# Patient Record
Sex: Female | Born: 1940
Health system: Southern US, Community
[De-identification: ages and names within clinical notes are randomized; demographics above are authoritative.]

## PROBLEM LIST (undated history)

## (undated) DIAGNOSIS — I82409 Acute embolism and thrombosis of unspecified deep veins of unspecified lower extremity: Secondary | ICD-10-CM

## (undated) DIAGNOSIS — Z9889 Other specified postprocedural states: Secondary | ICD-10-CM

## (undated) DIAGNOSIS — R600 Localized edema: Secondary | ICD-10-CM

## (undated) DIAGNOSIS — M810 Age-related osteoporosis without current pathological fracture: Secondary | ICD-10-CM

## (undated) DIAGNOSIS — I10 Essential (primary) hypertension: Secondary | ICD-10-CM

## (undated) DIAGNOSIS — E559 Vitamin D deficiency, unspecified: Secondary | ICD-10-CM

## (undated) DIAGNOSIS — H269 Unspecified cataract: Secondary | ICD-10-CM

## (undated) DIAGNOSIS — F419 Anxiety disorder, unspecified: Secondary | ICD-10-CM

## (undated) HISTORY — DX: Acute embolism and thrombosis of unspecified deep veins of unspecified lower extremity: I82.409

## (undated) HISTORY — PX: CATARACT EXTRACTION: SUR2

## (undated) HISTORY — DX: Vitamin D deficiency, unspecified: E55.9

## (undated) HISTORY — DX: Unspecified cataract: H26.9

## (undated) HISTORY — PX: ABDOMINAL HYSTERECTOMY: SHX81

## (undated) HISTORY — DX: Anxiety disorder, unspecified: F41.9

## (undated) HISTORY — DX: Essential (primary) hypertension: I10

## (undated) HISTORY — DX: Localized edema: R60.0

## (undated) HISTORY — DX: Age-related osteoporosis without current pathological fracture: M81.0

---

## 1998-04-07 ENCOUNTER — Emergency Department (HOSPITAL_COMMUNITY): Admission: EM | Admit: 1998-04-07 | Discharge: 1998-04-07 | Payer: Self-pay | Admitting: Emergency Medicine

## 1998-10-08 ENCOUNTER — Other Ambulatory Visit: Admission: RE | Admit: 1998-10-08 | Discharge: 1998-10-08 | Payer: Self-pay | Admitting: Obstetrics and Gynecology

## 1998-12-06 ENCOUNTER — Ambulatory Visit (HOSPITAL_COMMUNITY): Admission: RE | Admit: 1998-12-06 | Discharge: 1998-12-06 | Payer: Self-pay | Admitting: Internal Medicine

## 1998-12-06 ENCOUNTER — Encounter: Payer: Self-pay | Admitting: Internal Medicine

## 1998-12-28 ENCOUNTER — Ambulatory Visit (HOSPITAL_COMMUNITY): Admission: RE | Admit: 1998-12-28 | Discharge: 1998-12-28 | Payer: Self-pay | Admitting: Internal Medicine

## 1998-12-28 ENCOUNTER — Encounter: Payer: Self-pay | Admitting: Internal Medicine

## 1999-10-07 ENCOUNTER — Other Ambulatory Visit: Admission: RE | Admit: 1999-10-07 | Discharge: 1999-10-07 | Payer: Self-pay | Admitting: Obstetrics and Gynecology

## 2000-02-13 ENCOUNTER — Encounter: Payer: Self-pay | Admitting: Obstetrics and Gynecology

## 2000-02-13 ENCOUNTER — Encounter: Admission: RE | Admit: 2000-02-13 | Discharge: 2000-02-13 | Payer: Self-pay | Admitting: Obstetrics and Gynecology

## 2000-04-22 ENCOUNTER — Encounter: Payer: Self-pay | Admitting: Emergency Medicine

## 2000-04-22 ENCOUNTER — Emergency Department (HOSPITAL_COMMUNITY): Admission: EM | Admit: 2000-04-22 | Discharge: 2000-04-22 | Payer: Self-pay | Admitting: Emergency Medicine

## 2001-01-19 ENCOUNTER — Encounter: Admission: RE | Admit: 2001-01-19 | Discharge: 2001-01-19 | Payer: Self-pay | Admitting: Obstetrics & Gynecology

## 2001-02-02 ENCOUNTER — Encounter: Admission: RE | Admit: 2001-02-02 | Discharge: 2001-02-02 | Payer: Self-pay | Admitting: Hematology and Oncology

## 2001-02-15 ENCOUNTER — Ambulatory Visit (HOSPITAL_COMMUNITY): Admission: RE | Admit: 2001-02-15 | Discharge: 2001-02-15 | Payer: Self-pay | Admitting: Obstetrics & Gynecology

## 2001-02-22 ENCOUNTER — Encounter: Admission: RE | Admit: 2001-02-22 | Discharge: 2001-02-22 | Payer: Self-pay | Admitting: Internal Medicine

## 2001-03-16 ENCOUNTER — Encounter: Admission: RE | Admit: 2001-03-16 | Discharge: 2001-03-16 | Payer: Self-pay | Admitting: Internal Medicine

## 2001-04-27 ENCOUNTER — Encounter: Admission: RE | Admit: 2001-04-27 | Discharge: 2001-04-27 | Payer: Self-pay | Admitting: Internal Medicine

## 2001-05-27 ENCOUNTER — Encounter: Admission: RE | Admit: 2001-05-27 | Discharge: 2001-05-27 | Payer: Self-pay | Admitting: Internal Medicine

## 2001-06-24 ENCOUNTER — Encounter: Admission: RE | Admit: 2001-06-24 | Discharge: 2001-06-24 | Payer: Self-pay | Admitting: Internal Medicine

## 2001-08-24 ENCOUNTER — Encounter: Admission: RE | Admit: 2001-08-24 | Discharge: 2001-08-24 | Payer: Self-pay | Admitting: Internal Medicine

## 2001-12-23 ENCOUNTER — Encounter: Payer: Self-pay | Admitting: Internal Medicine

## 2001-12-23 ENCOUNTER — Ambulatory Visit (HOSPITAL_COMMUNITY): Admission: RE | Admit: 2001-12-23 | Discharge: 2001-12-23 | Payer: Self-pay | Admitting: Internal Medicine

## 2001-12-23 ENCOUNTER — Encounter: Admission: RE | Admit: 2001-12-23 | Discharge: 2001-12-23 | Payer: Self-pay | Admitting: *Deleted

## 2002-03-15 ENCOUNTER — Encounter: Admission: RE | Admit: 2002-03-15 | Discharge: 2002-03-15 | Payer: Self-pay | Admitting: Internal Medicine

## 2002-03-24 ENCOUNTER — Ambulatory Visit (HOSPITAL_COMMUNITY): Admission: RE | Admit: 2002-03-24 | Discharge: 2002-03-24 | Payer: Self-pay | Admitting: Obstetrics

## 2002-05-23 ENCOUNTER — Encounter: Admission: RE | Admit: 2002-05-23 | Discharge: 2002-05-23 | Payer: Self-pay | Admitting: Internal Medicine

## 2004-09-18 ENCOUNTER — Ambulatory Visit: Admission: RE | Admit: 2004-09-18 | Discharge: 2004-09-18 | Payer: Self-pay | Admitting: Endocrinology

## 2004-09-18 ENCOUNTER — Ambulatory Visit: Payer: Self-pay | Admitting: Endocrinology

## 2004-09-30 ENCOUNTER — Ambulatory Visit: Payer: Self-pay | Admitting: Internal Medicine

## 2004-10-17 ENCOUNTER — Ambulatory Visit: Payer: Self-pay | Admitting: Internal Medicine

## 2004-12-23 ENCOUNTER — Ambulatory Visit: Payer: Self-pay | Admitting: Internal Medicine

## 2007-09-09 ENCOUNTER — Telehealth: Payer: Self-pay | Admitting: Internal Medicine

## 2007-12-16 ENCOUNTER — Encounter: Payer: Self-pay | Admitting: Internal Medicine

## 2007-12-16 DIAGNOSIS — I10 Essential (primary) hypertension: Secondary | ICD-10-CM | POA: Insufficient documentation

## 2007-12-16 DIAGNOSIS — R51 Headache: Secondary | ICD-10-CM | POA: Insufficient documentation

## 2007-12-16 DIAGNOSIS — R519 Headache, unspecified: Secondary | ICD-10-CM | POA: Insufficient documentation

## 2007-12-17 ENCOUNTER — Ambulatory Visit: Payer: Self-pay | Admitting: Internal Medicine

## 2007-12-17 LAB — CONVERTED CEMR LAB
Creatinine, Ser: 0.9 mg/dL (ref 0.4–1.2)
Potassium: 3.9 meq/L (ref 3.5–5.1)

## 2007-12-21 ENCOUNTER — Encounter: Payer: Self-pay | Admitting: Internal Medicine

## 2008-04-11 ENCOUNTER — Emergency Department (HOSPITAL_COMMUNITY): Admission: EM | Admit: 2008-04-11 | Discharge: 2008-04-12 | Payer: Self-pay | Admitting: Emergency Medicine

## 2008-04-24 ENCOUNTER — Ambulatory Visit: Payer: Self-pay | Admitting: Internal Medicine

## 2008-04-24 DIAGNOSIS — I471 Supraventricular tachycardia: Secondary | ICD-10-CM | POA: Insufficient documentation

## 2008-04-24 DIAGNOSIS — R7989 Other specified abnormal findings of blood chemistry: Secondary | ICD-10-CM | POA: Insufficient documentation

## 2008-08-28 ENCOUNTER — Ambulatory Visit: Payer: Self-pay | Admitting: Internal Medicine

## 2008-08-28 DIAGNOSIS — M810 Age-related osteoporosis without current pathological fracture: Secondary | ICD-10-CM | POA: Insufficient documentation

## 2008-08-28 DIAGNOSIS — M1990 Unspecified osteoarthritis, unspecified site: Secondary | ICD-10-CM | POA: Insufficient documentation

## 2008-08-28 DIAGNOSIS — J309 Allergic rhinitis, unspecified: Secondary | ICD-10-CM | POA: Insufficient documentation

## 2008-08-29 ENCOUNTER — Encounter (INDEPENDENT_AMBULATORY_CARE_PROVIDER_SITE_OTHER): Payer: Self-pay | Admitting: Internal Medicine

## 2008-08-30 ENCOUNTER — Encounter (INDEPENDENT_AMBULATORY_CARE_PROVIDER_SITE_OTHER): Payer: Self-pay | Admitting: Internal Medicine

## 2008-08-31 LAB — CONVERTED CEMR LAB
ALT: 17 units/L (ref 0–35)
AST: 17 units/L (ref 0–37)
CO2: 24 meq/L (ref 19–32)
Calcium: 9.7 mg/dL (ref 8.4–10.5)
Chloride: 102 meq/L (ref 96–112)
Cholesterol: 216 mg/dL — ABNORMAL HIGH (ref 0–200)
Creatinine, Ser: 0.9 mg/dL (ref 0.40–1.20)
Potassium: 4 meq/L (ref 3.5–5.3)
Sodium: 141 meq/L (ref 135–145)
Total CHOL/HDL Ratio: 4.5
Total Protein: 7.3 g/dL (ref 6.0–8.3)

## 2008-09-01 ENCOUNTER — Ambulatory Visit (HOSPITAL_COMMUNITY): Admission: RE | Admit: 2008-09-01 | Discharge: 2008-09-01 | Payer: Self-pay | Admitting: Internal Medicine

## 2008-09-01 ENCOUNTER — Encounter (INDEPENDENT_AMBULATORY_CARE_PROVIDER_SITE_OTHER): Payer: Self-pay | Admitting: Internal Medicine

## 2008-09-08 ENCOUNTER — Ambulatory Visit: Payer: Self-pay | Admitting: Internal Medicine

## 2008-10-10 ENCOUNTER — Ambulatory Visit (HOSPITAL_COMMUNITY): Admission: RE | Admit: 2008-10-10 | Discharge: 2008-10-10 | Payer: Self-pay | Admitting: Internal Medicine

## 2008-10-10 ENCOUNTER — Ambulatory Visit: Payer: Self-pay | Admitting: Internal Medicine

## 2008-10-10 HISTORY — PX: COLONOSCOPY: SHX174

## 2008-10-11 ENCOUNTER — Ambulatory Visit (HOSPITAL_COMMUNITY): Admission: RE | Admit: 2008-10-11 | Discharge: 2008-10-11 | Payer: Self-pay | Admitting: Internal Medicine

## 2009-01-18 ENCOUNTER — Telehealth (INDEPENDENT_AMBULATORY_CARE_PROVIDER_SITE_OTHER): Payer: Self-pay | Admitting: Internal Medicine

## 2009-01-18 ENCOUNTER — Emergency Department (HOSPITAL_COMMUNITY): Admission: EM | Admit: 2009-01-18 | Discharge: 2009-01-18 | Payer: Self-pay | Admitting: Emergency Medicine

## 2009-05-17 ENCOUNTER — Telehealth (INDEPENDENT_AMBULATORY_CARE_PROVIDER_SITE_OTHER): Payer: Self-pay | Admitting: *Deleted

## 2009-05-18 ENCOUNTER — Ambulatory Visit: Payer: Self-pay | Admitting: Internal Medicine

## 2009-05-18 DIAGNOSIS — E785 Hyperlipidemia, unspecified: Secondary | ICD-10-CM | POA: Insufficient documentation

## 2009-05-18 DIAGNOSIS — M542 Cervicalgia: Secondary | ICD-10-CM | POA: Insufficient documentation

## 2009-05-18 DIAGNOSIS — F411 Generalized anxiety disorder: Secondary | ICD-10-CM | POA: Insufficient documentation

## 2009-05-23 LAB — CONVERTED CEMR LAB
Chloride: 104 meq/L (ref 96–112)
Cholesterol: 216 mg/dL — ABNORMAL HIGH (ref 0–200)
Glucose, Bld: 89 mg/dL (ref 70–99)
LDL Cholesterol: 147 mg/dL — ABNORMAL HIGH (ref 0–99)
Potassium: 3.8 meq/L (ref 3.5–5.3)
Sodium: 142 meq/L (ref 135–145)
Total CHOL/HDL Ratio: 4.9
Triglycerides: 125 mg/dL (ref <150)
VLDL: 25 mg/dL (ref 0–40)

## 2009-07-04 ENCOUNTER — Encounter (INDEPENDENT_AMBULATORY_CARE_PROVIDER_SITE_OTHER): Payer: Self-pay | Admitting: Internal Medicine

## 2009-09-10 ENCOUNTER — Ambulatory Visit (HOSPITAL_COMMUNITY): Admission: RE | Admit: 2009-09-10 | Discharge: 2009-09-10 | Payer: Self-pay | Admitting: Internal Medicine

## 2010-09-02 ENCOUNTER — Ambulatory Visit (HOSPITAL_COMMUNITY): Admission: RE | Admit: 2010-09-02 | Discharge: 2010-09-02 | Payer: Self-pay | Admitting: Family Medicine

## 2010-09-12 ENCOUNTER — Ambulatory Visit (HOSPITAL_COMMUNITY): Admission: RE | Admit: 2010-09-12 | Discharge: 2010-09-12 | Payer: Self-pay | Admitting: Family Medicine

## 2010-11-17 ENCOUNTER — Encounter: Payer: Self-pay | Admitting: Internal Medicine

## 2011-02-06 LAB — BASIC METABOLIC PANEL
BUN: 11 mg/dL (ref 6–23)
Calcium: 9.3 mg/dL (ref 8.4–10.5)
GFR calc non Af Amer: >60 mL/min (ref >60)
Glucose, Bld: 114 mg/dL — ABNORMAL HIGH (ref 70–99)
Sodium: 138 mEq/L (ref 135–145)

## 2011-02-06 LAB — CBC
Platelets: 213 10*3/uL (ref 150–400)
RDW: 13.9 % (ref 11.5–15.5)
WBC: 7.7 10*3/uL (ref 4.0–10.5)

## 2011-02-06 LAB — DIFFERENTIAL
Basophils Absolute: 0 10*3/uL (ref 0.0–0.1)
Lymphocytes Relative: 41 % (ref 12–46)
Lymphs Abs: 3.1 10*3/uL (ref 0.7–4.0)
Neutro Abs: 3.8 10*3/uL (ref 1.7–7.7)

## 2011-02-19 ENCOUNTER — Emergency Department (HOSPITAL_COMMUNITY)
Admission: EM | Admit: 2011-02-19 | Discharge: 2011-02-19 | Disposition: A | Discharge status: Home / Self Care | Payer: Medicare Other | Attending: Emergency Medicine | Admitting: Emergency Medicine

## 2011-02-19 ENCOUNTER — Emergency Department (HOSPITAL_COMMUNITY): Payer: Medicare Other

## 2011-02-19 DIAGNOSIS — IMO0002 Reserved for concepts with insufficient information to code with codable children: Secondary | ICD-10-CM | POA: Insufficient documentation

## 2011-02-19 DIAGNOSIS — I10 Essential (primary) hypertension: Secondary | ICD-10-CM | POA: Insufficient documentation

## 2011-02-19 DIAGNOSIS — T18108A Unspecified foreign body in esophagus causing other injury, initial encounter: Secondary | ICD-10-CM | POA: Insufficient documentation

## 2011-03-11 NOTE — Op Note (Signed)
NAME:  BENNIE, SCAFF               ACCOUNT NO.:  [id number]   MEDICAL RECORD NO.:  [id number]          PATIENT TYPE:  AMB   LOCATION:  DAY                           FACILITY:  [facility]   PHYSICIAN:  R. Roetta Sessions, M.D. DATE OF BIRTH:  Jun 10, 1941   DATE OF PROCEDURE:  10/10/2008  DATE OF DISCHARGE:                               OPERATIVE REPORT   INDICATIONS FOR PROCEDURE:  A 70 year old lady with no lower GI tract  symptoms sent over courtesy, Dr. Jen Mow for screening colonoscopy.  She  has never had her lower GI tract evaluated.  There is no family history  of first relatives with colon cancer.  Colonoscopy is now being done as  screening maneuver.  Risks, benefits, alternatives, and limitations have  been reviewed, questions answered.  Please see the documentation in the  medical record.   PROCEDURE NOTE:  O2 saturation, blood pressure, pulse, respirations were  monitored throughout the entire procedure.   CONSCIOUS SEDATION:  Versed 4 mg IV, Demerol 100 mg IV in divided doses.   INSTRUMENT:  Pentax video chip system.   FINDINGS:  Digital rectal exam revealed no abnormalities.  Endoscopic  Findings:  Prep was good.  Colon:  Colonic mucosa was surveyed from the  rectosigmoid junction well into the left colon, right after that had  some difficulties encountering looping, and poor patient tolerance  throughout the entirety of the procedure on top of looping, Ms. Ho  was very susceptible to bradycardia and her blood pressure did  transiently drop down into the 60s and she got bradycardic in the 40s.  Every time I attempted to advance the scope every time I backed off.  Her vital signs rapidly normalized.  She was having quite a bit of  intolerance to the procedure, although as I encountered looping I backed  off.  I felt it would not be safe to continue attempting advancing to  the cecum and consequently I backed off.  I feel that a good portion of  descending and sigmoid segments  were well seen and appeared entirely  normal.  I pulled the scope down into the rectum.  Thorough examination  of the rectal mucosa including retroflexed view of the anal verge  demonstrated no abnormalities.  The patient was stable and doing fine at  the conclusion of the procedure.   IMPRESSION:  Incomplete exam as described above.   Recommendations will complement today's incomplete exam with near  contrast barium enema at a later date.      Jonathon Bellows, M.D.  Electronically Signed    RMR/MEDQ  D:  10/10/2008  T:  10/10/2008  Job:  [id number]   cc:   Erle Crocker, M.D.

## 2011-05-14 ENCOUNTER — Ambulatory Visit
Admission: RE | Admit: 2011-05-14 | Discharge: 2011-05-14 | Disposition: A | Discharge status: Home / Self Care | Payer: Medicare Other | Source: Ambulatory Visit | Attending: Physician Assistant | Admitting: Physician Assistant

## 2011-05-14 ENCOUNTER — Ambulatory Visit
Admission: RE | Admit: 2011-05-14 | Discharge: 2011-05-14 | Disposition: A | Discharge status: Home / Self Care | Payer: Medicare Other | Source: Ambulatory Visit | Attending: Family Medicine | Admitting: Family Medicine

## 2011-05-14 ENCOUNTER — Other Ambulatory Visit: Payer: Self-pay | Admitting: Family Medicine

## 2011-05-14 ENCOUNTER — Other Ambulatory Visit: Payer: Self-pay | Admitting: Physician Assistant

## 2011-05-14 DIAGNOSIS — R609 Edema, unspecified: Secondary | ICD-10-CM

## 2011-05-14 DIAGNOSIS — R52 Pain, unspecified: Secondary | ICD-10-CM

## 2011-07-24 LAB — DIFFERENTIAL
Eosinophils Absolute: 0.1
Lymphs Abs: 5 — ABNORMAL HIGH
Monocytes Absolute: 0.9
Monocytes Relative: 8
Neutrophils Relative %: 47

## 2011-07-24 LAB — CBC
HCT: 37.4
Hemoglobin: 13
MCV: 88.1
RBC: 4.25
WBC: 11.6 — ABNORMAL HIGH

## 2011-07-24 LAB — BASIC METABOLIC PANEL
Chloride: 102
GFR calc Af Amer: 52 — ABNORMAL LOW
Potassium: 2.6 — CL
Sodium: 137

## 2011-07-24 LAB — POCT CARDIAC MARKERS: Myoglobin, poc: 18.1

## 2011-08-07 ENCOUNTER — Other Ambulatory Visit: Payer: Self-pay | Admitting: Family Medicine

## 2011-08-07 DIAGNOSIS — Z139 Encounter for screening, unspecified: Secondary | ICD-10-CM

## 2011-09-15 ENCOUNTER — Ambulatory Visit (HOSPITAL_COMMUNITY): Payer: Medicare Other

## 2011-09-15 ENCOUNTER — Ambulatory Visit (HOSPITAL_COMMUNITY)
Admission: RE | Admit: 2011-09-15 | Discharge: 2011-09-15 | Disposition: A | Discharge status: Home / Self Care | Payer: Medicare Other | Source: Ambulatory Visit | Attending: Family Medicine | Admitting: Family Medicine

## 2011-09-15 DIAGNOSIS — Z1231 Encounter for screening mammogram for malignant neoplasm of breast: Secondary | ICD-10-CM | POA: Insufficient documentation

## 2011-09-15 DIAGNOSIS — Z139 Encounter for screening, unspecified: Secondary | ICD-10-CM

## 2011-12-17 ENCOUNTER — Other Ambulatory Visit: Payer: Self-pay | Admitting: Physician Assistant

## 2011-12-17 DIAGNOSIS — R2230 Localized swelling, mass and lump, unspecified upper limb: Secondary | ICD-10-CM

## 2011-12-31 ENCOUNTER — Ambulatory Visit (HOSPITAL_COMMUNITY)
Admission: RE | Admit: 2011-12-31 | Discharge: 2011-12-31 | Disposition: A | Discharge status: Home / Self Care | Payer: Medicare Other | Source: Ambulatory Visit | Attending: Physician Assistant | Admitting: Physician Assistant

## 2011-12-31 DIAGNOSIS — N644 Mastodynia: Secondary | ICD-10-CM | POA: Insufficient documentation

## 2011-12-31 DIAGNOSIS — N63 Unspecified lump in unspecified breast: Secondary | ICD-10-CM | POA: Insufficient documentation

## 2011-12-31 DIAGNOSIS — R2230 Localized swelling, mass and lump, unspecified upper limb: Secondary | ICD-10-CM

## 2012-06-30 ENCOUNTER — Other Ambulatory Visit: Payer: Self-pay | Admitting: Family Medicine

## 2012-06-30 DIAGNOSIS — M810 Age-related osteoporosis without current pathological fracture: Secondary | ICD-10-CM

## 2012-08-31 ENCOUNTER — Ambulatory Visit (HOSPITAL_COMMUNITY): Payer: Medicare Other

## 2012-08-31 ENCOUNTER — Other Ambulatory Visit (HOSPITAL_COMMUNITY): Payer: Medicare Other

## 2012-09-16 ENCOUNTER — Ambulatory Visit (HOSPITAL_COMMUNITY)
Admission: RE | Admit: 2012-09-16 | Discharge: 2012-09-16 | Disposition: A | Discharge status: Home / Self Care | Payer: Medicare Other | Source: Ambulatory Visit | Attending: Family Medicine | Admitting: Family Medicine

## 2012-09-16 DIAGNOSIS — M810 Age-related osteoporosis without current pathological fracture: Secondary | ICD-10-CM

## 2012-09-16 DIAGNOSIS — Z1231 Encounter for screening mammogram for malignant neoplasm of breast: Secondary | ICD-10-CM | POA: Insufficient documentation

## 2012-09-16 LAB — HM MAMMOGRAPHY

## 2012-09-16 LAB — HM DEXA SCAN

## 2012-12-25 ENCOUNTER — Encounter: Payer: Self-pay | Admitting: *Deleted

## 2012-12-25 DIAGNOSIS — I10 Essential (primary) hypertension: Secondary | ICD-10-CM | POA: Insufficient documentation

## 2012-12-25 DIAGNOSIS — F419 Anxiety disorder, unspecified: Secondary | ICD-10-CM | POA: Insufficient documentation

## 2012-12-25 DIAGNOSIS — H269 Unspecified cataract: Secondary | ICD-10-CM | POA: Insufficient documentation

## 2012-12-25 DIAGNOSIS — M810 Age-related osteoporosis without current pathological fracture: Secondary | ICD-10-CM | POA: Insufficient documentation

## 2013-03-22 ENCOUNTER — Encounter: Payer: Self-pay | Admitting: Physician Assistant

## 2013-06-16 ENCOUNTER — Encounter: Payer: Self-pay | Admitting: Physician Assistant

## 2013-06-16 ENCOUNTER — Ambulatory Visit (INDEPENDENT_AMBULATORY_CARE_PROVIDER_SITE_OTHER): Payer: Medicare Other | Admitting: Physician Assistant

## 2013-06-16 VITALS — BP 124/80 | HR 54 | Temp 97.1°F | Resp 16 | Ht 66.25 in | Wt 170.0 lb

## 2013-06-16 DIAGNOSIS — F411 Generalized anxiety disorder: Secondary | ICD-10-CM

## 2013-06-16 DIAGNOSIS — M1990 Unspecified osteoarthritis, unspecified site: Secondary | ICD-10-CM

## 2013-06-16 DIAGNOSIS — I10 Essential (primary) hypertension: Secondary | ICD-10-CM

## 2013-06-16 DIAGNOSIS — M810 Age-related osteoporosis without current pathological fracture: Secondary | ICD-10-CM

## 2013-06-16 DIAGNOSIS — E559 Vitamin D deficiency, unspecified: Secondary | ICD-10-CM | POA: Insufficient documentation

## 2013-06-16 LAB — COMPLETE METABOLIC PANEL WITH GFR
AST: 14 U/L (ref 0–37)
BUN: 16 mg/dL (ref 6–23)
Calcium: 9.4 mg/dL (ref 8.4–10.5)
Chloride: 105 mEq/L (ref 96–112)
Creat: 0.89 mg/dL (ref 0.50–1.10)
GFR, Est African American: 76 mL/min
Total Bilirubin: 0.6 mg/dL (ref 0.3–1.2)

## 2013-06-16 NOTE — Progress Notes (Signed)
Patient ID: KASUMI DITULLIO MRN: [id number], DOB: [birth year], 72 y.o. Date of Encounter: @DATE @  Chief Complaint:  Chief Complaint  Patient presents with  . Follow-up    HPI: 72 y.o. year old white female  presents 4 routine followup office visit.  Osteoporosis: She is currently still taking the Fosamax weekly and also taking calcium and vitamin D. She still is having the jaw pain and no thigh pain.  Generalized anxiety disorder: He is still on Celexa for this. This is still working very well for her in controlling her symptoms. She is having no adverse effects. She says that she has "always been a very nervous person" but the Celexa controls this very well.  She has no complaints today. Even with exertion she has no chest pressure tightness or heaviness and no increased work of breath/dyspnea on exertion.   Past Medical History  Diagnosis Date  . Hypertension   . Anxiety   . Osteoporosis   . Edema of both legs     at end of day  . Cataract   . Vitamin D deficiency      Home Meds: See attached medication section for current medication list. Any medications entered into computer today will not appear on this note's list. The medications listed below were entered prior to today. Current Outpatient Prescriptions on File Prior to Visit  Medication Sig Dispense Refill  . alendronate (FOSAMAX) 10 MG tablet Take 10 mg by mouth once a week. Take with a full glass of water on an empty stomach.      . calcium carbonate (OS-CAL) 600 MG TABS Take 600 mg by mouth 2 (two) times daily with a meal.      . cholecalciferol (VITAMIN D) 1000 UNITS tablet Take 2,000 Units by mouth daily.      . citalopram (CELEXA) 20 MG tablet Take 20 mg by mouth daily.      . hydrochlorothiazide (HYDRODIURIL) 25 MG tablet Take 25 mg by mouth daily.       No current facility-administered medications on file prior to visit.    Allergies: No Known Allergies  History   Social History  . Marital Status:  Married    Spouse Name: N/A    Number of Children: N/A  . Years of Education: N/A   Occupational History  . Not on file.   Social History Main Topics  . Smoking status: Never Smoker   . Smokeless tobacco: Not on file  . Alcohol Use: No  . Drug Use: Not on file  . Sexual Activity: Not on file   Other Topics Concern  . Not on file   Social History Narrative  . No narrative on file    Family History  Problem Relation Age of Onset  . Cancer Mother     Breast  . Heart disease Father 48    CABG  . Cancer Sister 47    Lung  . Cancer Brother 70     Review of Systems:  See HPI for pertinent ROS. All other ROS negative.    Physical Exam: Blood pressure 124/80, pulse 54, temperature 97.1 F (36.2 C), resp. rate 16, height 5' 6.25" (1.683 m), weight 170 lb (77.111 kg)., Body mass index is 27.22 kg/(m^2). General:well-nourished well-developed white female Appears in no acute distress. Neck: Supple. No thyromegaly. No lymphadenopathy.no carotid bruit. Lungs: Clear bilaterally to auscultation without wheezes, rales, or rhonchi. Breathing is unlabored. Heart: RRR with S1 S2. No murmurs, rubs, or gallops. Abdomen: Soft,  non-tender, non-distended with normoactive bowel sounds. No hepatomegaly. No rebound/guarding. No obvious abdominal masses. Musculoskeletal:  Strength and tone normal for age. Extremities/Skin: Warm and dry. No clubbing or cyanosis. No edema. No rashes or suspicious lesions. Neuro: Alert and oriented X 3. Moves all extremities spontaneously. Gait is normal. CNII-XII grossly in tact. Psych:  Responds to questions appropriately with a normal affect.     ASSESSMENT AND PLAN:  72 y.o. year old female with  1. ANXIETY Controlled continue current dose of Celexa. - COMPLETE METABOLIC PANEL WITH GFR  2. HYPERTENSION At goal continue current medication. - COMPLETE METABOLIC PANEL WITH GFR  3. POSTMENOPAUSAL OSTEOPOROSIS Discussed the studies regarding Fosamax.  Discussed that the medication should be stopped after being on the therapy for 5 years. She started Fosamax in November 2009. This means this needs to be conducted discontinued November 2014. She says that she has 2 months of medication remaining that she is always already purchased. She is quite willing to complete these and I would not pick up any further refills of her Fosamax. Her last DEXA scan was in November 2013 which was improved.   4. OSTEOARTHRITIS   5. Osteoporosis  - Vit D  25 hydroxy (rtn osteoporosis monitoring)  6. Vitamin D deficiency 1 2000 international units daily. - Vit D  25 hydroxy (rtn osteoporosis monitoring)  #7 mammogram patient reports that she had this done November 2013 and was normal.  #8 colonoscopy per patient she had this in approximately 2010 by Dr. Work and was told to followup in 10 years  #9 screening labs: Performed August 2011 with favorable FLP  #10 immunizations A T. With insurance the cost would be $40. She defers B. Pneumovax given 06/16/2012 Zostavax even with insurance her cost is too expensive and she defers this.   [address] [place], [place], [facility] 06/16/2013 5:35 PM

## 2013-06-17 ENCOUNTER — Ambulatory Visit: Payer: Self-pay | Admitting: Physician Assistant

## 2013-06-17 LAB — VITAMIN D 25 HYDROXY (VIT D DEFICIENCY, FRACTURES): Vit D, 25-Hydroxy: 63 ng/mL (ref 30–89)

## 2013-08-17 ENCOUNTER — Other Ambulatory Visit: Payer: Self-pay | Admitting: Physician Assistant

## 2013-08-17 DIAGNOSIS — Z139 Encounter for screening, unspecified: Secondary | ICD-10-CM

## 2013-09-19 ENCOUNTER — Ambulatory Visit (INDEPENDENT_AMBULATORY_CARE_PROVIDER_SITE_OTHER): Payer: Medicare Other | Admitting: Family Medicine

## 2013-09-19 DIAGNOSIS — Z23 Encounter for immunization: Secondary | ICD-10-CM

## 2013-09-20 ENCOUNTER — Ambulatory Visit (HOSPITAL_COMMUNITY)
Admission: RE | Admit: 2013-09-20 | Discharge: 2013-09-20 | Disposition: A | Discharge status: Home / Self Care | Payer: Medicare Other | Source: Ambulatory Visit | Attending: Physician Assistant | Admitting: Physician Assistant

## 2013-09-20 DIAGNOSIS — Z1231 Encounter for screening mammogram for malignant neoplasm of breast: Secondary | ICD-10-CM | POA: Insufficient documentation

## 2013-09-20 DIAGNOSIS — Z139 Encounter for screening, unspecified: Secondary | ICD-10-CM

## 2013-11-08 ENCOUNTER — Encounter: Payer: Self-pay | Admitting: Physician Assistant

## 2013-11-08 ENCOUNTER — Ambulatory Visit (INDEPENDENT_AMBULATORY_CARE_PROVIDER_SITE_OTHER): Payer: Medicare Other | Admitting: Physician Assistant

## 2013-11-08 VITALS — BP 152/96 | HR 52 | Temp 98.1°F | Resp 18 | Wt 172.0 lb

## 2013-11-08 DIAGNOSIS — B9689 Other specified bacterial agents as the cause of diseases classified elsewhere: Secondary | ICD-10-CM

## 2013-11-08 DIAGNOSIS — J069 Acute upper respiratory infection, unspecified: Secondary | ICD-10-CM

## 2013-11-08 DIAGNOSIS — A499 Bacterial infection, unspecified: Secondary | ICD-10-CM

## 2013-11-08 MED ORDER — AMOXICILLIN-POT CLAVULANATE 875-125 MG PO TABS: 1.0000 | ORAL_TABLET | Freq: Two times a day (BID) | ORAL | Status: DC

## 2013-11-08 NOTE — Progress Notes (Signed)
    Patient ID: Wendy BeadyBarbara S Dargan MRN: [id number], DOB: [birth year], 73 y.o. Date of Encounter: 11/08/2013, 3:28 PM    Chief Complaint:  Chief Complaint  Patient presents with  . c/o sinus infection    x 5 days     HPI: 73 y.o. year old white female presents with above symptoms. Says she has had nasal and sinus congestion for 5 or 6 days now. Sometimes able to get out some mucus. Occasionally feels some discomfort into the ears. No sore throat. No chest congestion or cough. No fevers or chills.     Home Meds: See attached medication section for any medications that were entered at today's visit. The computer does not put those onto this list.The following list is a list of meds entered prior to today's visit.   Current Outpatient Prescriptions on File Prior to Visit  Medication Sig Dispense Refill  . calcium carbonate (OS-CAL) 600 MG TABS Take 600 mg by mouth 2 (two) times daily with a meal.      . cholecalciferol (VITAMIN D) 1000 UNITS tablet Take 2,000 Units by mouth daily.      . citalopram (CELEXA) 20 MG tablet Take 20 mg by mouth daily.      . hydrochlorothiazide (HYDRODIURIL) 25 MG tablet Take 25 mg by mouth daily.       No current facility-administered medications on file prior to visit.    Allergies: No Known Allergies    Review of Systems: See HPI for pertinent ROS. All other ROS negative.    Physical Exam: Blood pressure 152/96, pulse 52, temperature 98.1 F (36.7 C), temperature source Oral, resp. rate 18, weight 172 lb (78.019 kg)., Body mass index is 27.54 kg/(m^2). General: WNWD WF.  Appears in no acute distress. HEENT: Normocephalic, atraumatic, eyes without discharge, sclera non-icteric, nares are without discharge. Bilateral auditory canals clear, TM's are without perforation, pearly grey and translucent with reflective cone of light bilaterally. Oral cavity moist, posterior pharynx without exudate, erythema, peritonsillar abscess. Frontal and maxillary sinuses  with minimal tenderness with percussion.  Neck: Supple. No thyromegaly. No lymphadenopathy. Lungs: Clear bilaterally to auscultation without wheezes, rales, or rhonchi. Breathing is unlabored. Heart: Regular rhythm. No murmurs, rubs, or gallops. Msk:  Strength and tone normal for age. Extremities/Skin: Warm and dry. No clubbing or cyanosis. No edema. No rashes or suspicious lesions. Neuro: Alert and oriented X 3. Moves all extremities spontaneously. Gait is normal. CNII-XII grossly in tact. Psych:  Responds to questions appropriately with a normal affect.     ASSESSMENT AND PLAN:  73 y.o. year old female with  1. Bacterial upper respiratory infection - amoxicillin-clavulanate (AUGMENTIN) 875-125 MG per tablet; Take 1 tablet by mouth 2 (two) times daily.  Dispense: 20 tablet; Refill: 0 She is to complete all of antibiotic. If symptoms do not resolve after completion of antibiotic and followup. She can use over-the-counter medications as needed for symptom relief in the meantime.  Murray HodgkinsSigned,  Beth GoliadDixon, [place], [facility] 11/08/2013 3:28 PM

## 2013-12-19 ENCOUNTER — Encounter: Payer: Self-pay | Admitting: Physician Assistant

## 2013-12-19 ENCOUNTER — Ambulatory Visit (INDEPENDENT_AMBULATORY_CARE_PROVIDER_SITE_OTHER): Payer: Medicare Other | Admitting: Physician Assistant

## 2013-12-19 VITALS — BP 104/70 | HR 64 | Temp 98.2°F | Resp 18 | Ht 64.75 in | Wt 172.0 lb

## 2013-12-19 DIAGNOSIS — F411 Generalized anxiety disorder: Secondary | ICD-10-CM

## 2013-12-19 DIAGNOSIS — M810 Age-related osteoporosis without current pathological fracture: Secondary | ICD-10-CM

## 2013-12-19 DIAGNOSIS — E559 Vitamin D deficiency, unspecified: Secondary | ICD-10-CM

## 2013-12-19 DIAGNOSIS — I10 Essential (primary) hypertension: Secondary | ICD-10-CM

## 2013-12-19 LAB — BASIC METABOLIC PANEL WITH GFR
BUN: 18 mg/dL (ref 6–23)
CHLORIDE: 102 meq/L (ref 96–112)
CO2: 28 mEq/L (ref 19–32)
CREATININE: 0.94 mg/dL (ref 0.50–1.10)
Calcium: 9.5 mg/dL (ref 8.4–10.5)
GFR, EST NON AFRICAN AMERICAN: 62 mL/min
GFR, Est African American: 71 mL/min
GLUCOSE: 88 mg/dL (ref 70–99)
Potassium: 4.4 mEq/L (ref 3.5–5.3)
Sodium: 141 mEq/L (ref 135–145)

## 2013-12-19 NOTE — Progress Notes (Signed)
Patient ID: Wendy Newman MRN: [id number], DOB: May 30, 1941, 73 y.o. Date of Encounter: @DATE @  Chief Complaint:  Chief Complaint  Patient presents with  . 6 mth follow up    is fasting    HPI: 73 y.o. year old female  presents routine followup office visit.  At her last visit in 05/2013 we discussed stopping Fosamax that she had been on it for 5 years. She states that she did stop that medicine. She is taking her calcium and vitamin D.  She reports that the Celexa is still working well for her and is controlling her anxiety. Is having no adverse effects. Says that she has "always been a very nervous person" but the Celexa controls this well. Does mentioned today that her 71 year old grandson is living with her and her husband now. says he was wanting to drop out of school and is living with them currently  She has no complaints. Even with exertion she still has no chest pressure tightness or heaviness in no increased shortness of breath or dyspnea.   Past Medical History  Diagnosis Date  . Hypertension   . Anxiety   . Osteoporosis   . Edema of both legs     at end of day  . Cataract   . Vitamin D deficiency      Home Meds: See attached medication section for current medication list. Any medications entered into computer today will not appear on this note's list. The medications listed below were entered prior to today. Current Outpatient Prescriptions on File Prior to Visit  Medication Sig Dispense Refill  . calcium carbonate (OS-CAL) 600 MG TABS Take 600 mg by mouth 2 (two) times daily with a meal.      . cholecalciferol (VITAMIN D) 1000 UNITS tablet Take 2,000 Units by mouth daily.      . citalopram (CELEXA) 20 MG tablet Take 20 mg by mouth daily.      . hydrochlorothiazide (HYDRODIURIL) 25 MG tablet Take 25 mg by mouth daily.       No current facility-administered medications on file prior to visit.    Allergies: No Known Allergies  History   Social History    . Marital Status: Married    Spouse Name: N/A    Number of Children: N/A  . Years of Education: N/A   Occupational History  . Not on file.   Social History Main Topics  . Smoking status: Never Smoker   . Smokeless tobacco: Not on file  . Alcohol Use: No  . Drug Use: Not on file  . Sexual Activity: Not on file   Other Topics Concern  . Not on file   Social History Narrative  . No narrative on file    Family History  Problem Relation Age of Onset  . Cancer Mother     Breast  . Heart disease Father [phone]    CABG  . Cancer Sister [id number]    Lung  . Cancer Brother [id number]     Review of Systems:  See HPI for pertinent ROS. All other ROS negative.    Physical Exam: Blood pressure 104/70, pulse 64, temperature 98.2 F (36.8 C), temperature source Oral, resp. rate 18, height 5' 4.75" (1.645 m), weight 172 lb (78.019 kg)., Body mass index is 28.83 kg/(m^2). General: WNWD WF. Appears in no acute distress. Neck: Supple. No thyromegaly. No lymphadenopathy. No carotid bruit.  Lungs: Clear bilaterally to auscultation without wheezes, rales, or rhonchi. Breathing is unlabored. Heart:  RRR with S1 S2. No murmurs, rubs, or gallops. Abdomen: Soft, non-tender, non-distended with normoactive bowel sounds. No hepatomegaly. No rebound/guarding. No obvious abdominal masses. Musculoskeletal:  Strength and tone normal for age. Extremities/Skin: Warm and dry. No clubbing or cyanosis. No edema. No rashes or suspicious lesions. Neuro: Alert and oriented X 3. Moves all extremities spontaneously. Gait is normal. CNII-XII grossly in tact. Psych:  Responds to questions appropriately with a normal affect.     ASSESSMENT AND PLAN:  73 y.o. year old female with  1. HYPERTENSION Blood pressure is well controlled. Continue current medication. Check labs monitor. - BASIC METABOLIC PANEL WITH GFR  2. ANXIETY Anxiety is controlled with current dose of Celexa. Continue current medication.  3. POSTMENOPAUSAL  OSTEOPOROSIS At her visit 06/16/2013 we discussed that Fosamax should be stopped after being on that therapy for 5 years. She has started Fosamax November 2009. Therefore was do to stop medication November 2014. She did stop the medication at that time. She is still taking calcium and vitamin D.  4. Vitamin D deficiency He is taking vitamin D. We just checked a level 06/16/13 second weight today this lab work.  5. Mammogram: Patient states that she has had this annually.  6. colonoscopy: Patient reports she had this in approximately 2010 by Dr. Kendell Bane and was told followup 10 years.  7. Immunizations: Tetanus: Patient checked regarding cost and he was insurance the cost would be $40 so she has deferred. Pneumonia vaccine: Pneumovax given 06/16/2012.                    SHE IS DUE FOR PREVNAR 13---WILL GIVE THIS AT NEXT OV--- Zostavax: She has checked with her insurance regarding her cost and it is too expensive so she has deferred.   Routine office visit 6 months or sooner if needed.  [address] [place], [place], [facility] 12/19/2013 4:38 PM

## 2014-01-19 ENCOUNTER — Telehealth: Payer: Self-pay | Admitting: Family Medicine

## 2014-01-19 MED ORDER — CITALOPRAM HYDROBROMIDE 20 MG PO TABS: 20.0000 mg | ORAL_TABLET | Freq: Every day | ORAL | Status: DC

## 2014-01-19 MED ORDER — HYDROCHLOROTHIAZIDE 25 MG PO TABS: 25.0000 mg | ORAL_TABLET | Freq: Every day | ORAL | Status: DC

## 2014-01-19 NOTE — Telephone Encounter (Signed)
Med refilled and pt aware  

## 2014-01-19 NOTE — Telephone Encounter (Signed)
Call back number is [phone] Pharmacy [facility] [address] [place] Pt is needing a refill on  Both of her BP medications and her anxiety meds

## 2014-01-19 NOTE — Addendum Note (Signed)
Addended by: Legrand RamsWILLIS, SANDY B on: 01/19/2014 04:09 PM   Modules accepted: Orders

## 2014-06-02 ENCOUNTER — Encounter: Payer: Self-pay | Admitting: Physician Assistant

## 2014-06-19 ENCOUNTER — Ambulatory Visit: Payer: Medicare Other | Admitting: Physician Assistant

## 2014-06-21 ENCOUNTER — Ambulatory Visit (INDEPENDENT_AMBULATORY_CARE_PROVIDER_SITE_OTHER): Payer: Medicare Other | Admitting: Physician Assistant

## 2014-06-21 ENCOUNTER — Encounter: Payer: Self-pay | Admitting: *Deleted

## 2014-06-21 ENCOUNTER — Encounter: Payer: Self-pay | Admitting: Physician Assistant

## 2014-06-21 VITALS — BP 132/76 | HR 64 | Temp 97.9°F | Resp 12 | Ht 65.0 in | Wt 168.0 lb

## 2014-06-21 DIAGNOSIS — M810 Age-related osteoporosis without current pathological fracture: Secondary | ICD-10-CM

## 2014-06-21 DIAGNOSIS — I10 Essential (primary) hypertension: Secondary | ICD-10-CM

## 2014-06-21 DIAGNOSIS — Z23 Encounter for immunization: Secondary | ICD-10-CM

## 2014-06-21 DIAGNOSIS — F411 Generalized anxiety disorder: Secondary | ICD-10-CM

## 2014-06-21 DIAGNOSIS — E559 Vitamin D deficiency, unspecified: Secondary | ICD-10-CM

## 2014-06-21 DIAGNOSIS — F419 Anxiety disorder, unspecified: Secondary | ICD-10-CM

## 2014-06-21 LAB — BASIC METABOLIC PANEL WITH GFR
BUN: 15 mg/dL (ref 6–23)
CALCIUM: 9.3 mg/dL (ref 8.4–10.5)
CO2: 28 meq/L (ref 19–32)
CREATININE: 1 mg/dL (ref 0.50–1.10)
Chloride: 103 mEq/L (ref 96–112)
GFR, Est African American: 66 mL/min
GFR, Est Non African American: 57 mL/min — ABNORMAL LOW
GLUCOSE: 94 mg/dL (ref 70–99)
Potassium: 3.9 mEq/L (ref 3.5–5.3)
Sodium: 140 mEq/L (ref 135–145)

## 2014-06-21 NOTE — Progress Notes (Signed)
Patient ID: Wendy Newman MRN: [id number], DOB: September 08, 1941, 73 y.o. Date of Encounter: @  Chief Complaint:  Chief Complaint  Patient presents with  . 6 month F/U    is fasting    HPI: 73 y.o. year old female  presents routine followup office visit.  At her  visit in 05/2013 we discussed stopping Fosamax that she had been on it for 5 years. At f/u OV she stated that she did stop that medicine. She was taking her calcium and vitamin D. Today-05/2014-she says she is no longer taking the Vitamin D secondary to finances.  Financial distress: I also see her husband as a patient. I had noticed that he had lost a lot of weight. I further evaluated his weight loss. Found out that they were having severe financial distress. He was eating what he could. At followup he has told me that they are now using 3 different food pantries as well as food from the church. She does not make any further comments or provide any further information regarding the financial distress today except for the fact that she cannot afford to buy over-the-counter vitamin D.  She reports that the Celexa is still working well for her and is controlling her anxiety. Is having no adverse effects. Says that she has "always been a very nervous person" but the Celexa controls this well. Does mentioned today that her 66 year old grandson is living with her and her husband now. says he was wanting to drop out of school and is living with them currently. I discussed that the Celexa does come in a higher dose but she says that this is dose works well for her and she does not need to increase the dose. Says that she mostly needs this "for her nerves" but says that this is controlled at this dose.  She has no complaints. Even with exertion she still has no chest pressure tightness or heaviness in no increased shortness of breath or dyspnea.   Past Medical History  Diagnosis Date  . Hypertension   . Anxiety   . Osteoporosis    . Edema of both legs     at end of day  . Cataract   . Vitamin D deficiency      Home Meds:  Outpatient Prescriptions Prior to Visit  Medication Sig Dispense Refill  . calcium carbonate (OS-CAL) 600 MG TABS Take 600 mg by mouth 2 (two) times daily with a meal.      . citalopram (CELEXA) 20 MG tablet Take 1 tablet (20 mg total) by mouth daily.  30 tablet  5  . hydrochlorothiazide (HYDRODIURIL) 25 MG tablet Take 1 tablet (25 mg total) by mouth daily.  30 tablet  5  . cholecalciferol (VITAMIN D) 1000 UNITS tablet Take 2,000 Units by mouth daily.      . fexofenadine (ALLEGRA) 180 MG tablet Take 180 mg by mouth daily.       No facility-administered medications prior to visit.     Allergies: No Known Allergies  History   Social History  . Marital Status: Married    Spouse Name: N/A    Number of Children: N/A  . Years of Education: N/A   Occupational History  . Not on file.   Social History Main Topics  . Smoking status: Never Smoker   . Smokeless tobacco: Not on file  . Alcohol Use: No  . Drug Use: Not on file  . Sexual Activity: Not on file  Other Topics Concern  . Not on file   Social History Narrative  . No narrative on file    Family History  Problem Relation Age of Onset  . Cancer Mother     Breast  . Heart disease Father 58    CABG  . Cancer Sister 34    Lung  . Cancer Brother 78     Review of Systems:  See HPI for pertinent ROS. All other ROS negative.    Physical Exam: Blood pressure 132/76, pulse 64, temperature 97.9 F (36.6 C), temperature source Oral, resp. rate 12, height  (1.651 m), weight 168 lb (76.204 kg)., Body mass index is 27.96 kg/(m^2). General: WNWD WF. Appears in no acute distress. Neck: Supple. No thyromegaly. No lymphadenopathy. No carotid bruit.  Lungs: Clear bilaterally to auscultation without wheezes, rales, or rhonchi. Breathing is unlabored. Heart: RRR with S1 S2. No murmurs, rubs, or gallops. Abdomen: Soft,  non-tender, non-distended with normoactive bowel sounds. No hepatomegaly. No rebound/guarding. No obvious abdominal masses. Musculoskeletal:  Strength and tone normal for age. Extremities/Skin: Warm and dry. No clubbing or cyanosis. No edema. No rashes or suspicious lesions. Neuro: Alert and oriented X 3. Moves all extremities spontaneously. Gait is normal. CNII-XII grossly in tact. Psych:  Responds to questions appropriately with a normal affect.     ASSESSMENT AND PLAN:  73 y.o. year old female with  1. HYPERTENSION Blood pressure is well controlled. Continue current medication. Check labs monitor. - BASIC METABOLIC PANEL WITH GFR  2. ANXIETY Anxiety is controlled with current dose of Celexa. Continue current medication.  3. POSTMENOPAUSAL OSTEOPOROSIS At her visit 06/16/2013 we discussed that Fosamax should be stopped after being on that therapy for 5 years. She has started Fosamax November 2009. Therefore was do to stop medication November 2014. She did stop the medication at that time. She is still taking calcium but not currently taking  vitamin D sec to finances.   4. Vitamin D deficiency Is tight taking vitamin D in the past and even at her last office visit with me. However today he says that she is no longer taking this because of financial distress  5. Mammogram: Patient states that she has had this annually. She says that she has her mammogram at [facility] every November and already has one scheduled for this November.  6. colonoscopy: Patient reports she had this in approximately 2010 by Dr. Kendell Bane and was told followup 10 years.  7. Immunizations: Tetanus: Patient checked regarding cost and he was insurance the cost would be $40 so she has deferred. Pneumonia vaccine: Pneumovax given 06/16/2012.                    SHE IS DUE FOR PREVNAR 13--I discussed this today--06/21/2014--she is agreeable to get this today. Prevnar 13 given today. Zostavax: She has checked with her  insurance regarding her cost and it is too expensive so she has deferred.  Today --06/21/14 we are checking BMET and giving Prevnar 13.  Routine office visit 6 months or sooner if needed.  Signed, [address] [place], [place], [facility] 06/21/2014 10:19 AM

## 2014-06-21 NOTE — Addendum Note (Signed)
Addended by: Phillips Odor on: 06/21/2014 10:49 AM   Modules accepted: Orders

## 2014-07-06 ENCOUNTER — Encounter: Payer: Self-pay | Admitting: Physician Assistant

## 2014-07-06 ENCOUNTER — Telehealth: Payer: Self-pay | Admitting: Physician Assistant

## 2014-07-06 NOTE — Telephone Encounter (Signed)
Letter sent to patient needing to schedule [employer] [facility]

## 2014-08-16 ENCOUNTER — Ambulatory Visit (INDEPENDENT_AMBULATORY_CARE_PROVIDER_SITE_OTHER): Payer: Medicare Other | Admitting: Family Medicine

## 2014-08-16 DIAGNOSIS — Z23 Encounter for immunization: Secondary | ICD-10-CM

## 2014-08-23 ENCOUNTER — Other Ambulatory Visit: Payer: Self-pay | Admitting: Physician Assistant

## 2014-08-23 DIAGNOSIS — Z1231 Encounter for screening mammogram for malignant neoplasm of breast: Secondary | ICD-10-CM

## 2014-09-02 ENCOUNTER — Other Ambulatory Visit: Payer: Self-pay | Admitting: Family Medicine

## 2014-09-25 ENCOUNTER — Ambulatory Visit (HOSPITAL_COMMUNITY)
Admission: RE | Admit: 2014-09-25 | Discharge: 2014-09-25 | Disposition: A | Discharge status: Home / Self Care | Payer: Medicare Other | Source: Ambulatory Visit | Attending: Physician Assistant | Admitting: Physician Assistant

## 2014-09-25 DIAGNOSIS — Z1231 Encounter for screening mammogram for malignant neoplasm of breast: Secondary | ICD-10-CM | POA: Diagnosis not present

## 2014-09-27 ENCOUNTER — Other Ambulatory Visit: Payer: Self-pay | Admitting: Physician Assistant

## 2014-09-27 DIAGNOSIS — Z1211 Encounter for screening for malignant neoplasm of colon: Secondary | ICD-10-CM

## 2014-10-31 ENCOUNTER — Telehealth: Payer: Self-pay

## 2014-10-31 NOTE — Addendum Note (Signed)
Addended by: Lavena BullionSTEWART,  H on: 10/31/2014 11:44 AM   Modules accepted: Medications

## 2014-10-31 NOTE — Telephone Encounter (Signed)
Patient called to schedule a colonoscopy.  Received a letter.

## 2014-10-31 NOTE — Telephone Encounter (Signed)
Sorry, I was in the wrong phone note! ( Was triaging pt to schedule colonoscopy).

## 2014-10-31 NOTE — Telephone Encounter (Signed)
Gastroenterology Pre-Procedure Review  Request Date: 10/31/2014 Requesting Physician: Mary P Dixon  PATIENT REVIEW QUESTIONS: The patient responded to the following health history questions as indicated:    1. Diabetes Melitis: no 2. Joint replacements in the past 12 months: no 3. Major health problems in the past 3 months: no 4. Has an artificial valve or MVP: no 5. Has a defibrillator: no 6. Has been advised in past to take antibiotics in advance of a procedure like teeth cleaning: no    MEDICATIONS & ALLERGIES:    Patient reports the following regarding taking any blood thinners:   Plavix? no Aspirin? no Coumadin? no  Patient confirms/reports the following medications:  Current Outpatient Prescriptions  Medication Sig Dispense Refill  . calcium carbonate (OS-CAL) 600 MG TABS Take 600 mg by mouth 2 (two) times daily with a meal.    . cholecalciferol (VITAMIN D) 1000 UNITS tablet Take 2,000 Units by mouth daily.    . citalopram (CELEXA) 20 MG tablet TAKE 1 TABLET BY MOUTH EVERY DAY 30 tablet 5  . hydrochlorothiazide (HYDRODIURIL) 25 MG tablet TAKE 1 TABLET BY MOUTH EVERY DAY 30 tablet 5  . fexofenadine (ALLEGRA) 180 MG tablet Take 180 mg by mouth daily.     No current facility-administered medications for this visit.    Patient confirms/reports the following allergies:  No Known Allergies  No orders of the defined types were placed in this encounter.    AUTHORIZATION INFORMATION Primary Insurance:   ID #: Group #:  Pre-Cert / Auth required: Pre-Cert / Auth #:   Secondary Insurance:   ID #:   Group #:  Pre-Cert / Auth required:  Pre-Cert / Auth #:   SCHEDULE INFORMATION: Procedure has been scheduled as follows:  Date:      Time:   Location:   This Gastroenterology Pre-Precedure Review Form is being routed to the following provider(s): R. Roetta SessionsMichael Rourk, MD

## 2014-10-31 NOTE — Telephone Encounter (Signed)
Called pt. She is not having any GI problems. She had a colonoscopy 10/10/2008 by Dr. Jena Gaussourk and she said her colon was twisted and she had to have an air constrast barium enema the next day.  She does have a family hx of colon cancer. Her maternal grandmother deceased in her 6750's with colon cancer.

## 2014-11-01 NOTE — Telephone Encounter (Signed)
Recommend OV to discuss colonoscopy given her prior difficulty, refer to op note.

## 2014-11-06 NOTE — Telephone Encounter (Signed)
OV with Tana CoastLeslie Lewis, PA on 11/28/2014 at 10:00 AM and pt is aware.

## 2014-11-07 ENCOUNTER — Telehealth: Payer: Self-pay | Admitting: Family Medicine

## 2014-11-07 MED ORDER — HYDROCHLOROTHIAZIDE 25 MG PO TABS: 25.0000 mg | ORAL_TABLET | Freq: Every day | ORAL | Status: DC

## 2014-11-07 MED ORDER — CITALOPRAM HYDROBROMIDE 20 MG PO TABS: 20.0000 mg | ORAL_TABLET | Freq: Every day | ORAL | Status: DC

## 2014-11-07 NOTE — Telephone Encounter (Signed)
Medication refilled per protocol. 

## 2014-11-20 ENCOUNTER — Telehealth: Payer: Self-pay | Admitting: Physician Assistant

## 2014-11-20 NOTE — Telephone Encounter (Signed)
[phone]  PT called and left a VM stating she is needing to speak to you about 2 rxs that she had called in at [facility]

## 2014-11-28 ENCOUNTER — Encounter: Payer: Self-pay | Admitting: Gastroenterology

## 2014-11-28 ENCOUNTER — Ambulatory Visit (INDEPENDENT_AMBULATORY_CARE_PROVIDER_SITE_OTHER): Payer: Medicare Other | Admitting: Gastroenterology

## 2014-11-28 VITALS — BP 109/68 | HR 74 | Temp 98.2°F | Ht 66.0 in | Wt 165.8 lb

## 2014-11-28 DIAGNOSIS — Z800 Family history of malignant neoplasm of digestive organs: Secondary | ICD-10-CM | POA: Insufficient documentation

## 2014-11-28 DIAGNOSIS — K5900 Constipation, unspecified: Secondary | ICD-10-CM | POA: Insufficient documentation

## 2014-11-28 DIAGNOSIS — Z8371 Family history of colonic polyps: Secondary | ICD-10-CM | POA: Diagnosis not present

## 2014-11-28 MED ORDER — LINACLOTIDE 145 MCG PO CAPS: 145.0000 ug | ORAL_CAPSULE | Freq: Every day | ORAL | Status: DC

## 2014-11-28 NOTE — Progress Notes (Signed)
cc'ed to pcp °

## 2014-11-28 NOTE — Patient Instructions (Signed)
1. Start Linzess 145 g daily before breakfast for constipation. We have provided you with a sample voucher and a prescription went to pharmacy also. Unfortunately I cannot tell whether this will be on your insurance plan so if you have any problems obtaining the medication please let us know. It is not unusual to have a few episodes of loose stools first day or 2 you take the medication but this should subside. If needed he can hold the medication for a day. 2. I will discuss potential colonoscopy with Dr. Jena Gaussourk, further recommendations to follow. He may be willing to try using pediatric colonoscope and heavier sedation to try to complete a colonoscopy for you.

## 2014-11-28 NOTE — Progress Notes (Signed)
Primary Care Physician:  Frazier Richards, PA-C  Primary Gastroenterologist:  Roetta Sessions, MD   Chief Complaint  Patient presents with  . set up TCS noted twisty colon on previous TCS    HPI:  Wendy Newman is a 74 y.o. female here to discuss high risk rating colonoscopy. She had attempted colonoscopy in December 2009. Due to looping, significant bradycardia and hypotension the procedure was aborted. Procedure complete into the left colon only. When looping was encountered patient became bradycardic and hypotensive with systolic pressure in the 60s and bradycardic in the 40s. When let off on scope she rapidly recovered. Study was complement with barium enema which was normal although views limited by tortuous sigmoid colon .   Complains of chronic constipation. Thinks its her calcium. Sometimes stools are balls/hard. Strains to have a BM. BM once per day. No melena, brbpr. No heartburn, dysphagia, vomiting, abdominal pain, weight loss. FH of colon cancer in maternal grandmother at age 91s. Mother also had colon polyps but she's not sure at what age.     Current Outpatient Prescriptions  Medication Sig Dispense Refill  . calcium carbonate (OS-CAL) 600 MG TABS Take 600 mg by mouth 2 (two) times daily with a meal.    . cholecalciferol (VITAMIN D) 1000 UNITS tablet Take 2,000 Units by mouth daily.    . citalopram (CELEXA) 20 MG tablet Take 1 tablet (20 mg total) by mouth daily. 90 tablet 0  . fexofenadine (ALLEGRA) 180 MG tablet Take 180 mg by mouth daily.    . hydrochlorothiazide (HYDRODIURIL) 25 MG tablet Take 1 tablet (25 mg total) by mouth daily. 90 tablet 0   No current facility-administered medications for this visit.    Allergies as of 11/28/2014  . (No Known Allergies)    Past Medical History  Diagnosis Date  . Hypertension   . Anxiety   . Osteoporosis   . Edema of both legs     at end of day  . Cataract   . Vitamin D deficiency     Past Surgical History  Procedure  Laterality Date  . Cesarean section      X 3  . Abdominal hysterectomy      Complete  . Colonoscopy  10/10/2008    [id number]    Family History  Problem Relation Age of Onset  . Cancer Mother 37    Breast. age 71.   Marland Kitchen Heart disease Father 61    CABG  . Cancer Sister 4    Lung  . Cancer Brother 68    started in the back  . Colon cancer Other     age 80s. maternal grandmother  . Colon polyps Mother     ?age    History   Social History  . Marital Status: Married    Spouse Name: N/A    Number of Children: 3  . Years of Education: N/A   Occupational History  . 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 Smoking status: Never Smoker   . Smokeless tobacco: Not on file  . Alcohol Use: No  . Drug Use: No  . Sexual Activity: Not on file   Other Topics Concern  . Not on file   Social History Narrative      ROS:  General: Negative for anorexia, weight loss, fever, chills, fatigue, weakness. Eyes: Negative for vision changes.  ENT: Negative for hoarseness, difficulty swallowing , nasal congestion. CV: Negative for chest pain, angina,  palpitations, dyspnea on exertion, peripheral edema.  Respiratory: Negative for dyspnea at rest, dyspnea on exertion, cough, sputum, wheezing.  GI: See history of present illness. GU:  Negative for dysuria, hematuria, urinary incontinence, urinary frequency, nocturnal urination.  MS: Negative for joint pain, low back pain.  Derm: Negative for rash or itching.  Neuro: Negative for weakness, abnormal sensation, seizure, frequent headaches, memory loss, confusion.  Psych: Negative for anxiety, depression, suicidal ideation, hallucinations.  Endo: Negative for unusual weight change.  Heme: Negative for bruising or bleeding. Allergy: Negative for rash or hives.    Physical Examination:  BP 109/68 mmHg  Pulse 74  Temp(Src) 98.2 F (36.8 C)  Ht 5\' 6"  (1.676 m)  Wt 165 lb 12.8 oz (75.206 kg)  BMI 26.77  kg/m2   General: Well-nourished, well-developed in no acute distress.  Head: Normocephalic, atraumatic.   Eyes: Conjunctiva pink, no icterus. Mouth: Oropharyngeal mucosa moist and pink , no lesions erythema or exudate. Neck: Supple without thyromegaly, masses, or lymphadenopathy.  Lungs: Clear to auscultation bilaterally.  Heart: Regular rate and rhythm, no murmurs rubs or gallops.  Abdomen: Bowel sounds are normal, nontender, nondistended, no hepatosplenomegaly or masses, no abdominal bruits or    hernia , no rebound or guarding.   Rectal: not performed Extremities: No lower extremity edema. No clubbing or deformities.  Neuro: Alert and oriented x 4 , grossly normal neurologically.  Skin: Warm and dry, no rash or jaundice.   Psych: Alert and cooperative, normal mood and affect.     Imaging Studies: No results found.

## 2014-11-28 NOTE — Assessment & Plan Note (Signed)
Mother had colon polyps, maternal grandmother had colon cancer in her 4450s. Patient is overdue for high risk screening. Discussed at length with regards to potential of attempting another colonoscopy possibly with pediatric scope and deeper sedation has means for colon cancer screening and prevention maneuver. Other options are colon cancer screening via Cologuard, virtual colonoscopy, yearly hemoccults, flex sig/ACBE.   Patient would like to attempt another colonoscopy. I will discuss with Dr. Jena Gaussourk to determine if this is appropriate in this setting given her history. Further recommendations to follow.

## 2014-11-28 NOTE — Assessment & Plan Note (Signed)
Chronic constipation. Start Linzess daily. Voucher and RX provided. Increase dietary fiber and fluid intake.

## 2014-11-29 ENCOUNTER — Other Ambulatory Visit: Payer: Self-pay

## 2014-11-29 MED ORDER — PEG-KCL-NACL-NASULF-NA ASC-C 100 G PO SOLR: 1.0000 | ORAL | Status: DC

## 2014-11-29 NOTE — Progress Notes (Signed)
Please let patient know that Dr. Jena Gaussourk has recommended trying colonoscopy with deep sedation in the OR (difficult conscious sedation in the past). As discussed yesterday, there are no guarantees that he will be able to get all the way around her colon.   Please schedule high risk screening TCS in OR with RMR. -Two full days of clear liquids.  -Make sure she is taking Linzess daily for at least one week prior to bowel prep. If still having problems with constipation she should let us know prior to bowel prep.

## 2014-11-30 NOTE — Progress Notes (Signed)
Pt is schedule and instructions are in the mail

## 2014-12-04 ENCOUNTER — Telehealth: Payer: Self-pay

## 2014-12-04 MED ORDER — PEG 3350-KCL-NA BICARB-NACL 420 G PO SOLR: 4000.0000 mL | ORAL | Status: DC

## 2014-12-04 NOTE — Telephone Encounter (Signed)
Pt called to say the Movie Prep is too expensive. I'm sending in [employer] and mailing new instructions.

## 2014-12-15 NOTE — Patient Instructions (Signed)
Wendy Newman  12/15/2014   Your procedure is scheduled on:  12/21/2014  Report to [facility] at 9:40 AM.  Call this number if you have problems the morning of surgery: [phone]   Remember:   Do not eat food or drink liquids after midnight.   Take these medicines the morning of surgery with A SIP OF WATER: Celexa, Linzess, Allegra   Do not wear jewelry, make-up or nail polish.  Do not wear lotions, powders, or perfumes. You may wear deodorant.  Do not shave 48 hours prior to surgery. Men may shave face and neck.  Do not bring valuables to the hospital.  [facility] is not responsible for any belongings or valuables.               Contacts, dentures or bridgework may not be worn into surgery.  Leave suitcase in the car. After surgery it may be brought to your room.  For patients admitted to the hospital, discharge time is determined by your treatment team.               Patients discharged the day of surgery will not be allowed to drive home.  Name and phone number of your driver:   Special Instructions: N/A   Please read over the following fact sheets that you were given: Anesthesia Post-op Instructions   PATIENT INSTRUCTIONS POST-ANESTHESIA  IMMEDIATELY FOLLOWING SURGERY:  Do not drive or operate machinery for the first twenty four hours after surgery.  Do not make any important decisions for twenty four hours after surgery or while taking narcotic pain medications or sedatives.  If you develop intractable nausea and vomiting or a severe headache please notify your doctor immediately.  FOLLOW-UP:  Please make an appointment with your surgeon as instructed. You do not need to follow up with anesthesia unless specifically instructed to do so.  WOUND CARE INSTRUCTIONS (if applicable):  Keep a dry clean dressing on the anesthesia/puncture wound site if there is drainage.  Once the wound has quit draining you may leave it open to air.  Generally you should leave the bandage intact  for twenty four hours unless there is drainage.  If the epidural site drains for more than 36-48 hours please call the anesthesia department.  QUESTIONS?:  Please feel free to call your physician or the hospital operator if you have any questions, and they will be happy to assist you.      Colonoscopy A colonoscopy is an exam to look at the entire large intestine (colon). This exam can help find problems such as tumors, polyps, inflammation, and areas of bleeding. The exam takes about 1 hour.  LET [facility] PROVIDER KNOW ABOUT:   Any allergies you have.  All medicines you are taking, including vitamins, herbs, eye drops, creams, and over-the-counter medicines.  Previous problems you or members of your family have had with the use of anesthetics.  Any blood disorders you have.  Previous surgeries you have had.  Medical conditions you have. RISKS AND COMPLICATIONS  Generally, this is a safe procedure. However, as with any procedure, complications can occur. Possible complications include:  Bleeding.  Tearing or rupture of the colon wall.  Reaction to medicines given during the exam.  Infection (rare). BEFORE THE PROCEDURE   Ask your health care provider about changing or stopping your regular medicines.  You may be prescribed an oral bowel prep. This involves drinking a large amount of medicated liquid, starting the day before your  procedure. The liquid will cause you to have multiple loose stools until your stool is almost clear or light green. This cleans out your colon in preparation for the procedure.  Do not eat or drink anything else once you have started the bowel prep, unless your health care provider tells you it is safe to do so.  Arrange for someone to drive you home after the procedure. PROCEDURE   You will be given medicine to help you relax (sedative).  You will lie on your side with your knees bent.  A long, flexible tube with a light and camera on the  end (colonoscope) will be inserted through the rectum and into the colon. The camera sends video back to a computer screen as it moves through the colon. The colonoscope also releases carbon dioxide gas to inflate the colon. This helps your health care provider see the area better.  During the exam, your health care provider may take a small tissue sample (biopsy) to be examined under a microscope if any abnormalities are found.  The exam is finished when the entire colon has been viewed. AFTER THE PROCEDURE   Do not drive for 24 hours after the exam.  You may have a small amount of blood in your stool.  You may pass moderate amounts of gas and have mild abdominal cramping or bloating. This is caused by the gas used to inflate your colon during the exam.  Ask when your test results will be ready and how you will get your results. Make sure you get your test results. Document Released: 10/10/2000 Document Revised: 08/03/2013 Document Reviewed: 06/20/2013 [facility] Patient Information 2015 [place], [place]. This information is not intended to replace advice given to you by your health care provider. Make sure you discuss any questions you have with your health care provider.

## 2014-12-18 ENCOUNTER — Encounter (HOSPITAL_COMMUNITY): Payer: Self-pay

## 2014-12-18 ENCOUNTER — Encounter (HOSPITAL_COMMUNITY)
Admission: RE | Admit: 2014-12-18 | Discharge: 2014-12-18 | Disposition: A | Discharge status: Home / Self Care | Payer: Medicare Other | Source: Ambulatory Visit | Attending: Internal Medicine | Admitting: Internal Medicine

## 2014-12-18 ENCOUNTER — Other Ambulatory Visit: Payer: Medicare Other

## 2014-12-18 DIAGNOSIS — Q438 Other specified congenital malformations of intestine: Secondary | ICD-10-CM | POA: Diagnosis not present

## 2014-12-18 DIAGNOSIS — Z800 Family history of malignant neoplasm of digestive organs: Secondary | ICD-10-CM | POA: Diagnosis not present

## 2014-12-18 DIAGNOSIS — Z79899 Other long term (current) drug therapy: Secondary | ICD-10-CM | POA: Diagnosis not present

## 2014-12-18 DIAGNOSIS — K5900 Constipation, unspecified: Secondary | ICD-10-CM | POA: Diagnosis not present

## 2014-12-18 DIAGNOSIS — Z8371 Family history of colonic polyps: Secondary | ICD-10-CM | POA: Diagnosis not present

## 2014-12-18 LAB — CBC
HCT: 37.8 % (ref 36.0–46.0)
Hemoglobin: 12.6 g/dL (ref 12.0–15.0)
MCH: 30.7 pg (ref 26.0–34.0)
MCHC: 33.3 g/dL (ref 30.0–36.0)
MCV: 92 fL (ref 78.0–100.0)
Platelets: 234 10*3/uL (ref 150–400)
RBC: 4.11 MIL/uL (ref 3.87–5.11)
RDW: 13.6 % (ref 11.5–15.5)
WBC: 7.8 10*3/uL (ref 4.0–10.5)

## 2014-12-18 LAB — BASIC METABOLIC PANEL
Anion gap: 7 (ref 5–15)
BUN: 22 mg/dL (ref 6–23)
CALCIUM: 9.1 mg/dL (ref 8.4–10.5)
CHLORIDE: 103 mmol/L (ref 96–112)
CO2: 29 mmol/L (ref 19–32)
CREATININE: 0.96 mg/dL (ref 0.50–1.10)
GFR calc non Af Amer: 58 mL/min — ABNORMAL LOW (ref >90)
GFR, EST AFRICAN AMERICAN: 67 mL/min — AB (ref >90)
Glucose, Bld: 92 mg/dL (ref 70–99)
POTASSIUM: 3.6 mmol/L (ref 3.5–5.1)
Sodium: 139 mmol/L (ref 135–145)

## 2014-12-18 NOTE — Pre-Procedure Instructions (Signed)
Patient given information to sign up for my chart at home. 

## 2014-12-21 ENCOUNTER — Ambulatory Visit (HOSPITAL_COMMUNITY)
Admission: RE | Admit: 2014-12-21 | Discharge: 2014-12-21 | Disposition: A | Discharge status: Home / Self Care | Payer: Medicare Other | Source: Ambulatory Visit | Attending: Internal Medicine | Admitting: Internal Medicine

## 2014-12-21 ENCOUNTER — Encounter (HOSPITAL_COMMUNITY): Payer: Self-pay | Admitting: *Deleted

## 2014-12-21 ENCOUNTER — Ambulatory Visit (HOSPITAL_COMMUNITY): Payer: Medicare Other | Admitting: Anesthesiology

## 2014-12-21 ENCOUNTER — Ambulatory Visit: Payer: Medicare Other | Admitting: Physician Assistant

## 2014-12-21 ENCOUNTER — Encounter (HOSPITAL_COMMUNITY)
Admission: RE | Disposition: A | Discharge status: Home / Self Care | Payer: Self-pay | Source: Ambulatory Visit | Attending: Internal Medicine

## 2014-12-21 DIAGNOSIS — Z800 Family history of malignant neoplasm of digestive organs: Secondary | ICD-10-CM

## 2014-12-21 DIAGNOSIS — Z8371 Family history of colonic polyps: Secondary | ICD-10-CM | POA: Diagnosis not present

## 2014-12-21 DIAGNOSIS — Z79899 Other long term (current) drug therapy: Secondary | ICD-10-CM | POA: Diagnosis not present

## 2014-12-21 DIAGNOSIS — K5900 Constipation, unspecified: Secondary | ICD-10-CM | POA: Diagnosis not present

## 2014-12-21 DIAGNOSIS — Z1211 Encounter for screening for malignant neoplasm of colon: Secondary | ICD-10-CM | POA: Diagnosis not present

## 2014-12-21 DIAGNOSIS — Q438 Other specified congenital malformations of intestine: Secondary | ICD-10-CM | POA: Diagnosis not present

## 2014-12-21 HISTORY — PX: COLONOSCOPY WITH PROPOFOL: SHX5780

## 2014-12-21 SURGERY — COLONOSCOPY WITH PROPOFOL
Anesthesia: Monitor Anesthesia Care

## 2014-12-21 MED ORDER — ONDANSETRON HCL 4 MG/2ML IJ SOLN: 4.0000 mg | Freq: Once | INTRAMUSCULAR | Status: DC | PRN

## 2014-12-21 MED ORDER — STERILE WATER FOR IRRIGATION IR SOLN
Status: DC | PRN
  Administered 2014-12-21: 1000 mL

## 2014-12-21 MED ORDER — FENTANYL CITRATE 0.05 MG/ML IJ SOLN: 25.0000 ug | INTRAMUSCULAR | Status: DC | PRN

## 2014-12-21 MED ORDER — ONDANSETRON HCL 4 MG/2ML IJ SOLN
4.0000 mg | Freq: Once | INTRAMUSCULAR | Status: AC
Start: 2014-12-21 — End: 2014-12-21
  Administered 2014-12-21: 4 mg via INTRAVENOUS

## 2014-12-21 MED ORDER — PROPOFOL INFUSION 10 MG/ML OPTIME
INTRAVENOUS | Status: DC | PRN
  Administered 2014-12-21: 12:00:00 via INTRAVENOUS
  Administered 2014-12-21: 100 ug/kg/min via INTRAVENOUS

## 2014-12-21 MED ORDER — GLYCOPYRROLATE 0.2 MG/ML IJ SOLN
INTRAMUSCULAR | Status: DC | PRN
Start: 2014-12-21 — End: 2014-12-21
  Administered 2014-12-21: 0.2 mg via INTRAVENOUS

## 2014-12-21 MED ORDER — MIDAZOLAM HCL 2 MG/2ML IJ SOLN
1.0000 mg | INTRAMUSCULAR | Status: DC | PRN
  Administered 2014-12-21: 2 mg via INTRAVENOUS

## 2014-12-21 MED ORDER — PROPOFOL 10 MG/ML IV BOLUS
INTRAVENOUS | Status: DC | PRN
  Administered 2014-12-21: 10 mg via INTRAVENOUS

## 2014-12-21 MED ORDER — PROPOFOL INFUSION 10 MG/ML OPTIME: INTRAVENOUS | Status: DC | PRN

## 2014-12-21 MED ORDER — GLYCOPYRROLATE 0.2 MG/ML IJ SOLN
INTRAMUSCULAR | Status: AC
  Filled 2014-12-21: qty 1

## 2014-12-21 MED ORDER — LACTATED RINGERS IV SOLN
INTRAVENOUS | Status: DC
  Administered 2014-12-21: 11:00:00 via INTRAVENOUS

## 2014-12-21 MED ORDER — ONDANSETRON HCL 4 MG/2ML IJ SOLN
INTRAMUSCULAR | Status: AC
  Filled 2014-12-21: qty 2

## 2014-12-21 MED ORDER — FENTANYL CITRATE 0.05 MG/ML IJ SOLN
INTRAMUSCULAR | Status: AC
  Filled 2014-12-21: qty 2

## 2014-12-21 MED ORDER — MIDAZOLAM HCL 2 MG/2ML IJ SOLN
INTRAMUSCULAR | Status: AC
  Filled 2014-12-21: qty 2

## 2014-12-21 MED ORDER — FENTANYL CITRATE 0.05 MG/ML IJ SOLN
INTRAMUSCULAR | Status: DC | PRN
  Administered 2014-12-21 (×2): 25 ug via INTRAVENOUS

## 2014-12-21 MED ORDER — FENTANYL CITRATE 0.05 MG/ML IJ SOLN
25.0000 ug | INTRAMUSCULAR | Status: AC
  Administered 2014-12-21 (×2): 25 ug via INTRAVENOUS

## 2014-12-21 MED ORDER — FENTANYL CITRATE 0.05 MG/ML IJ SOLN
INTRAMUSCULAR | Status: AC
Start: 2014-12-21 — End: 2014-12-21
  Filled 2014-12-21: qty 2

## 2014-12-21 MED ORDER — GLYCOPYRROLATE 0.2 MG/ML IJ SOLN
0.2000 mg | Freq: Once | INTRAMUSCULAR | Status: AC
  Administered 2014-12-21: 0.2 mg via INTRAVENOUS

## 2014-12-21 SURGICAL SUPPLY — 24 items
ELECT REM PT RETURN 9FT ADLT (ELECTROSURGICAL)
ELECTRODE REM PT RTRN 9FT ADLT (ELECTROSURGICAL) IMPLANT
FCP BXJMBJMB 240X2.8X (CUTTING FORCEPS)
FLOOR PAD 36X40 (MISCELLANEOUS) ×2
FORCEPS BIOP RAD 4 LRG CAP 4 (CUTTING FORCEPS) IMPLANT
FORCEPS BIOP RJ4 240 W/NDL (CUTTING FORCEPS)
FORCEPS BXJMBJMB 240X2.8X (CUTTING FORCEPS) IMPLANT
FORMALIN 10 PREFIL 20ML (MISCELLANEOUS) IMPLANT
INJECTOR/SNARE I SNARE (MISCELLANEOUS) IMPLANT
KIT CLEAN ENDO COMPLIANCE (KITS) ×2 IMPLANT
LUBRICANT JELLY 4.5OZ STERILE (MISCELLANEOUS) ×1 IMPLANT
MANIFOLD NEPTUNE II (INSTRUMENTS) ×1 IMPLANT
NDL SCLEROTHERAPY 25GX240 (NEEDLE) IMPLANT
NEEDLE SCLEROTHERAPY 25GX240 (NEEDLE) IMPLANT
PAD FLOOR 36X40 (MISCELLANEOUS) IMPLANT
PROBE APC STR FIRE (PROBE) IMPLANT
PROBE INJECTION GOLD (MISCELLANEOUS)
PROBE INJECTION GOLD 7FR (MISCELLANEOUS) IMPLANT
SNARE ROTATE MED OVAL 20MM (MISCELLANEOUS) IMPLANT
SNARE SHORT THROW 13M SML OVAL (MISCELLANEOUS) ×1 IMPLANT
SYR 50ML LL SCALE MARK (SYRINGE) ×1 IMPLANT
TRAP SPECIMEN MUCOUS 40CC (MISCELLANEOUS) IMPLANT
TUBING IRRIGATION ENDOGATOR (MISCELLANEOUS) ×1 IMPLANT
WATER STERILE IRR 1000ML POUR (IV SOLUTION) ×1 IMPLANT

## 2014-12-21 NOTE — Interval H&P Note (Signed)
History and Physical Interval Note:  12/21/2014 11:21 AM  Wendy BeadyBarbara S Alabi  has presented today for surgery, with the diagnosis of chronic comstipation  The various methods of treatment have been discussed with the patient and family. After consideration of risks, benefits and other options for treatment, the patient has consented to  Procedure(s) with comments: COLONOSCOPY WITH PROPOFOL (N/A) - 1130  as a surgical intervention .  The patient's history has been reviewed, patient examined, no change in status, stable for surgery.  I have reviewed the patient's chart and labs.  Questions were answered to the patient's satisfaction.         Linzess helping constipation significantly.  Hypotension and bradycardia at attempted prior colonoscopy. Discussed with anesthesia. Colonoscopy today per plan. I told patient I'll make my best effort to safely complete the examination.  The risks, benefits, limitations, alternatives and imponderables have been reviewed with the patient. Questions have been answered. All parties are agreeable.

## 2014-12-21 NOTE — Discharge Instructions (Signed)
°  Colonoscopy Discharge Instructions  Read the instructions outlined below and refer to this sheet in the next few weeks. These discharge instructions provide you with general information on caring for yourself after you leave the hospital. Your doctor may also give you specific instructions. While your treatment has been planned according to the most current medical practices available, unavoidable complications occasionally occur. If you have any problems or questions after discharge, call Dr. Jena Gaussourk at [phone]. ACTIVITY  You may resume your regular activity, but move at a slower pace for the next 24 hours.   Take frequent rest periods for the next 24 hours.   Walking will help get rid of the air and reduce the bloated feeling in your belly (abdomen).   No driving for 24 hours (because of the medicine (anesthesia) used during the test).    Do not sign any important legal documents or operate any machinery for 24 hours (because of the anesthesia used during the test).  NUTRITION  Drink plenty of fluids.   You may resume your normal diet as instructed by your doctor.   Begin with a light meal and progress to your normal diet. Heavy or fried foods are harder to digest and may make you feel sick to your stomach (nauseated).   Avoid alcoholic beverages for 24 hours or as instructed.  MEDICATIONS  You may resume your normal medications unless your doctor tells you otherwise.  WHAT YOU CAN EXPECT TODAY  Some feelings of bloating in the abdomen.   Passage of more gas than usual.   Spotting of blood in your stool or on the toilet paper.  IF YOU HAD POLYPS REMOVED DURING THE COLONOSCOPY:  No aspirin products for 7 days or as instructed.   No alcohol for 7 days or as instructed.   Eat a soft diet for the next 24 hours.  FINDING OUT THE RESULTS OF YOUR TEST Not all test results are available during your visit. If your test results are not back during the visit, make an appointment  with your caregiver to find out the results. Do not assume everything is normal if you have not heard from your caregiver or the medical facility. It is important for you to follow up on all of your test results.  SEEK IMMEDIATE MEDICAL ATTENTION IF:  You have more than a spotting of blood in your stool.   Your belly is swollen (abdominal distention).   You are nauseated or vomiting.   You have a temperature over 101.   You have abdominal pain or discomfort that is severe or gets worse throughout the day.    Constipation information provided  Continue Linzess 145 daily  Office visit with us in 6 months

## 2014-12-21 NOTE — Anesthesia Preprocedure Evaluation (Signed)
Anesthesia Evaluation  Patient identified by MRN, date of birth, ID band Patient awake    Reviewed: Allergy & Precautions, NPO status , Patient's Chart, lab work & pertinent test results  Airway Mallampati: II  TM Distance: >3 FB     Dental  (+) Edentulous Upper, Missing   Pulmonary neg pulmonary ROS,  breath sounds clear to auscultation        Cardiovascular hypertension, Pt. on medications Rhythm:Regular Rate:Normal     Neuro/Psych  Headaches, PSYCHIATRIC DISORDERS Anxiety    GI/Hepatic negative GI ROS,   Endo/Other    Renal/GU      Musculoskeletal   Abdominal   Peds  Hematology   Anesthesia Other Findings   Reproductive/Obstetrics                             Anesthesia Physical Anesthesia Plan  ASA: II  Anesthesia Plan: MAC   Post-op Pain Management:    Induction: Intravenous  Airway Management Planned: Simple Face Mask  Additional Equipment:   Intra-op Plan:   Post-operative Plan:   Informed Consent: I have reviewed the patients History and Physical, chart, labs and discussed the procedure including the risks, benefits and alternatives for the proposed anesthesia with the patient or authorized representative who has indicated his/her understanding and acceptance.     Plan Discussed with:   Anesthesia Plan Comments:         Anesthesia Quick Evaluation

## 2014-12-21 NOTE — Op Note (Signed)
NAMRulon Sera:  Newman, Wendy               ACCOUNT NO.:  [id number]  MEDICAL RECORD NO.:  [id number]  LOCATION:  [facility]                          FACILITY:  [facility]  PHYSICIAN:  R. Roetta SessionsMichael , MD FACP FACGDATE OF BIRTH:  1941-02-10  DATE OF PROCEDURE:  12/21/2014 DATE OF DISCHARGE:                              OPERATIVE REPORT   PROCEDURE:  Colonoscopy.  INDICATIONS FOR PROCEDURE:  A 74 year old lady with a positive family history of colon polyps and colon cancer.  Chronically constipated, but much better on Linzess 145 mcg daily recently.  Colonoscopy is now being done.  Risks, benefits, limitations, alternatives, and imponderables have been discussed.  Because of difficulties with sedation bradycardia previously, deep sedation will be induced by Dr. Jayme CloudGonzalez and associates.  PROCEDURE NOTE:  O2 saturation, blood pressure, pulse, respirations were monitored throughout the entire procedure.  Deep sedation per Dr. Jayme CloudGonzalez and associates.  INSTRUMENT:  Pentax video chip system.  FINDINGS:  Digital rectal exam revealed no abnormalities.  Examination of the rectal mucosa including retroflexed view of the anal verge demonstrated no abnormalities.  Colonic mucosa was surveyed from the rectosigmoid junction through the left transverse right colon to the appendiceal orifice, ileocecal valve, and cecum.  These structures were well seen and photographed in the record.  The patient's left colon was noncompliant.  More proximally, the colon was redundant.  Gentle external abdominal pressure and changing of the patient's position required to reach the cecum.  From the level of the cecum and ileocecal valve, the scope was slowly and cautiously withdrawn.  All previously mentioned mucosal surfaces were again seen. were again seen.  The colonic mucosa otherwise appeared normal.  Cecal withdrawal time 8 minutes.  The patient tolerated the procedure well and was taken to PACU in stable  condition.  IMPRESSION:  Noncompliant left colon and redundant right colon.  Rectal and colonic mucosa appeared normal.  RECOMMENDATIONS: 1. Continue Linzess 145 mcg daily. 2. Office followup with us in 6 months.     Jonathon Bellows. Michael , MD Caleen EssexFACP FACG     RMR/MEDQ  D:  12/21/2014  T:  12/21/2014  Job:  [id number]  cc:   [phone] Beth BertrandDixon, [place] Fax: [phone]

## 2014-12-21 NOTE — H&P (View-Only) (Signed)
Please let patient know that Dr. Jena Gaussourk has recommended trying colonoscopy with deep sedation in the OR (difficult conscious sedation in the past). As discussed yesterday, there are no guarantees that he will be able to get all the way around her colon.   Please schedule high risk screening TCS in OR with RMR. -Two full days of clear liquids.  -Make sure she is taking Linzess daily for at least one week prior to bowel prep. If still having problems with constipation she should let us know prior to bowel prep.

## 2014-12-21 NOTE — Transfer of Care (Signed)
Immediate Anesthesia Transfer of Care Note  Patient: Wendy Newman  Procedure(s) Performed: Procedure(s): COLONOSCOPY WITH PROPOFOL; IN CECUM AT 1220 (N/A)  Patient Location: PACU  Anesthesia Type:MAC  Level of Consciousness: awake, alert  and oriented  Airway & Oxygen Therapy: Patient Spontanous Breathing  Post-op Assessment: Report given to RN  Post vital signs: Reviewed and stable  Last Vitals:  Filed Vitals:   12/21/14 1125  BP: 118/67  Pulse:   Temp:   Resp: 15    Complications: No apparent anesthesia complications

## 2014-12-21 NOTE — Anesthesia Postprocedure Evaluation (Signed)
  Anesthesia Post-op Note  Patient: Wendy Newman  Procedure(s) Performed: Procedure(s): COLONOSCOPY WITH PROPOFOL; IN CECUM AT 1220 (N/A)  Patient Location: PACU  Anesthesia Type:MAC  Level of Consciousness: awake, alert  and oriented  Airway and Oxygen Therapy: Patient Spontanous Breathing  Post-op Pain: none  Post-op Assessment: Post-op Vital signs reviewed, Patient's Cardiovascular Status Stable, Respiratory Function Stable, Patent Airway and No signs of Nausea or vomiting  Post-op Vital Signs: Reviewed and stable  Last Vitals:  Filed Vitals:   12/21/14 1125  BP: 118/67  Pulse:   Temp:   Resp: 15    Complications: No apparent anesthesia complications

## 2014-12-22 ENCOUNTER — Encounter (HOSPITAL_COMMUNITY): Payer: Self-pay | Admitting: Internal Medicine

## 2014-12-25 ENCOUNTER — Encounter: Payer: Self-pay | Admitting: Physician Assistant

## 2014-12-25 ENCOUNTER — Ambulatory Visit (INDEPENDENT_AMBULATORY_CARE_PROVIDER_SITE_OTHER): Payer: Medicare Other | Admitting: Physician Assistant

## 2014-12-25 ENCOUNTER — Encounter: Payer: Self-pay | Admitting: Family Medicine

## 2014-12-25 VITALS — BP 132/82 | HR 56 | Temp 98.1°F | Resp 18 | Wt 165.0 lb

## 2014-12-25 DIAGNOSIS — Z800 Family history of malignant neoplasm of digestive organs: Secondary | ICD-10-CM

## 2014-12-25 DIAGNOSIS — I10 Essential (primary) hypertension: Secondary | ICD-10-CM | POA: Diagnosis not present

## 2014-12-25 DIAGNOSIS — K5900 Constipation, unspecified: Secondary | ICD-10-CM | POA: Diagnosis not present

## 2014-12-25 DIAGNOSIS — F419 Anxiety disorder, unspecified: Secondary | ICD-10-CM | POA: Diagnosis not present

## 2014-12-25 DIAGNOSIS — M810 Age-related osteoporosis without current pathological fracture: Secondary | ICD-10-CM | POA: Diagnosis not present

## 2014-12-25 DIAGNOSIS — E559 Vitamin D deficiency, unspecified: Secondary | ICD-10-CM

## 2014-12-25 DIAGNOSIS — E785 Hyperlipidemia, unspecified: Secondary | ICD-10-CM | POA: Diagnosis not present

## 2014-12-25 DIAGNOSIS — Z83719 Family history of colon polyps, unspecified: Secondary | ICD-10-CM

## 2014-12-25 DIAGNOSIS — Z8371 Family history of colonic polyps: Secondary | ICD-10-CM | POA: Diagnosis not present

## 2014-12-25 NOTE — Progress Notes (Signed)
Patient ID: Wendy Newman MRN: [id number], DOB: 02-22-1941, 74 y.o. Date of Encounter: _0 @  Chief Complaint:  Chief Complaint  Patient presents with  . 6 mth check up    not fasting    HPI: 74 y.o. year old female  presents routine followup office visit.  At her  visit in 05/2013 we discussed stopping Fosamax that she had been on it for 5 years. At f/u OV she stated that she did stop that medicine. She was taking her calcium and vitamin D. At OV-05/2014-she says she is no longer taking the Vitamin D secondary to finances. Back on Vitamin D and Calcium now--Entered 12/25/2014 OV  Financial distress: I also see her husband as a patient. I had noticed that he had lost a lot of weight. I further evaluated his weight loss. Found out that they were having severe financial distress. He was eating what he could. At followup he has told me that they are now using 3 different food pantries as well as food from the church. She does not make any further comments or provide any further information regarding the financial distress today except for the fact that she cannot afford to buy over-the-counter vitamin D.  She reports that the Celexa is still working well for her and is controlling her anxiety. Is having no adverse effects. Says that she has "always been a very nervous person" but the Celexa controls this well. Does mentioned today that her 46 year old grandson is living with her and her husband now. says he was wanting to drop out of school and is living with them currently. I discussed that the Celexa does come in a higher dose but she says that this is dose works well for her and she does not need to increase the dose. Says that she mostly needs this "for her nerves" but says that this is controlled at this dose.  She has no complaints. Even with exertion she still has no chest pressure tightness or heaviness in no increased shortness of breath or dyspnea.  She reports that she just  had a colonoscopy last Thursday. Says that there were no polyps.  I see  Linzess on her medicine list and asked her how that's doing.  She says " that just hits her and goes right through. Says all of a sudden she has loose stool--says it comes before she can even get to the bathroom".   Past Medical History  Diagnosis Date  . Hypertension   . Anxiety   . Osteoporosis   . Edema of both legs     at end of day  . Cataract   . Vitamin D deficiency      Home Meds:  Outpatient Prescriptions Prior to Visit  Medication Sig Dispense Refill  . calcium carbonate (OS-CAL) 600 MG TABS Take 600 mg by mouth 2 (two) times daily with a meal.    . cholecalciferol (VITAMIN D) 1000 UNITS tablet Take 1,000 Units by mouth daily.     . citalopram (CELEXA) 20 MG tablet Take 1 tablet (20 mg total) by mouth daily. 90 tablet 0  . fexofenadine (ALLEGRA) 180 MG tablet Take 180 mg by mouth daily as needed for allergies.     . hydrochlorothiazide (HYDRODIURIL) 25 MG tablet Take 1 tablet (25 mg total) by mouth daily. 90 tablet 0  . Linaclotide (LINZESS) 145 MCG CAPS capsule Take 1 capsule (145 mcg total) by mouth daily. (Patient not taking: Reported on 12/25/2014) 30 capsule 11  .  peg 3350 powder (MOVIPREP) 100 G SOLR Take 1 kit (200 g total) by mouth as directed. 1 kit 0  . polyethylene glycol-electrolytes (TRILYTE) 420 G solution Take 4,000 mLs by mouth as directed. 4000 mL 0   No facility-administered medications prior to visit.     Allergies: No Known Allergies  History   Social History  . Marital Status: Married    Spouse Name: N/A  . Number of Children: 3  . Years of Education: N/A   Occupational History  . retired from [employer]  . Smoking status: Never Smoker   . Smokeless tobacco: Not on file  . Alcohol Use: No  . Drug Use: No  . Sexual Activity: Yes    Birth Control/ Protection: Surgical   Other Topics Concern  . Not on file   Social History  Narrative    Family History  Problem Relation Age of Onset  . Cancer Mother 79    Breast. age 38.   Marland Kitchen Heart disease Father 22    CABG  . Cancer Sister 100    Lung  . Cancer Brother 68    started in the back  . Colon cancer Other     age 68s. maternal grandmother  . Colon polyps Mother     ?age     Review of Systems:  See HPI for pertinent ROS. All other ROS negative.    Physical Exam: Blood pressure 132/82, pulse 56, temperature 98.1 F (36.7 C), temperature source Oral, resp. rate 18, weight 165 lb (74.844 kg)., Body mass index is 26.64 kg/(m^2). General: WNWD WF. Appears in no acute distress. Neck: Supple. No thyromegaly. No lymphadenopathy. No carotid bruit.  Lungs: Clear bilaterally to auscultation without wheezes, rales, or rhonchi. Breathing is unlabored. Heart: RRR with S1 S2. No murmurs, rubs, or gallops. Abdomen: Soft, non-tender, non-distended with normoactive bowel sounds. No hepatomegaly. No rebound/guarding. No obvious abdominal masses. Musculoskeletal:  Strength and tone normal for age. Extremities/Skin: Warm and dry.  No edema.  Neuro: Alert and oriented X 3. Moves all extremities spontaneously. Gait is normal. CNII-XII grossly in tact. Psych:  Responds to questions appropriately with a normal affect.     ASSESSMENT AND PLAN:  74 y.o. year old female with  1. HYPERTENSION Blood pressure is well controlled. Continue current medication. She just had lab work by GI 12/18/14 had BE MET--- stable so do not need to repeat now  2. ANXIETY Anxiety is controlled with current dose of Celexa. Continue current medication.  3. POSTMENOPAUSAL OSTEOPOROSIS At her visit 06/16/2013 we discussed that Fosamax should be stopped after being on that therapy for 5 years. She has started Fosamax November 2009. Therefore was do to stop medication November 2014. She did stop the medication at that time. At [place] 05/2014--She was still taking calcium but not currently taking  vitamin D  sec to finances.  At office visit 11/2014 she states that she is back on the vitamin D and also still taking calcium  4. Vitamin D deficiency Is tight taking vitamin D in the past and even at her last office visit with me. However today he says that she is no longer taking this because of financial distress At office visit 11/2014 she reports that she is back on the vitamin D  5. Constipation: See HPI--- today I gave her samples of Amitiza 8 g--- 8 tablets of this.... Also gave her samples of Amitiza 24 g--8 tablets of this.  I told her to try this medication. If one of these doses of Amitiza does work for he--r then she can call either me or her GI doctor for prescription. Told her if these also cause problems with stools being too loose or if the medication does not work well for her--- to still call either me or GI for further instructions of treatment.  6. Lipids--she had fasting lipid profile 05/2010 and Lipid Panel was Favorable at that time. In the near future, will have her come for office visit fasting so that we can repeat a lipid panel.  7. Mammogram: Patient states that she has had this annually. She says that she has her mammogram at [facility] every November.  8. colonoscopy: Patient reports she had this  by Dr. Sydell Axon -- had follow-up colonoscopy last week--- February 2016  9. Immunizations: Tetanus: Patient checked regarding cost and he was insurance the cost would be $40 so she has deferred.--11/2014--I gave this to Maudie Mercury to put into Epic--so will not cont to show up on Report Pneumonia vaccine: Pneumovax given 06/16/2012.                                  Prevnar 13 given here 06/21/2014 Zostavax: She has checked with her insurance regarding her cost and it is too expensive so she has deferred.--11/2014--I gave this to Maudie Mercury to put into Epic--so will not cont to show up on Report    Routine office visit 6 months or sooner if needed.  Signed, [address] [place], [place],  [facility] 12/25/2014 9:55 AM

## 2015-01-04 ENCOUNTER — Telehealth: Payer: Self-pay | Admitting: Family Medicine

## 2015-01-04 DIAGNOSIS — R9431 Abnormal electrocardiogram [ECG] [EKG]: Secondary | ICD-10-CM

## 2015-01-04 NOTE — Telephone Encounter (Signed)
Called and spoke to patient about EKG rec'd from GI.  Provider would like her to se Cardiologist for eval.  Pt OK with referral.  referral initiated.  Pt would like to be seen in [place].

## 2015-01-04 NOTE — Telephone Encounter (Signed)
-----   Message from Dorena BodoMary B Dixon, PA-C sent at 01/03/2015  1:26 PM EST ----- I reviewed EKG which was performed by GI at time of colonoscopy. I do not see any acute abnormalities that requires urgent follow-up. However, given that it is being read as abnormal and she does have cardiac risk factors to include age hypertension hyperlipidemia would refer to cardiology for evaluation. Please inform patient of the above. Tell her that this is nothing for her to be worrying about but that we just think we should have cardiology follow-up. (FYI--her husband sees cardiology he recently had aortic valve replacement)

## 2015-01-10 ENCOUNTER — Telehealth: Payer: Self-pay | Admitting: *Deleted

## 2015-01-10 MED ORDER — PERMETHRIN 5 % EX CREA: 1.0000 "application " | TOPICAL_CREAM | Freq: Once | CUTANEOUS | Status: DC

## 2015-01-10 NOTE — Telephone Encounter (Signed)
Yes.  Also, make sure she knows about cleaning Towels, bedsheets all clothing they have worn etc and dry on high heat. Send Rx for: Permethrin Cream--apply cream from head (avoid mouth, nose, eyes) to soles of feet and wash after 8-14 hours-- dispense #60 g +0 refills.

## 2015-01-10 NOTE — Telephone Encounter (Signed)
Prescription called in to pharmacy

## 2015-01-10 NOTE — Telephone Encounter (Signed)
Pt grandson has scabies,went to doctor this morning adn they gave him a cream to put on. Pt wants to know if you can prescribe her something to help her get rid of it for herself as well.  [facility] [address]

## 2015-01-11 ENCOUNTER — Telehealth: Payer: Self-pay | Admitting: Physician Assistant

## 2015-01-11 NOTE — Telephone Encounter (Signed)
Crotamiton/ Eurax is not on formulary. This medication is not covered at all.   Lindane/Hexit is a Tier 4 medication and as such will be more expensive.   Call placed to patient and patient made aware.   States that she can not afford any of the creams at this time and requested for recommendation. Is OTC cream effective?

## 2015-01-11 NOTE — Telephone Encounter (Signed)
Patient calling to say the the ointment that was called in yesterday for scabies? Is too expensive would like to know if we can call in something else  [phone]   [facility] [place]

## 2015-01-11 NOTE — Telephone Encounter (Signed)
Since cost is an issue, I guess she has no option but try something over-the-counter.

## 2015-01-11 NOTE — Telephone Encounter (Signed)
Which of these treatments is a lower tier with her insurance? Other possible treatments for scabies include: --- Crotamiton/ Eurax --Lindane/Hexit

## 2015-01-11 NOTE — Telephone Encounter (Signed)
Call placed to pharmacy. Was advised that with insurance, co-pay for Permethrin is $44.87, but with out insurnace, out of pocket cost is $109.99.  [facility]. Was advised by technician, Trula Orehristina that Permethrin is a non-formulary medication, therefore it falls into Tier 3 pricing with higher deductibles.   Please advise.

## 2015-01-12 NOTE — Telephone Encounter (Signed)
Call placed to patient and patient made aware.  

## 2015-01-18 ENCOUNTER — Ambulatory Visit (INDEPENDENT_AMBULATORY_CARE_PROVIDER_SITE_OTHER): Payer: Medicare Other | Admitting: Cardiology

## 2015-01-18 ENCOUNTER — Encounter: Payer: Self-pay | Admitting: Cardiology

## 2015-01-18 VITALS — BP 116/78 | HR 49 | Ht 66.0 in | Wt 164.0 lb

## 2015-01-18 DIAGNOSIS — I10 Essential (primary) hypertension: Secondary | ICD-10-CM

## 2015-01-18 DIAGNOSIS — R0602 Shortness of breath: Secondary | ICD-10-CM | POA: Diagnosis not present

## 2015-01-18 DIAGNOSIS — R001 Bradycardia, unspecified: Secondary | ICD-10-CM

## 2015-01-18 NOTE — Progress Notes (Signed)
Cardiology Office Note  Date: 01/18/2015   ID: Wendy Newman, DOB 1941-09-04, MRN [id number]  PCP: Frazier RichardsIXON,MARY BETH, PA-C  Primary Cardiologist: Wendy Newman , MD   Chief Complaint  Patient presents with  . Abnormal ECG    History of Present Illness: Wendy Newman is a 74 y.o. female referred by Ms. Antonieta Ibaixon PA-C for cardiology consultation. Records indicate that she recently underwent a colonoscopy with Dr. Jena Gaussourk, done with anesthesia assistance secondary to reported previous sedation-induced bradycardia. She had an ECG done during this procedure which showed sinus bradycardia at 57 bpm, otherwise decreased anteroseptal R-wave progression and no other significant abnormalities on my review.  She does not report any known history of cardiac arrhythmias, cardiomyopathy, or CAD. She states that she does get tired sometimes when she is cleaning the house, has to stop and rest. She does not have any exertional chest pain on a regular basis, sometimes feels "gas" discomfort in her chest. She has not had any episodes where she felt suddenly dizzy or had syncope.  Family history does include heart disease and a brother and her mother.   Past Medical History  Diagnosis Date  . Essential hypertension   . Anxiety   . Osteoporosis   . Edema of both legs   . Cataract   . Vitamin D deficiency     Past Surgical History  Procedure Laterality Date  . Cesarean section      X 3  . Abdominal hysterectomy      Complete  . Colonoscopy  10/10/2008    WUJ:[id number]:incomplete  . Colonoscopy with propofol N/A 12/21/2014    Procedure: COLONOSCOPY WITH PROPOFOL; IN CECUM AT 1220;  Surgeon: Corbin Adeobert M Rourk, MD;  Location: [facility];  Service: Endoscopy;  Laterality: N/A;    Current Outpatient Prescriptions  Medication Sig Dispense Refill  . calcium carbonate (OS-CAL) 600 MG TABS Take 600 mg by mouth 2 (two) times daily with a meal.    . cholecalciferol (VITAMIN D) 1000 UNITS tablet Take 1,000 Units by  mouth daily.     . citalopram (CELEXA) 20 MG tablet Take 1 tablet (20 mg total) by mouth daily. 90 tablet 0  . fexofenadine (ALLEGRA) 180 MG tablet Take 180 mg by mouth daily as needed for allergies.     . hydrochlorothiazide (HYDRODIURIL) 25 MG tablet Take 1 tablet (25 mg total) by mouth daily. 90 tablet 0   No current facility-administered medications for this visit.    Allergies:  Review of patient's allergies indicates no known allergies.   Social History: The patient  reports that she has never smoked. She does not have any smokeless tobacco history on file. She reports that she does not drink alcohol or use illicit drugs.   Family History: The patient's family history includes Cancer (age of onset: 3159) in her sister; Cancer (age of onset: 4068) in her brother; Cancer (age of onset: 4669) in her mother; Colon cancer in her other; Colon polyps in her mother; Heart disease (age of onset: 1965) in her father.   ROS:  Please see the history of present illness. Otherwise, complete review of systems is positive for reflux.  All other systems are reviewed and negative.   Physical Exam: VS:  BP 116/78 mmHg  Pulse 49  Ht 5\' 6"  (1.676 m)  Wt 164 lb (74.39 kg)  BMI 26.48 kg/m2  SpO2 99%, BMI Body mass index is 26.48 kg/(m^2).  Wt Readings from Last 3 Encounters:  01/18/15 164 lb (74.39  kg)  12/25/14 165 lb (74.844 kg)  12/21/14 165 lb (74.844 kg)     General: Patient appears comfortable at rest. HEENT: Conjunctiva and lids normal, oropharynx clear. Neck: Supple, no elevated JVP or carotid bruits, no thyromegaly. Lungs: Clear to auscultation, nonlabored breathing at rest. Cardiac: Regular rate and rhythm, no S3 or significant systolic murmur, no pericardial rub. Abdomen: Soft, nontender, bowel sounds present, no guarding or rebound. Extremities: No pitting edema, distal pulses 2+. Skin: Warm and dry. Musculoskeletal: No kyphosis. Neuropsychiatric: Alert and oriented x3, affect grossly  appropriate.   ECG: Tracing from 12/18/2014 showed sinus bradycardia with decreased anteroseptal R-wave progression.  Recent Labwork: 12/18/2014: BUN 22; Creatinine 0.96; Hemoglobin 12.6; Platelets 234; Potassium 3.6; Sodium 139     Component Value Date/Time   CHOL 216* 05/18/2009 2339   TRIG 125 05/18/2009 2339   HDL 44 05/18/2009 2339   CHOLHDL 4.9 Ratio 05/18/2009 2339   VLDL 25 05/18/2009 2339   LDLCALC 147* 05/18/2009 2339    Other Studies Reviewed Today:  Chest x-ray 02/19/2011: Comparison: 01/18/2009  Findings: Heart normal. Lungs clear. There is mild hyperaeration. There is also central airway thickening and apical pleural thickening. There is also a small calcified granuloma in the left upper lobe. No pleural fluid or osseous lesions.  IMPRESSION: Chronic changes as noted previously - no active disease.   ASSESSMENT AND PLAN:  1. Sinus bradycardia, not entirely clear that this is symptom provoking however. She is not on any heart rate lowering medications. Decreased anteroseptal R-wave progression on ECG is nonspecific and could be related to lead placement. Baseline cardiac risk factors include family history and hypertension. In terms of symptoms, she does report an element of exertional fatigue and shortness of breath, no reproducible exertional chest pain. As a baseline evaluation would recommend a GXT to both assess heart rate response to activity and also as a screen for ischemic heart disease.  2. Essential hypertension, on HCTZ, blood pressure normal today.  Current medicines were reviewed at length with the patient today.   Orders Placed This Encounter  Procedures  . Exercise tolerance test    Disposition: Call with results.   Signed, Jonelle Sidle, MD, [facility] 01/18/2015 8:59 AM    [facility] at [facility] [address] [place], [place] [address] Phone: [phone]; Fax: [phone]

## 2015-01-18 NOTE — Patient Instructions (Signed)
Your physician recommends that you schedule a follow-up appointment in: to be determined after stress test    Your physician has requested that you have an exercise tolerance test. For further information please visit [email or web address]. Please also follow instruction sheet, as given.       Thank you for choosing [facility] !   

## 2015-01-25 ENCOUNTER — Ambulatory Visit (HOSPITAL_COMMUNITY)
Admission: RE | Admit: 2015-01-25 | Discharge: 2015-01-25 | Disposition: A | Discharge status: Home / Self Care | Payer: Medicare Other | Source: Ambulatory Visit | Attending: Cardiology | Admitting: Cardiology

## 2015-01-25 ENCOUNTER — Encounter (HOSPITAL_COMMUNITY): Payer: Self-pay

## 2015-01-25 DIAGNOSIS — R9431 Abnormal electrocardiogram [ECG] [EKG]: Secondary | ICD-10-CM | POA: Insufficient documentation

## 2015-01-25 DIAGNOSIS — R0602 Shortness of breath: Secondary | ICD-10-CM

## 2015-01-25 DIAGNOSIS — R0600 Dyspnea, unspecified: Secondary | ICD-10-CM | POA: Diagnosis not present

## 2015-01-25 DIAGNOSIS — R001 Bradycardia, unspecified: Secondary | ICD-10-CM

## 2015-01-25 NOTE — Progress Notes (Addendum)
Stress Lab Nurses Notes - [facility]  Wyline BeadyBarbara S Mederos 01/25/2015 Reason for doing test: Dyspnea and Abnormal ECG Type of test: Regular GTX Nurse performing test: Parke PoissonPhyllis Billingsly, RN Nuclear Medicine Tech: Not Applicable Echo Tech: Not Applicable MD performing test: Branch/K.Lyman BishopLawrence NP Family MD: Frazier RichardsMary Beth Dixon PA-C Test explained and consent signed: Yes.   IV started: No IV started Symptoms:Fatigue in legs & SOB  Treatment/Intervention: None Reason test stopped: fatigue After recovery IV was: NA Patient to return to Nuc. Med at : NA Patient discharged: Home Patient's Condition upon discharge was: stable Comments: During test peak BP 166/77 & HR 118 .  Recovery BP 120/77 & HR 65 .  Symptoms resolved in recovery. Erskine SpeedBillingsley, Phyllis T   Patient exercised according to the Bruce protocol for 3 min 19 sec achieving 5.30 METs. Resting heart rate of 56 rose to max of 123, representing 82% of THR. Resting blood pressure increased from 128/62 up to 166/77. The test was stopped due to fatigue, she did not have any chest pain. Baseline EKG showed sinus bradycardia. Stress EKG showed no specific ischemic changes and no significant arrhythmias.  1. No evidence of ischemia at level of achieved stress. Patient did not reach 85% of THR (reached 82%), this may decrease the tests sensitivity to detect ischemia.   Dominga FerryJ Branch MD

## 2015-01-30 ENCOUNTER — Telehealth: Payer: Self-pay

## 2015-01-30 NOTE — Telephone Encounter (Signed)
  Patient exercised according to the Bruce protocol for 3 min 19 sec achieving 5.30 METs. Resting heart rate of 56 rose to max of 123, representing 82% of THR. Resting blood pressure increased from 128/62 up to 166/77. The test was stopped due to fatigue, she did not have any chest pain. Baseline EKG showed sinus bradycardia. Stress EKG showed no specific ischemic changes and no significant arrhythmias.  1. No evidence of ischemia at level of achieved stress. Patient did not reach 85% of THR (reached 82%), this may decrease the tests sensitivity to detect ischemia.   Dominga FerryJ Branch MD   pt notified of results

## 2015-05-09 ENCOUNTER — Other Ambulatory Visit: Payer: Self-pay | Admitting: Physician Assistant

## 2015-05-09 NOTE — Telephone Encounter (Signed)
Medication refilled per protocol. 

## 2015-06-21 ENCOUNTER — Encounter: Payer: Self-pay | Admitting: Nurse Practitioner

## 2015-06-21 ENCOUNTER — Ambulatory Visit: Payer: Medicare Other | Admitting: Nurse Practitioner

## 2015-06-21 ENCOUNTER — Telehealth: Payer: Self-pay | Admitting: Nurse Practitioner

## 2015-06-21 NOTE — Telephone Encounter (Signed)
Noted  

## 2015-06-21 NOTE — Telephone Encounter (Signed)
PATIENT WAS A NO SHOW AND LETTER SENT  °

## 2015-06-25 ENCOUNTER — Encounter: Payer: Self-pay | Admitting: Physician Assistant

## 2015-06-25 ENCOUNTER — Ambulatory Visit (INDEPENDENT_AMBULATORY_CARE_PROVIDER_SITE_OTHER): Payer: Medicare Other | Admitting: Physician Assistant

## 2015-06-25 VITALS — BP 106/68 | HR 78 | Temp 98.1°F | Resp 20 | Wt 164.0 lb

## 2015-06-25 DIAGNOSIS — Z800 Family history of malignant neoplasm of digestive organs: Secondary | ICD-10-CM

## 2015-06-25 DIAGNOSIS — F419 Anxiety disorder, unspecified: Secondary | ICD-10-CM | POA: Diagnosis not present

## 2015-06-25 DIAGNOSIS — Z8371 Family history of colonic polyps: Secondary | ICD-10-CM

## 2015-06-25 DIAGNOSIS — E559 Vitamin D deficiency, unspecified: Secondary | ICD-10-CM | POA: Diagnosis not present

## 2015-06-25 DIAGNOSIS — I10 Essential (primary) hypertension: Secondary | ICD-10-CM | POA: Diagnosis not present

## 2015-06-25 DIAGNOSIS — F411 Generalized anxiety disorder: Secondary | ICD-10-CM

## 2015-06-25 DIAGNOSIS — Z0000 Encounter for general adult medical examination without abnormal findings: Secondary | ICD-10-CM | POA: Diagnosis not present

## 2015-06-25 DIAGNOSIS — E785 Hyperlipidemia, unspecified: Secondary | ICD-10-CM

## 2015-06-25 DIAGNOSIS — M810 Age-related osteoporosis without current pathological fracture: Secondary | ICD-10-CM

## 2015-06-25 LAB — CBC WITH DIFFERENTIAL/PLATELET
BASOS ABS: 0 10*3/uL (ref 0.0–0.1)
Basophils Relative: 0 % (ref 0–1)
EOS ABS: 0.1 10*3/uL (ref 0.0–0.7)
Eosinophils Relative: 1 % (ref 0–5)
HCT: 40.6 % (ref 36.0–46.0)
Hemoglobin: 13.7 g/dL (ref 12.0–15.0)
LYMPHS ABS: 2.5 10*3/uL (ref 0.7–4.0)
Lymphocytes Relative: 31 % (ref 12–46)
MCH: 30.6 pg (ref 26.0–34.0)
MCHC: 33.7 g/dL (ref 30.0–36.0)
MCV: 90.8 fL (ref 78.0–100.0)
MPV: 10.6 fL (ref 8.6–12.4)
Monocytes Absolute: 0.6 10*3/uL (ref 0.1–1.0)
Monocytes Relative: 7 % (ref 3–12)
Neutro Abs: 5 10*3/uL (ref 1.7–7.7)
Neutrophils Relative %: 61 % (ref 43–77)
Platelets: 224 10*3/uL (ref 150–400)
RBC: 4.47 MIL/uL (ref 3.87–5.11)
RDW: 14.2 % (ref 11.5–15.5)
WBC: 8.2 10*3/uL (ref 4.0–10.5)

## 2015-06-25 LAB — LIPID PANEL
CHOL/HDL RATIO: 4.8 ratio (ref <=5.0)
Cholesterol: 200 mg/dL (ref 125–200)
HDL: 42 mg/dL — ABNORMAL LOW (ref >=46)
LDL CALC: 131 mg/dL — AB (ref <130)
TRIGLYCERIDES: 133 mg/dL (ref <150)
VLDL: 27 mg/dL (ref <30)

## 2015-06-25 LAB — COMPLETE METABOLIC PANEL WITH GFR
ALT: 10 U/L (ref 6–29)
AST: 17 U/L (ref 10–35)
Albumin: 4 g/dL (ref 3.6–5.1)
Alkaline Phosphatase: 52 U/L (ref 33–130)
BUN: 16 mg/dL (ref 7–25)
CO2: 29 mmol/L (ref 20–31)
CREATININE: 0.88 mg/dL (ref 0.60–0.93)
Calcium: 8.9 mg/dL (ref 8.6–10.4)
Chloride: 101 mmol/L (ref 98–110)
GFR, Est African American: 76 mL/min (ref >=60)
GFR, Est Non African American: 66 mL/min (ref >=60)
Glucose, Bld: 87 mg/dL (ref 70–99)
Potassium: 3.6 mmol/L (ref 3.5–5.3)
Sodium: 138 mmol/L (ref 135–146)
Total Bilirubin: 0.6 mg/dL (ref 0.2–1.2)
Total Protein: 6.6 g/dL (ref 6.1–8.1)

## 2015-06-25 LAB — TSH: TSH: 4.418 u[IU]/mL (ref 0.350–4.500)

## 2015-06-25 NOTE — Progress Notes (Signed)
Patient ID: Wendy Newman MRN: [id number], DOB: 19-Feb-1941, 74 y.o. Date of Encounter: @DATE @  Chief Complaint:  Chief Complaint  Patient presents with  . Annual Exam    CPE without PAP    HPI: 74 y.o. year old female  presents routine followup office visit.  At her  visit in 05/2013 we discussed stopping Fosamax that she had been on it for 5 years. At f/u OV she stated that she did stop that medicine. She was taking her calcium and vitamin D. At OV-05/2014-she says she is no longer taking the Vitamin D secondary to finances. Back on Vitamin D and Calcium now--Entered 12/25/2014 OV  Financial distress: I also see her husband as a patient. I had noticed that he had lost a lot of weight. I further evaluated his weight loss. Found out that they were having severe financial distress. He was eating what he could. At followup he has told me that they are now using 3 different food pantries as well as food from the church. She does not make any further comments or provide any further information regarding the financial distress today except for the fact that she cannot afford to buy over-the-counter vitamin D.  She reports that the Celexa is still working well for her and is controlling her anxiety. Is having no adverse effects. Says that she has "always been a very nervous person" but the Celexa controls this well. Does mentioned today that her 19 year old grandson is living with her and her husband now. says he was wanting to drop out of school and is living with them currently. I discussed that the Celexa does come in a higher dose but she says that this is dose works well for her and she does not need to increase the dose. Says that she mostly needs this "for her nerves" but says that this is controlled at this dose.  She has no complaints. Even with exertion she still has no chest pressure tightness or heaviness in no increased shortness of breath or dyspnea.  In past, prescribed  Linzess for constipation---then she had diarrhea.  Then prescribed Amitiza.  Today--06/25/2015---she says she is having normal BMs with no medication at all now--so not having to use any med for this.   06/25/2015:  Says this summer their son paid for a house at [place]--They got to go there with the family.  Also they went and stayed with husband's sister in [place] some.  Says she has been having pain at the back of her right heel when she walks--for about one month. She does not walk for exercise---just walks in house or to go to store etc. Pain mild.  Past Medical History  Diagnosis Date  . Essential hypertension   . Anxiety   . Osteoporosis   . Edema of both legs   . Cataract   . Vitamin D deficiency      Home Meds:  Outpatient Prescriptions Prior to Visit  Medication Sig Dispense Refill  . calcium carbonate (OS-CAL) 600 MG TABS Take 600 mg by mouth 2 (two) times daily with a meal.    . cholecalciferol (VITAMIN D) 1000 UNITS tablet Take 1,000 Units by mouth daily.     . citalopram (CELEXA) 20 MG tablet Take 1 tablet by mouth  daily 90 tablet 0  . fexofenadine (ALLEGRA) 180 MG tablet Take 180 mg by mouth daily as needed for allergies.     . hydrochlorothiazide (HYDRODIURIL) 25 MG tablet Take 1 tablet  by mouth  daily 90 tablet 0   No facility-administered medications prior to visit.     Allergies: No Known Allergies  Social History   Social History  . Marital Status: Married    Spouse Name: N/A  . Number of Children: 3  . Years of Education: N/A   Occupational History  . 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 Smoking status: Never Smoker   . Smokeless tobacco: Not on file  . Alcohol Use: No  . Drug Use: No  . Sexual Activity: Yes    Birth Control/ Protection: Surgical   Other Topics Concern  . Not on file   Social History Narrative    Family History  Problem Relation Age of Onset  . Cancer Mother 35    Breast. age 46.   Marland Kitchen  Heart disease Father 21    CABG  . Cancer Sister 41    Lung  . Cancer Brother 21    Started in the back  . Colon cancer Other     Age 71s. maternal grandmother  . Colon polyps Mother      Review of Systems:  See HPI for pertinent ROS. All other ROS negative.    Physical Exam: Blood pressure 106/68, pulse 78, temperature 98.1 F (36.7 C), temperature source Oral, resp. rate 20, weight 164 lb (74.39 kg)., Body mass index is 26.48 kg/(m^2). General: WNWD WF. Appears in no acute distress. HEENT: Eyes: Normal Exam. Ears: Normal Canals and TMs bilaterally. Oral Mucosa Normal. Poor dentition lower mouth. Has dentures upper.  Neck: Supple. No thyromegaly. No lymphadenopathy. No carotid bruit.  Lungs: Clear bilaterally to auscultation without wheezes, rales, or rhonchi. Breathing is unlabored. Heart: RRR with S1 S2. No murmurs, rubs, or gallops. Breast: Normal bilaterally. No masses. No skin changes. No nipple discharge. Abdomen: Soft, non-tender, non-distended with normoactive bowel sounds. No hepatomegaly. No rebound/guarding. No obvious abdominal masses. Musculoskeletal:  Strength and tone normal for age.Full ROM of all joints.  Severe tenderness at back of heel, where achilles tendon inserts.  Extremities/Skin: Warm and dry.  No edema.  No rash or suspicious lesions.  Neuro: Alert and oriented X 3. Moves all extremities spontaneously. Gait is normal. CNII-XII grossly in tact. Psych:  Responds to questions appropriately with a normal affect.     ASSESSMENT AND PLAN:  74 y.o. year old female with    1. Visit for preventive health examination - CBC with Differential/Platelet - COMPLETE METABOLIC PANEL WITH GFR - Lipid panel - TSH - Vit D  25 hydroxy (rtn osteoporosis monitoring)   Achilles Tendonitis Demonstrated Stretches to do. Take otc NSAID regularly for 10 days She states she cannot afford to see a Podiatrist.  If pain does not improve with stretches and NSAIDs, will see  if Dr. Tanya Nones can do injection to that site or not.  Also, pt not interested in injection now--she is concerned "injection would be painful"  1. HYPERTENSION Blood pressure is well controlled. Continue current medication. Check lab to monitor  2. ANXIETY Anxiety is controlled with current dose of Celexa. Continue current medication.  3. POSTMENOPAUSAL OSTEOPOROSIS At her visit 06/16/2013 we discussed that Fosamax should be stopped after being on that therapy for 5 years. She has started Fosamax November 2009. Therefore was do to stop medication November 2014. She did stop the medication at that time. At OV 05/2014--She was still taking calcium but not currently taking  vitamin D sec to finances.  At office visit 11/2014 she states that she is back on the vitamin D and also still taking calcium  4. Vitamin D deficiency Was on Vit D in past then at OV 2015--- she said that she was no longer taking this because of financial distress At office visit 11/2014 she reports that she is back on the vitamin D At OV 05/2015---says still taking Vitamin D  6. Lipids--she had fasting lipid profile 05/2010 and Lipid Panel was Favorable at that time. 05/2015---She is fasting so will recheck now.  7. Mammogram: Patient states that she has had this annually. She says that she has her mammogram at [facility] every November.  8. colonoscopy: Patient reports she had this  by Dr. Kendell Bane --  February 2016  Pelvic Exam/ Pap Smear---Pt defers Pelvic Exam. Age 60.  9. Immunizations: Tetanus: Patient checked regarding cost and he was insurance the cost would be $40 so she has deferred.--11/2014--I gave this to Selena Batten to put into Epic--so will not cont to show up on Report Pneumonia vaccine: Pneumovax given 06/16/2012.                                  Prevnar 13 given here 06/21/2014 Zostavax: She has checked with her insurance regarding her cost and it is too expensive so she has deferred.--11/2014--I gave this to Selena Batten to  put into Epic--so will not cont to show up on Report  Subjective:   Patient presents for Medicare Annual/Subsequent preventive examination.   Review Past Medical/Family/Social: This is all documented in Epic and above   Risk Factors  Current exercise habits:   None except walking around in house, going to grocery store etc Dietary issues discussed:  Compliance limited by finances  Cardiac risk factors: HTN, HLD, Age  Depression Screen  (Note: if answer to either of the following is "Yes", a more complete depression screening is indicated)  Over the past two weeks, have you felt down, depressed or hopeless? No Over the past two weeks, have you felt little interest or pleasure in doing things? No Have you lost interest or pleasure in daily life? No Do you often feel hopeless? No Do you cry easily over simple problems? No   Activities of Daily Living  In your present state of health, do you have any difficulty performing the following activities?:  Driving? No  Managing money? No  Feeding yourself? No  Getting from bed to chair? No  Climbing a flight of stairs? No  Preparing food and eating?: No  Bathing or showering? No  Getting dressed: No  Getting to the toilet? No  Using the toilet:No  Moving around from place to place: No  In the past year have you fallen or had a near fall?:No  Are you sexually active? No  Do you have more than one partner? No   Hearing Difficulties: No  Do you often ask people to speak up or repeat themselves? No  Do you experience ringing or noises in your ears? No Do you have difficulty understanding soft or whispered voices? No  Do you feel that you have a problem with memory? No Do you often misplace items? No  Do you feel safe at home? Yes  Cognitive Testing  Alert? Yes Normal Appearance?Yes  Oriented to person? Yes Place? Yes  Time? Yes  Recall of three objects? Yes  Can perform simple calculations? Yes  Displays appropriate judgment?Yes  Can read the correct time from a watch face?Yes   List the Names of Other Physician/Practitioners you currently use:  GI-Dr. Teola Bradley any recent Medical Services you may have received from other than [facility] providers in the past year (date may be approximate).  None  Screening Tests / Date---This is all documented above. Colonoscopy                     Zostavax  Mammogram  Influenza Vaccine  Tetanus/tdap    Assessment:    Annual wellness medicare exam   Plan:    During the course of the visit the patient was educated and counseled about appropriate screening and preventive services including:  Screening mammography  Colorectal cancer screening  Shingles vaccine. Prescription given to that she can get the vaccine at the pharmacy or Medicare part D.  Screen + for depression. PHQ- 9 score of 12 (moderate depression). We discussed the options of counseling versus possibly a medication. I encouraged her strongly think about the counseling. She is going through some medical problems currently and her husband is as well Mrs. been very stressful for her. She says she will think about it. She does have Xanax to use as needed. Though she may benefit from an SSRI for her more depressive type symptoms but she wants to hold off at this time.  I aksed her to please have her cardioloist send records since we have none on file.  Diet review for nutrition referral? Yes ____ Not Indicated __x__  Patient Instructions (the written plan) was given to the patient.  Medicare Attestation  I have personally reviewed:  The patient's medical and social history  Their use of alcohol, tobacco or illicit drugs  Their current medications and supplements  The patient's functional ability including ADLs,fall risks, home safety risks, cognitive, and hearing and visual impairment  Diet and physical activities  Evidence for depression or mood disorders  The patient's weight, height, BMI, and visual  acuity have been recorded in the chart. I have made referrals, counseling, and provided education to the patient based on review of the above and I have provided the patient with a written personalized care plan for preventive services.       Routine office visit 6 months or sooner if needed.  Murray Hodgkins [place], [place], [facility] 06/25/2015 9:38 AM

## 2015-06-26 LAB — VITAMIN D 25 HYDROXY (VIT D DEFICIENCY, FRACTURES): Vit D, 25-Hydroxy: 42 ng/mL (ref 30–100)

## 2015-06-28 ENCOUNTER — Ambulatory Visit (INDEPENDENT_AMBULATORY_CARE_PROVIDER_SITE_OTHER): Payer: Medicare Other | Admitting: Gastroenterology

## 2015-06-28 ENCOUNTER — Encounter: Payer: Self-pay | Admitting: Gastroenterology

## 2015-06-28 VITALS — BP 130/76 | HR 61 | Temp 97.6°F | Ht 66.0 in | Wt 166.2 lb

## 2015-06-28 DIAGNOSIS — K5900 Constipation, unspecified: Secondary | ICD-10-CM

## 2015-06-28 NOTE — Progress Notes (Signed)
Referring Provider: Deon Pilling Primary Care Physician:  Frazier Richards, PA-C  Primary GI: Dr. Jena Gauss   Chief Complaint  Patient presents with  . Follow-up    HPI:   Wendy Newman is a 74 y.o. female presenting today with a history of chronic constipation and difficult colonoscopy in 2009 secondary to bradycardia and hypotension. Recent colonoscopy earlier this year with Propofol complete cecum. No polyps. Due to family history of polyps and colon cancer, will need screening every 5 years. Here for routine follow-up.  Not on Linzess anymore. BM every day to every other day. Linzess caused diarrhea. No abdominal pain. No N/V. No significant upper GI symptoms. Does not want any prescriptive agents. Not taking fiber.     Past Medical History  Diagnosis Date  . Essential hypertension   . Anxiety   . Osteoporosis   . Edema of both legs   . Cataract   . Vitamin D deficiency     Past Surgical History  Procedure Laterality Date  . Cesarean section      X 3  . Abdominal hysterectomy      Complete  . Colonoscopy  10/10/2008    [id number]  . Colonoscopy with propofol N/A 12/21/2014    Dr. Rourk:noncompliant left colon and redundant right colon, rectal and colonic mucosa appeared normal.     Current Outpatient Prescriptions  Medication Sig Dispense Refill  . calcium carbonate (OS-CAL) 600 MG TABS Take 600 mg by mouth 2 (two) times daily with a meal.    . cholecalciferol (VITAMIN D) 1000 UNITS tablet Take 1,000 Units by mouth daily.     . citalopram (CELEXA) 20 MG tablet Take 1 tablet by mouth  daily 90 tablet 0  . fexofenadine (ALLEGRA) 180 MG tablet Take 180 mg by mouth daily as needed for allergies.     . hydrochlorothiazide (HYDRODIURIL) 25 MG tablet Take 1 tablet by mouth  daily 90 tablet 0   No current facility-administered medications for this visit.    Allergies as of 06/28/2015  . (No Known Allergies)    Family History  Problem Relation Age of  Onset  . Cancer Mother 23    Breast. age 55.   Marland Kitchen Heart disease Father 28    CABG  . Cancer Sister 33    Lung  . Cancer Brother 92    Started in the back  . Colon cancer Other     Age 9s. maternal grandmother  . Colon polyps Mother     Social History   Social History  . Marital Status: Married    Spouse Name: N/A  . Number of Children: 3  . Years of Education: N/A   Occupational History  . 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 Smoking status: Never Smoker   . Smokeless tobacco: None  . Alcohol Use: No  . Drug Use: No  . Sexual Activity: Yes    Birth Control/ Protection: Surgical   Other Topics Concern  . None   Social History Narrative    Review of Systems: As mentioned in HPI  Physical Exam: BP 130/76 mmHg  Pulse 61  Temp(Src) 97.6 F (36.4 C)  Ht  (1.676 m)  Wt 166 lb 3.2 oz (75.388 kg)  BMI 26.84 kg/m2 General:   Alert and oriented. No distress noted. Pleasant and cooperative.  Head:  Normocephalic and atraumatic. Eyes:  Conjuctiva clear without scleral icterus. Mouth:  Oral mucosa  pink and moist. Good dentition. No lesions. Abdomen:  +BS, soft, non-tender and non-distended. No rebound or guarding. No HSM or masses noted. Msk:  Symmetrical without gross deformities. Normal posture. Extremities:  Without edema. Neurologic:  Alert and  oriented x4;  grossly normal neurologically. Psych:  Alert and cooperative. Normal mood and affect.

## 2015-06-28 NOTE — Progress Notes (Signed)
CC'ED TO PCP 

## 2015-06-28 NOTE — Patient Instructions (Signed)
Please call if you feel you need a prescription strength constipation medication.   Start taking Metamucil or Benefiber daily for good colon health and to help with constipation.   We will see you back in 1 year or sooner if needed.   Next colonoscopy in 2021.

## 2015-06-28 NOTE — Assessment & Plan Note (Signed)
74 year old female with chronic constipation, doing well without prescriptive agents. Recommend adding supplemental fiber daily in the way of Metamucil or Benefiber. Return in 1 year or sooner as indicated. Next colonoscopy 2021.

## 2015-07-12 ENCOUNTER — Other Ambulatory Visit: Payer: Self-pay | Admitting: Physician Assistant

## 2015-07-12 NOTE — Telephone Encounter (Signed)
Medication refilled per protocol. 

## 2015-08-28 ENCOUNTER — Other Ambulatory Visit: Payer: Self-pay | Admitting: Physician Assistant

## 2015-08-28 DIAGNOSIS — Z1231 Encounter for screening mammogram for malignant neoplasm of breast: Secondary | ICD-10-CM

## 2015-09-27 ENCOUNTER — Ambulatory Visit (HOSPITAL_COMMUNITY)
Admission: RE | Admit: 2015-09-27 | Discharge: 2015-09-27 | Disposition: A | Discharge status: Home / Self Care | Payer: Medicare Other | Source: Ambulatory Visit | Attending: Physician Assistant | Admitting: Physician Assistant

## 2015-09-27 DIAGNOSIS — Z1231 Encounter for screening mammogram for malignant neoplasm of breast: Secondary | ICD-10-CM | POA: Insufficient documentation

## 2015-10-17 ENCOUNTER — Ambulatory Visit (INDEPENDENT_AMBULATORY_CARE_PROVIDER_SITE_OTHER): Payer: Medicare Other | Admitting: Family Medicine

## 2015-10-17 DIAGNOSIS — Z23 Encounter for immunization: Secondary | ICD-10-CM | POA: Diagnosis not present

## 2015-10-31 ENCOUNTER — Other Ambulatory Visit: Payer: Self-pay | Admitting: Physician Assistant

## 2015-10-31 NOTE — Telephone Encounter (Signed)
Medication refilled per protocol. 

## 2015-12-26 ENCOUNTER — Ambulatory Visit (INDEPENDENT_AMBULATORY_CARE_PROVIDER_SITE_OTHER): Payer: Medicare Other | Admitting: Physician Assistant

## 2015-12-26 ENCOUNTER — Encounter: Payer: Self-pay | Admitting: Physician Assistant

## 2015-12-26 VITALS — BP 120/72 | HR 70 | Temp 97.8°F | Resp 20 | Wt 175.0 lb

## 2015-12-26 DIAGNOSIS — K5900 Constipation, unspecified: Secondary | ICD-10-CM

## 2015-12-26 DIAGNOSIS — E785 Hyperlipidemia, unspecified: Secondary | ICD-10-CM | POA: Diagnosis not present

## 2015-12-26 DIAGNOSIS — M810 Age-related osteoporosis without current pathological fracture: Secondary | ICD-10-CM | POA: Diagnosis not present

## 2015-12-26 DIAGNOSIS — E559 Vitamin D deficiency, unspecified: Secondary | ICD-10-CM

## 2015-12-26 DIAGNOSIS — I10 Essential (primary) hypertension: Secondary | ICD-10-CM | POA: Diagnosis not present

## 2015-12-26 DIAGNOSIS — Z800 Family history of malignant neoplasm of digestive organs: Secondary | ICD-10-CM

## 2015-12-26 DIAGNOSIS — F419 Anxiety disorder, unspecified: Secondary | ICD-10-CM | POA: Diagnosis not present

## 2015-12-26 DIAGNOSIS — F411 Generalized anxiety disorder: Secondary | ICD-10-CM | POA: Diagnosis not present

## 2015-12-26 LAB — BASIC METABOLIC PANEL WITH GFR
BUN: 19 mg/dL (ref 7–25)
CHLORIDE: 101 mmol/L (ref 98–110)
CO2: 27 mmol/L (ref 20–31)
Calcium: 9 mg/dL (ref 8.6–10.4)
Creat: 0.97 mg/dL — ABNORMAL HIGH (ref 0.60–0.93)
GFR, EST NON AFRICAN AMERICAN: 59 mL/min — AB (ref >=60)
GFR, Est African American: 67 mL/min (ref >=60)
GLUCOSE: 90 mg/dL (ref 70–99)
Potassium: 3.9 mmol/L (ref 3.5–5.3)
Sodium: 141 mmol/L (ref 135–146)

## 2015-12-26 NOTE — Progress Notes (Signed)
Patient ID: Wendy Newman MRN: [id number], DOB: 1941-10-11, 75 y.o. Date of Encounter: @DATE @  Chief Complaint:  Chief Complaint  Patient presents with  . Follow-up    6 mos f/u, no changes    HPI: 75 y.o. year old female  presents routine followup office visit.  At her  visit in 05/2013 we discussed stopping Fosamax that she had been on it for 5 years. At f/u OV she stated that she did stop that medicine. She was taking her calcium and vitamin D. At OV-05/2014-she says she is no longer taking the Vitamin D secondary to finances. Back on Vitamin D and Calcium now--Entered 12/25/2014 OV  Financial distress: I also see her husband as a patient. I had noticed that he had lost a lot of weight. I further evaluated his weight loss. Found out that they were having severe financial distress. He was eating what he could. At followup he has told me that they are now using 3 different food pantries as well as food from the church. She does not make any further comments or provide any further information regarding the financial distress today except for the fact that she cannot afford to buy over-the-counter vitamin D.  She reports that the Celexa is still working well for her and is controlling her anxiety. Is having no adverse effects. Says that she has "always been a very nervous person" but the Celexa controls this well. Does mentioned today that her 44 year old grandson is living with her and her husband now. says he was wanting to drop out of school and is living with them currently. I discussed that the Celexa does come in a higher dose but she says that this is dose works well for her and she does not need to increase the dose. Says that she mostly needs this "for her nerves" but says that this is controlled at this dose.  She has no complaints. Even with exertion she still has no chest pressure tightness or heaviness in no increased shortness of breath or dyspnea.  In past, prescribed  Linzess for constipation---then she had diarrhea.  Then prescribed Amitiza.  At f/u---she says she is having normal BMs with no medication at all now--so not having to use any med for this.    Past Medical History  Diagnosis Date  . Essential hypertension   . Anxiety   . Osteoporosis   . Edema of both legs   . Cataract   . Vitamin D deficiency      Home Meds:  Outpatient Prescriptions Prior to Visit  Medication Sig Dispense Refill  . calcium carbonate (OS-CAL) 600 MG TABS Take 600 mg by mouth 2 (two) times daily with a meal.    . cholecalciferol (VITAMIN D) 1000 UNITS tablet Take 1,000 Units by mouth daily.     . citalopram (CELEXA) 20 MG tablet Take 1 tablet by mouth  daily 90 tablet 0  . fexofenadine (ALLEGRA) 180 MG tablet Take 180 mg by mouth daily as needed for allergies.     . hydrochlorothiazide (HYDRODIURIL) 25 MG tablet Take 1 tablet by mouth  daily 90 tablet 0   No facility-administered medications prior to visit.     Allergies: No Known Allergies  Social History   Social History  . Marital Status: Married    Spouse Name: N/A  . Number of Children: 3  . Years of Education: N/A   Occupational History  . 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 Smoking status: Never Smoker   . Smokeless tobacco: Not on file  . Alcohol Use: No  . Drug Use: No  . Sexual Activity: Yes    Birth Control/ Protection: Surgical   Other Topics Concern  . Not on file   Social History Narrative    Family History  Problem Relation Age of Onset  . Cancer Mother 24    Breast. age 21.   Marland Kitchen Heart disease Father 2    CABG  . Cancer Sister 61    Lung  . Cancer Brother 86    Started in the back  . Colon cancer Other     Age 67s. maternal grandmother  . Colon polyps Mother      Review of Systems:  See HPI for pertinent ROS. All other ROS negative.    Physical Exam: Blood pressure 120/72, pulse 70, temperature 97.8 F (36.6 C), temperature source  Oral, resp. rate 20, weight 175 lb (79.379 kg)., Body mass index is 28.26 kg/(m^2). General: WNWD WF. Appears in no acute distress. HEENT: Eyes: Normal Exam. Ears: Normal Canals and TMs bilaterally. Oral Mucosa Normal. Poor dentition lower mouth. Has dentures upper.  Neck: Supple. No thyromegaly. No lymphadenopathy. No carotid bruit.  Lungs: Clear bilaterally to auscultation without wheezes, rales, or rhonchi. Breathing is unlabored. Heart: RRR with S1 S2. No murmurs, rubs, or gallops. Breast: Normal bilaterally. No masses. No skin changes. No nipple discharge. Abdomen: Soft, non-tender, non-distended with normoactive bowel sounds. No hepatomegaly. No rebound/guarding. No obvious abdominal masses. Musculoskeletal:  Strength and tone normal for age.Full ROM of all joints.  Severe tenderness at back of heel, where achilles tendon inserts.  Extremities/Skin: Warm and dry.  No edema.  No rash or suspicious lesions.  Neuro: Alert and oriented X 3. Moves all extremities spontaneously. Gait is normal. CNII-XII grossly in tact. Psych:  Responds to questions appropriately with a normal affect.     ASSESSMENT AND PLAN:  75 y.o. year old female with    1. HYPERTENSION Blood pressure is well controlled. Continue current medication. Check lab to monitor  2. ANXIETY Anxiety is controlled with current dose of Celexa. Continue current medication.  3. POSTMENOPAUSAL OSTEOPOROSIS At her visit 06/16/2013 we discussed that Fosamax should be stopped after being on that therapy for 5 years. She has started Fosamax November 2009. Therefore was do to stop medication November 2014. She did stop the medication at that time. At OV 05/2014--She was still taking calcium but not currently taking  vitamin D sec to finances.  At office visit 11/2014 she states that she is back on the vitamin D and also still taking calcium  THE FOLLOWING INFO IS COPIED FROM HER CPE 06/25/2015:  Screening Labs:  She had screening labs  at CPE 06/25/2015--all normal.  Visit for preventive health examination - CBC with Differential/Platelet - COMPLETE METABOLIC PANEL WITH GFR - Lipid panel - TSH - Vit D  25 hydroxy (rtn osteoporosis monitoring)    Vitamin D deficiency Was on Vit D in past then at OV 2015--- she said that she was no longer taking this because of financial distress At office visit 11/2014 she reports that she is back on the vitamin D At OV 05/2015---says still taking Vitamin D   Lipids--she had fasting lipid profile 05/2010 and Lipid Panel was Favorable at that time. 05/2015---She is fasting so will recheck now.   Mammogram: Patient states that she has had this annually. She says that she has  her mammogram at [facility] every November.  Colonoscopy: Patient reports she had this  by Dr. Kendell Bane --  February 2016  Pelvic Exam/ Pap Smear---Pt defers Pelvic Exam. Age 59.   Immunizations: Tetanus: Patient checked regarding cost and he was insurance the cost would be $40 so she has deferred.--11/2014--I gave this to Selena Batten to put into Epic--so will not cont to show up on Report Pneumonia vaccine: Pneumovax given 06/16/2012.                                  Prevnar 13 given here 06/21/2014 Zostavax: She has checked with her insurance regarding her cost and it is too expensive so she has deferred.--11/2014--I gave this to Selena Batten to put into Epic--so will not cont to show up on Report   Routine office visit 6 months or sooner if needed.  Signed, [address] [place], [place], [facility] 12/26/2015 9:29 AM

## 2016-01-23 ENCOUNTER — Telehealth: Payer: Self-pay | Admitting: Physician Assistant

## 2016-01-23 MED ORDER — CITALOPRAM HYDROBROMIDE 20 MG PO TABS: ORAL_TABLET | ORAL | Status: DC

## 2016-01-23 MED ORDER — HYDROCHLOROTHIAZIDE 25 MG PO TABS: ORAL_TABLET | ORAL | Status: DC

## 2016-01-23 NOTE — Telephone Encounter (Signed)
Patient calling to get [facility] for the following  Citalopram  And hydrochlorothiazide Fax is [phone]

## 2016-01-23 NOTE — Telephone Encounter (Signed)
Medication refilled per protocol. 

## 2016-03-13 ENCOUNTER — Emergency Department (HOSPITAL_COMMUNITY)
Admission: EM | Admit: 2016-03-13 | Discharge: 2016-03-13 | Disposition: A | Discharge status: Home / Self Care | Payer: Medicare Other | Attending: Emergency Medicine | Admitting: Emergency Medicine

## 2016-03-13 ENCOUNTER — Encounter (HOSPITAL_COMMUNITY): Payer: Self-pay | Admitting: Emergency Medicine

## 2016-03-13 DIAGNOSIS — I10 Essential (primary) hypertension: Secondary | ICD-10-CM | POA: Diagnosis not present

## 2016-03-13 DIAGNOSIS — Y999 Unspecified external cause status: Secondary | ICD-10-CM | POA: Insufficient documentation

## 2016-03-13 DIAGNOSIS — Y939 Activity, unspecified: Secondary | ICD-10-CM | POA: Diagnosis not present

## 2016-03-13 DIAGNOSIS — W57XXXA Bitten or stung by nonvenomous insect and other nonvenomous arthropods, initial encounter: Secondary | ICD-10-CM | POA: Diagnosis not present

## 2016-03-13 DIAGNOSIS — S30861A Insect bite (nonvenomous) of abdominal wall, initial encounter: Secondary | ICD-10-CM | POA: Diagnosis not present

## 2016-03-13 DIAGNOSIS — Y929 Unspecified place or not applicable: Secondary | ICD-10-CM | POA: Diagnosis not present

## 2016-03-13 DIAGNOSIS — M810 Age-related osteoporosis without current pathological fracture: Secondary | ICD-10-CM | POA: Insufficient documentation

## 2016-03-13 NOTE — ED Notes (Signed)
Patient's husband removed small tick from patient's left mid abdomen. +Itching. No other complaints. Worries there is some tick products left in skin.

## 2016-03-13 NOTE — Discharge Instructions (Signed)
Tick Bite Information Ticks are insects that attach themselves to the skin and draw blood for food. There are various types of ticks. Common types include wood ticks and deer ticks. Most ticks live in shrubs and grassy areas. Ticks can climb onto your body when you make contact with leaves or grass where the tick is waiting. The most common places on the body for ticks to attach themselves are the scalp, neck, armpits, waist, and groin. Most tick bites are harmless, but sometimes ticks carry germs that cause diseases. These germs can be spread to a person during the tick's feeding process. The chance of a disease spreading through a tick bite depends on:   The type of tick.  Time of year.   How long the tick is attached.   Geographic location.  HOW CAN YOU PREVENT TICK BITES? Take these steps to help prevent tick bites when you are outdoors:  Wear protective clothing. Long sleeves and long pants are best.   Wear white clothes so you can see ticks more easily.  Tuck your pant legs into your socks.   If walking on a trail, stay in the middle of the trail to avoid brushing against bushes.  Avoid walking through areas with long grass.  Put insect repellent on all exposed skin and along boot tops, pant legs, and sleeve cuffs.   Check clothing, hair, and skin repeatedly and before going inside.   Brush off any ticks that are not attached.  Take a shower or bath as soon as possible after being outdoors.  WHAT IS THE PROPER WAY TO REMOVE A TICK? Ticks should be removed as soon as possible to help prevent diseases caused by tick bites. 1. If latex gloves are available, put them on before trying to remove a tick.  2. Using fine-point tweezers, grasp the tick as close to the skin as possible. You may also use curved forceps or a tick removal tool. Grasp the tick as close to its head as possible. Avoid grasping the tick on its body. 3. Pull gently with steady upward pressure until  the tick lets go. Do not twist the tick or jerk it suddenly. This may break off the tick's head or mouth parts. 4. Do not squeeze or crush the tick's body. This could force disease-carrying fluids from the tick into your body.  5. After the tick is removed, wash the bite area and your hands with soap and water or other disinfectant such as alcohol. 6. Apply a small amount of antiseptic cream or ointment to the bite site.  7. Wash and disinfect any instruments that were used.  Do not try to remove a tick by applying a hot match, petroleum jelly, or fingernail polish to the tick. These methods do not work and may increase the chances of disease being spread from the tick bite.  WHEN SHOULD YOU SEEK MEDICAL CARE? Contact your health care provider if you are unable to remove a tick from your skin or if a part of the tick breaks off and is stuck in the skin.  After a tick bite, you need to be aware of signs and symptoms that could be related to diseases spread by ticks. Contact your health care provider if you develop any of the following in the days or weeks after the tick bite:  Unexplained fever.  Rash. A circular rash that appears days or weeks after the tick bite may indicate the possibility of Lyme disease. The rash may resemble   a target with a bull's-eye and may occur at a different part of your body than the tick bite.  Redness and swelling in the area of the tick bite.   Tender, swollen lymph glands.   Diarrhea.   Weight loss.   Cough.   Fatigue.   Muscle, joint, or bone pain.   Abdominal pain.   Headache.   Lethargy or a change in your level of consciousness.  Difficulty walking or moving your legs.   Numbness in the legs.   Paralysis.  Shortness of breath.   Confusion.   Repeated vomiting.    This information is not intended to replace advice given to you by your health care provider. Make sure you discuss any questions you have with your health  care provider.   Document Released: 10/10/2000 Document Revised: 11/03/2014 Document Reviewed: 03/23/2013 Elsevier Interactive Patient Education 2016 [employer]  

## 2016-03-13 NOTE — ED Notes (Signed)
Patient with no complaints at this time. Respirations even and unlabored. Skin warm/dry. Discharge instructions reviewed with patient at this time. Patient given opportunity to voice concerns/ask questions. Patient discharged at this time and left Emergency Department with steady gait.   

## 2016-03-16 NOTE — ED Provider Notes (Signed)
CSN: [id number]     Arrival date & time 03/13/16  0930 History   First MD Initiated Contact with Patient 03/13/16 [phone]     Chief Complaint  Patient presents with  . Tick Bite      (Consider location/radiation/quality/duration/timing/severity/associated sxs/prior Treatment) HPI   Wendy Newman is a 75 y.o. female who presents to the Emergency Department complaining of tick bite to her abdomen.  She states that she noticed shortly before ED arrival.  She states that her husband removed the tick, but she is concerned that part of the tick is still attached.  She reports itching to the area, but denies pain, rash, fever, chills and joint pains.    Past Medical History  Diagnosis Date  . Essential hypertension   . Anxiety   . Osteoporosis   . Edema of both legs   . Cataract   . Vitamin D deficiency    Past Surgical History  Procedure Laterality Date  . Cesarean section      X 3  . Abdominal hysterectomy      Complete  . Colonoscopy  10/10/2008    [id number]  . Colonoscopy with propofol N/A 12/21/2014    Dr. Rourk:noncompliant left colon and redundant right colon, rectal and colonic mucosa appeared normal.    Family History  Problem Relation Age of Onset  . Cancer Mother 50    Breast. age 47.   Marland Kitchen Heart disease Father 64    CABG  . Cancer Sister 71    Lung  . Cancer Brother 57    Started in the back  . Colon cancer Other     Age 72s. maternal grandmother  . Colon polyps Mother    Social History  Substance Use Topics  . Smoking status: Never Smoker   . Smokeless tobacco: None  . Alcohol Use: No   OB History    No data available     Review of Systems  Constitutional: Negative for fever and chills.  Gastrointestinal: Negative for nausea and vomiting.  Musculoskeletal: Negative for myalgias, joint swelling and arthralgias.  Skin: Negative for rash.       Tick bite to abdomen  Neurological: Negative for headaches.      Allergies  Review of patient's  allergies indicates no known allergies.  Home Medications   Prior to Admission medications   Medication Sig Start Date End Date Taking? Authorizing Provider  calcium carbonate (OS-CAL) 600 MG TABS Take 600 mg by mouth 2 (two) times daily with a meal.    Historical Provider, MD  cholecalciferol (VITAMIN D) 1000 UNITS tablet Take 1,000 Units by mouth daily.     Historical Provider, MD  citalopram (CELEXA) 20 MG tablet Take 1 tablet by mouth  daily 01/23/16   Dorena Bodo, PA-C  fexofenadine (ALLEGRA) 180 MG tablet Take 180 mg by mouth daily as needed for allergies.     Historical Provider, MD  hydrochlorothiazide (HYDRODIURIL) 25 MG tablet Take 1 tablet by mouth  daily 01/23/16   Dorena Bodo, PA-C   BP 138/62 mmHg  Pulse 66  Temp(Src) 98.2 F (36.8 C) (Oral)  Resp 16  Ht  (1.676 m)  Wt 78.019 kg  BMI 27.77 kg/m2  SpO2 97% Physical Exam  Constitutional: She is oriented to person, place, and time.  Cardiovascular: Normal rate and regular rhythm.   Pulmonary/Chest: Effort normal and breath sounds normal. No respiratory distress.  Musculoskeletal: Normal range of motion.  Neurological: She is alert and  oriented to person, place, and time.  Skin: Skin is warm. No rash noted. No erythema.  Small tick fragment superficially to skin of the left abdomen.  No edema or erythema.  Nursing note and vitals reviewed.   ED Course  Procedures (including critical care time) Labs Review Labs Reviewed - No data to display  Imaging Review No results found. I have personally reviewed and evaluated these images and lab results as part of my medical decision-making.   EKG Interpretation None      MDM   Final diagnoses:  Tick bite of abdomen, initial encounter    Small fragment of tick remains attached to skin of left abdomen.    Easily removed by me using 18 g needle.  Pt tolerated well.  Advised of tick bite precautions and ER return if needed.    Pauline Ausammy , PA-C 03/16/16  2045  Marily MemosJason Mesner, MD 03/18/16 [id number]

## 2016-05-29 ENCOUNTER — Encounter: Payer: Self-pay | Admitting: Internal Medicine

## 2016-06-09 ENCOUNTER — Encounter (HOSPITAL_COMMUNITY): Payer: Self-pay | Admitting: Emergency Medicine

## 2016-06-09 ENCOUNTER — Emergency Department (HOSPITAL_COMMUNITY)
Admission: EM | Admit: 2016-06-09 | Discharge: 2016-06-09 | Disposition: A | Discharge status: Home / Self Care | Payer: Medicare Other | Attending: Emergency Medicine | Admitting: Emergency Medicine

## 2016-06-09 DIAGNOSIS — Y929 Unspecified place or not applicable: Secondary | ICD-10-CM | POA: Diagnosis not present

## 2016-06-09 DIAGNOSIS — Y999 Unspecified external cause status: Secondary | ICD-10-CM | POA: Diagnosis not present

## 2016-06-09 DIAGNOSIS — Y939 Activity, unspecified: Secondary | ICD-10-CM | POA: Insufficient documentation

## 2016-06-09 DIAGNOSIS — W57XXXA Bitten or stung by nonvenomous insect and other nonvenomous arthropods, initial encounter: Secondary | ICD-10-CM | POA: Insufficient documentation

## 2016-06-09 DIAGNOSIS — I10 Essential (primary) hypertension: Secondary | ICD-10-CM | POA: Diagnosis not present

## 2016-06-09 DIAGNOSIS — S50862A Insect bite (nonvenomous) of left forearm, initial encounter: Secondary | ICD-10-CM | POA: Diagnosis not present

## 2016-06-09 DIAGNOSIS — Z79899 Other long term (current) drug therapy: Secondary | ICD-10-CM | POA: Diagnosis not present

## 2016-06-09 MED ORDER — SULFAMETHOXAZOLE-TRIMETHOPRIM 800-160 MG PO TABS
1.0000 | ORAL_TABLET | Freq: Once | ORAL | Status: AC
  Administered 2016-06-09: 1 via ORAL
  Filled 2016-06-09: qty 1

## 2016-06-09 MED ORDER — TRIAMCINOLONE ACETONIDE 0.1 % EX CREA: 1.0000 "application " | TOPICAL_CREAM | Freq: Three times a day (TID) | CUTANEOUS | 0 refills | Status: DC

## 2016-06-09 MED ORDER — SULFAMETHOXAZOLE-TRIMETHOPRIM 800-160 MG PO TABS: 1.0000 | ORAL_TABLET | Freq: Two times a day (BID) | ORAL | 0 refills | Status: AC

## 2016-06-09 MED ORDER — DIPHENHYDRAMINE HCL 25 MG PO CAPS
25.0000 mg | ORAL_CAPSULE | Freq: Once | ORAL | Status: AC
  Administered 2016-06-09: 25 mg via ORAL
  Filled 2016-06-09: qty 1

## 2016-06-09 NOTE — Discharge Instructions (Signed)
You can take one benadryl capsule every 6 hrs if needed for itching

## 2016-06-09 NOTE — ED Provider Notes (Signed)
[facility]EMER[facility]ENCY DEPT Provider Note   CSN: [id number] Arrival date & time: 06/09/16  2053     History   Chief Complaint Chief Complaint  Patient presents with  . Insect Bite    HPI Wendy Newman is a 75 y.o. female.  HPI  Wendy Newman is a 75 y.o. female who presents to the Emergency Department complaining of insect bite to her left forearm that occurred two days ago.  She describes a small red "bump"  That has now progressed to a larger, red area with tenderness and itching at the site.  She has been applying OTC hydrocortisone cream without relief.  She denies fever, blisters, swelling.    Past Medical History:  Diagnosis Date  . Anxiety   . Cataract   . Edema of both legs   . Essential hypertension   . Osteoporosis   . Vitamin D deficiency     Patient Active Problem List   Diagnosis Date Noted  . Family history of colonic polyps 11/28/2014  . Family history of colon cancer 11/28/2014  . Constipation 11/28/2014  . Vitamin D deficiency 06/16/2013  . Hypertension   . Anxiety   . Osteoporosis   . Cataract   . Hyperlipemia 05/18/2009  . Anxiety state 05/18/2009  . NECK PAIN 05/18/2009  . ALLERGIC RHINITIS 08/28/2008  . OSTEOARTHRITIS 08/28/2008  . POSTMENOPAUSAL OSTEOPOROSIS 08/28/2008  . PAT 04/24/2008  . OTHER ABNORMAL BLOOD CHEMISTRY 04/24/2008  . Essential hypertension 12/16/2007  . HEADACHE 12/16/2007    Past Surgical History:  Procedure Laterality Date  . ABDOMINAL HYSTERECTOMY     Complete  . CESAREAN SECTION     X 3  . COLONOSCOPY  10/10/2008   [id number]:incomplete  . COLONOSCOPY WITH PROPOFOL N/A 12/21/2014   Dr. Rourk:noncompliant left colon and redundant right colon, rectal and colonic mucosa appeared normal.     OB History    No data available       Home Medications    Prior to Admission medications   Medication Sig Start Date End Date Taking? Authorizing Provider  calcium carbonate (OS-CAL) 600 MG TABS Take 600 mg by mouth 2 (two)  times daily with a meal.    Historical Provider, MD  cholecalciferol (VITAMIN D) 1000 UNITS tablet Take 1,000 Units by mouth daily.     Historical Provider, MD  citalopram (CELEXA) 20 MG tablet Take 1 tablet by mouth  daily 01/23/16   Dorena BodoMary B Dixon, PA-C  fexofenadine (ALLEGRA) 180 MG tablet Take 180 mg by mouth daily as needed for allergies.     Historical Provider, MD  hydrochlorothiazide (HYDRODIURIL) 25 MG tablet Take 1 tablet by mouth  daily 01/23/16   Dorena BodoMary B Dixon, PA-C    Family History Family History  Problem Relation Age of Onset  . Cancer Mother 769    Breast. age 73.   . Colon polyps Mother   . Heart disease Father [phone]    CABG  . Cancer Sister 159    Lung  . Cancer Brother 268    Started in the back  . Colon cancer Other     Age 5150s. maternal grandmother    Social History Social History  Substance Use Topics  . Smoking status: Never Smoker  . Smokeless tobacco: Never Used  . Alcohol use No     Allergies   Review of patient's allergies indicates no known allergies.   Review of Systems Review of Systems  Constitutional: Negative for chills and fever.  Gastrointestinal: Negative  for nausea and vomiting.  Musculoskeletal: Negative for arthralgias and joint swelling.  Skin: Positive for color change.       Redness, itching and pain to the left mid forearm  Hematological: Negative for adenopathy.  All other systems reviewed and are negative.    Physical Exam Updated Vital Signs BP 136/60 (BP Location: Left Arm)   Pulse 71   Temp 98.2 F (36.8 C)   Resp 18   Ht 5\' 6"  (1.676 m)   Wt 80.7 kg   SpO2 98%   BMI 28.73 kg/m   Physical Exam  Constitutional: She is oriented to person, place, and time. She appears well-developed and well-nourished. No distress.  HENT:  Head: Normocephalic and atraumatic.  Cardiovascular: Normal rate, regular rhythm and normal heart sounds.   No murmur heard. Pulmonary/Chest: Effort normal and breath sounds normal. No respiratory  distress.  Musculoskeletal: Normal range of motion.  Neurological: She is alert and oriented to person, place, and time. She exhibits normal muscle tone. Coordination normal.  Skin: Skin is warm and dry. There is erythema.  4 cm circular area of erythema to mid left forearm with central papule.  No induration or fluctuance.   Psychiatric: She has a normal mood and affect.  Nursing note and vitals reviewed.    ED Treatments / Results  Labs (all labs ordered are listed, but only abnormal results are displayed) Labs Reviewed - No data to display  EKG  EKG Interpretation None       Radiology No results found.  Procedures Procedures (including critical care time)  Medications Ordered in ED Medications - No data to display   Initial Impression / Assessment and Plan / ED Course  I have reviewed the triage vital signs and the nursing notes.  Pertinent labs & imaging results that were available during my care of the patient were reviewed by me and considered in my medical decision making (see chart for details).  Clinical Course   Focal area of erythema to left forearm that appears c/w insect bite.  No abscess at present although appears cellulitic.  Pt agrees to abx and triamcinolone cream.  Return precautions given.   Leading edge of the erythema marked by me  Final Clinical Impressions(s) / ED Diagnoses   Final diagnoses:  Insect bite    New Prescriptions Discharge Medication List as of 06/09/2016 10:40 PM    START taking these medications   Details  sulfamethoxazole-trimethoprim (BACTRIM DS,SEPTRA DS) 800-160 MG tablet Take 1 tablet by mouth 2 (two) times daily., Starting Mon 06/09/2016, Until Mon 06/16/2016, Print    triamcinolone cream (KENALOG) 0.1 % Apply 1 application topically 3 (three) times daily., Starting Mon 06/09/2016, Print          [place], PA-C 06/10/16 1312    Maia PlanJoshua G Long, MD 06/10/16 [phone]

## 2016-06-09 NOTE — ED Triage Notes (Signed)
Pt c/o insect bite to left forearm since Saturday.

## 2016-06-11 ENCOUNTER — Ambulatory Visit (INDEPENDENT_AMBULATORY_CARE_PROVIDER_SITE_OTHER): Payer: Medicare Other | Admitting: Physician Assistant

## 2016-06-11 ENCOUNTER — Encounter: Payer: Self-pay | Admitting: Physician Assistant

## 2016-06-11 VITALS — BP 144/86 | HR 78 | Temp 98.6°F | Resp 16 | Ht 66.0 in | Wt 175.0 lb

## 2016-06-11 DIAGNOSIS — L200 Besnier's prurigo: Secondary | ICD-10-CM

## 2016-06-11 DIAGNOSIS — L239 Allergic contact dermatitis, unspecified cause: Secondary | ICD-10-CM

## 2016-06-11 MED ORDER — PREDNISONE 20 MG PO TABS: ORAL_TABLET | ORAL | 0 refills | Status: DC

## 2016-06-11 NOTE — Progress Notes (Signed)
Patient ID: Wendy BeadyBarbara S Canupp MRN: [id number], DOB: December 20, 1940, [age] y.o. Date of Encounter: 06/11/2016, 1:07 PM    Chief Complaint:  Chief Complaint  Patient presents with  . Rash    One left arm, Began monday - was seen in ER     HPI: 75 y.o. year old white female presents with above.   Reviewed ER note from 06/09/16. She had insect bite to the left forearm that occurred on Saturday 06/07/16. She reported to the ER that it started as a small red bump that had progressed to a larger red area with tenderness and itching at the site. Was applying over-the-counter hydrocortisone cream without relief. At the time of physical exam at the ER they reported 4 cm circular area of erythema to the mid left forearm with central papule. No induration or fluctuance. They prescribed Bactrim DS 1 by mouth twice a day and triamcinolone cream 0.1% to apply 3 times a day.  Reviewed the above information with patient today and she agrees that all of this is accurate. She states that she has been taking the Bactrim and applying the triamcinolone cream as directed.  She says that the ER told her to follow-up if the size of the site continued to increase. She says that the area of erythema has gotten larger since she came in today. That area is itchy. No other complaints or concerns. No fevers or chills.   Says that she does not know what bit her. Did not see any insect or be. Has had no necrosis of skin. No abdominal pain or vomiting.  Home Meds:   Outpatient Medications Prior to Visit  Medication Sig Dispense Refill  . cholecalciferol (VITAMIN D) 1000 UNITS tablet Take 1,000 Units by mouth daily.     . citalopram (CELEXA) 20 MG tablet Take 1 tablet by mouth  daily 90 tablet 1  . hydrochlorothiazide (HYDRODIURIL) 25 MG tablet Take 1 tablet by mouth  daily 90 tablet 1  . sulfamethoxazole-trimethoprim (BACTRIM DS,SEPTRA DS) 800-160 MG tablet Take 1 tablet by mouth 2 (two) times daily. 14 tablet 0  .  triamcinolone cream (KENALOG) 0.1 % Apply 1 application topically 3 (three) times daily. 15 g 0  . calcium carbonate (OS-CAL) 600 MG TABS Take 600 mg by mouth 2 (two) times daily with a meal.    . fexofenadine (ALLEGRA) 180 MG tablet Take 180 mg by mouth daily as needed for allergies.      No facility-administered medications prior to visit.     Allergies: No Known Allergies    Review of Systems: See HPI for pertinent ROS. All other ROS negative.    Physical Exam: Blood pressure (!) 144/86, pulse 78, temperature 98.6 F (37 C), temperature source Oral, resp. rate 16, height 5\' 6"  (1.676 m), weight 175 lb (79.4 kg), SpO2 99 %., Body mass index is 28.25 kg/m. General:  Appears in no acute distress. Neck: Supple. No thyromegaly. No lymphadenopathy. Lungs: Clear bilaterally to auscultation without wheezes, rales, or rhonchi. Breathing is unlabored. Heart: Regular rhythm. No murmurs, rubs, or gallops. Msk:  Strength and tone normal for age. Extremities/Skin: Left forearm--dorsal aspect--midway--- there is 5.5 cm x 4.5 cm circular area of diffuse pink erythema. No abscess. Neuro: Alert and oriented X 3. Moves all extremities spontaneously. Gait is normal. CNII-XII grossly in tact. Psych:  Responds to questions appropriately with a normal affect.     ASSESSMENT AND PLAN:  75 y.o. year old female with  1. Allergic dermatitis  She is to take the prednisone as directed below. She is also to take Benadryl as directed. Can continue the Bactrim and the triamcinolone cream prescribed from the ER. Follow-up if needed. - predniSONE (DELTASONE) 20 MG tablet; Take 3 daily for 2 days, then 2 daily for 2 days, then 1 daily for 2 days.  Dispense: 12 tablet; Refill: 0   Signed, [address] Beth WatkinsvilleDixon, [place]PA, [facility] 06/11/2016 1:07 PM

## 2016-06-14 ENCOUNTER — Encounter (HOSPITAL_COMMUNITY): Payer: Self-pay | Admitting: *Deleted

## 2016-06-14 ENCOUNTER — Emergency Department (HOSPITAL_COMMUNITY)
Admission: EM | Admit: 2016-06-14 | Discharge: 2016-06-14 | Disposition: A | Discharge status: Home / Self Care | Payer: Medicare Other | Attending: Emergency Medicine | Admitting: Emergency Medicine

## 2016-06-14 DIAGNOSIS — Y939 Activity, unspecified: Secondary | ICD-10-CM | POA: Diagnosis not present

## 2016-06-14 DIAGNOSIS — Y929 Unspecified place or not applicable: Secondary | ICD-10-CM | POA: Insufficient documentation

## 2016-06-14 DIAGNOSIS — Z79899 Other long term (current) drug therapy: Secondary | ICD-10-CM | POA: Insufficient documentation

## 2016-06-14 DIAGNOSIS — S50862A Insect bite (nonvenomous) of left forearm, initial encounter: Secondary | ICD-10-CM | POA: Diagnosis not present

## 2016-06-14 DIAGNOSIS — I10 Essential (primary) hypertension: Secondary | ICD-10-CM | POA: Diagnosis not present

## 2016-06-14 DIAGNOSIS — W57XXXA Bitten or stung by nonvenomous insect and other nonvenomous arthropods, initial encounter: Secondary | ICD-10-CM | POA: Insufficient documentation

## 2016-06-14 DIAGNOSIS — R21 Rash and other nonspecific skin eruption: Secondary | ICD-10-CM | POA: Diagnosis present

## 2016-06-14 DIAGNOSIS — S50862D Insect bite (nonvenomous) of left forearm, subsequent encounter: Secondary | ICD-10-CM | POA: Insufficient documentation

## 2016-06-14 DIAGNOSIS — L539 Erythematous condition, unspecified: Secondary | ICD-10-CM | POA: Diagnosis not present

## 2016-06-14 MED ORDER — DEXAMETHASONE 4 MG PO TABS: 4.0000 mg | ORAL_TABLET | Freq: Two times a day (BID) | ORAL | 0 refills | Status: DC

## 2016-06-14 MED ORDER — DEXAMETHASONE SODIUM PHOSPHATE 4 MG/ML IJ SOLN
8.0000 mg | Freq: Once | INTRAMUSCULAR | Status: AC
  Administered 2016-06-14: 8 mg via INTRAMUSCULAR
  Filled 2016-06-14: qty 2

## 2016-06-14 MED ORDER — HYDROXYZINE PAMOATE 25 MG PO CAPS: 25.0000 mg | ORAL_CAPSULE | Freq: Three times a day (TID) | ORAL | 0 refills | Status: DC | PRN

## 2016-06-14 MED ORDER — HYDROXYZINE HCL 25 MG PO TABS
25.0000 mg | ORAL_TABLET | Freq: Once | ORAL | Status: AC
  Administered 2016-06-14: 25 mg via ORAL
  Filled 2016-06-14: qty 1

## 2016-06-14 NOTE — ED Provider Notes (Signed)
[facility]Y DEPT Provider Note   CSN: [id number] Arrival date & time: 06/14/16  1148     History   Chief Complaint Chief Complaint  Patient presents with  . Rash    HPI Wendy Newman is a 75 y.o. female.  Patient is a 75 year old female who presents to the emergency department with a complaint of pain, burning, and itching of the left forearm.  The patient states that she sustained an insect bite to the left forearm. She came to the emergency department on August 15 for evaluation as it was turning red and causing him pain and discomfort. The patient was placed on antibiotics and Benadryl. The patient states that the area of redness seems to be getting larger, and the Benadryl is only partially helping with the burning and itching. She has not had fever, or chills, or red streaks going up the arm. She's not had any loss of control or function of the left upper extremity.    Rash      Past Medical History:  Diagnosis Date  . Anxiety   . Cataract   . Edema of both legs   . Essential hypertension   . Osteoporosis   . Vitamin D deficiency     Patient Active Problem List   Diagnosis Date Noted  . Family history of colonic polyps 11/28/2014  . Family history of colon cancer 11/28/2014  . Constipation 11/28/2014  . Vitamin D deficiency 06/16/2013  . Hypertension   . Anxiety   . Osteoporosis   . Cataract   . Hyperlipemia 05/18/2009  . Anxiety state 05/18/2009  . NECK PAIN 05/18/2009  . ALLERGIC RHINITIS 08/28/2008  . OSTEOARTHRITIS 08/28/2008  . POSTMENOPAUSAL OSTEOPOROSIS 08/28/2008  . PAT 04/24/2008  . OTHER ABNORMAL BLOOD CHEMISTRY 04/24/2008  . Essential hypertension 12/16/2007  . HEADACHE 12/16/2007    Past Surgical History:  Procedure Laterality Date  . ABDOMINAL HYSTERECTOMY     Complete  . CESAREAN SECTION     X 3  . COLONOSCOPY  10/10/2008   WUJ:[id number]  . COLONOSCOPY WITH PROPOFOL N/A 12/21/2014   Dr. Rourk:noncompliant left colon and  redundant right colon, rectal and colonic mucosa appeared normal.     OB History    No data available       Home Medications    Prior to Admission medications   Medication Sig Start Date End Date Taking? Authorizing Provider  cholecalciferol (VITAMIN D) 1000 UNITS tablet Take 1,000 Units by mouth daily.    Yes Historical Provider, MD  citalopram (CELEXA) 20 MG tablet Take 1 tablet by mouth  daily 01/23/16  Yes Dorena Bodo, PA-C  hydrochlorothiazide (HYDRODIURIL) 25 MG tablet Take 1 tablet by mouth  daily 01/23/16  Yes Dorena Bodo, PA-C  predniSONE (DELTASONE) 20 MG tablet Take 3 daily for 2 days, then 2 daily for 2 days, then 1 daily for 2 days. 06/11/16  Yes Mary B Dixon, PA-C  sulfamethoxazole-trimethoprim (BACTRIM DS,SEPTRA DS) 800-160 MG tablet Take 1 tablet by mouth 2 (two) times daily. 06/09/16 06/16/16 Yes Tammy Triplett, PA-C  triamcinolone cream (KENALOG) 0.1 % Apply 1 application topically 3 (three) times daily. 06/09/16  Yes Tammy Triplett, PA-C    Family History Family History  Problem Relation Age of Onset  . Cancer Mother 68    Breast. age 88.   . Colon polyps Mother   . Heart disease Father 45    CABG  . Cancer Sister 66    Lung  . Cancer Brother  68    Started in the back  . Colon cancer Other     Age 5050s. maternal grandmother    Social History Social History  Substance Use Topics  . Smoking status: Never Smoker  . Smokeless tobacco: Never Used  . Alcohol use No     Allergies   Review of patient's allergies indicates no known allergies.   Review of Systems Review of Systems  Musculoskeletal: Positive for arthralgias.  Skin: Positive for rash.  Psychiatric/Behavioral: The patient is nervous/anxious.   All other systems reviewed and are negative.    Physical Exam Updated Vital Signs BP 143/67 (BP Location: Right Arm)   Pulse 62   Temp 97.9 F (36.6 C) (Oral)   Resp 16   Ht 5\' 6"  (1.676 m)   Wt 79.4 kg   SpO2 100%   BMI 28.25 kg/m    Physical Exam  Constitutional: She is oriented to person, place, and time. She appears well-developed and well-nourished.  Non-toxic appearance.  HENT:  Head: Normocephalic.  Right Ear: Tympanic membrane and external ear normal.  Left Ear: Tympanic membrane and external ear normal.  The airway is patent. The uvula is in the midline.  Eyes: EOM and lids are normal. Pupils are equal, round, and reactive to light.  Neck: Normal range of motion. Neck supple. Carotid bruit is not present.  Cardiovascular: Normal rate, regular rhythm, normal heart sounds, intact distal pulses and normal pulses.   Pulmonary/Chest: Breath sounds normal. No respiratory distress.  Lungs are clear bilaterally. There is symmetrical rise and fall of the chest. The patient speaks in complete sentences without any problem whatsoever.  Abdominal: Soft. Bowel sounds are normal. There is no tenderness. There is no guarding.  Musculoskeletal:  There is an area of increased redness on the inner aspect of the left forearm. There is a scabbed area in the middle of the reddened area. There is slight increased warmth, but the area is not hot. There no red streaks appreciated. There is no drainage from the area. There is full range of motion of the left shoulder, elbow, wrist, and fingers. The radial pulses 2+, and the capillary refill is less than 2 seconds.  Lymphadenopathy:       Head (right side): No submandibular adenopathy present.       Head (left side): No submandibular adenopathy present.    She has no cervical adenopathy.  Neurological: She is alert and oriented to person, place, and time. She has normal strength. No cranial nerve deficit or sensory deficit.  Skin: Skin is warm and dry.  No hives appreciated. Please see the musculoskeletal area for description of the insect bite area.  Psychiatric: She has a normal mood and affect. Her speech is normal.  Nursing note and vitals reviewed.    ED Treatments / Results   Labs (all labs ordered are listed, but only abnormal results are displayed) Labs Reviewed - No data to display  EKG  EKG Interpretation None       Radiology No results found.  Procedures Procedures (including critical care time)  Medications Ordered in ED Medications  dexamethasone (DECADRON) injection 8 mg (8 mg Intramuscular Given 06/14/16 1342)  hydrOXYzine (ATARAX/VISTARIL) tablet 25 mg (25 mg Oral Given 06/14/16 1342)     Initial Impression / Assessment and Plan / ED Course  I have reviewed the triage vital signs and the nursing notes.  Pertinent labs & imaging results that were available during my care of the patient were reviewed  by me and considered in my medical decision making (see chart for details).  Clinical Course    *I have reviewed nursing notes, vital signs, and all appropriate lab and imaging results for this patient.**  Final Clinical Impressions(s) / ED Diagnoses  The vital signs within normal limits. The pulse oximetry is 100% on room air. Within normal limits by my interpretation. The patient states that the area of redness at the insect bite site has gotten larger. The area has been marked with a skin pencil on. The area is warm, but not hot to touch. There no hot joints on the left upper extremity. I've advised the patient to finish her antibiotics. We will stop the Benadryl and she states this is not helping but a short period of time. We will use Vistaril every 6 hours for the itching. I have one the patient that this medication may cause drowsiness, and to use caution getting around when taking this medication. The patient was treated with intramuscular Decadron here in the emergency department, and she will placed on a 5 day course of Decadron at home. The patient will return to the emergency department or see the primary physician if any changes, problems, or concerns.    Final diagnoses:  None    New Prescriptions New Prescriptions   No  medications on file     Ivery QualeHobson , PA-C 06/14/16 1400    Samuel JesterKathleen McManus, DO 06/17/16 1515

## 2016-06-14 NOTE — ED Notes (Signed)
Skin marker used to trace outline of redness.

## 2016-06-14 NOTE — ED Notes (Signed)
Pt made aware to return if symptoms worsen or if any life threatening symptoms occur.  Pt is taking predisone at home, Link SnufferHobson wants pt to continue taking prednisone, cancelled prescription of decadron.  Pt verbalizes understanding of d/c instructions.

## 2016-06-14 NOTE — ED Triage Notes (Signed)
Pt was seen here Monday for a rash on her left arm. She has been taking antibiotics and benadryl as prescribed. Pt states this areas has gotten larger and the itching has increased. Denies fevers, n/v/d.

## 2016-06-14 NOTE — ED Notes (Signed)
Hobson at bedside. 

## 2016-06-14 NOTE — Discharge Instructions (Signed)
Please finish your antibiotic. Stopped your Benadryl for now. Use Vistaril every 6 hours as needed for itching and burning. This medication will cause drowsiness, please use caution getting around. Please do not drive, operate machinery, or participate in activities requiring concentration when taking the Vistaril. Please use Decadron 2 times daily with food. Please see Dr. Durwin Noraixon, or return to the emergency department for additional evaluation if not improving.

## 2016-07-02 ENCOUNTER — Encounter: Payer: Self-pay | Admitting: Physician Assistant

## 2016-07-02 ENCOUNTER — Ambulatory Visit (INDEPENDENT_AMBULATORY_CARE_PROVIDER_SITE_OTHER): Payer: Medicare Other | Admitting: Physician Assistant

## 2016-07-02 VITALS — BP 130/69 | HR 57 | Temp 97.9°F | Resp 18 | Wt 175.0 lb

## 2016-07-02 DIAGNOSIS — E785 Hyperlipidemia, unspecified: Secondary | ICD-10-CM

## 2016-07-02 DIAGNOSIS — E559 Vitamin D deficiency, unspecified: Secondary | ICD-10-CM | POA: Diagnosis not present

## 2016-07-02 DIAGNOSIS — F419 Anxiety disorder, unspecified: Secondary | ICD-10-CM | POA: Diagnosis not present

## 2016-07-02 DIAGNOSIS — I10 Essential (primary) hypertension: Secondary | ICD-10-CM | POA: Diagnosis not present

## 2016-07-02 DIAGNOSIS — M810 Age-related osteoporosis without current pathological fracture: Secondary | ICD-10-CM

## 2016-07-02 DIAGNOSIS — Z800 Family history of malignant neoplasm of digestive organs: Secondary | ICD-10-CM | POA: Diagnosis not present

## 2016-07-02 LAB — COMPLETE METABOLIC PANEL WITH GFR
ALBUMIN: 4.2 g/dL (ref 3.6–5.1)
ALK PHOS: 49 U/L (ref 33–130)
ALT: 13 U/L (ref 6–29)
AST: 17 U/L (ref 10–35)
BILIRUBIN TOTAL: 0.6 mg/dL (ref 0.2–1.2)
BUN: 16 mg/dL (ref 7–25)
CO2: 29 mmol/L (ref 20–31)
Calcium: 9.5 mg/dL (ref 8.6–10.4)
Chloride: 101 mmol/L (ref 98–110)
Creat: 0.94 mg/dL — ABNORMAL HIGH (ref 0.60–0.93)
GFR, EST NON AFRICAN AMERICAN: 61 mL/min (ref >=60)
GFR, Est African American: 70 mL/min (ref >=60)
Glucose, Bld: 99 mg/dL (ref 70–99)
Potassium: 4.2 mmol/L (ref 3.5–5.3)
SODIUM: 138 mmol/L (ref 135–146)
TOTAL PROTEIN: 7 g/dL (ref 6.1–8.1)

## 2016-07-02 NOTE — Progress Notes (Signed)
Patient ID: Wendy Newman MRN: [id number], DOB: 06-Sep-1941, 75 y.o. Date of Encounter: @DATE @  Chief Complaint:  Chief Complaint  Patient presents with  . Hypertension    HPI: 75 y.o. year old female  presents routine followup office visit.  At her  visit in 05/2013 we discussed stopping Fosamax that she had been on it for 5 years. At f/u OV she stated that she did stop that medicine. She was taking her calcium and vitamin D. At OV-05/2014-she says she is no longer taking the Vitamin D secondary to finances. Back on Vitamin D and Calcium now--Entered 12/25/2014 OV  Financial distress: I also see her husband as a patient. I had noticed that he had lost a lot of weight. I further evaluated his weight loss. Found out that they were having severe financial distress. He was eating what he could. At followup he has told me that they are now using 3 different food pantries as well as food from the church. She does not make any further comments or provide any further information regarding the financial distress today except for the fact that she cannot afford to buy over-the-counter vitamin D.  She reports that the Celexa is still working well for her and is controlling her anxiety. Is having no adverse effects. Says that she has "always been a very nervous person" but the Celexa controls this well. Does mentioned today that her 73 year old grandson is living with her and her husband now. says he was wanting to drop out of school and is living with them currently. I discussed that the Celexa does come in a higher dose but she says that this is dose works well for her and she does not need to increase the dose. Says that she mostly needs this "for her nerves" but says that this is controlled at this dose.  She has no complaints. Even with exertion she still has no chest pressure tightness or heaviness in no increased shortness of breath or dyspnea.  In past, prescribed Linzess for  constipation---then she had diarrhea.  Then prescribed Amitiza.  At f/u---she says she is having normal BMs with no medication at all now--so not having to use any med for this.    Past Medical History:  Diagnosis Date  . Anxiety   . Cataract   . Edema of both legs   . Essential hypertension   . Osteoporosis   . Vitamin D deficiency      Home Meds:  Outpatient Medications Prior to Visit  Medication Sig Dispense Refill  . cholecalciferol (VITAMIN D) 1000 UNITS tablet Take 1,000 Units by mouth daily.     . citalopram (CELEXA) 20 MG tablet Take 1 tablet by mouth  daily 90 tablet 1  . hydrochlorothiazide (HYDRODIURIL) 25 MG tablet Take 1 tablet by mouth  daily 90 tablet 1  . dexamethasone (DECADRON) 4 MG tablet Take 1 tablet (4 mg total) by mouth 2 (two) times daily with a meal. (Patient not taking: Reported on 07/02/2016) 10 tablet 0  . hydrOXYzine (VISTARIL) 25 MG capsule Take 1 capsule (25 mg total) by mouth 3 (three) times daily as needed. (Patient not taking: Reported on 07/02/2016) 20 capsule 0  . predniSONE (DELTASONE) 20 MG tablet Take 3 daily for 2 days, then 2 daily for 2 days, then 1 daily for 2 days. (Patient not taking: Reported on 07/02/2016) 12 tablet 0  . triamcinolone cream (KENALOG) 0.1 % Apply 1 application topically 3 (three) times daily. (Patient  not taking: Reported on 07/02/2016) 15 g 0   No facility-administered medications prior to visit.      Allergies: No Known Allergies  Social History   Social History  . Marital status: Married    Spouse name: N/A  . Number of children: 3  . Years of education: N/A   Occupational History  . 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 Smoking status: Never Smoker  . Smokeless tobacco: Never Used  . Alcohol use No  . Drug use: No  . Sexual activity: Yes    Birth control/ protection: Surgical   Other Topics Concern  . Not on file   Social History Narrative  . No narrative on file    Family  History  Problem Relation Age of Onset  . Cancer Mother 67    Breast. age 68.   . Colon polyps Mother   . Heart disease Father 45    CABG  . Cancer Sister 45    Lung  . Cancer Brother 84    Started in the back  . Colon cancer Other     Age 74s. maternal grandmother     Review of Systems:  See HPI for pertinent ROS. All other ROS negative.    Physical Exam: Blood pressure 130/69, pulse (!) 57, temperature 97.9 F (36.6 C), temperature source Oral, resp. rate 18, weight 175 lb (79.4 kg)., Body mass index is 28.25 kg/m. General: WNWD WF. Appears in no acute distress. HEENT: Eyes: Normal Exam. Ears: Normal Canals and TMs bilaterally. Oral Mucosa Normal. Poor dentition lower mouth. Has dentures upper.  Neck: Supple. No thyromegaly. No lymphadenopathy. No carotid bruit.  Lungs: Clear bilaterally to auscultation without wheezes, rales, or rhonchi. Breathing is unlabored. Heart: RRR with S1 S2. No murmurs, rubs, or gallops. Breast: Normal bilaterally. No masses. No skin changes. No nipple discharge. Abdomen: Soft, non-tender, non-distended with normoactive bowel sounds. No hepatomegaly. No rebound/guarding. No obvious abdominal masses. Musculoskeletal:  Strength and tone normal for age.Full ROM of all joints.  Severe tenderness at back of heel, where achilles tendon inserts.  Extremities/Skin: Warm and dry.  No edema.  No rash or suspicious lesions.  Neuro: Alert and oriented X 3. Moves all extremities spontaneously. Gait is normal. CNII-XII grossly in tact. Psych:  Responds to questions appropriately with a normal affect.     ASSESSMENT AND PLAN:  75 y.o. year old female with    1. HYPERTENSION Blood pressure is well controlled. Continue current medication. Check lab to monitor  2. ANXIETY Anxiety is controlled with current dose of Celexa. Continue current medication.  3. POSTMENOPAUSAL OSTEOPOROSIS At her visit 06/16/2013 we discussed that Fosamax should be stopped after  being on that therapy for 5 years. She has started Fosamax November 2009. Therefore was do to stop medication November 2014. She did stop the medication at that time. At OV 05/2014--She was still taking calcium but not currently taking  vitamin D sec to finances.  At office visit 11/2014 she states that she is back on the vitamin D and also still taking calcium   Screening Labs:  She had screening labs at CPE 06/25/2015--all normal.  Visit for preventive health examination - CBC with Differential/Platelet - COMPLETE METABOLIC PANEL WITH GFR - Lipid panel - TSH - Vit D  25 hydroxy (rtn osteoporosis monitoring)    Vitamin D deficiency Was on Vit D in past then at OV 2015--- she said that she was no longer  taking this because of financial distress At office visit 11/2014 she reports that she is back on the vitamin D At OV 05/2015---says still taking Vitamin D   Lipids--she had fasting lipid profile 05/2010 and Lipid Panel was Favorable at that time. 05/2015---She is fasting so will recheck now.   Mammogram: Patient states that she has had this annually. She says that she has her mammogram at [facility] every November. Last mammogram 09/28/2015 negative  Colonoscopy: Patient reports she had this  by Dr. Kendell Bane --  November 29, 2014  Pelvic Exam/ Pap Smear---Pt defers Pelvic Exam. Age 23.   Immunizations: Tetanus: Patient checked regarding cost and he was insurance the cost would be $40 so she has deferred.--11/2014--I gave this to Selena Batten to put into Epic--so will not cont to show up on Report Pneumonia vaccine: Pneumovax given 06/16/2012.                                  Prevnar 13 given here 06/21/2014 Zostavax: She has checked with her insurance regarding her cost and it is too expensive so she has deferred.--11/2014--I gave this to Selena Batten to put into Epic--so will not cont to show up on Report   Routine office visit 6 months or sooner if needed.  Signed, [address] [place], [place], [facility] 07/02/2016 9:43  AM

## 2016-07-03 ENCOUNTER — Encounter: Payer: Self-pay | Admitting: Family Medicine

## 2016-08-21 ENCOUNTER — Ambulatory Visit (INDEPENDENT_AMBULATORY_CARE_PROVIDER_SITE_OTHER): Payer: Medicare Other | Admitting: Physician Assistant

## 2016-08-21 ENCOUNTER — Encounter: Payer: Self-pay | Admitting: Physician Assistant

## 2016-08-21 VITALS — BP 124/80 | HR 61 | Temp 98.8°F | Resp 16 | Wt 181.0 lb

## 2016-08-21 DIAGNOSIS — M25562 Pain in left knee: Secondary | ICD-10-CM | POA: Diagnosis not present

## 2016-08-21 NOTE — Progress Notes (Signed)
Patient ID: Wendy Newman MRN: [id number], DOB: 1941/07/08, 75 y.o. Date of Encounter: 08/21/2016, 1:04 PM    Chief Complaint:  Chief Complaint  Patient presents with  . Knee Pain    left knee swelling     HPI: 75 y.o. year old female presents with above.   Patient reports that she has no prior history of any problems with her left knee. Says that this started 1-1/2 weeks ago but is getting worse. Points to the medial joint line of the left knee and says that that area has been painful and "puffy"  Says that it has not been locking up but does feel like it's going to give way.  There was no acute onset of symptoms. She is unaware of any injury or trauma that would've caused this.  Says that there is no ache-- that it "hurts "--sometimes it "brings tears to her eyes" and says that at those times she be given a 10 over 10. She feels the pain when she goes from sitting to standing and when she is going down steps.     Home Meds:   Outpatient Medications Prior to Visit  Medication Sig Dispense Refill  . cholecalciferol (VITAMIN D) 1000 UNITS tablet Take 1,000 Units by mouth daily.     . citalopram (CELEXA) 20 MG tablet Take 1 tablet by mouth  daily 90 tablet 1  . dexamethasone (DECADRON) 4 MG tablet Take 1 tablet (4 mg total) by mouth 2 (two) times daily with a meal. 10 tablet 0  . hydrochlorothiazide (HYDRODIURIL) 25 MG tablet Take 1 tablet by mouth  daily 90 tablet 1  . hydrOXYzine (VISTARIL) 25 MG capsule Take 1 capsule (25 mg total) by mouth 3 (three) times daily as needed. (Patient not taking: Reported on 08/21/2016) 20 capsule 0  . predniSONE (DELTASONE) 20 MG tablet Take 3 daily for 2 days, then 2 daily for 2 days, then 1 daily for 2 days. (Patient not taking: Reported on 08/21/2016) 12 tablet 0  . triamcinolone cream (KENALOG) 0.1 % Apply 1 application topically 3 (three) times daily. (Patient not taking: Reported on 08/21/2016) 15 g 0   No facility-administered  medications prior to visit.     Allergies: No Known Allergies    Review of Systems: See HPI for pertinent ROS. All other ROS negative.    Physical Exam: Blood pressure 124/80, pulse 61, temperature 98.8 F (37.1 C), temperature source Oral, resp. rate 16, weight 181 lb (82.1 kg), SpO2 97 %., Body mass index is 29.21 kg/m. General:  WNWD WF. Appears in no acute distress. Neck: Supple. No thyromegaly. No lymphadenopathy. Lungs: Clear bilaterally to auscultation without wheezes, rales, or rhonchi. Breathing is unlabored. Heart: Regular rhythm. No murmurs, rubs, or gallops. Msk:  Strength and tone normal for age. Left Knee: Inspection is normal. There is no ecchymosis. Palpation-- there is severe tenderness with palpation at the medial joint line. Grind test is positive. There is no laxity without as or varus maneuvers. Carrier 4 is negative. Extremities/Skin: Warm and dry.  Neuro: Alert and oriented X 3. Moves all extremities spontaneously. Gait is normal. CNII-XII grossly in tact. Psych:  Responds to questions appropriately with a normal affect.     ASSESSMENT AND PLAN:  75 y.o. year old female with  1. Acute pain of left knee Suspect miniscus tear. She states that she has never seen any orthopedic herself. She has no preference of which orthopedic she sees. I have placed order for referral  to orthopedics and marked it urgent. Will get her into see them as soon as possible. Asked if she needs pain medications. She is okay with continuing over-the-counter medicines and avoiding movements that cause increased pain as much as possible. - Ambulatory referral to Orthopedic Surgery   Signed, Shon HaleMary Beth WestvilleDixon, [place], [facility] 08/21/2016 1:04 PM

## 2016-08-25 DIAGNOSIS — M25562 Pain in left knee: Secondary | ICD-10-CM | POA: Diagnosis not present

## 2016-08-25 DIAGNOSIS — M25462 Effusion, left knee: Secondary | ICD-10-CM | POA: Diagnosis not present

## 2016-09-02 ENCOUNTER — Other Ambulatory Visit: Payer: Self-pay | Admitting: Physician Assistant

## 2016-09-02 DIAGNOSIS — Z1231 Encounter for screening mammogram for malignant neoplasm of breast: Secondary | ICD-10-CM

## 2016-09-08 DIAGNOSIS — M25562 Pain in left knee: Secondary | ICD-10-CM | POA: Diagnosis not present

## 2016-09-17 DIAGNOSIS — M25562 Pain in left knee: Secondary | ICD-10-CM | POA: Diagnosis not present

## 2016-09-22 ENCOUNTER — Ambulatory Visit (INDEPENDENT_AMBULATORY_CARE_PROVIDER_SITE_OTHER): Payer: Medicare Other | Admitting: Family Medicine

## 2016-09-22 DIAGNOSIS — M25562 Pain in left knee: Secondary | ICD-10-CM | POA: Diagnosis not present

## 2016-09-22 DIAGNOSIS — Z23 Encounter for immunization: Secondary | ICD-10-CM

## 2016-09-26 ENCOUNTER — Other Ambulatory Visit: Payer: Self-pay | Admitting: Physician Assistant

## 2016-09-29 ENCOUNTER — Ambulatory Visit (HOSPITAL_COMMUNITY)
Admission: RE | Admit: 2016-09-29 | Discharge: 2016-09-29 | Disposition: A | Discharge status: Home / Self Care | Payer: Medicare Other | Source: Ambulatory Visit | Attending: Physician Assistant | Admitting: Physician Assistant

## 2016-09-29 DIAGNOSIS — Z1231 Encounter for screening mammogram for malignant neoplasm of breast: Secondary | ICD-10-CM | POA: Insufficient documentation

## 2016-09-29 DIAGNOSIS — N6489 Other specified disorders of breast: Secondary | ICD-10-CM | POA: Diagnosis not present

## 2016-09-30 NOTE — Telephone Encounter (Signed)
Rx filled per protocol  

## 2016-10-24 ENCOUNTER — Other Ambulatory Visit: Payer: Self-pay

## 2016-12-10 ENCOUNTER — Other Ambulatory Visit: Payer: Self-pay | Admitting: Physician Assistant

## 2016-12-10 NOTE — Telephone Encounter (Signed)
Rx filled per protocol  

## 2016-12-31 ENCOUNTER — Encounter: Payer: Self-pay | Admitting: Physician Assistant

## 2016-12-31 ENCOUNTER — Ambulatory Visit (INDEPENDENT_AMBULATORY_CARE_PROVIDER_SITE_OTHER): Payer: Medicare Other | Admitting: Physician Assistant

## 2016-12-31 VITALS — BP 124/82 | HR 56 | Temp 97.1°F | Resp 16 | Wt 184.8 lb

## 2016-12-31 DIAGNOSIS — I10 Essential (primary) hypertension: Secondary | ICD-10-CM

## 2016-12-31 DIAGNOSIS — M810 Age-related osteoporosis without current pathological fracture: Secondary | ICD-10-CM

## 2016-12-31 DIAGNOSIS — Z800 Family history of malignant neoplasm of digestive organs: Secondary | ICD-10-CM | POA: Diagnosis not present

## 2016-12-31 DIAGNOSIS — F419 Anxiety disorder, unspecified: Secondary | ICD-10-CM

## 2016-12-31 DIAGNOSIS — E559 Vitamin D deficiency, unspecified: Secondary | ICD-10-CM

## 2016-12-31 LAB — BASIC METABOLIC PANEL WITH GFR
BUN: 19 mg/dL (ref 7–25)
CO2: 25 mmol/L (ref 20–31)
Calcium: 9.4 mg/dL (ref 8.6–10.4)
Chloride: 103 mmol/L (ref 98–110)
Creat: 1.07 mg/dL — ABNORMAL HIGH (ref 0.60–0.93)
GFR, Est African American: 60 mL/min (ref >=60)
GFR, Est Non African American: 52 mL/min — ABNORMAL LOW (ref >=60)
GLUCOSE: 95 mg/dL (ref 70–99)
Potassium: 3.9 mmol/L (ref 3.5–5.3)
Sodium: 140 mmol/L (ref 135–146)

## 2016-12-31 NOTE — Progress Notes (Signed)
Patient ID: Wendy Newman MRN: [id number], DOB: 30-May-1941, 76 y.o. Date of Encounter: @DATE @  Chief Complaint:  Chief Complaint  Patient presents with  . Hypertension    6 month f/u  . Hyperlipidemia    HPI: 76 y.o. year old female  presents routine followup office visit.  At her  visit in 05/2013 we discussed stopping Fosamax that she had been on it for 5 years. At f/u OV she stated that she did stop that medicine. She was taking her calcium and vitamin D. At OV-05/2014-she says she is no longer taking the Vitamin D secondary to finances. Back on Vitamin D and Calcium now-- since 12/25/2014 OV  Financial distress: I also see her husband as a patient. I had noticed that he had lost a lot of weight. I further evaluated his weight loss. Found out that they were having severe financial distress. He was eating what he could. At followup he has told me that they are now using 3 different food pantries as well as food from the church. She does not make any further comments or provide any further information regarding the financial distress today except for the fact that she cannot afford to buy over-the-counter vitamin D.  She reports that the Celexa is still working well for her and is controlling her anxiety. Is having no adverse effects. Says that she has "always been a very nervous person" but the Celexa controls this well. At prior OV she mentioned that her 42 year old grandson is living with her and her husband now. says he was wanting to drop out of school and is living with them currently. I discussed that the Celexa does come in a higher dose but she says that this is dose works well for her and she does not need to increase the dose. Says that she mostly needs this "for her nerves" but says that this is controlled at this dose.  She is taking BP med as directed. No lightheadedness or other adverse effects.  She has no complaints. Even with exertion she still has no chest  pressure tightness or heaviness in no increased shortness of breath or dyspnea.  In past, prescribed Linzess for constipation---then she had diarrhea.  Then prescribed Amitiza.  At f/u---she says she is having normal BMs with no medication at all now--so not having to use any med for this.    Past Medical History:  Diagnosis Date  . Anxiety   . Cataract   . Edema of both legs   . Essential hypertension   . Osteoporosis   . Vitamin D deficiency      Home Meds:  Outpatient Medications Prior to Visit  Medication Sig Dispense Refill  . cholecalciferol (VITAMIN D) 1000 UNITS tablet Take 1,000 Units by mouth daily.     . citalopram (CELEXA) 20 MG tablet TAKE 1 TABLET BY MOUTH  DAILY 30 tablet 0  . dexamethasone (DECADRON) 4 MG tablet Take 1 tablet (4 mg total) by mouth 2 (two) times daily with a meal. 10 tablet 0  . hydrochlorothiazide (HYDRODIURIL) 25 MG tablet TAKE 1 TABLET BY MOUTH  DAILY 30 tablet 0  . hydrOXYzine (VISTARIL) 25 MG capsule Take 1 capsule (25 mg total) by mouth 3 (three) times daily as needed. (Patient not taking: Reported on 08/21/2016) 20 capsule 0  . predniSONE (DELTASONE) 20 MG tablet Take 3 daily for 2 days, then 2 daily for 2 days, then 1 daily for 2 days. (Patient not taking: Reported on  08/21/2016) 12 tablet 0  . triamcinolone cream (KENALOG) 0.1 % Apply 1 application topically 3 (three) times daily. (Patient not taking: Reported on 08/21/2016) 15 g 0   No facility-administered medications prior to visit.      Allergies: No Known Allergies  Social History   Social History  . Marital status: Married    Spouse name: N/A  . Number of children: 3  . Years of education: N/A   Occupational History  . 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 Smoking status: Never Smoker  . Smokeless tobacco: Never Used  . Alcohol use No  . Drug use: No  . Sexual activity: Yes    Birth control/ protection: Surgical   Other Topics Concern  . Not  on file   Social History Narrative  . No narrative on file    Family History  Problem Relation Age of Onset  . Cancer Mother 269    Breast. age 280.   . Colon polyps Mother   . Heart disease Father [id number]    CABG  . Cancer Sister [id number]    Lung  . Cancer Brother 268    Started in the back  . Colon cancer Other     Age 4950s. maternal grandmother     Review of Systems:  See HPI for pertinent ROS. All other ROS negative.    Physical Exam: Blood pressure 124/82, pulse (!) 56, temperature 97.1 F (36.2 C), temperature source Oral, resp. rate 16, weight 184 lb 12.8 oz (83.8 kg), SpO2 97 %., Body mass index is 29.83 kg/m. General: WNWD WF. Appears in no acute distress. Neck: Supple. No thyromegaly. No lymphadenopathy. No carotid bruit.  Lungs: Clear bilaterally to auscultation without wheezes, rales, or rhonchi. Breathing is unlabored. Heart: RRR with S1 S2. No murmurs, rubs, or gallops. Abdomen: Soft, non-tender, non-distended with normoactive bowel sounds. No hepatomegaly. No rebound/guarding. No obvious abdominal masses. Musculoskeletal:  Strength and tone normal for age.Full ROM of all joints.  Extremities/Skin: Warm and dry.  No edema.  No rash or suspicious lesions.  Neuro: Alert and oriented X 3. Moves all extremities spontaneously. Gait is normal. CNII-XII grossly in tact. Psych:  Responds to questions appropriately with a normal affect.     ASSESSMENT AND PLAN:  76 y.o. year old female with    1. HYPERTENSION Blood pressure is well controlled. Continue current medication. Check lab to monitor  2. ANXIETY Anxiety is controlled with current dose of Celexa. Continue current medication.  3. POSTMENOPAUSAL OSTEOPOROSIS At her visit 06/16/2013 we discussed that Fosamax should be stopped after being on that therapy for 5 years. She has started Fosamax November 2009. Therefore was do to stop medication November 2014. She did stop the medication at that time. At OV 05/2014--She was  still taking calcium but not currently taking  vitamin D sec to finances.  At office visit 11/2014 she states that she is back on the vitamin D and also still taking calcium 12/31/2016: she is taking calcium and vitamin D  Screening Labs:  She had screening labs at [facility] 06/25/2015--all normal.  Visit for preventive health examination - CBC with Differential/Platelet - COMPLETE METABOLIC PANEL WITH GFR - Lipid panel - TSH - Vit D  25 hydroxy (rtn osteoporosis monitoring)    Vitamin D deficiency Was on Vit D in past then at OV 2015--- she said that she was no longer taking this because of financial distress At office visit 11/2014 she  reports that she is back on the vitamin D At OV 05/2015---says still taking Vitamin D   Lipids--she had fasting lipid profile 05/2010 and Lipid Panel was Favorable at that time. 05/2015---She was fasting so rechecked FLP at that OV--LDL stable   Mammogram: Patient states that she has had this annually. She says that she has her mammogram at [facility] every November. Last mammogram 09/29/2016--- negative  Colonoscopy: Patient reports she had this  by Dr. Kendell Bane --  November 29, 2014  Pelvic Exam/ Pap Smear---Pt defers Pelvic Exam. Age 21.   Immunizations: Tetanus: Patient checked regarding cost and he was insurance the cost would be $40 so she has deferred.--11/2014--I gave this to Selena Batten to put into Epic--so will not cont to show up on Report Pneumonia vaccine: Pneumovax given 06/16/2012.                                  Prevnar 13 given here 06/21/2014 Zostavax: She has checked with her insurance regarding her cost and it is too expensive so she has deferred.--11/2014--I gave this to Selena Batten to put into Epic--so will not cont to show up on Report   Routine office visit 6 months or sooner if needed.  Signed, [address] [place], [place], [facility] 12/31/2016 9:08 AM

## 2017-03-13 ENCOUNTER — Other Ambulatory Visit: Payer: Self-pay

## 2017-03-13 MED ORDER — CITALOPRAM HYDROBROMIDE 20 MG PO TABS: 20.0000 mg | ORAL_TABLET | Freq: Every day | ORAL | 0 refills | Status: DC

## 2017-03-13 MED ORDER — HYDROCHLOROTHIAZIDE 25 MG PO TABS: 25.0000 mg | ORAL_TABLET | Freq: Every day | ORAL | 0 refills | Status: DC

## 2017-03-13 NOTE — Telephone Encounter (Signed)
Refill appropriate 

## 2017-05-01 ENCOUNTER — Other Ambulatory Visit: Payer: Self-pay | Admitting: Physician Assistant

## 2017-05-01 NOTE — Telephone Encounter (Signed)
Refill appropriate 

## 2017-07-02 ENCOUNTER — Encounter: Payer: Self-pay | Admitting: Physician Assistant

## 2017-07-02 ENCOUNTER — Ambulatory Visit (INDEPENDENT_AMBULATORY_CARE_PROVIDER_SITE_OTHER): Payer: Medicare Other | Admitting: Physician Assistant

## 2017-07-02 VITALS — BP 126/80 | HR 54 | Temp 98.0°F | Resp 18 | Ht 66.0 in | Wt 179.0 lb

## 2017-07-02 DIAGNOSIS — J309 Allergic rhinitis, unspecified: Secondary | ICD-10-CM | POA: Diagnosis not present

## 2017-07-02 DIAGNOSIS — I10 Essential (primary) hypertension: Secondary | ICD-10-CM | POA: Diagnosis not present

## 2017-07-02 DIAGNOSIS — H1010 Acute atopic conjunctivitis, unspecified eye: Secondary | ICD-10-CM

## 2017-07-02 DIAGNOSIS — M810 Age-related osteoporosis without current pathological fracture: Secondary | ICD-10-CM | POA: Diagnosis not present

## 2017-07-02 DIAGNOSIS — Z23 Encounter for immunization: Secondary | ICD-10-CM | POA: Diagnosis not present

## 2017-07-02 LAB — BASIC METABOLIC PANEL WITH GFR
BUN/Creatinine Ratio: 22 (calc) (ref 6–22)
BUN: 22 mg/dL (ref 7–25)
CALCIUM: 9.4 mg/dL (ref 8.6–10.4)
CO2: 29 mmol/L (ref 20–32)
Chloride: 101 mmol/L (ref 98–110)
Creat: 1.01 mg/dL — ABNORMAL HIGH (ref 0.60–0.93)
GFR, EST NON AFRICAN AMERICAN: 55 mL/min/{1.73_m2} — AB (ref >=60)
GFR, Est African American: 64 mL/min/{1.73_m2} (ref >=60)
GLUCOSE: 99 mg/dL (ref 65–99)
POTASSIUM: 3.9 mmol/L (ref 3.5–5.3)
Sodium: 139 mmol/L (ref 135–146)

## 2017-07-02 MED ORDER — CETIRIZINE HCL 10 MG PO TABS: 10.0000 mg | ORAL_TABLET | Freq: Every day | ORAL | 11 refills | Status: DC

## 2017-07-02 NOTE — Progress Notes (Signed)
Patient ID: Wendy Newman MRN: [id number], DOB: 04-Jun-1941, 76 y.o. Date of Encounter: @DATE @  Chief Complaint:  Chief Complaint  Patient presents with  . Medication Management    Pt fasting  . Medication Refill  . Flu Vaccine    HPI: 76 y.o. year old female  presents routine followup office visit.  At her  visit in 05/2013 we discussed stopping Fosamax that she had been on it for 5 years. At f/u OV she stated that she did stop that medicine. She was taking her calcium and vitamin D. At OV-05/2014-she says she is no longer taking the Vitamin D secondary to finances. Back on Vitamin D and Calcium now-- since 12/25/2014 OV  Financial distress: I also see her husband as a patient. I had noticed that he had lost a lot of weight. I further evaluated his weight loss. Found out that they were having severe financial distress. He was eating what he could. At followup he has told me that they are now using 3 different food pantries as well as food from the church. She does not make any further comments or provide any further information regarding the financial distress today except for the fact that she cannot afford to buy over-the-counter vitamin D.  She reports that the Celexa is still working well for her and is controlling her anxiety. Is having no adverse effects. Says that she has "always been a very nervous person" but the Celexa controls this well. At prior OV she mentioned that her 74 year old grandson is living with her and her husband now. says he was wanting to drop out of school and is living with them currently. I discussed that the Celexa does come in a higher dose but she says that this is dose works well for her and she does not need to increase the dose. Says that she mostly needs this "for her nerves" but says that this is controlled at this dose.  She is taking BP med as directed. No lightheadedness or other adverse effects.  In past, prescribed Linzess for  constipation---then she had diarrhea.  Then prescribed Amitiza.  At f/u---she says she is having normal BMs with no medication at all now--so not having to use any med for this.   Her only specific concern that she wanted to discuss today is that she says that her eyes have been watery and her nose has been watery recently. Thinks that it is her allergies. Visit just watery clear drainage drips from her nose. Is having no other additional symptoms. No chest congestion or cough. No fevers or chills.    Past Medical History:  Diagnosis Date  . Anxiety   . Cataract   . Edema of both legs   . Essential hypertension   . Osteoporosis   . Vitamin D deficiency      Home Meds:  Outpatient Medications Prior to Visit  Medication Sig Dispense Refill  . cholecalciferol (VITAMIN D) 1000 UNITS tablet Take 1,000 Units by mouth daily.     . citalopram (CELEXA) 20 MG tablet TAKE 1 TABLET BY MOUTH  DAILY 90 tablet 0  . dexamethasone (DECADRON) 4 MG tablet Take 1 tablet (4 mg total) by mouth 2 (two) times daily with a meal. 10 tablet 0  . hydrochlorothiazide (HYDRODIURIL) 25 MG tablet TAKE 1 TABLET BY MOUTH  DAILY 90 tablet 0   No facility-administered medications prior to visit.      Allergies: No Known Allergies  Social History  Social History  . Marital status: Married    Spouse name: N/A  . Number of children: 3  . Years of education: N/A   Occupational History  . 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 Smoking status: Never Smoker  . Smokeless tobacco: Never Used  . Alcohol use No  . Drug use: No  . Sexual activity: Yes    Birth control/ protection: Surgical   Other Topics Concern  . Not on file   Social History Narrative  . No narrative on file    Family History  Problem Relation Age of Onset  . Cancer Mother 40       Breast. age 14.   . Colon polyps Mother   . Heart disease Father 49       CABG  . Cancer Sister 16       Lung  . Cancer  Brother 33       Started in the back  . Colon cancer Other        Age 103s. maternal grandmother     Review of Systems:  See HPI for pertinent ROS. All other ROS negative.    Physical Exam: Blood pressure 126/80, pulse (!) 54, temperature 98 F (36.7 C), temperature source Oral, resp. rate 18, height  (1.676 m), weight 179 lb (81.2 kg), SpO2 97 %., Body mass index is 28.89 kg/m. General: WNWD WF. Appears in no acute distress. Neck: Supple. No thyromegaly. No lymphadenopathy. No carotid bruit.  Lungs: Clear bilaterally to auscultation without wheezes, rales, or rhonchi. Breathing is unlabored. Heart: RRR with S1 S2. No murmurs, rubs, or gallops. Abdomen: Soft, non-tender, non-distended with normoactive bowel sounds. No hepatomegaly. No rebound/guarding. No obvious abdominal masses. Musculoskeletal:  Strength and tone normal for age.Full ROM of all joints.  Extremities/Skin: Warm and dry.  No edema.  No rash or suspicious lesions.  Neuro: Alert and oriented X 3. Moves all extremities spontaneously. Gait is normal. CNII-XII grossly in tact. Psych:  Responds to questions appropriately with a normal affect.     ASSESSMENT AND PLAN:  76 y.o. year old female with    1. HYPERTENSION Blood pressure is well controlled. Continue current medication. Check lab to monitor  2. ANXIETY Anxiety is controlled with current dose of Celexa. Continue current medication.  3. POSTMENOPAUSAL OSTEOPOROSIS At her visit 06/16/2013 we discussed that Fosamax should be stopped after being on that therapy for 5 years. She has started Fosamax November 2009. Therefore was do to stop medication November 2014. She did stop the medication at that time. At OV 05/2014--She was still taking calcium but not currently taking  vitamin D sec to finances.  At office visit 11/2014 she states that she is back on the vitamin D and also still taking calcium 07/02/2017: she is taking calcium and vitamin D  Screening Labs:    She had screening labs at [facility] 06/25/2015--all normal.  Visit for preventive health examination - CBC with Differential/Platelet - COMPLETE METABOLIC PANEL WITH GFR - Lipid panel - TSH - Vit D  25 hydroxy (rtn osteoporosis monitoring)    Vitamin D deficiency Was on Vit D in past then at OV 2015--- she said that she was no longer taking this because of financial distress At office visit 11/2014 she reports that she is back on the vitamin D At OV 07/02/2017---says still taking Vitamin D     Mammogram: Patient states that she has had this annually. She says  that she has her mammogram at [facility] every November. Last mammogram 09/29/2016--- negative  Colonoscopy: Patient reports she had this  by Dr. Kendell Bane --  November 29, 2014  Pelvic Exam/ Pap Smear---Pt defers Pelvic Exam. Age 35.   Immunizations: Tetanus: Patient checked regarding cost and he was insurance the cost would be $40 so she has deferred.--11/2014--I gave this to Selena Batten to put into Epic--so will not cont to show up on Report Pneumonia vaccine: Pneumovax given 06/16/2012.                                  Prevnar 13 given here 06/21/2014 Zostavax: She has checked with her insurance regarding her cost and it is too expensive so she has deferred.--11/2014--I gave this to Selena Batten to put into Epic--so will not cont to show up on Report Shingrix---is on company back-order---will discuss this with her once immunization is available again  Flu Vaccine---she is agreeable to get this today---given here 07/02/2017  Routine office visit 6 months or sooner if needed.  Signed, [address] [place], [place], [facility] 07/02/2017 9:06 AM

## 2017-07-02 NOTE — Addendum Note (Signed)
Addended by: Legrand RamsWILLIS, SANDY B on: 07/02/2017 09:27 AM   Modules accepted: Orders

## 2017-07-07 ENCOUNTER — Other Ambulatory Visit: Payer: Self-pay | Admitting: Physician Assistant

## 2017-07-08 ENCOUNTER — Encounter: Payer: Self-pay | Admitting: Family Medicine

## 2017-08-28 ENCOUNTER — Other Ambulatory Visit: Payer: Self-pay | Admitting: Physician Assistant

## 2017-08-28 DIAGNOSIS — Z1231 Encounter for screening mammogram for malignant neoplasm of breast: Secondary | ICD-10-CM

## 2017-10-05 ENCOUNTER — Ambulatory Visit (HOSPITAL_COMMUNITY): Payer: Medicare Other

## 2017-10-12 ENCOUNTER — Ambulatory Visit (HOSPITAL_COMMUNITY)
Admission: RE | Admit: 2017-10-12 | Discharge: 2017-10-12 | Disposition: A | Discharge status: Home / Self Care | Payer: Medicare Other | Source: Ambulatory Visit | Attending: Physician Assistant | Admitting: Physician Assistant

## 2017-10-12 DIAGNOSIS — Z1231 Encounter for screening mammogram for malignant neoplasm of breast: Secondary | ICD-10-CM | POA: Insufficient documentation

## 2017-11-06 ENCOUNTER — Other Ambulatory Visit: Payer: Self-pay

## 2017-11-06 MED ORDER — CITALOPRAM HYDROBROMIDE 20 MG PO TABS: 20.0000 mg | ORAL_TABLET | Freq: Every day | ORAL | 1 refills | Status: DC

## 2017-11-06 MED ORDER — HYDROCHLOROTHIAZIDE 25 MG PO TABS: 25.0000 mg | ORAL_TABLET | Freq: Every day | ORAL | 1 refills | Status: DC

## 2017-11-09 NOTE — Telephone Encounter (Signed)
Patient called and requested [facility] be [facility]

## 2017-12-10 ENCOUNTER — Ambulatory Visit: Payer: Medicare Other | Admitting: Physician Assistant

## 2017-12-11 ENCOUNTER — Ambulatory Visit (INDEPENDENT_AMBULATORY_CARE_PROVIDER_SITE_OTHER): Payer: Medicare HMO | Admitting: Family Medicine

## 2017-12-11 ENCOUNTER — Ambulatory Visit: Payer: Self-pay | Admitting: Family Medicine

## 2017-12-11 VITALS — BP 98/60 | HR 80 | Temp 98.7°F | Resp 18 | Wt 177.0 lb

## 2017-12-11 DIAGNOSIS — B9689 Other specified bacterial agents as the cause of diseases classified elsewhere: Secondary | ICD-10-CM

## 2017-12-11 DIAGNOSIS — J019 Acute sinusitis, unspecified: Secondary | ICD-10-CM | POA: Diagnosis not present

## 2017-12-11 MED ORDER — AMOXICILLIN-POT CLAVULANATE 875-125 MG PO TABS: 1.0000 | ORAL_TABLET | Freq: Two times a day (BID) | ORAL | 0 refills | Status: DC

## 2017-12-11 NOTE — Progress Notes (Signed)
Subjective:    Patient ID: Wendy Newman, female    DOB: Oct 01, 1941, 77 y.o.   MRN: [id number]  HPI Patient states that she has been sick for 2-1/2 weeks.  She reports headache, subjective fevers, significant tenderness to percussion in both maxillary sinuses, postnasal drip, purulent rhinorrhea.  She denies any sore throat.  There is no lymphadenopathy in the neck.  She does have a cough due to postnasal drip however it is nonproductive.  She denies any chest pain or shortness of breath.  She denies any myalgias. Past Medical History:  Diagnosis Date  . Anxiety   . Cataract   . Edema of both legs   . Essential hypertension   . Osteoporosis   . Vitamin D deficiency    Past Surgical History:  Procedure Laterality Date  . ABDOMINAL HYSTERECTOMY     Complete  . CESAREAN SECTION     X 3  . COLONOSCOPY  10/10/2008   [id number]  . COLONOSCOPY WITH PROPOFOL N/A 12/21/2014   Dr. Rourk:noncompliant left colon and redundant right colon, rectal and colonic mucosa appeared normal.    Current Outpatient Medications on File Prior to Visit  Medication Sig Dispense Refill  . cetirizine (ZYRTEC) 10 MG tablet Take 1 tablet (10 mg total) by mouth daily. As needed for allergy symptoms. 30 tablet 11  . cholecalciferol (VITAMIN D) 1000 UNITS tablet Take 1,000 Units by mouth daily.     . citalopram (CELEXA) 20 MG tablet Take 1 tablet (20 mg total) by mouth daily. 90 tablet 1  . hydrochlorothiazide (HYDRODIURIL) 25 MG tablet Take 1 tablet (25 mg total) by mouth daily. 90 tablet 1   No current facility-administered medications on file prior to visit.    No Known Allergies Social History   Socioeconomic History  . Marital status: Married    Spouse name: Not on file  . Number of children: 3  . Years of education: Not on file  . Highest education level: Not on file  Social Needs  . Financial resource strain: Not on file  . Food insecurity - worry: Not on file  . Food insecurity -  inability: Not on file  . Transportation needs - medical: Not on file  . Transportation needs - non-medical: Not on file  Occupational History  . 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 Smoking status: Never Smoker  . Smokeless tobacco: Never Used  Substance and Sexual Activity  . Alcohol use: No    Alcohol/week: 0.0 oz  . Drug use: No  . Sexual activity: Yes    Birth control/protection: Surgical  Other Topics Concern  . Not on file  Social History Narrative  . Not on file      Review of Systems  All other systems reviewed and are negative.      Objective:   Physical Exam  Constitutional: She appears well-developed and well-nourished. No distress.  HENT:  Right Ear: Tympanic membrane, external ear and ear canal normal.  Left Ear: Tympanic membrane, external ear and ear canal normal.  Nose: Mucosal edema and rhinorrhea present. Right sinus exhibits maxillary sinus tenderness and frontal sinus tenderness. Left sinus exhibits maxillary sinus tenderness and frontal sinus tenderness.  Mouth/Throat: Oropharynx is clear and moist. No oropharyngeal exudate.  Neck: Neck supple.  Cardiovascular: Normal rate, regular rhythm and normal heart sounds.  No murmur heard. Pulmonary/Chest: Effort normal and breath sounds normal. No respiratory distress. She has no wheezes. She has no rales.  Skin: She is not diaphoretic.  Vitals reviewed.         Assessment & Plan:  Acute bacterial rhinosinusitis  Patient appears to have developed a bacterial sinus infection.  Begin Augmentin 875 mg.  Use Nettie pot to try to flush her sinuses clear.  Use Coricidin HBP as a decongestant.  Recheck next week if no better or sooner if worse

## 2017-12-30 ENCOUNTER — Ambulatory Visit (INDEPENDENT_AMBULATORY_CARE_PROVIDER_SITE_OTHER): Payer: Medicare HMO | Admitting: Physician Assistant

## 2017-12-30 ENCOUNTER — Encounter: Payer: Self-pay | Admitting: Physician Assistant

## 2017-12-30 ENCOUNTER — Ambulatory Visit: Payer: Medicare Other | Admitting: Physician Assistant

## 2017-12-30 VITALS — BP 122/70 | HR 60 | Temp 98.3°F | Resp 14 | Wt 173.8 lb

## 2017-12-30 DIAGNOSIS — I10 Essential (primary) hypertension: Secondary | ICD-10-CM | POA: Diagnosis not present

## 2017-12-30 DIAGNOSIS — F419 Anxiety disorder, unspecified: Secondary | ICD-10-CM | POA: Diagnosis not present

## 2017-12-30 DIAGNOSIS — E559 Vitamin D deficiency, unspecified: Secondary | ICD-10-CM | POA: Diagnosis not present

## 2017-12-30 DIAGNOSIS — M810 Age-related osteoporosis without current pathological fracture: Secondary | ICD-10-CM

## 2017-12-30 DIAGNOSIS — R69 Illness, unspecified: Secondary | ICD-10-CM | POA: Diagnosis not present

## 2017-12-30 NOTE — Progress Notes (Signed)
Patient ID: Wendy Newman MRN: [id number], DOB: [birth year], 77 y.o. Date of Encounter: @DATE @  Chief Complaint:  Chief Complaint  Patient presents with  . 6 month follow up    HPI: 77 y.o. year old female  presents routine followup office visit.  At her  visit in 05/2013 we discussed stopping Fosamax that she had been on it for 5 years. At f/u OV she stated that she did stop that medicine. She was taking her calcium and vitamin D. At OV-05/2014-she says she is no longer taking the Vitamin D secondary to finances. Back on Vitamin D and Calcium now-- since 12/25/2014 OV  Financial distress: I also see her husband as a patient. I had noticed that he had lost a lot of weight. I further evaluated his weight loss. Found out that they were having severe financial distress. He was eating what he could. At followup he has told me that they are now using 3 different food pantries as well as food from the church. She does not make any further comments or provide any further information regarding the financial distress today except for the fact that she cannot afford to buy over-the-counter vitamin D.  She reports that the Celexa is still working well for her and is controlling her anxiety. Is having no adverse effects. Says that she has "always been a very nervous person" but the Celexa controls this well. At prior OV she mentioned that her 61 year old grandson is living with her and her husband now. says he was wanting to drop out of school and is living with them currently. I discussed that the Celexa does come in a higher dose but she says that this is dose works well for her and she does not need to increase the dose. Says that she mostly needs this "for her nerves" but says that this is controlled at this dose. At OV 12/30/2017--I asked the grandson was still living with them.  She says that he is living with his girlfriend and they have a 32-month-old baby. At OV 12/30/2017 she says that the  Celexa is still working well for her and is causing no adverse effects.  Says " that keeps my nerves controlled"  12/30/2017 She is taking BP med as directed. No lightheadedness or other adverse effects.  In past, prescribed Linzess for constipation---then she had diarrhea.  Then prescribed Amitiza.  At f/u---she says she is having normal BMs with no medication at all now--so not having to use any med for this.   12/30/2017-- she has no specific concerns to address today.  States everything is been stable.   Past Medical History:  Diagnosis Date  . Anxiety   . Cataract   . Edema of both legs   . Essential hypertension   . Osteoporosis   . Vitamin D deficiency      Home Meds:  Outpatient Medications Prior to Visit  Medication Sig Dispense Refill  . cetirizine (ZYRTEC) 10 MG tablet Take 1 tablet (10 mg total) by mouth daily. As needed for allergy symptoms. 30 tablet 11  . cholecalciferol (VITAMIN D) 1000 UNITS tablet Take 1,000 Units by mouth daily.     . citalopram (CELEXA) 20 MG tablet Take 1 tablet (20 mg total) by mouth daily. 90 tablet 1  . hydrochlorothiazide (HYDRODIURIL) 25 MG tablet Take 1 tablet (25 mg total) by mouth daily. 90 tablet 1  . amoxicillin-clavulanate (AUGMENTIN) 875-125 MG tablet Take 1 tablet by mouth 2 (two) times daily.  20 tablet 0   No facility-administered medications prior to visit.      Allergies: No Known Allergies  Social History   Socioeconomic History  . Marital status: Married    Spouse name: Not on file  . Number of children: 3  . Years of education: Not on file  . Highest education level: Not on file  Social Needs  . Financial resource strain: Not on file  . Food insecurity - worry: Not on file  . Food insecurity - inability: Not on file  . Transportation needs - medical: Not on file  . Transportation needs - non-medical: Not on file  Occupational History  . 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 Smoking status:  Never Smoker  . Smokeless tobacco: Never Used  Substance and Sexual Activity  . Alcohol use: No    Alcohol/week: 0.0 oz  . Drug use: No  . Sexual activity: Yes    Birth control/protection: Surgical  Other Topics Concern  . Not on file  Social History Narrative  . Not on file    Family History  Problem Relation Age of Onset  . Cancer Mother 269       Breast. age 75.   . Colon polyps Mother   . Heart disease Father [id number]       CABG  . Cancer Sister [phone]       Lung  . Cancer Brother [id number]       Started in the back  . Colon cancer Other        Age 2450s. maternal grandmother     Review of Systems:  See HPI for pertinent ROS. All other ROS negative.    Physical Exam: Blood pressure 122/70, pulse 60, temperature 98.3 F (36.8 C), temperature source Oral, resp. rate 14, weight 78.8 kg (173 lb 12.8 oz), SpO2 96 %., Body mass index is 28.05 kg/m. General: WNWD WF. Appears in no acute distress. Neck: Supple. No thyromegaly. No lymphadenopathy. No carotid bruit.  Lungs: Clear bilaterally to auscultation without wheezes, rales, or rhonchi. Breathing is unlabored. Heart: RRR with S1 S2. No murmurs, rubs, or gallops. Abdomen: Soft, non-tender, non-distended with normoactive bowel sounds. No hepatomegaly. No rebound/guarding. No obvious abdominal masses. Musculoskeletal:  Strength and tone normal for age.Full ROM of all joints.  Extremities/Skin: Warm and dry.  No LE edema.   Neuro: Alert and oriented X 3. Moves all extremities spontaneously. Gait is normal. CNII-XII grossly in tact. Psych:  Responds to questions appropriately with a normal affect.     ASSESSMENT AND PLAN:  77 y.o. year old female with    1. HYPERTENSION 12/30/2017: Blood pressure is well controlled. Continue current medication. Check lab to monitor  2. ANXIETY 12/30/2017: Anxiety is controlled with current dose of Celexa. Continue current medication.  3. POSTMENOPAUSAL OSTEOPOROSIS At her visit 06/16/2013 we discussed  that Fosamax should be stopped after being on that therapy for 5 years. She has started Fosamax November 2009. Therefore was do to stop medication November 2014. She did stop the medication at that time. At OV 05/2014--She was still taking calcium but not currently taking  vitamin D sec to finances.  At office visit 11/2014 she states that she is back on the vitamin D and also still taking calcium 12/30/2017: she is taking calcium and vitamin D  Screening Labs:  She had screening labs at [facility] 06/25/2015--all normal.  Visit for preventive health examination - CBC with Differential/Platelet - COMPLETE METABOLIC PANEL WITH  GFR - Lipid panel - TSH - Vit D  25 hydroxy (rtn osteoporosis monitoring)    Vitamin D deficiency Was on Vit D in past then at OV 2015--- she said that she was no longer taking this because of financial distress At office visit 11/2014 she reports that she is back on the vitamin D At OV 07/02/2017---says still taking Vitamin D     Mammogram: Patient states that she has had this annually. She says that she has her mamm[facility] at [facility] every November. Last mammogram 10/12/2017--- negative  Colonoscopy: Patient reports she had this  by Dr. Kendell Bane --  November 29, 2014  Pelvic Exam/ Pap Smear---Pt defers Pelvic Exam. Age 39.   Immunizations: Tetanus: Patient checked regarding cost and he was insurance the cost would be $40 so she has deferred.--11/2014--I gave this to Selena Batten to put into Epic--so will not cont to show up on Report Pneumonia vaccine: Pneumovax given 06/16/2012.                                  Prevnar 13 given here 06/21/2014 Zostavax: She has checked with her insurance regarding her cost and it is too expensive so she has deferred.--11/2014--I gave this to Selena Batten to put into Epic--so will not cont to show up on Report Shingrix---is on company back-order---will discuss this with her once immunization is available again  Flu Vaccine---she is agreeable to get this  today---given here 07/02/2017  Routine office visit 6 months or sooner if needed.  [address] [place], [place], [facility] 12/30/2017 4:16 PM

## 2018-02-23 ENCOUNTER — Ambulatory Visit (INDEPENDENT_AMBULATORY_CARE_PROVIDER_SITE_OTHER): Payer: Medicare HMO | Admitting: Family Medicine

## 2018-02-23 ENCOUNTER — Encounter: Payer: Self-pay | Admitting: Family Medicine

## 2018-02-23 ENCOUNTER — Other Ambulatory Visit: Payer: Self-pay

## 2018-02-23 VITALS — BP 128/88 | HR 63 | Temp 98.2°F | Resp 16 | Ht 66.0 in | Wt 180.0 lb

## 2018-02-23 DIAGNOSIS — J069 Acute upper respiratory infection, unspecified: Secondary | ICD-10-CM

## 2018-02-23 DIAGNOSIS — R05 Cough: Secondary | ICD-10-CM

## 2018-02-23 DIAGNOSIS — R059 Cough, unspecified: Secondary | ICD-10-CM

## 2018-02-23 DIAGNOSIS — J302 Other seasonal allergic rhinitis: Secondary | ICD-10-CM | POA: Diagnosis not present

## 2018-02-23 MED ORDER — BENZONATATE 100 MG PO CAPS: 100.0000 mg | ORAL_CAPSULE | Freq: Three times a day (TID) | ORAL | 0 refills | Status: DC | PRN

## 2018-02-23 MED ORDER — MONTELUKAST SODIUM 10 MG PO TABS: 10.0000 mg | ORAL_TABLET | Freq: Every day | ORAL | 1 refills | Status: DC

## 2018-02-23 MED ORDER — MOMETASONE FUROATE 50 MCG/ACT NA SUSP: 2.0000 | Freq: Every day | NASAL | 12 refills | Status: DC

## 2018-02-23 MED ORDER — PREDNISONE 20 MG PO TABS: ORAL_TABLET | ORAL | 0 refills | Status: DC

## 2018-02-23 NOTE — Progress Notes (Signed)
Patient ID: Wendy Newman, female    DOB: [birth year], 77 y.o.   MRN: [id number]  PCP: Dorena Bodo, PA-C  Chief Complaint  Patient presents with  . dry cough    has had for 3 weeks     Subjective:   Wendy Newman is a 77 y.o. female, presents to clinic with CC of 3 weeks nonproductive, dry hacking cough.  Onset of cough is associated with worsening seasonal allergies which she has started taking Zyrtec for but she continues to have sinus pressure that is achy with clear nasal drainage, postnasal drip, itchy watery eyes, sneezing.  She states that all of this drainage constantly tickles back of her throat, causes worse coughing at night or when laying flat because of increased postnasal drip.  She is unsure she is having any fevers because she has hot flashes come and go but she denies any body aches, hot or cold chills, chest pain, shortness of breath, sweats, weight loss.  In the past 3 weeks she is heard some wheezing once which she thought was in her left chest but it cleared up after coughing and she otherwise denies any wheezing shortness of breath or chest tightness.  She has no history of asthma, recurrent bronchitis, lung disease, no smoking history.  She has not been fatigued or weak.  She denies any weight gain or weight loss, orthopnea, PND, lower extremity edema, chest pain, back pain abdominal pain, nausea, vomiting, diarrhea.   Patient Active Problem List   Diagnosis Date Noted  . Allergic rhinoconjunctivitis 07/02/2017  . Family history of colonic polyps 11/28/2014  . Family history of colon cancer 11/28/2014  . Constipation 11/28/2014  . Vitamin D deficiency 06/16/2013  . Hypertension   . Anxiety   . Osteoporosis   . Cataract   . Hyperlipemia 05/18/2009  . Anxiety state 05/18/2009  . NECK PAIN 05/18/2009  . ALLERGIC RHINITIS 08/28/2008  . OSTEOARTHRITIS 08/28/2008  . POSTMENOPAUSAL OSTEOPOROSIS 08/28/2008  . PAT 04/24/2008  . OTHER ABNORMAL BLOOD CHEMISTRY  04/24/2008  . Essential hypertension 12/16/2007  . HEADACHE 12/16/2007     Prior to Admission medications   Medication Sig Start Date End Date Taking? Authorizing Provider  cetirizine (ZYRTEC) 10 MG tablet Take 1 tablet (10 mg total) by mouth daily. As needed for allergy symptoms. 07/02/17  Yes Allayne Butcher B, PA-C  cholecalciferol (VITAMIN D) 1000 UNITS tablet Take 1,000 Units by mouth daily.    Yes [provider]  citalopram (CELEXA) 20 MG tablet Take 1 tablet (20 mg total) by mouth daily. 11/06/17  Yes Dorena Bodo, PA-C  hydrochlorothiazide (HYDRODIURIL) 25 MG tablet Take 1 tablet (25 mg total) by mouth daily. 11/06/17  Yes Allayne Butcher B, PA-C  benzonatate (TESSALON) 100 MG capsule Take 1 capsule (100 mg total) by mouth 3 (three) times daily as needed for cough. 02/23/18   Danelle Berry, PA-C  mometasone (NASONEX) 50 MCG/ACT nasal spray Place 2 sprays into the nose daily. 02/23/18   Danelle Berry, PA-C  montelukast (SINGULAIR) 10 MG tablet Take 1 tablet (10 mg total) by mouth at bedtime. 02/23/18   Danelle Berry, PA-C  predniSONE (DELTASONE) 20 MG tablet Take 3 daily for 2 days, then 2 daily for 2 days, then 1 daily for 2 days. 02/23/18   Danelle Berry, PA-C     No Known Allergies   Family History  Problem Relation Age of Onset  . Cancer Mother 31       Breast.  age 92.   . Colon polyps Mother   . Heart disease Father 30       CABG  . Cancer Sister 64       Lung  . Cancer Brother 86       Started in the back  . Colon cancer Other        Age 31s. maternal grandmother     Social History   Socioeconomic History  . Marital status: Married    Spouse name: Not on file  . Number of children: 3  . Years of education: Not on file  . Highest education level: Not on file  Occupational History  . 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 Financial resource strain: Not on file  . Food insecurity:    Worry: Not on file    Inability: Not on file  .  Transportation needs:    Medical: Not on file    Non-medical: Not on file  Tobacco Use  . Smoking status: Never Smoker  . Smokeless tobacco: Never Used  Substance and Sexual Activity  . Alcohol use: No    Alcohol/week: 0.0 oz  . Drug use: No  . Sexual activity: Yes    Birth control/protection: Surgical  Lifestyle  . Physical activity:    Days per week: Not on file    Minutes per session: Not on file  . Stress: Not on file  Relationships  . Social connections:    Talks on phone: Not on file    Gets together: Not on file    Attends religious service: Not on file    Active member of club or organization: Not on file    Attends meetings of clubs or organizations: Not on file    Relationship status: Not on file  . Intimate partner violence:    Fear of current or ex partner: Not on file    Emotionally abused: Not on file    Physically abused: Not on file    Forced sexual activity: Not on file  Other Topics Concern  . Not on file  Social History Narrative  . Not on file     Review of Systems  Constitutional: Negative.  Negative for activity change, appetite change, chills, diaphoresis, fatigue and unexpected weight change.  HENT: Positive for congestion, postnasal drip, sinus pressure and sneezing. Negative for dental problem, drooling, ear discharge, ear pain, facial swelling, hearing loss, mouth sores, nosebleeds, sore throat, tinnitus, trouble swallowing and voice change.   Eyes: Negative.   Respiratory: Positive for cough. Negative for apnea, choking, chest tightness, shortness of breath, wheezing and stridor.   Cardiovascular: Negative for chest pain, palpitations and leg swelling.  Gastrointestinal: Negative.  Negative for abdominal pain.  Endocrine: Negative.   Genitourinary: Negative.   Musculoskeletal: Negative.   Skin: Negative.  Negative for color change, pallor and rash.  Neurological: Negative.  Negative for dizziness, tremors, syncope, weakness, light-headedness  and headaches.  Hematological: Negative.   Psychiatric/Behavioral: Negative.   All other systems reviewed and are negative.      Objective:    Vitals:   02/23/18 0919  BP: 128/88  Pulse: 63  Resp: 16  Temp: 98.2 F (36.8 C)  TempSrc: Oral  SpO2: 98%  Weight: 180 lb (81.6 kg)  Height:  (1.676 m)      Physical Exam  Constitutional: She is oriented to person, place, and time. She appears well-developed and well-nourished.  Non-toxic appearance. No distress.  Well-appearing elderly  female, no acute distress  HENT:  Head: Normocephalic and atraumatic.  Right Ear: External ear normal.  Left Ear: External ear normal.  Mouth/Throat: Uvula is midline, oropharynx is clear and moist and mucous membranes are normal. No oropharyngeal exudate.  Nasal mucosa thematous mildly erythematous, clear drainage Postnasal drip Posterior oropharynx without erythema, edema, uvula midline, tonsils 1+ bilaterally, no exudate No cervical lymphadenopathy No sinus tenderness to palpation TMs bilaterally normal  Eyes: Pupils are equal, round, and reactive to light. Conjunctivae, EOM and lids are normal. Right eye exhibits no discharge. Left eye exhibits no discharge. No scleral icterus.  Neck: Normal range of motion and phonation normal. Neck supple. No tracheal deviation present. No thyromegaly present.  Cardiovascular: Normal rate, regular rhythm, normal heart sounds, intact distal pulses and normal pulses. Exam reveals no gallop and no friction rub.  No murmur heard. Pulses:      Radial pulses are 2+ on the right side, and 2+ on the left side.       Posterior tibial pulses are 2+ on the right side, and 2+ on the left side.  Bilateral lower extremity mild nonpitting edema to bilateral ankles  Pulmonary/Chest: Effort normal and breath sounds normal. No stridor. No respiratory distress. She has no wheezes. She has no rhonchi. She has no rales. She exhibits no tenderness.  Frequent cough, normal  inspiratory effort, no splinting, symmetrical chest expansion, no retractions, clear to auscultation anteriorly and posteriorly no wheeze, crackles, rhonchi No chest wall tenderness to palpation  Abdominal: Soft. Normal appearance and bowel sounds are normal. She exhibits no distension and no mass. There is no tenderness. There is no rebound and no guarding.  Musculoskeletal: Normal range of motion. She exhibits no deformity.  Lymphadenopathy:    She has no cervical adenopathy.  Neurological: She is alert and oriented to person, place, and time. Coordination and gait normal.  Mild tremor of bilateral upper extremities  Skin: Skin is warm, dry and intact. Capillary refill takes less than 2 seconds. No rash noted. She is not diaphoretic. No cyanosis or erythema. No pallor. Nails show no clubbing.  Psychiatric: She has a normal mood and affect. Her speech is normal and behavior is normal.  Nursing note and vitals reviewed.         Assessment & Plan:      ICD-10-CM   1. Upper respiratory tract infection, unspecified type J06.9   2. Cough R05 predniSONE (DELTASONE) 20 MG tablet    benzonatate (TESSALON) 100 MG capsule  3. Seasonal allergies J30.2 predniSONE (DELTASONE) 20 MG tablet    montelukast (SINGULAIR) 10 MG tablet    mometasone (NASONEX) 50 MCG/ACT nasal spray    77 year old female with history of hypertension presents with URI symptoms, seasonal allergy symptoms and dry cough for 3 weeks.  Feels mostly upper respiratory symptoms with nasal congestion postnasal drip and nonproductive cough.  No symptoms concerning for pneumonia.  Physical exam consistent with URI and allergic rhinitis recently started treatment with Zyrtec, will add Singulair and Nasonex to get control of allergies.  Lungs were clear to auscultation, advised patient to get Mucinex, will give Tessalon Perles to help with cough, short steroid burst to try and help with all of her symptoms.  Antibiotics currently not  indicated feel symptoms are likely allergic and viral in nature she has no sinus tenderness, lungs are clear, no concern currently for any acute bacterial infection.  Discussed with patient to return or call if any of her symptoms are worsening including  worsening of cough with any chest tightness or shortness of breath, sweats or fever.    Patient is well-appearing, vital signs are stable and within normal limits, discussed plan with her, printed out AVS, reviewed with the patient supportive measures and new prescriptions, including continuing allergy medicines throughout spring and possibly summer seasons.  She verbalized understanding and was in agreement with plan.  Danelle Berry, PA-C 02/23/18 9:44 AM

## 2018-02-23 NOTE — Patient Instructions (Addendum)
Get mucinex to help with your cough.   Allergies, Adult An allergy is when your body's defense system (immune system) overreacts to an otherwise harmless substance (allergen) that you breathe in or eat or something that touches your skin. When you come into contact with something that you are allergic to, your immune system produces certain proteins (antibodies). These proteins cause cells to release chemicals (histamines) that trigger the symptoms of an allergic reaction. Allergies often affect the nasal passages (allergic rhinitis), eyes (allergic conjunctivitis), skin (atopic dermatitis), and stomach. Allergies can be mild or severe. Allergies cannot spread from person to person (are not contagious). They can develop at any age and may be outgrown. What increases the risk? You may be at greater risk of allergies if other people in your family have allergies. What are the signs or symptoms? Symptoms depend on what type of allergy you have. They may include:  Runny, stuffy nose.  Sneezing.  Itchy mouth, ears, or throat.  Postnasal drip.  Sore throat.  Itchy, red, watery, or puffy eyes.  Skin rash or hives.  Stomach pain.  Vomiting.  Diarrhea.  Bloating.  Wheezing or coughing.  People with a severe allergy to food, medicine, or an insect bite may have a life-threatening allergic reaction (anaphylaxis). Symptoms of anaphylaxis include:  Hives.  Itching.  Flushed face.  Swollen lips, tongue, or mouth.  Tight or swollen throat.  Chest pain or tightness in the chest.  Trouble breathing or shortness of breath.  Rapid heartbeat.  Dizziness or fainting.  Vomiting.  Diarrhea.  Pain in the abdomen.  How is this diagnosed? This condition is diagnosed based on:  Your symptoms.  Your family and medical history.  A physical exam.  You may need to see a health care provider who specializes in treating allergies (allergist). You may also have tests,  including:  Skin tests to see which allergens are causing your symptoms, such as: ? Skin prick test. In this test, your skin is pricked with a tiny needle and exposed to small amounts of possible allergens to see if your skin reacts. ? Intradermal skin test. In this test, a small amount of allergen is injected under your skin to see if your skin reacts. ? Patch test. In this test, a small amount of allergen is placed on your skin and then your skin is covered with a bandage. Your health care provider will check your skin after a couple of days to see if a rash has developed.  Blood tests.  Challenges tests. In this test, you inhale a small amount of allergen by mouth to see if you have an allergic reaction.  You may also be asked to:  Keep a food diary. A food diary is a record of all the foods and drinks you have in a day and any symptoms you experience.  Practice an elimination diet. An elimination diet involves eliminating specific foods from your diet and then adding them back in one by one to find out if a certain food causes an allergic reaction.  How is this treated? Treatment for allergies depends on your symptoms. Treatment may include:  Cold compresses to soothe itching and swelling.  Eye drops.  Nasal sprays.  Using a saline spray or container (neti pot) to flush out the nose (nasal irrigation). These methods can help clear away mucus and keep the nasal passages moist.  Using a humidifier.  Oral antihistamines or other medicines to block allergic reaction and inflammation.  Skin creams  to treat rashes or itching.  Diet changes to eliminate food allergy triggers.  Repeated exposure to tiny amounts of allergens to build up a tolerance and prevent future allergic reactions (immunotherapy). These include: ? Allergy shots. ? Oral treatment. This involves taking small doses of an allergen under the tongue (sublingual immunotherapy).  Emergency epinephrine injection  (auto-injector) in case of an allergic emergency. This is a self-injectable, pre-measured medicine that must be given within the first few minutes of a serious allergic reaction.  Follow these instructions at home:  Avoid known allergens whenever possible.  If you suffer from airborne allergens, wash out your nose daily. You can do this with a saline spray or a neti pot to flush out your nose (nasal irrigation).  Take over-the-counter and prescription medicines only as told by your health care provider.  Keep all follow-up visits as told by your health care provider. This is important.  If you are at risk of a severe allergic reaction (anaphylaxis), keep your auto-injector with you at all times.  If you have ever had anaphylaxis, wear a medical alert bracelet or necklace that states you have a severe allergy. Contact a health care provider if:  Your symptoms do not improve with treatment. Get help right away if:  You have symptoms of anaphylaxis, such as: ? Swollen mouth, tongue, or throat. ? Pain or tightness in your chest. ? Trouble breathing or shortness of breath. ? Dizziness or fainting. ? Severe abdominal pain, vomiting, or diarrhea. This information is not intended to replace advice given to you by your health care provider. Make sure you discuss any questions you have with your health care provider. Document Released: 01/06/2003 Document Revised: 02/11/2017 Document Reviewed: 04/30/2016 Elsevier Interactive Patient Education  2018 Else[employer] Inc[employer]   Upper Respiratory Infection, Adult Most upper respiratory infections (URIs) are caused by a virus. A URI affects the nose, throat, and upper air passages. The most common type of URI is often called "the common cold." Follow these instructions at home:  Take medicines only as told by your doctor.  Gargle warm saltwater or take cough drops to comfort your throat as told by your doctor.  Use a warm mist humidifier or inhale  steam from a shower to increase air moisture. This may make it easier to breathe.  Drink enough fluid to keep your pee (urine) clear or pale yellow.  Eat soups and other clear broths.  Have a healthy diet.  Rest as needed.  Go back to work when your fever is gone or your doctor says it is okay. ? You may need to stay home longer to avoid giving your URI to others. ? You can also wear a face mask and wash your hands often to prevent spread of the virus.  Use your inhaler more if you have asthma.  Do not use any tobacco products, including cigarettes, chewing tobacco, or electronic cigarettes. If you need help quitting, ask your doctor. Contact a doctor if:  You are getting worse, not better.  Your symptoms are not helped by medicine.  You have chills.  You are getting more short of breath.  You have brown or red mucus.  You have yellow or brown discharge from your nose.  You have pain in your face, especially when you bend forward.  You have a fever.  You have puffy (swollen) neck glands.  You have pain while swallowing.  You have white areas in the back of your throat. Get help right away if:  You have very bad or constant: ? Headache. ? Ear pain. ? Pain in your forehead, behind your eyes, and over your cheekbones (sinus pain). ? Chest pain.  You have long-lasting (chronic) lung disease and any of the following: ? Wheezing. ? Long-lasting cough. ? Coughing up blood. ? A change in your usual mucus.  You have a stiff neck.  You have changes in your: ? Vision. ? Hearing. ? Thinking. ? Mood. This information is not intended to replace advice given to you by your health care provider. Make sure you discuss any questions you have with your health care provider. Document Released: 03/31/2008 Document Revised: 06/15/2016 Document Reviewed: 01/18/2014 Elsevier Interactive Patient Education  2018 Elsevier Inc.    Cough, Adult Coughing is a reflex that clears  your throat and your airways. Coughing helps to heal and protect your lungs. It is normal to cough occasionally, but a cough that happens with other symptoms or lasts a long time may be a sign of a condition that needs treatment. A cough may last only 2-3 weeks (acute), or it may last longer than 8 weeks (chronic). What are the causes? Coughing is commonly caused by:  Breathing in substances that irritate your lungs.  A viral or bacterial respiratory infection.  Allergies.  Asthma.  Postnasal drip.  Smoking.  Acid backing up from the stomach into the esophagus (gastroesophageal reflux).  Certain medicines.  Chronic lung problems, including COPD (or rarely, lung cancer).  Other medical conditions such as heart failure.  Follow these instructions at home: Pay attention to any changes in your symptoms. Take these actions to help with your discomfort:  Take medicines only as told by your health care provider. ? If you were prescribed an antibiotic medicine, take it as told by your health care provider. Do not stop taking the antibiotic even if you start to feel better. ? Talk with your health care provider before you take a cough suppressant medicine.  Drink enough fluid to keep your urine clear or pale yellow.  If the air is dry, use a cold steam vaporizer or humidifier in your bedroom or your home to help loosen secretions.  Avoid anything that causes you to cough at work or at home.  If your cough is worse at night, try sleeping in a semi-upright position.  Avoid cigarette smoke. If you smoke, quit smoking. If you need help quitting, ask your health care provider.  Avoid caffeine.  Avoid alcohol.  Rest as needed.  Contact a health care provider if:  You have new symptoms.  You cough up pus.  Your cough does not get better after 2-3 weeks, or your cough gets worse.  You cannot control your cough with suppressant medicines and you are losing sleep.  You develop  pain that is getting worse or pain that is not controlled with pain medicines.  You have a fever.  You have unexplained weight loss.  You have night sweats. Get help right away if:  You cough up blood.  You have difficulty breathing.  Your heartbeat is very fast. This information is not intended to replace advice given to you by your health care provider. Make sure you discuss any questions you have with your health care provider. Document Released: 04/11/2011 Document Revised: 03/20/2016 Document Reviewed: 12/20/2014 Elsevier Interactive Patient Education  [employer].

## 2018-02-26 ENCOUNTER — Other Ambulatory Visit: Payer: Self-pay | Admitting: Family Medicine

## 2018-02-26 ENCOUNTER — Telehealth: Payer: Self-pay | Admitting: Family Medicine

## 2018-02-26 NOTE — Telephone Encounter (Signed)
Patient called in stating that she was unable to get tessalon perles, and nasonex at the pharmacy that was called in a few days ago. Patient states that she was able to pick up the prednisone, and singulair because they were covered 100% by her insurance. States she can not afford any medications at this time that require any type of copay. Please advise?

## 2018-03-02 NOTE — Telephone Encounter (Signed)
Patient called to check on the status of this message. Patient would like to know is there anything else that can be called in?

## 2018-03-02 NOTE — Telephone Encounter (Signed)
Left message return call

## 2018-03-02 NOTE — Telephone Encounter (Signed)
Spoke with patient and informed her per Wendy Newman- She can get any over-the-counter enteric steroid nasal spray there is both generic of Nasonex and Flonase.  She can use the website goodrx.com to look these up and find the best price for her. She can use good Rx also to look up better prices of [employer] and we can send them to whatever pharmacy she prefers. She should try Mucinex daily with drinking plenty of fluids. If her cough is getting worse she should come in to be rechecked. Patient verbalized understanding and stated that she will try something over the counter.

## 2018-03-02 NOTE — Telephone Encounter (Signed)
She can get any over-the-counter enteric steroid nasal spray there is both generic of Nasonex and Flonase.  She can use the website goodrx.com to look these up and find the best price for her. She can use good Rx also to look up better prices of [employer] and we can send them to whatever pharmacy she prefers. She should try Mucinex daily with drinking plenty of fluids. If her cough is getting worse she should come in to be rechecked

## 2018-05-31 ENCOUNTER — Other Ambulatory Visit: Payer: Self-pay | Admitting: Physician Assistant

## 2018-06-04 ENCOUNTER — Encounter: Payer: Self-pay | Admitting: Physician Assistant

## 2018-07-01 DIAGNOSIS — H5203 Hypermetropia, bilateral: Secondary | ICD-10-CM | POA: Diagnosis not present

## 2018-07-01 DIAGNOSIS — H40013 Open angle with borderline findings, low risk, bilateral: Secondary | ICD-10-CM | POA: Diagnosis not present

## 2018-07-01 DIAGNOSIS — H52223 Regular astigmatism, bilateral: Secondary | ICD-10-CM | POA: Diagnosis not present

## 2018-07-01 DIAGNOSIS — H524 Presbyopia: Secondary | ICD-10-CM | POA: Diagnosis not present

## 2018-07-05 ENCOUNTER — Ambulatory Visit: Payer: Medicare HMO | Admitting: Physician Assistant

## 2018-07-07 ENCOUNTER — Encounter: Payer: Self-pay | Admitting: Physician Assistant

## 2018-07-07 ENCOUNTER — Ambulatory Visit (INDEPENDENT_AMBULATORY_CARE_PROVIDER_SITE_OTHER): Payer: Medicare HMO | Admitting: Physician Assistant

## 2018-07-07 VITALS — BP 126/70 | HR 53 | Temp 98.0°F | Resp 16 | Ht 66.0 in | Wt 188.8 lb

## 2018-07-07 DIAGNOSIS — F419 Anxiety disorder, unspecified: Secondary | ICD-10-CM

## 2018-07-07 DIAGNOSIS — I10 Essential (primary) hypertension: Secondary | ICD-10-CM

## 2018-07-07 DIAGNOSIS — R69 Illness, unspecified: Secondary | ICD-10-CM | POA: Diagnosis not present

## 2018-07-07 LAB — BASIC METABOLIC PANEL WITH GFR
BUN/Creatinine Ratio: 19 (calc) (ref 6–22)
BUN: 18 mg/dL (ref 7–25)
CALCIUM: 8.9 mg/dL (ref 8.6–10.4)
CO2: 25 mmol/L (ref 20–32)
CREATININE: 0.95 mg/dL — AB (ref 0.60–0.93)
Chloride: 108 mmol/L (ref 98–110)
GFR, EST AFRICAN AMERICAN: 68 mL/min/{1.73_m2} (ref >=60)
GFR, EST NON AFRICAN AMERICAN: 59 mL/min/{1.73_m2} — AB (ref >=60)
Glucose, Bld: 91 mg/dL (ref 65–99)
POTASSIUM: 4.6 mmol/L (ref 3.5–5.3)
Sodium: 140 mmol/L (ref 135–146)

## 2018-07-07 NOTE — Progress Notes (Signed)
Patient ID: JEANNIFER DRAKEFORD MRN: [id number], DOB: 1940-12-02, 77 y.o. Date of Encounter: @DATE @  Chief Complaint:  Chief Complaint  Patient presents with  . Hypertension  . Anxiety    HPI: 77 y.o. year old female  presents routine followup office visit.  At her  visit in 05/2013 we discussed stopping Fosamax that she had been on it for 5 years. At f/u OV she stated that she did stop that medicine. She was taking her calcium and vitamin D. At OV-05/2014-she says she is no longer taking the Vitamin D secondary to finances. Back on Vitamin D and Calcium now-- since 12/25/2014 OV  Financial distress: I also see her husband as a patient. I had noticed that he had lost a lot of weight. I further evaluated his weight loss. Found out that they were having severe financial distress. He was eating what he could. At followup he has told me that they are now using 3 different food pantries as well as food from the church. She does not make any further comments or provide any further information regarding the financial distress today except for the fact that she cannot afford to buy over-the-counter vitamin D.  She reports that the Celexa is still working well for her and is controlling her anxiety. Is having no adverse effects. Says that she has "always been a very nervous person" but the Celexa controls this well. At prior OV she mentioned that her 48 year old grandson is living with her and her husband now. says he was wanting to drop out of school and is living with them currently. I discussed that the Celexa does come in a higher dose but she says that this is dose works well for her and she does not need to increase the dose. Says that she mostly needs this "for her nerves" but says that this is controlled at this dose. At OV 12/30/2017--I asked the grandson was still living with them.  She says that he is living with his girlfriend and they have a 52-month-old baby. At OV 12/30/2017 she says that  the Celexa is still working well for her and is causing no adverse effects.  Says " that keeps my nerves controlled"  12/30/2017 She is taking BP med as directed. No lightheadedness or other adverse effects.  In past, prescribed Linzess for constipation---then she had diarrhea.  Then prescribed Amitiza.  At f/u---she says she is having normal BMs with no medication at all now--so not having to use any med for this.   12/30/2017-- she has no specific concerns to address today.  States everything is been stable.   07/07/2018: She reports that she is taking her blood pressure medication daily.  This is causing no lightheadedness or other adverse effects.  Noted this that her current BP med is HCTZ . She reports that she has been having a little bit of swelling in her feet recently.  I discussed that she is already on a diuretic and that I really do not want to switch her to a stronger diuretic unless necessary.  She had been out in the heat but she says she has not.  Discussed foods that have high sodium and discussed trying to decrease sodium in her diet.  She is to monitor her swelling and if this persists or worsens then let me know.  Otherwise, will continue current meds the same. She states that the Celexa is working well at controlling her anxiety.  It is causing no adverse  effects. Reports that otherwise things have all been stable.  Has no specific concerns to address. Her only other update is that she found out she does have cataracts and will need to have cataract surgery to the right eye in December.     Past Medical History:  Diagnosis Date  . Anxiety   . Cataract   . Edema of both legs   . Essential hypertension   . Osteoporosis   . Vitamin D deficiency      Home Meds:  Outpatient Medications Prior to Visit  Medication Sig Dispense Refill  . cetirizine (ZYRTEC) 10 MG tablet Take 1 tablet (10 mg total) by mouth daily. As needed for allergy symptoms. 30 tablet 11  .  cholecalciferol (VITAMIN D) 1000 UNITS tablet Take 1,000 Units by mouth daily.     . citalopram (CELEXA) 20 MG tablet Take 1 tablet (20 mg total) by mouth daily. 90 tablet 1  . hydrochlorothiazide (HYDRODIURIL) 25 MG tablet TAKE 1 TABLET DAILY 90 tablet 1  . mometasone (NASONEX) 50 MCG/ACT nasal spray Place 2 sprays into the nose daily. 17 g 12  . montelukast (SINGULAIR) 10 MG tablet Take 1 tablet (10 mg total) by mouth at bedtime. 30 tablet 1  . benzonatate (TESSALON) 100 MG capsule Take 1 capsule (100 mg total) by mouth 3 (three) times daily as needed for cough. 30 capsule 0  . predniSONE (DELTASONE) 20 MG tablet Take 3 daily for 2 days, then 2 daily for 2 days, then 1 daily for 2 days. 12 tablet 0   No facility-administered medications prior to visit.      Allergies: No Known Allergies  Social History   Socioeconomic History  . Marital status: Married    Spouse name: Not on file  . Number of children: 3  . Years of education: Not on file  . Highest education level: Not on file  Occupational History  . 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 Financial resource strain: Not on file  . Food insecurity:    Worry: Not on file    Inability: Not on file  . Transportation needs:    Medical: Not on file    Non-medical: Not on file  Tobacco Use  . Smoking status: Never Smoker  . Smokeless tobacco: Never Used  Substance and Sexual Activity  . Alcohol use: No    Alcohol/week: 0.0 standard drinks  . Drug use: No  . Sexual activity: Yes    Birth control/protection: Surgical  Lifestyle  . Physical activity:    Days per week: Not on file    Minutes per session: Not on file  . Stress: Not on file  Relationships  . Social connections:    Talks on phone: Not on file    Gets together: Not on file    Attends religious service: Not on file    Active member of club or organization: Not on file    Attends meetings of clubs or organizations: Not on file     Relationship status: Not on file  . Intimate partner violence:    Fear of current or ex partner: Not on file    Emotionally abused: Not on file    Physically abused: Not on file    Forced sexual activity: Not on file  Other Topics Concern  . Not on file  Social History Narrative  . Not on file    Family History  Problem Relation Age of Onset  . Cancer  Mother 88       Breast. age 61.   . Colon polyps Mother   . Heart disease Father 34       CABG  . Cancer Sister 20       Lung  . Cancer Brother 8       Started in the back  . Colon cancer Other        Age 39s. maternal grandmother     Review of Systems:  See HPI for pertinent ROS. All other ROS negative.    Physical Exam: Blood pressure 126/70, pulse (!) 53, temperature 98 F (36.7 C), temperature source Oral, resp. rate 16, height 5\' 6"  (1.676 m), weight 85.6 kg, SpO2 97 %., Body mass index is 30.47 kg/m. General: WNWD WF. Appears in no acute distress. Neck: Supple. No thyromegaly. No lymphadenopathy. No carotid bruits. Lungs: Clear bilaterally to auscultation without wheezes, rales, or rhonchi. Breathing is unlabored. Heart: RRR with S1 S2. No murmurs, rubs, or gallops. Abdomen: Soft, non-tender, non-distended with normoactive bowel sounds. No hepatomegaly. No rebound/guarding. No obvious abdominal masses. Musculoskeletal:  Strength and tone normal for age. Extremities/Skin: Warm and dry. No LE edema on exam today.  Neuro: Alert and oriented X 3. Moves all extremities spontaneously. Gait is normal. CNII-XII grossly in tact. Psych:  Responds to questions appropriately with a normal affect.     ASSESSMENT AND PLAN:  77 y.o. year old female with    1. HYPERTENSION 07/07/2018: Blood pressure is well controlled. Continue current medication. Check lab to monitor  2. ANXIETY 07/07/2018: Anxiety is controlled with current dose of Celexa. Continue current medication.  3. POSTMENOPAUSAL OSTEOPOROSIS At her visit  06/16/2013 we discussed that Fosamax should be stopped after being on that therapy for 5 years. She has started Fosamax November 2009. Therefore was do to stop medication November 2014. She did stop the medication at that time. At OV 05/2014--She was still taking calcium but not currently taking  vitamin D sec to finances.  At office visit 11/2014 she states that she is back on the vitamin D and also still taking calcium 07/07/2018: she is taking calcium and vitamin D  Screening Labs:  She had screening labs at [facility] 06/25/2015--all normal.  Visit for preventive health examination - CBC with Differential/Platelet - COMPLETE METABOLIC PANEL WITH GFR - Lipid panel - TSH - Vit D  25 hydroxy (rtn osteoporosis monitoring)    Vitamin D deficiency Was on Vit D in past then at OV 2015--- she said that she was no longer taking this because of financial distress At office visit 11/2014 she reports that she is back on the vitamin D At OV 07/07/2018---says still taking Vitamin D     Mammogram: Patient states that she has had this annually. She says that she has her mamm[facility] at [facility] every November. Last mammogram 10/12/2017--- negative  Colonoscopy: Patient reports she had this  by Dr. Kendell Bane --  November 29, 2014  Pelvic Exam/ Pap Smear---Pt defers Pelvic Exam. Age 72.   Immunizations: Tetanus: Patient checked regarding cost and he was insurance the cost would be $40 so she has deferred.--11/2014--I gave this to Selena Batten to put into Epic--so will not cont to show up on Report Pneumonia vaccine: Pneumovax given 06/16/2012.                                  Prevnar 13 given here 06/21/2014 Zostavax: She has  checked with her insurance regarding her cost and it is too expensive so she has deferred.--11/2014--I gave this to Selena Batten to put into Epic--so will not cont to show up on Report Shingrix---is on company back-order---will discuss this with her once immunization is available again  Flu Vaccine------given here  07/02/2017  Routine office visit 6 months or sooner if needed.  [address] [place], [place], [facility] 07/07/2018 9:25 AM

## 2018-07-16 ENCOUNTER — Telehealth: Payer: Self-pay | Admitting: Physician Assistant

## 2018-07-16 MED ORDER — CITALOPRAM HYDROBROMIDE 20 MG PO TABS: 20.0000 mg | ORAL_TABLET | Freq: Every day | ORAL | 1 refills | Status: DC

## 2018-07-16 NOTE — Telephone Encounter (Signed)
rx sent to pharmacy

## 2018-07-16 NOTE — Telephone Encounter (Signed)
Refill on citalopram to [facility] order

## 2018-08-26 DIAGNOSIS — R69 Illness, unspecified: Secondary | ICD-10-CM | POA: Diagnosis not present

## 2018-09-02 ENCOUNTER — Other Ambulatory Visit: Payer: Self-pay | Admitting: Physician Assistant

## 2018-09-02 DIAGNOSIS — Z1231 Encounter for screening mammogram for malignant neoplasm of breast: Secondary | ICD-10-CM

## 2018-10-05 DIAGNOSIS — I10 Essential (primary) hypertension: Secondary | ICD-10-CM | POA: Diagnosis not present

## 2018-10-05 DIAGNOSIS — H25043 Posterior subcapsular polar age-related cataract, bilateral: Secondary | ICD-10-CM | POA: Diagnosis not present

## 2018-10-05 DIAGNOSIS — H2513 Age-related nuclear cataract, bilateral: Secondary | ICD-10-CM | POA: Diagnosis not present

## 2018-10-05 DIAGNOSIS — H18413 Arcus senilis, bilateral: Secondary | ICD-10-CM | POA: Diagnosis not present

## 2018-10-05 DIAGNOSIS — H25013 Cortical age-related cataract, bilateral: Secondary | ICD-10-CM | POA: Diagnosis not present

## 2018-10-05 DIAGNOSIS — H2511 Age-related nuclear cataract, right eye: Secondary | ICD-10-CM | POA: Diagnosis not present

## 2018-10-05 DIAGNOSIS — H40013 Open angle with borderline findings, low risk, bilateral: Secondary | ICD-10-CM | POA: Diagnosis not present

## 2018-10-11 DIAGNOSIS — H2511 Age-related nuclear cataract, right eye: Secondary | ICD-10-CM | POA: Diagnosis not present

## 2018-10-12 DIAGNOSIS — H2512 Age-related nuclear cataract, left eye: Secondary | ICD-10-CM | POA: Diagnosis not present

## 2018-10-14 ENCOUNTER — Other Ambulatory Visit: Payer: Self-pay | Admitting: Family Medicine

## 2018-10-14 ENCOUNTER — Ambulatory Visit (HOSPITAL_COMMUNITY)
Admission: RE | Admit: 2018-10-14 | Discharge: 2018-10-14 | Disposition: A | Discharge status: Home / Self Care | Payer: Medicare HMO | Source: Ambulatory Visit | Attending: Physician Assistant | Admitting: Physician Assistant

## 2018-10-14 ENCOUNTER — Encounter (HOSPITAL_COMMUNITY): Payer: Self-pay

## 2018-10-14 DIAGNOSIS — Z1231 Encounter for screening mammogram for malignant neoplasm of breast: Secondary | ICD-10-CM | POA: Diagnosis not present

## 2018-10-29 DIAGNOSIS — H2512 Age-related nuclear cataract, left eye: Secondary | ICD-10-CM | POA: Diagnosis not present

## 2018-12-20 ENCOUNTER — Other Ambulatory Visit: Payer: Self-pay

## 2018-12-20 MED ORDER — HYDROCHLOROTHIAZIDE 25 MG PO TABS: 25.0000 mg | ORAL_TABLET | Freq: Every day | ORAL | 0 refills | Status: DC

## 2018-12-20 MED ORDER — CITALOPRAM HYDROBROMIDE 20 MG PO TABS: 20.0000 mg | ORAL_TABLET | Freq: Every day | ORAL | 1 refills | Status: DC

## 2019-01-06 ENCOUNTER — Ambulatory Visit: Payer: Medicare HMO | Admitting: Physician Assistant

## 2019-01-10 ENCOUNTER — Ambulatory Visit: Payer: Medicare HMO | Admitting: Family Medicine

## 2019-01-11 ENCOUNTER — Other Ambulatory Visit: Payer: Self-pay

## 2019-01-11 ENCOUNTER — Encounter: Payer: Self-pay | Admitting: Family Medicine

## 2019-01-11 ENCOUNTER — Ambulatory Visit (INDEPENDENT_AMBULATORY_CARE_PROVIDER_SITE_OTHER): Payer: Medicare HMO | Admitting: Family Medicine

## 2019-01-11 VITALS — BP 112/62 | HR 56 | Temp 98.0°F | Resp 18 | Ht 66.0 in | Wt 181.0 lb

## 2019-01-11 DIAGNOSIS — F419 Anxiety disorder, unspecified: Secondary | ICD-10-CM

## 2019-01-11 DIAGNOSIS — R69 Illness, unspecified: Secondary | ICD-10-CM | POA: Diagnosis not present

## 2019-01-11 DIAGNOSIS — I10 Essential (primary) hypertension: Secondary | ICD-10-CM | POA: Diagnosis not present

## 2019-01-11 NOTE — Progress Notes (Signed)
Subjective:    Patient ID: Wendy Newman, female    DOB: 1941/07/27, 78 y.o.   MRN: [id number]  HPI Patient is a very pleasant 78 year old Caucasian female here today for a medical checkup.  She is on hydrochlorothiazide for hypertension.  Her blood pressure today is well controlled.  She denies any chest pain shortness of breath or dyspnea on exertion.  She is also taking Celexa 20 mg a day to "help calm her nerves".  She states without the medication, she gets upset and anxious very easily.  She has been on the medication for many years.  When she takes the medication, it helps prevent and control her anxiety.  She definitely feels that she benefits from the medication and she would like to continue taking it.  She denies any side effects from the medication.  Her mammogram was performed in December.  Her colonoscopy is well up-to-date.  Ministrations are up-to-date except for her tetanus shot which she politely declines today Past Medical History:  Diagnosis Date  . Anxiety   . Cataract   . Edema of both legs   . Essential hypertension   . Osteoporosis   . Vitamin D deficiency    Past Surgical History:  Procedure Laterality Date  . ABDOMINAL HYSTERECTOMY     Complete  . CESAREAN SECTION     X 3  . COLONOSCOPY  10/10/2008   FKC:[id number]  . COLONOSCOPY WITH PROPOFOL N/A 12/21/2014   Dr. Rourk:noncompliant left colon and redundant right colon, rectal and colonic mucosa appeared normal.    Current Outpatient Medications on File Prior to Visit  Medication Sig Dispense Refill  . cetirizine (ZYRTEC) 10 MG tablet Take 1 tablet (10 mg total) by mouth daily. As needed for allergy symptoms. 30 tablet 11  . cholecalciferol (VITAMIN D) 1000 UNITS tablet Take 1,000 Units by mouth daily.     . citalopram (CELEXA) 20 MG tablet Take 1 tablet (20 mg total) by mouth daily. 90 tablet 1  . hydrochlorothiazide (HYDRODIURIL) 25 MG tablet Take 1 tablet (25 mg total) by mouth daily. 90 tablet 0   No  current facility-administered medications on file prior to visit.    No Known Allergies Social History   Socioeconomic History  . Marital status: Married    Spouse name: Not on file  . Number of children: 3  . Years of education: Not on file  . Highest education level: Not on file  Occupational History  . 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 Financial resource strain: Not on file  . Food insecurity:    Worry: Not on file    Inability: Not on file  . Transportation needs:    Medical: Not on file    Non-medical: Not on file  Tobacco Use  . Smoking status: Never Smoker  . Smokeless tobacco: Never Used  Substance and Sexual Activity  . Alcohol use: No    Alcohol/week: 0.0 standard drinks  . Drug use: No  . Sexual activity: Yes    Birth control/protection: Surgical  Lifestyle  . Physical activity:    Days per week: Not on file    Minutes per session: Not on file  . Stress: Not on file  Relationships  . Social connections:    Talks on phone: Not on file    Gets together: Not on file    Attends religious service: Not on file    Active member of club or organization: Not on file  Attends meetings of clubs or organizations: Not on file    Relationship status: Not on file  . Intimate partner violence:    Fear of current or ex partner: Not on file    Emotionally abused: Not on file    Physically abused: Not on file    Forced sexual activity: Not on file  Other Topics Concern  . Not on file  Social History Narrative  . Not on file      Review of Systems  All other systems reviewed and are negative.      Objective:   Physical Exam Vitals signs reviewed.  Constitutional:      General: She is not in acute distress.    Appearance: Normal appearance. She is normal weight. She is not ill-appearing or toxic-appearing.  HENT:     Head: Normocephalic and atraumatic.  Neck:     Musculoskeletal: Neck supple.  Cardiovascular:     Rate and  Rhythm: Normal rate and regular rhythm.     Pulses: Normal pulses.     Heart sounds: Normal heart sounds. No murmur. No friction rub. No gallop.   Pulmonary:     Effort: Pulmonary effort is normal. No respiratory distress.     Breath sounds: Normal breath sounds. No stridor. No wheezing, rhonchi or rales.  Chest:     Chest wall: No tenderness.  Abdominal:     General: Abdomen is flat. Bowel sounds are normal. There is no distension.     Palpations: Abdomen is soft.     Tenderness: There is no abdominal tenderness.  Musculoskeletal:     Right lower leg: No edema.     Left lower leg: No edema.  Lymphadenopathy:     Cervical: No cervical adenopathy.  Neurological:     Mental Status: She is alert.           Assessment & Plan:  Essential hypertension - Plan: CBC with Differential/Platelet, COMPLETE METABOLIC PANEL WITH GFR, Lipid panel  Anxiety  She is blood pressure today is well controlled.  I will continue hydrochlorothiazide.  She is seeing definite benefit from Celexa.  I see no indication to discontinue the medication.  Therefore we will continue this as well.  Meanwhile we will check a patient with a CBC, CMP, and fasting lipid panel.  Her immunizations are up-to-date except for tetanus shot which she politely declines.  Mammogram and colonoscopy are up-to-date.

## 2019-01-12 LAB — CBC WITH DIFFERENTIAL/PLATELET
Absolute Monocytes: 554 cells/uL (ref 200–950)
BASOS ABS: 33 {cells}/uL (ref 0–200)
Basophils Relative: 0.5 %
EOS PCT: 1.2 %
Eosinophils Absolute: 79 cells/uL (ref 15–500)
HCT: 39.2 % (ref 35.0–45.0)
Hemoglobin: 13.2 g/dL (ref 11.7–15.5)
Lymphs Abs: 2396 cells/uL (ref 850–3900)
MCH: 30.8 pg (ref 27.0–33.0)
MCHC: 33.7 g/dL (ref 32.0–36.0)
MCV: 91.6 fL (ref 80.0–100.0)
MPV: 11.8 fL (ref 7.5–12.5)
Monocytes Relative: 8.4 %
NEUTROS ABS: 3538 {cells}/uL (ref 1500–7800)
Neutrophils Relative %: 53.6 %
Platelets: 251 10*3/uL (ref 140–400)
RBC: 4.28 10*6/uL (ref 3.80–5.10)
RDW: 13 % (ref 11.0–15.0)
Total Lymphocyte: 36.3 %
WBC: 6.6 10*3/uL (ref 3.8–10.8)

## 2019-01-12 LAB — COMPLETE METABOLIC PANEL WITH GFR
AG Ratio: 1.5 (calc) (ref 1.0–2.5)
ALT: 9 U/L (ref 6–29)
AST: 15 U/L (ref 10–35)
Albumin: 4 g/dL (ref 3.6–5.1)
Alkaline phosphatase (APISO): 45 U/L (ref 37–153)
BUN / CREAT RATIO: 19 (calc) (ref 6–22)
BUN: 19 mg/dL (ref 7–25)
CO2: 26 mmol/L (ref 20–32)
Calcium: 9.3 mg/dL (ref 8.6–10.4)
Chloride: 104 mmol/L (ref 98–110)
Creat: 0.99 mg/dL — ABNORMAL HIGH (ref 0.60–0.93)
GFR, Est African American: 65 mL/min/{1.73_m2} (ref >=60)
GFR, Est Non African American: 56 mL/min/{1.73_m2} — ABNORMAL LOW (ref >=60)
Globulin: 2.7 g/dL (calc) (ref 1.9–3.7)
Glucose, Bld: 95 mg/dL (ref 65–99)
Potassium: 5.2 mmol/L (ref 3.5–5.3)
Sodium: 140 mmol/L (ref 135–146)
Total Bilirubin: 0.7 mg/dL (ref 0.2–1.2)
Total Protein: 6.7 g/dL (ref 6.1–8.1)

## 2019-01-12 LAB — LIPID PANEL
Cholesterol: 201 mg/dL — ABNORMAL HIGH (ref <200)
HDL: 43 mg/dL — ABNORMAL LOW (ref >=50)
LDL Cholesterol (Calc): 136 mg/dL (calc) — ABNORMAL HIGH
NON-HDL CHOLESTEROL (CALC): 158 mg/dL — AB (ref <130)
Total CHOL/HDL Ratio: 4.7 (calc) (ref <5.0)
Triglycerides: 117 mg/dL (ref <150)

## 2019-04-05 ENCOUNTER — Other Ambulatory Visit: Payer: Self-pay | Admitting: Family Medicine

## 2019-04-07 ENCOUNTER — Ambulatory Visit (INDEPENDENT_AMBULATORY_CARE_PROVIDER_SITE_OTHER): Payer: Medicare HMO | Admitting: Family Medicine

## 2019-04-07 ENCOUNTER — Encounter: Payer: Self-pay | Admitting: Family Medicine

## 2019-04-07 ENCOUNTER — Other Ambulatory Visit: Payer: Self-pay

## 2019-04-07 VITALS — BP 142/74 | HR 67 | Temp 98.4°F | Resp 18 | Ht 66.0 in | Wt 180.0 lb

## 2019-04-07 DIAGNOSIS — H1131 Conjunctival hemorrhage, right eye: Secondary | ICD-10-CM

## 2019-04-07 DIAGNOSIS — S90822A Blister (nonthermal), left foot, initial encounter: Secondary | ICD-10-CM | POA: Diagnosis not present

## 2019-04-07 DIAGNOSIS — S90521A Blister (nonthermal), right ankle, initial encounter: Secondary | ICD-10-CM

## 2019-04-07 NOTE — Progress Notes (Signed)
Patient ID: Wendy BeadyBarbara S Cappuccio, female    DOB: Apr 29, 1941, 78 y.o.   MRN: [id number]  PCP: Donita BrooksPickard, Warren T, MD  Chief Complaint  Patient presents with  . Cyst    both feet  . Eye Problem    R eye redness, started 06/10    Subjective:   Wendy Newman is a 78 y.o. female, presents to clinic with CC of bump on both feet in between toes.  The bump on the right foot is between her great and second toe it began 3 to 4 days ago.  Yesterday a small bump on her left foot between her fourth and fifth toe developed.  They both feel like she is stepping on a pebble.  Her right foot the blister she is currently wearing flip-flops and it is a little bit tender to walk on with the strap right over the blister area.  No surrounding redness or swelling.  No other areas in her feet hands or body with any rash swelling, redness vesicles.  She denies any other symptoms of illness.  She does mention that the top of both of her feet over the dorsal distal metatarsals and MTP TP joints had been itchy and she has been applying hydrocortisone cream but it has not gotten better but there is no skin changes in that area.   She also has a spontaneous red right eye on the nasal aspect of her conjunctive a.  She asks if there is any drops can help it looked better.  She has no pain, no sensation of foreign body, no itching no drainage no visual disturbances and no swelling of her eyelids around her eye.  She denies any straining sneezing coughing that preceded the onset of the redness in her eye.   Patient Active Problem List   Diagnosis Date Noted  . Allergic rhinoconjunctivitis 07/02/2017  . Family history of colonic polyps 11/28/2014  . Family history of colon cancer 11/28/2014  . Constipation 11/28/2014  . Vitamin D deficiency 06/16/2013  . Hypertension   . Anxiety   . Osteoporosis   . Cataract   . Hyperlipemia 05/18/2009  . Anxiety state 05/18/2009  . NECK PAIN 05/18/2009  . ALLERGIC RHINITIS 08/28/2008   . OSTEOARTHRITIS 08/28/2008  . POSTMENOPAUSAL OSTEOPOROSIS 08/28/2008  . PAT 04/24/2008  . OTHER ABNORMAL BLOOD CHEMISTRY 04/24/2008  . Essential hypertension 12/16/2007  . HEADACHE 12/16/2007     Prior to Admission medications   Medication Sig Start Date End Date Taking? Authorizing Provider  cetirizine (ZYRTEC) 10 MG tablet Take 1 tablet (10 mg total) by mouth daily. As needed for allergy symptoms. 07/02/17  Yes Allayne Butcherixon, Mary B, PA-C  cholecalciferol (VITAMIN D) 1000 UNITS tablet Take 1,000 Units by mouth daily.    Yes [provider]  citalopram (CELEXA) 20 MG tablet Take 1 tablet (20 mg total) by mouth daily. 12/20/18  Yes Donita BrooksPickard, Warren T, MD  hydrochlorothiazide (HYDRODIURIL) 25 MG tablet TAKE 1 TABLET DAILY; NEEDS OFFICE VISIT 04/05/19  Yes Donita BrooksPickard, Warren T, MD     No Known Allergies   Family History  Problem Relation Age of Onset  . Cancer Mother [id number]       Breast. age 76.   . Colon polyps Mother   . Heart disease Father [id number]       CABG  . Cancer Sister [id number]       Lung  . Cancer Brother [id number]       Started in the back  .  Colon cancer Other        Age 3950s. maternal grandmother     Social History   Socioeconomic History  . Marital status: Married    Spouse name: Not on file  . Number of children: 3  . Years of education: Not on file  . Highest education level: Not on file  Occupational History  . 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 Financial resource strain: Not on file  . Food insecurity    Worry: Not on file    Inability: Not on file  . Transportation needs    Medical: Not on file    Non-medical: Not on file  Tobacco Use  . Smoking status: Never Smoker  . Smokeless tobacco: Never Used  Substance and Sexual Activity  . Alcohol use: No    Alcohol/week: 0.0 standard drinks  . Drug use: No  . Sexual activity: Yes    Birth control/protection: Surgical  Lifestyle  . Physical activity    Days per week: Not on file    Minutes per  session: Not on file  . Stress: Not on file  Relationships  . Social Musicianconnections    Talks on phone: Not on file    Gets together: Not on file    Attends religious service: Not on file    Active member of club or organization: Not on file    Attends meetings of clubs or organizations: Not on file    Relationship status: Not on file  . Intimate partner violence    Fear of current or ex partner: Not on file    Emotionally abused: Not on file    Physically abused: Not on file    Forced sexual activity: Not on file  Other Topics Concern  . Not on file  Social History Narrative  . Not on file     Review of Systems  Constitutional: Negative.   HENT: Negative.   Eyes: Negative.   Respiratory: Negative.   Cardiovascular: Negative.   Gastrointestinal: Negative.   Endocrine: Negative.   Genitourinary: Negative.   Musculoskeletal: Negative.   Skin: Negative.   Allergic/Immunologic: Negative.   Neurological: Negative.   Hematological: Negative.   Psychiatric/Behavioral: Negative.   All other systems reviewed and are negative.      Objective:    Vitals:   04/07/19 1424  BP: (!) 142/74  Pulse: 67  Resp: 18  Temp: 98.4 F (36.9 C)  SpO2: 96%  Weight: 180 lb (81.6 kg)  Height: 5\' 6"  (1.676 m)      Physical Exam Vitals signs and nursing note reviewed.  Constitutional:      Appearance: She is well-developed.  HENT:     Head: Normocephalic and atraumatic.     Nose: Nose normal.  Eyes:     General: Lids are normal. Gaze aligned appropriately. No allergic shiner or scleral icterus.       Right eye: No discharge or hordeolum.        Left eye: No discharge or hordeolum.     Extraocular Movements: Extraocular movements intact.     Conjunctiva/sclera:     Right eye: Right conjunctiva is not injected. Hemorrhage present. No chemosis or exudate.    Left eye: Left conjunctiva is not injected. No chemosis, exudate or hemorrhage. Neck:     Trachea: No tracheal deviation.   Cardiovascular:     Rate and Rhythm: Normal rate and regular rhythm.     Pulses:  Dorsalis pedis pulses are 2+ on the right side and 2+ on the left side.       Posterior tibial pulses are 2+ on the right side and 2+ on the left side.  Pulmonary:     Effort: Pulmonary effort is normal. No respiratory distress.     Breath sounds: No stridor.  Musculoskeletal: Normal range of motion.     Right foot: Normal range of motion. No deformity or bunion.     Left foot: Normal range of motion. No deformity or bunion.  Feet:     Right foot:     Skin integrity: Blister present. No ulcer, skin breakdown, erythema, warmth, callus, dry skin or fissure.     Left foot:     Skin integrity: Blister present. No ulcer, skin breakdown, erythema, warmth, callus, dry skin or fissure.     Comments: Right foot 1.5 cm circular blister located in interdigital space between right 1st and 2nd toes, intact and fluctuant  Left foot 1 cm circular blister located in interdigital space between right 4th and 5th toes, intact and fluctuant  Both areas non-tender to palpation No surrounding edema, erythema or induration  Skin:    General: Skin is warm and dry.     Findings: No rash.  Neurological:     Mental Status: She is alert.     Motor: No abnormal muscle tone.     Coordination: Coordination normal.  Psychiatric:        Behavior: Behavior normal.      PROCEDURE: Per pt's request she desired blisters on both feet drained - I reviewed wound care and risk for infection, also advised that we typically do not want to open up blisters, but she insisted and I agreed to aspirate and send off a sample of fluid to lab for testing. Both areas were prepped and draped with standard technique 22g needle introduced into central area of blister and fluid aspirated, also spontaneously drained.  < 0.5 mLs serous yellow fluid bilaterally  Base of both blistered through slightly opaque top layer of skin was noted to be darker  pink to red, and at the base before applying bandage, there was some serosanguineous drainage. Betadine was cleaned off of surrounding skin and bandage was applied and secured with paper tape. Patient tolerated without any pain or difficulty  This afternoon the lab technician is not in clinic, so wound culture him fluid will be obtained and placed in refrigerator to see if it can be ordered and run tomorrow.     Assessment & Plan:      ICD-10-CM   1. Blister of left foot, initial encounter  C58.527P   2. Blister of right ankle, initial encounter  S90.521A   3. Conjunctival hemorrhage of right eye  H11.31     Fluid from blisters aspirated - bandages applied, wound care thoroughly reviewed with pt.  Follow up PRN Possible viral vs fungal etiology?  Does not appear to have any cellulitis.  She had some itching to top of feet, but no rash or other signs of tinea pedis, but advised pt to tx with topical antifungal cream and powder and see if it helped with itching.  Advised not to wear any footwear with straps going between toes, keep bandages applied and open blisters covered.  Advised her to follow-up right away if she had any signs of infection.  Reviewed with her prior to doing thing on her feet, that she did not have any history of diabetes  or peripheral artery disease, history of MRSA or poor wound healing.  Patient also incidentally had a red eye the right nasal side which appeared to be a conjunctival hemorrhage without any known precipitating event or cause reviewed with her where conjunctival hemorrhage was and printed out a handout for both wound care and conjunctival hemorrhage information   Danelle BerryLeisa , PA-C 04/07/19 3:00 PM

## 2019-04-07 NOTE — Patient Instructions (Addendum)
For your itching on your feet I would add an antifungal to your cortisone.  Blisters and itching can be from fungus like athletes foot.  You can also use antifungal powder in shoes, etc.  Keep your feet and the blistered areas covered with bandages over the neext several days to 1-2 weeks while it heals to prevent chance of infection.  Do not wear flip flops and I would avoid things that go between your toes.  Follow up if any signs of infection (see below)  You eye - see info below, it should resolve on its own.   Subconjunctival Hemorrhage  Subconjunctival hemorrhage is bleeding that happens between the white part of your eye (sclera) and the clear membrane that covers the outside of your eye (conjunctiva). There are many tiny blood vessels near the surface of your eye. A subconjunctival hemorrhage happens when one or more of these vessels breaks and bleeds, causing a red patch to appear on your eye. This is similar to a bruise. Depending on the amount of bleeding, the red patch may only cover a small area of your eye or it may cover the entire visible part of the sclera. If a lot of blood collects under the conjunctiva, there may also be swelling. Subconjunctival hemorrhages do not affect your vision or cause pain, but your eye may feel irritated if there is swelling. Subconjunctival hemorrhages usually do not require treatment, and they disappear on their own within two weeks. What are the causes? This condition may be caused by:  Mild trauma, such as rubbing your eye too hard.  Severe trauma or blunt injuries.  Coughing, sneezing, or vomiting.  Straining, such as when lifting a heavy object.  High blood pressure.  Recent eye surgery.  A history of diabetes.  Certain medicines, especially blood thinners (anticoagulants).  Other conditions, such as eye tumors, bleeding disorders, or blood vessel abnormalities. Subconjunctival hemorrhages can happen without an obvious cause. What  are the signs or symptoms? Symptoms of this condition include:  A bright red or dark red patch on the white part of the eye. ? The red area may spread out to cover a larger area of the eye before it goes away. ? The red area may turn brownish-yellow before it goes away.  Swelling.  Mild eye irritation. How is this diagnosed? This condition is diagnosed with a physical exam. If your subconjunctival hemorrhage was caused by trauma, your health care provider may refer you to an eye specialist (ophthalmologist) or another specialist to check for other injuries. You may have other tests, including:  An eye exam.  A blood pressure check.  Blood tests to check for bleeding disorders. If your subconjunctival hemorrhage was caused by trauma, X-rays or a CT scan may be done to check for other injuries. How is this treated? Usually, no treatment is needed. Your health care provider may recommend eye drops or cold compresses to help with discomfort. Follow these instructions at home:  Take over-the-counter and prescription medicines only as directed by your health care provider.  Use eye drops or cold compresses to help with discomfort as directed by your health care provider.  Avoid activities, things, and environments that may irritate or injure your eye.  Keep all follow-up visits as told by your health care provider. This is important. Contact a health care provider if:  You have pain in your eye.  The bleeding does not go away within 3 weeks.  You keep getting new subconjunctival hemorrhages. Get  help right away if:  Your vision changes or you have difficulty seeing.  You suddenly develop severe sensitivity to light.  You develop a severe headache, persistent vomiting, confusion, or abnormal tiredness (lethargy).  Your eye seems to bulge or protrude from your eye socket.  You develop unexplained bruises on your body.  You have unexplained bleeding in another area of your  body. This information is not intended to replace advice given to you by your health care provider. Make sure you discuss any questions you have with your health care provider. Document Released: 10/13/2005 Document Revised: 06/14/2018 Document Reviewed: 12/20/2014 Elsevier Interactive Patient Education  2019 [employer]    Blisters, Adult  A blister is a raised bubble of skin filled with liquid. Blisters often develop in an area of the skin that repeatedly rubs or presses against another surface (friction blister). Friction blisters can occur on any part of the body, but they usually develop on the hands or feet. Long-term pressure on the same area of the skin can also lead to areas of hardened skin (calluses). What are the causes? A blister can be caused by:  An injury.  A burn.  An allergic reaction.  An infection.  Exposure to irritating chemicals.  Friction, especially in an area with a lot of heat and moisture. Friction blisters often result from:  Sports.  Repetitive activities.  Using tools and doing other activities without wearing gloves.  Shoes that are too tight or too loose. What are the signs or symptoms? A blister is often round and looks like a bump. It may:  Itch.  Be painful to the touch. Before a blister forms, the skin may:  Become red.  Feel warm.  Itch.  Be painful to the touch. How is this diagnosed? A blister is diagnosed with a physical exam. How is this treated? Treatment usually involves protecting the area where the blister has formed until the skin has healed. Other treatments may include:  A bandage (dressing) to cover the blister.  Extra padding around and over the blister, so that it does not rub on anything.  Antibiotic ointment. Most blisters break open, dry up, and go away on their own within 1-2 weeks. Blisters that are very painful may be drained before they break open on their own. If the blister is large or painful,  it can be drained by: 1. Sterilizing a small needle with rubbing alcohol. 2. Washing your hands with soap and water. 3. Inserting the needle in the edge of the blister to make a small hole. Some fluid will drain out of the hole. Let the top or roof of the blister stay in place. This helps the skin heal. 4. Washing the blister with mild soap and water. 5. Covering the blister with antibiotic ointment, if prescribed by your health care provider, and a dressing. Some blisters may need to be drained by a health care provider. Follow these instructions at home:  Protect the area where the blister has formed as told by your health care provider.  Keep your blister clean and dry. This helps to prevent infection.  Do not pop your blister. This can cause infection.  If you were prescribed an antibiotic, use it as told by your health care provider. Do not stop using the antibiotic even if your condition improves.  Wear different shoes until the blister heals.  Avoid the activity that caused the blister until your blister heals.  Check your blister every day for signs of  infection. Check for: ? More redness, swelling, or pain. ? More fluid or blood. ? Warmth. ? Pus or a bad smell. ? The blister getting better and then getting worse. How is this prevented? Taking these steps can help to prevent blisters that are caused by friction. Make sure you:  Wear comfortable shoes that fit well.  Always wear socks with shoes.  Wear extra socks or use tape, bandages, or pads over blister-prone areas as needed. You may also apply petroleum jelly under bandages in blister-prone areas.  Wear protective gear, such as gloves, when participating in sports or activities that can cause blisters.  Wear loose-fitting, moisture-wicking clothes when participating in sports or activities.  Use powders as needed to keep your feet dry. Contact a health care provider if:  You have more redness, swelling, or pain  around your blister.  You have more fluid or blood coming from your blister.  Your blister feels warm to the touch.  You have pus or a bad smell coming from your blister.  You have a fever or chills.  Your blister gets better and then it gets worse. This information is not intended to replace advice given to you by your health care provider. Make sure you discuss any questions you have with your health care provider. Document Released: 11/20/2004 Document Revised: 06/11/2016 Document Reviewed: 04/25/2016 Elsevier Interactive Patient Education  2019 [employer].

## 2019-05-05 DIAGNOSIS — Z0100 Encounter for examination of eyes and vision without abnormal findings: Secondary | ICD-10-CM | POA: Diagnosis not present

## 2019-07-15 ENCOUNTER — Ambulatory Visit (INDEPENDENT_AMBULATORY_CARE_PROVIDER_SITE_OTHER): Payer: Medicare HMO | Admitting: Family Medicine

## 2019-07-15 ENCOUNTER — Other Ambulatory Visit: Payer: Self-pay

## 2019-07-15 ENCOUNTER — Encounter: Payer: Self-pay | Admitting: Family Medicine

## 2019-07-15 VITALS — BP 122/70 | HR 70 | Temp 98.9°F | Resp 16 | Ht 66.0 in | Wt 177.0 lb

## 2019-07-15 DIAGNOSIS — I872 Venous insufficiency (chronic) (peripheral): Secondary | ICD-10-CM | POA: Diagnosis not present

## 2019-07-15 DIAGNOSIS — Z23 Encounter for immunization: Secondary | ICD-10-CM

## 2019-07-15 DIAGNOSIS — I10 Essential (primary) hypertension: Secondary | ICD-10-CM

## 2019-07-15 DIAGNOSIS — F4321 Adjustment disorder with depressed mood: Secondary | ICD-10-CM | POA: Diagnosis not present

## 2019-07-15 DIAGNOSIS — R69 Illness, unspecified: Secondary | ICD-10-CM | POA: Diagnosis not present

## 2019-07-15 MED ORDER — ALPRAZOLAM 0.5 MG PO TABS: 0.5000 mg | ORAL_TABLET | Freq: Three times a day (TID) | ORAL | 0 refills | Status: DC | PRN

## 2019-07-15 NOTE — Progress Notes (Signed)
Subjective:    Patient ID: Wendy Newman, female    DOB: [birth year], 78 y.o.   MRN: [id number]  HPI  3/20 Patient is a very pleasant 78 year old Caucasian female here today for a medical checkup.  She is on hydrochlorothiazide for hypertension.  Her blood pressure today is well controlled.  She denies any chest pain shortness of breath or dyspnea on exertion.  She is also taking Celexa 20 mg a day to "help calm her nerves".  She states without the medication, she gets upset and anxious very easily.  She has been on the medication for many years.  When she takes the medication, it helps prevent and control her anxiety.  She definitely feels that she benefits from the medication and she would like to continue taking it.  She denies any side effects from the medication.  Her mammogram was performed in December.  Her colonoscopy is well up-to-date.  Ministrations are up-to-date except for her tetanus shot which she politely declines today.  At that time, my plan was: She is blood pressure today is well controlled.  I will continue hydrochlorothiazide.  She is seeing definite benefit from Celexa.  I see no indication to discontinue the medication.  Therefore we will continue this as well.  Meanwhile we will check a patient with a CBC, CMP, and fasting lipid panel.  Her immunizations are up-to-date except for tetanus shot which she politely declines.  Mammogram and colonoscopy are up-to-date.  07/15/19 Unfortunately, the patient's husband, Wendy Newman, died 1 month ago.  He has cirrhosis of the liver and what appear to be cancer.  Patient was failing to thrive and was admitted to the hospital with nausea and vomiting and then developed acute respiratory failure with pneumonia.  Mrs. Wendy Newman is certainly grieving.  She states that she has frequent crying spells throughout the day.  At times she feels overcome with grief.  She also lies in bed at night unable to stop her mind from thinking about loss and sadness.   She denies any suicidal ideation.  However she would be interested in medication to try to help with the stress and anxiety of the grieving process.  She is still taking Celexa which previously had worked well for her.  Her other concern is swelling in her legs.  Particular around her ankles and her feet she has +1 to trace pitting edema with numerous small varicose veins.  Varicose veins run in her family particular her father had vein stripping surgery.  She is taking hydrochlorothiazide.  She is not wearing any compression hose.  She denies any chest pain shortness of breath or dyspnea on exertion. Past Medical History:  Diagnosis Date  . Anxiety   . Cataract   . Edema of both legs   . Essential hypertension   . Osteoporosis   . Vitamin D deficiency    Past Surgical History:  Procedure Laterality Date  . ABDOMINAL HYSTERECTOMY     Complete  . CESAREAN SECTION     X 3  . COLONOSCOPY  10/10/2008   [id number]:incomplete  . COLONOSCOPY WITH PROPOFOL N/A 12/21/2014   Dr. Rourk:noncompliant left colon and redundant right colon, rectal and colonic mucosa appeared normal.    Current Outpatient Medications on File Prior to Visit  Medication Sig Dispense Refill  . cetirizine (ZYRTEC) 10 MG tablet Take 1 tablet (10 mg total) by mouth daily. As needed for allergy symptoms. 30 tablet 11  . cholecalciferol (VITAMIN D) 1000 UNITS tablet Take 1,000  Units by mouth daily.     . citalopram (CELEXA) 20 MG tablet Take 1 tablet (20 mg total) by mouth daily. 90 tablet 1  . hydrochlorothiazide (HYDRODIURIL) 25 MG tablet TAKE 1 TABLET DAILY; NEEDS OFFICE VISIT 90 tablet 0   No current facility-administered medications on file prior to visit.    No Known Allergies Social History   Socioeconomic History  . Marital status: Married    Spouse name: Not on file  . Number of children: 3  . Years of education: Not on file  . Highest education level: Not on file  Occupational History  . 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 Financial resource strain: Not on file  . Food insecurity    Worry: Not on file    Inability: Not on file  . Transportation needs    Medical: Not on file    Non-medical: Not on file  Tobacco Use  . Smoking status: Never Smoker  . Smokeless tobacco: Never Used  Substance and Sexual Activity  . Alcohol use: No    Alcohol/week: 0.0 standard drinks  . Drug use: No  . Sexual activity: Yes    Birth control/protection: Surgical  Lifestyle  . Physical activity    Days per week: Not on file    Minutes per session: Not on file  . Stress: Not on file  Relationships  . Social Musician on phone: Not on file    Gets together: Not on file    Attends religious service: Not on file    Active member of club or organization: Not on file    Attends meetings of clubs or organizations: Not on file    Relationship status: Not on file  . Intimate partner violence    Fear of current or ex partner: Not on file    Emotionally abused: Not on file    Physically abused: Not on file    Forced sexual activity: Not on file  Other Topics Concern  . Not on file  Social History Narrative  . Not on file      Review of Systems  All other systems reviewed and are negative.      Objective:   Physical Exam Vitals signs reviewed.  Constitutional:      General: She is not in acute distress.    Appearance: Normal appearance. She is normal weight. She is not ill-appearing or toxic-appearing.  HENT:     Head: Normocephalic and atraumatic.  Neck:     Musculoskeletal: Neck supple.  Cardiovascular:     Rate and Rhythm: Normal rate and regular rhythm.     Pulses: Normal pulses.     Heart sounds: Normal heart sounds. No murmur. No friction rub. No gallop.   Pulmonary:     Effort: Pulmonary effort is normal. No respiratory distress.     Breath sounds: Normal breath sounds. No stridor. No wheezing, rhonchi or rales.  Chest:     Chest wall: No tenderness.   Abdominal:     General: Abdomen is flat. Bowel sounds are normal. There is no distension.     Palpations: Abdomen is soft.     Tenderness: There is no abdominal tenderness.  Musculoskeletal:     Right lower leg: No edema.     Left lower leg: No edema.  Lymphadenopathy:     Cervical: No cervical adenopathy.  Neurological:     Mental Status: She is alert.  Assessment & Plan:  Needs flu shot - Plan: Flu Vaccine QUAD High Dose(Fluad)  Grief reaction  Benign essential HTN  Venous insufficiency  Patient's blood pressure today is well controlled.  I offered her my condolences.  I will give the patient Xanax 0.5 mg 1 p.o. every 8 hours as needed anxiety or grief.  She can return fasting at any time for a fasting lipid panel, CBC, CMP.  I recommended that she seek grief counseling through hospice as I believe this would be beneficial for her.  Patient received her flu shot today.  I believe the swelling in her ankles is due to chronic venous insufficiency.  The swelling is mild.  Therefore I recommended knee-high compression hose to help manage.

## 2019-08-15 ENCOUNTER — Other Ambulatory Visit: Payer: Self-pay

## 2019-08-15 MED ORDER — HYDROCHLOROTHIAZIDE 25 MG PO TABS: ORAL_TABLET | ORAL | 1 refills | Status: DC

## 2019-08-15 MED ORDER — CITALOPRAM HYDROBROMIDE 20 MG PO TABS: 20.0000 mg | ORAL_TABLET | Freq: Every day | ORAL | 1 refills | Status: DC

## 2019-10-24 ENCOUNTER — Other Ambulatory Visit (HOSPITAL_COMMUNITY): Payer: Self-pay | Admitting: Family Medicine

## 2019-10-24 DIAGNOSIS — Z1231 Encounter for screening mammogram for malignant neoplasm of breast: Secondary | ICD-10-CM

## 2019-10-26 ENCOUNTER — Ambulatory Visit (HOSPITAL_COMMUNITY): Payer: Medicare HMO

## 2019-11-04 ENCOUNTER — Ambulatory Visit (HOSPITAL_COMMUNITY): Payer: Medicare HMO

## 2019-11-07 ENCOUNTER — Ambulatory Visit (HOSPITAL_COMMUNITY)
Admission: RE | Admit: 2019-11-07 | Discharge: 2019-11-07 | Disposition: A | Discharge status: Home / Self Care | Payer: Medicare HMO | Source: Ambulatory Visit | Attending: Family Medicine | Admitting: Family Medicine

## 2019-11-07 ENCOUNTER — Other Ambulatory Visit: Payer: Self-pay

## 2019-11-07 DIAGNOSIS — Z1231 Encounter for screening mammogram for malignant neoplasm of breast: Secondary | ICD-10-CM | POA: Diagnosis not present

## 2019-11-18 ENCOUNTER — Encounter: Payer: Self-pay | Admitting: Family Medicine

## 2019-11-18 ENCOUNTER — Other Ambulatory Visit: Payer: Self-pay

## 2019-11-18 ENCOUNTER — Ambulatory Visit (INDEPENDENT_AMBULATORY_CARE_PROVIDER_SITE_OTHER): Payer: Medicare HMO | Admitting: Family Medicine

## 2019-11-18 VITALS — BP 140/80 | HR 64 | Temp 97.1°F | Resp 18 | Ht 68.0 in | Wt 174.0 lb

## 2019-11-18 DIAGNOSIS — S39012A Strain of muscle, fascia and tendon of lower back, initial encounter: Secondary | ICD-10-CM | POA: Diagnosis not present

## 2019-11-18 MED ORDER — CYCLOBENZAPRINE HCL 10 MG PO TABS: 10.0000 mg | ORAL_TABLET | Freq: Three times a day (TID) | ORAL | 0 refills | Status: DC | PRN

## 2019-11-18 MED ORDER — MELOXICAM 15 MG PO TABS: 15.0000 mg | ORAL_TABLET | Freq: Every day | ORAL | 0 refills | Status: DC

## 2019-11-18 NOTE — Progress Notes (Signed)
Subjective:    Patient ID: Wendy Newman, female    DOB: 09-18-1941, 79 y.o.   MRN: [id number]  HPI  Patient reports a 1 week history of pain in her lower back.  The pain is located in her left lower back roughly at the level of L5 slightly lateral to the spine.  It aches and throbs in that area.  She has a positive contralateral straight leg raise but a negative ipsilateral straight leg raise.  She has decreased forward flexion.  She has pain with flexion and extension of the back.  Muscle strength is 5/5 equal and symmetric in the lower extremities with normal reflexes and normal sensation.  She does have some mild tenderness to palpation in the left lateral paraspinal musculature.  There is no palpable muscle spasms however there is muscle stiffness and tenderness.  There is no rash or erythema or swelling in that area.  She denies any specific injury.  The pain began gradually. Past Medical History:  Diagnosis Date  . Anxiety   . Cataract   . Edema of both legs   . Essential hypertension   . Osteoporosis   . Vitamin D deficiency    Past Surgical History:  Procedure Laterality Date  . ABDOMINAL HYSTERECTOMY     Complete  . CESAREAN SECTION     X 3  . COLONOSCOPY  10/10/2008   [id number]  . COLONOSCOPY WITH PROPOFOL N/A 12/21/2014   Dr. Rourk:noncompliant left colon and redundant right colon, rectal and colonic mucosa appeared normal.    Current Outpatient Medications on File Prior to Visit  Medication Sig Dispense Refill  . ALPRAZolam (XANAX) 0.5 MG tablet Take 1 tablet (0.5 mg total) by mouth 3 (three) times daily as needed for anxiety. 30 tablet 0  . cholecalciferol (VITAMIN D) 1000 UNITS tablet Take 1,000 Units by mouth daily.     . citalopram (CELEXA) 20 MG tablet Take 1 tablet (20 mg total) by mouth daily. 90 tablet 1  . hydrochlorothiazide (HYDRODIURIL) 25 MG tablet TAKE 1 TABLET DAILY; NEEDS OFFICE VISIT 90 tablet 1   No current facility-administered medications on  file prior to visit.   No Known Allergies Social History   Socioeconomic History  . Marital status: Married    Spouse name: Not on file  . Number of children: 3  . Years of education: Not on file  . Highest education level: Not on file  Occupational History  . 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 Smoking status: Never Smoker  . Smokeless tobacco: Never Used  Substance and Sexual Activity  . Alcohol use: No    Alcohol/week: 0.0 standard drinks  . Drug use: No  . Sexual activity: Yes    Birth control/protection: Surgical  Other Topics Concern  . Not on file  Social History Narrative  . Not on file   Social Determinants of Health   Financial Resource Strain:   . Difficulty of Paying Living Expenses: Not on file  Food Insecurity:   . Worried About Charity fundraiser in the Last Year: Not on file  . Ran Out of Food in the Last Year: Not on file  Transportation Needs:   . Lack of Transportation (Medical): Not on file  . Lack of Transportation (Non-Medical): Not on file  Physical Activity:   . Days of Exercise per Week: Not on file  . Minutes of Exercise per Session: Not on file  Stress:   . Feeling  of Stress : Not on file  Social Connections:   . Frequency of Communication with Friends and Family: Not on file  . Frequency of Social Gatherings with Friends and Family: Not on file  . Attends Religious Services: Not on file  . Active Member of Clubs or Organizations: Not on file  . Attends Banker Meetings: Not on file  . Marital Status: Not on file  Intimate Partner Violence:   . Fear of Current or Ex-Partner: Not on file  . Emotionally Abused: Not on file  . Physically Abused: Not on file  . Sexually Abused: Not on file     Review of Systems  All other systems reviewed and are negative.      Objective:   Physical Exam Constitutional:      Appearance: Normal appearance. She is normal weight.  Cardiovascular:     Rate and  Rhythm: Normal rate and regular rhythm.     Heart sounds: Normal heart sounds.  Pulmonary:     Effort: Pulmonary effort is normal.     Breath sounds: Normal breath sounds.  Musculoskeletal:     Lumbar back: Tenderness present. No swelling, deformity, lacerations, spasms or bony tenderness. Decreased range of motion. Positive left straight leg raise test. Negative right straight leg raise test.  Neurological:     Mental Status: She is alert.           Assessment & Plan:  Low back strain, initial encounter  I believe the patient strained a muscle in her lower back.  She can use meloxicam 15 mg every day for inflammation.  Start Flexeril 5 to 10 mg every 8 hours as needed for muscle spasms.  Advised the patient to be careful taking the medication due to sedation.  Pain should gradually improve over the next week as the muscle heals.  Recheck in 1 week if no better or sooner if worsening.

## 2019-11-22 ENCOUNTER — Telehealth: Payer: Self-pay | Admitting: Family Medicine

## 2019-11-22 MED ORDER — CYCLOBENZAPRINE HCL 10 MG PO TABS: 10.0000 mg | ORAL_TABLET | Freq: Three times a day (TID) | ORAL | 0 refills | Status: DC | PRN

## 2019-11-22 NOTE — Telephone Encounter (Signed)
PA Submitted through [email or web address] and received the following:  Approved today  Your request has been approved.  Pharm aware  

## 2019-12-07 ENCOUNTER — Telehealth: Payer: Self-pay | Admitting: Family Medicine

## 2019-12-07 NOTE — Telephone Encounter (Signed)
Patient left vm asking if it is okay for her to take bio-complete 3 with her blood pressure medication.  CB# [phone]

## 2019-12-08 MED ORDER — HYDROCHLOROTHIAZIDE 25 MG PO TABS: ORAL_TABLET | ORAL | 3 refills | Status: DC

## 2019-12-08 MED ORDER — CITALOPRAM HYDROBROMIDE 20 MG PO TABS: 20.0000 mg | ORAL_TABLET | Freq: Every day | ORAL | 3 refills | Status: DC

## 2019-12-08 NOTE — Telephone Encounter (Signed)
Should be fine.

## 2019-12-09 NOTE — Telephone Encounter (Signed)
Tried to call - no answer and vm has not been set up 

## 2019-12-11 ENCOUNTER — Other Ambulatory Visit: Payer: Self-pay | Admitting: Family Medicine

## 2019-12-25 ENCOUNTER — Ambulatory Visit: Payer: Medicare HMO

## 2019-12-30 ENCOUNTER — Other Ambulatory Visit: Payer: Self-pay

## 2019-12-30 ENCOUNTER — Encounter: Payer: Self-pay | Admitting: Family Medicine

## 2019-12-30 ENCOUNTER — Ambulatory Visit (INDEPENDENT_AMBULATORY_CARE_PROVIDER_SITE_OTHER): Payer: Medicare HMO | Admitting: Family Medicine

## 2019-12-30 VITALS — BP 138/74 | HR 68 | Temp 97.0°F | Resp 14 | Ht 68.0 in | Wt 175.0 lb

## 2019-12-30 DIAGNOSIS — I10 Essential (primary) hypertension: Secondary | ICD-10-CM | POA: Diagnosis not present

## 2019-12-30 DIAGNOSIS — E785 Hyperlipidemia, unspecified: Secondary | ICD-10-CM | POA: Diagnosis not present

## 2019-12-30 DIAGNOSIS — E559 Vitamin D deficiency, unspecified: Secondary | ICD-10-CM | POA: Diagnosis not present

## 2019-12-30 DIAGNOSIS — J309 Allergic rhinitis, unspecified: Secondary | ICD-10-CM

## 2019-12-30 DIAGNOSIS — H1010 Acute atopic conjunctivitis, unspecified eye: Secondary | ICD-10-CM

## 2019-12-30 MED ORDER — FLUTICASONE PROPIONATE 50 MCG/ACT NA SUSP: 2.0000 | Freq: Every day | NASAL | 6 refills | Status: DC

## 2019-12-30 MED ORDER — LEVOCETIRIZINE DIHYDROCHLORIDE 5 MG PO TABS: 5.0000 mg | ORAL_TABLET | Freq: Every evening | ORAL | 0 refills | Status: DC

## 2019-12-30 NOTE — Progress Notes (Signed)
Subjective:    Patient ID: Wendy Newman, female    DOB: [birth year], 79 y.o.   MRN: [id number]  HPI Patient reports a history of runny nose, itchy eyes, sinus pressure, dull headache, and rhinorrhea.  She reports postnasal drip and a nonproductive cough due to postnasal drip.  She has not tried any allergy medication or decongestants for this.  She denies any fevers or chills or shortness of breath or chest pain Past Medical History:  Diagnosis Date  . Anxiety   . Cataract   . Edema of both legs   . Essential hypertension   . Osteoporosis   . Vitamin D deficiency    Past Surgical History:  Procedure Laterality Date  . ABDOMINAL HYSTERECTOMY     Complete  . CESAREAN SECTION     X 3  . COLONOSCOPY  10/10/2008   [id number]  . COLONOSCOPY WITH PROPOFOL N/A 12/21/2014   Dr. Rourk:noncompliant left colon and redundant right colon, rectal and colonic mucosa appeared normal.    Current Outpatient Medications on File Prior to Visit  Medication Sig Dispense Refill  . ALPRAZolam (XANAX) 0.5 MG tablet Take 1 tablet (0.5 mg total) by mouth 3 (three) times daily as needed for anxiety. 30 tablet 0  . cholecalciferol (VITAMIN D) 1000 UNITS tablet Take 1,000 Units by mouth daily.     . citalopram (CELEXA) 20 MG tablet Take 1 tablet (20 mg total) by mouth daily. 90 tablet 3  . cyclobenzaprine (FLEXERIL) 10 MG tablet Take 1 tablet (10 mg total) by mouth 3 (three) times daily as needed for muscle spasms. 30 tablet 0  . hydrochlorothiazide (HYDRODIURIL) 25 MG tablet TAKE 1 TABLET DAILY 90 tablet 3  . meloxicam (MOBIC) 15 MG tablet TAKE 1 TABLET BY MOUTH EVERY DAY 30 tablet 5   No current facility-administered medications on file prior to visit.   No Known Allergies Social History   Socioeconomic History  . Marital status: Married    Spouse name: Not on file  . Number of children: 3  . Years of education: Not on file  . Highest education level: Not on file  Occupational History  .  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 Smoking status: Never Smoker  . Smokeless tobacco: Never Used  Substance and Sexual Activity  . Alcohol use: No    Alcohol/week: 0.0 standard drinks  . Drug use: No  . Sexual activity: Yes    Birth control/protection: Surgical  Other Topics Concern  . Not on file  Social History Narrative  . Not on file   Social Determinants of Health   Financial Resource Strain:   . Difficulty of Paying Living Expenses: Not on file  Food Insecurity:   . Worried About Programme researcher, broadcasting/film/video in the Last Year: Not on file  . Ran Out of Food in the Last Year: Not on file  Transportation Needs:   . Lack of Transportation (Medical): Not on file  . Lack of Transportation (Non-Medical): Not on file  Physical Activity:   . Days of Exercise per Week: Not on file  . Minutes of Exercise per Session: Not on file  Stress:   . Feeling of Stress : Not on file  Social Connections:   . Frequency of Communication with Friends and Family: Not on file  . Frequency of Social Gatherings with Friends and Family: Not on file  . Attends Religious Services: Not on file  . Active Member of Clubs or  Organizations: Not on file  . Attends Archivist Meetings: Not on file  . Marital Status: Not on file  Intimate Partner Violence:   . Fear of Current or Ex-Partner: Not on file  . Emotionally Abused: Not on file  . Physically Abused: Not on file  . Sexually Abused: Not on file      Review of Systems  All other systems reviewed and are negative.      Objective:   Physical Exam Vitals reviewed.  Constitutional:      General: She is not in acute distress.    Appearance: Normal appearance. She is normal weight. She is not ill-appearing or toxic-appearing.  HENT:     Head: Normocephalic and atraumatic.     Right Ear: Tympanic membrane and ear canal normal.     Left Ear: Tympanic membrane and ear canal normal.     Nose: Congestion and rhinorrhea  present.  Cardiovascular:     Rate and Rhythm: Normal rate and regular rhythm.     Pulses: Normal pulses.     Heart sounds: Normal heart sounds. No murmur. No friction rub. No gallop.   Pulmonary:     Effort: Pulmonary effort is normal. No respiratory distress.     Breath sounds: Normal breath sounds. No stridor. No wheezing, rhonchi or rales.  Chest:     Chest wall: No tenderness.  Musculoskeletal:     Cervical back: Neck supple.  Lymphadenopathy:     Cervical: No cervical adenopathy.  Neurological:     Mental Status: She is alert.           Assessment & Plan:  Hyperlipidemia, unspecified hyperlipidemia type - Plan: CBC with Differential/Platelet, Comprehensive metabolic panel, Lipid panel  Essential hypertension - Plan: CBC with Differential/Platelet, Comprehensive metabolic panel, Lipid panel  Vitamin D deficiency  Allergic rhinoconjunctivitis  I do not believe the patient is having a sinus infection.  Instead I recommended that she start Xyzal 5 mg a day coupled with Flonase 2 sprays each nostril daily and reassess in 1 week if no better or sooner if worse.  We also obtain lab work for her upcoming physical exam.

## 2019-12-31 LAB — LIPID PANEL
Cholesterol: 193 mg/dL (ref <200)
HDL: 42 mg/dL — ABNORMAL LOW (ref >=50)
LDL Cholesterol (Calc): 129 mg/dL (calc) — ABNORMAL HIGH
Non-HDL Cholesterol (Calc): 151 mg/dL (calc) — ABNORMAL HIGH (ref <130)
Total CHOL/HDL Ratio: 4.6 (calc) (ref <5.0)
Triglycerides: 111 mg/dL (ref <150)

## 2019-12-31 LAB — COMPREHENSIVE METABOLIC PANEL
AG Ratio: 1.5 (calc) (ref 1.0–2.5)
ALT: 9 U/L (ref 6–29)
AST: 15 U/L (ref 10–35)
Albumin: 4 g/dL (ref 3.6–5.1)
Alkaline phosphatase (APISO): 50 U/L (ref 37–153)
BUN: 19 mg/dL (ref 7–25)
CO2: 27 mmol/L (ref 20–32)
Calcium: 9.3 mg/dL (ref 8.6–10.4)
Chloride: 105 mmol/L (ref 98–110)
Creat: 0.87 mg/dL (ref 0.60–0.93)
Globulin: 2.7 g/dL (calc) (ref 1.9–3.7)
Glucose, Bld: 96 mg/dL (ref 65–99)
Potassium: 4.3 mmol/L (ref 3.5–5.3)
Sodium: 140 mmol/L (ref 135–146)
Total Bilirubin: 0.6 mg/dL (ref 0.2–1.2)
Total Protein: 6.7 g/dL (ref 6.1–8.1)

## 2019-12-31 LAB — CBC WITH DIFFERENTIAL/PLATELET
Absolute Monocytes: 503 cells/uL (ref 200–950)
Basophils Absolute: 27 cells/uL (ref 0–200)
Basophils Relative: 0.4 %
Eosinophils Absolute: 80 cells/uL (ref 15–500)
Eosinophils Relative: 1.2 %
HCT: 39.2 % (ref 35.0–45.0)
Hemoglobin: 13.1 g/dL (ref 11.7–15.5)
Lymphs Abs: 2399 cells/uL (ref 850–3900)
MCH: 30.8 pg (ref 27.0–33.0)
MCHC: 33.4 g/dL (ref 32.0–36.0)
MCV: 92 fL (ref 80.0–100.0)
MPV: 11.4 fL (ref 7.5–12.5)
Monocytes Relative: 7.5 %
Neutro Abs: 3692 cells/uL (ref 1500–7800)
Neutrophils Relative %: 55.1 %
Platelets: 241 10*3/uL (ref 140–400)
RBC: 4.26 10*6/uL (ref 3.80–5.10)
RDW: 12.7 % (ref 11.0–15.0)
Total Lymphocyte: 35.8 %
WBC: 6.7 10*3/uL (ref 3.8–10.8)

## 2020-01-02 ENCOUNTER — Ambulatory Visit: Payer: Medicare HMO | Admitting: Family Medicine

## 2020-01-21 ENCOUNTER — Other Ambulatory Visit: Payer: Self-pay | Admitting: Family Medicine

## 2020-06-24 ENCOUNTER — Ambulatory Visit
Admission: EM | Admit: 2020-06-24 | Discharge: 2020-06-24 | Disposition: A | Discharge status: Home / Self Care | Payer: Medicare HMO | Attending: Emergency Medicine | Admitting: Emergency Medicine

## 2020-06-24 ENCOUNTER — Encounter: Payer: Self-pay | Admitting: Emergency Medicine

## 2020-06-24 DIAGNOSIS — M7989 Other specified soft tissue disorders: Secondary | ICD-10-CM

## 2020-06-24 DIAGNOSIS — M79644 Pain in right finger(s): Secondary | ICD-10-CM

## 2020-06-24 NOTE — ED Triage Notes (Signed)
Pt closed her RT middle finger in a rat trap x2 weeks ago. Still having pain and a knot to the area.

## 2020-06-24 NOTE — ED Provider Notes (Signed)
[facility]   [id number] 06/24/20 Arrival Time: 1030   Chief Complaint  Patient presents with  . Finger Injury     SUBJECTIVE: History from: patient.  Wendy Newman is a 79 y.o. female who presented to the urgent care for complaint of right middle finger pain and swelling for the past 2 weeks.  Nephritis finger was jammed in rmouse trap.  She localizes the pain to the right middle finger.  She describes the pain as constant and achy.  She has tried OTC medications without relief.  Her symptoms are made worse with ROM.  She denies similar symptoms in the past.  Denies chills, fever, nausea, vomiting, diarrhea   ROS: As per HPI.  All other pertinent ROS negative.      Past Medical History:  Diagnosis Date  . Anxiety   . Cataract   . Edema of both legs   . Essential hypertension   . Osteoporosis   . Vitamin D deficiency    Past Surgical History:  Procedure Laterality Date  . ABDOMINAL HYSTERECTOMY     Complete  . CESAREAN SECTION     X 3  . COLONOSCOPY  10/10/2008   [id number]  . COLONOSCOPY WITH PROPOFOL N/A 12/21/2014   Dr. Rourk:noncompliant left colon and redundant right colon, rectal and colonic mucosa appeared normal.    No Known Allergies No current facility-administered medications on file prior to encounter.   Current Outpatient Medications on File Prior to Encounter  Medication Sig Dispense Refill  . ALPRAZolam (XANAX) 0.5 MG tablet Take 1 tablet (0.5 mg total) by mouth 3 (three) times daily as needed for anxiety. 30 tablet 0  . cholecalciferol (VITAMIN D) 1000 UNITS tablet Take 1,000 Units by mouth daily.     . citalopram (CELEXA) 20 MG tablet Take 1 tablet (20 mg total) by mouth daily. 90 tablet 3  . cyclobenzaprine (FLEXERIL) 10 MG tablet Take 1 tablet (10 mg total) by mouth 3 (three) times daily as needed for muscle spasms. 30 tablet 0  . fluticasone (FLONASE) 50 MCG/ACT nasal spray Place 2 sprays into both nostrils daily. 16 g 6  .  hydrochlorothiazide (HYDRODIURIL) 25 MG tablet TAKE 1 TABLET DAILY 90 tablet 3  . levocetirizine (XYZAL) 5 MG tablet TAKE 1 TABLET BY MOUTH EVERY DAY IN THE EVENING 30 tablet 3  . meloxicam (MOBIC) 15 MG tablet TAKE 1 TABLET BY MOUTH EVERY DAY 30 tablet 5   Social History   Socioeconomic History  . Marital status: Married    Spouse name: Not on file  . Number of children: 3  . Years of education: Not on file  . Highest education level: Not on file  Occupational History  . 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 Smoking status: Never Smoker  . Smokeless tobacco: Never Used  Substance and Sexual Activity  . Alcohol use: No    Alcohol/week: 0.0 standard drinks  . Drug use: No  . Sexual activity: Yes    Birth control/protection: Surgical  Other Topics Concern  . Not on file  Social History Narrative  . Not on file   Social Determinants of Health   Financial Resource Strain:   . Difficulty of Paying Living Expenses: Not on file  Food Insecurity:   . Worried About Programme researcher, broadcasting/film/video in the Last Year: Not on file  . Ran Out of Food in the Last Year: Not on file  Transportation Needs:   . Lack of  Transportation (Medical): Not on file  . Lack of Transportation (Non-Medical): Not on file  Physical Activity:   . Days of Exercise per Week: Not on file  . Minutes of Exercise per Session: Not on file  Stress:   . Feeling of Stress : Not on file  Social Connections:   . Frequency of Communication with Friends and Family: Not on file  . Frequency of Social Gatherings with Friends and Family: Not on file  . Attends Religious Services: Not on file  . Active Member of Clubs or Organizations: Not on file  . Attends Banker Meetings: Not on file  . Marital Status: Not on file  Intimate Partner Violence:   . Fear of Current or Ex-Partner: Not on file  . Emotionally Abused: Not on file  . Physically Abused: Not on file  . Sexually Abused: Not on file    Family History  Problem Relation Age of Onset  . Cancer Mother 25       Breast. age 101.   . Colon polyps Mother   . Heart disease Father 65       CABG  . Cancer Sister 89       Lung  . Cancer Brother 16       Started in the back  . Colon cancer Other        Age 66s. maternal grandmother    OBJECTIVE:  Vitals:   06/24/20 1042  BP: (!) 157/78  Pulse: 76  Resp: 16  Temp: 98.8 F (37.1 C)  TempSrc: Oral  SpO2: 97%     Physical Exam Vitals and nursing note reviewed.  Constitutional:      General: She is not in acute distress.    Appearance: Normal appearance. She is normal weight. She is not ill-appearing, toxic-appearing or diaphoretic.  HENT:     Head: Normocephalic.  Cardiovascular:     Rate and Rhythm: Normal rate and regular rhythm.     Pulses: Normal pulses.     Heart sounds: Normal heart sounds. No murmur heard.  No friction rub. No gallop.   Pulmonary:     Effort: Pulmonary effort is normal. No respiratory distress.     Breath sounds: Normal breath sounds. No stridor. No wheezing, rhonchi or rales.  Chest:     Chest wall: No tenderness.  Musculoskeletal:        General: Tenderness present.     Right hand: Swelling present.     Left hand: Normal.     Comments: The right hand is with obvious deformity compared to the left hand.  Swelling present.  No erythema, atrophy, obvious deformity, she first developed an open wound, nail avulsion, tissue avulsion, partial complete amputation, subungual hematoma, or bony deformity normal cascade of fingers.  Limited extension and flexion due to pain neurovascular status intact.  Neurological:     Mental Status: She is alert and oriented to person, place, and time.      LABS:  No results found for this or any previous visit (from the past 24 hour(s)).   ASSESSMENT & PLAN:  1. Pain of right middle finger   2. Finger swelling     No orders of the defined types were placed in this encounter.   Discharge  Instructions X-ray was ordered.  Have it completed within a week.  We will call you if your result is abnormal. May take OTC Tylenol/ibuprofen as needed for pain Follow RICE instruction that is attached Follow-up with PCP  Return or go to ED for worsening of symptoms  Reviewed expectations re: course of current medical issues. Questions answered. Outlined signs and symptoms indicating need for more acute intervention. Patient verbalized understanding. After Visit Summary given.      Note: This document was prepared using Dragon voice recognition software and may include unintentional dictation errors.    Durward Parcel, FNP 06/24/20 1128

## 2020-06-24 NOTE — Discharge Instructions (Addendum)
X-ray was ordered.  Have it completed within a week.  We will call you if your result is abnormal. May take OTC Tylenol/ibuprofen as needed for pain Follow RICE instruction that is attached Follow-up with PCP Return or go to ED for worsening of symptoms

## 2020-07-10 ENCOUNTER — Other Ambulatory Visit: Payer: Self-pay

## 2020-07-10 ENCOUNTER — Encounter: Payer: Self-pay | Admitting: Family Medicine

## 2020-07-10 ENCOUNTER — Ambulatory Visit (INDEPENDENT_AMBULATORY_CARE_PROVIDER_SITE_OTHER): Payer: Medicare HMO | Admitting: Family Medicine

## 2020-07-10 VITALS — BP 130/74 | HR 70 | Resp 16 | Ht 66.0 in | Wt 175.0 lb

## 2020-07-10 DIAGNOSIS — Z780 Asymptomatic menopausal state: Secondary | ICD-10-CM

## 2020-07-10 DIAGNOSIS — I872 Venous insufficiency (chronic) (peripheral): Secondary | ICD-10-CM

## 2020-07-10 DIAGNOSIS — Z0001 Encounter for general adult medical examination with abnormal findings: Secondary | ICD-10-CM | POA: Diagnosis not present

## 2020-07-10 DIAGNOSIS — F3341 Major depressive disorder, recurrent, in partial remission: Secondary | ICD-10-CM

## 2020-07-10 DIAGNOSIS — Z23 Encounter for immunization: Secondary | ICD-10-CM

## 2020-07-10 DIAGNOSIS — R69 Illness, unspecified: Secondary | ICD-10-CM | POA: Diagnosis not present

## 2020-07-10 DIAGNOSIS — I10 Essential (primary) hypertension: Secondary | ICD-10-CM | POA: Diagnosis not present

## 2020-07-10 DIAGNOSIS — Z0000 Encounter for general adult medical examination without abnormal findings: Secondary | ICD-10-CM

## 2020-07-10 LAB — COMPLETE METABOLIC PANEL WITH GFR
AG Ratio: 1.4 (calc) (ref 1.0–2.5)
ALT: 37 U/L — ABNORMAL HIGH (ref 6–29)
AST: 25 U/L (ref 10–35)
Albumin: 4.3 g/dL (ref 3.6–5.1)
Alkaline phosphatase (APISO): 63 U/L (ref 37–153)
BUN/Creatinine Ratio: 15 (calc) (ref 6–22)
BUN: 15 mg/dL (ref 7–25)
CO2: 26 mmol/L (ref 20–32)
Calcium: 9.7 mg/dL (ref 8.6–10.4)
Chloride: 100 mmol/L (ref 98–110)
Creat: 1.01 mg/dL — ABNORMAL HIGH (ref 0.60–0.93)
GFR, Est African American: 63 mL/min/{1.73_m2} (ref >=60)
GFR, Est Non African American: 54 mL/min/{1.73_m2} — ABNORMAL LOW (ref >=60)
Globulin: 3 g/dL (calc) (ref 1.9–3.7)
Glucose, Bld: 90 mg/dL (ref 65–99)
Potassium: 3.8 mmol/L (ref 3.5–5.3)
Sodium: 139 mmol/L (ref 135–146)
Total Bilirubin: 0.7 mg/dL (ref 0.2–1.2)
Total Protein: 7.3 g/dL (ref 6.1–8.1)

## 2020-07-10 LAB — CBC WITH DIFFERENTIAL/PLATELET
Absolute Monocytes: 936 cells/uL (ref 200–950)
Basophils Absolute: 29 cells/uL (ref 0–200)
Basophils Relative: 0.4 %
Eosinophils Absolute: 43 cells/uL (ref 15–500)
Eosinophils Relative: 0.6 %
HCT: 40.8 % (ref 35.0–45.0)
Hemoglobin: 13.3 g/dL (ref 11.7–15.5)
Lymphs Abs: 2758 cells/uL (ref 850–3900)
MCH: 29.9 pg (ref 27.0–33.0)
MCHC: 32.6 g/dL (ref 32.0–36.0)
MCV: 91.7 fL (ref 80.0–100.0)
MPV: 10.8 fL (ref 7.5–12.5)
Monocytes Relative: 13 %
Neutro Abs: 3434 cells/uL (ref 1500–7800)
Neutrophils Relative %: 47.7 %
Platelets: 311 10*3/uL (ref 140–400)
RBC: 4.45 10*6/uL (ref 3.80–5.10)
RDW: 12.9 % (ref 11.0–15.0)
Total Lymphocyte: 38.3 %
WBC: 7.2 10*3/uL (ref 3.8–10.8)

## 2020-07-10 LAB — LIPID PANEL
Cholesterol: 209 mg/dL — ABNORMAL HIGH (ref <200)
HDL: 36 mg/dL — ABNORMAL LOW (ref >=50)
LDL Cholesterol (Calc): 140 mg/dL (calc) — ABNORMAL HIGH
Non-HDL Cholesterol (Calc): 173 mg/dL (calc) — ABNORMAL HIGH (ref <130)
Total CHOL/HDL Ratio: 5.8 (calc) — ABNORMAL HIGH (ref <5.0)
Triglycerides: 190 mg/dL — ABNORMAL HIGH (ref <150)

## 2020-07-10 MED ORDER — AMOXICILLIN 875 MG PO TABS: 875.0000 mg | ORAL_TABLET | Freq: Two times a day (BID) | ORAL | 0 refills | Status: DC

## 2020-07-10 NOTE — Progress Notes (Signed)
Subjective:    Patient ID: Wendy Newman, female    DOB: 1941-05-11, 79 y.o.   MRN: [id number]  HPI Patient is a very pleasant 79 year old Caucasian female here today for a medical checkup.  She is on hydrochlorothiazide for hypertension.  Her blood pressure today is well controlled.  She denies any chest pain shortness of breath or dyspnea on exertion.  She is also taking Celexa 20 mg a day to "help calm her nerves".  She states without the medication, she gets upset and anxious very easily.  She has been on the medication for many years.  When she takes the medication, it helps prevent and control her anxiety.  She definitely feels that she benefits from the medication and she would like to continue taking it.  She denies any side effects from the medication.    Patient's mammogram was 1/21 and was normal.  Her last colonoscopy was in 2016 and was normal.  She is not due again until 2026.  She has a history of a hysterectomy and therefore does not require a Pap smear.  She also reportedly has a history of osteoporosis.  She has not had a bone density test since 2013 and that is overdue.  She has had Pneumovax 23.  She has had Prevnar 13.  She is due for the shingles vaccine.  She is due for a booster on her Covid vaccine coming up in December.  She is also due for a flu shot today.  She denies any falls, depression, or memory loss  Past Medical History:  Diagnosis Date  . Anxiety   . Cataract   . Edema of both legs   . Essential hypertension   . Osteoporosis   . Vitamin D deficiency    Past Surgical History:  Procedure Laterality Date  . ABDOMINAL HYSTERECTOMY     Complete  . CESAREAN SECTION     X 3  . COLONOSCOPY  10/10/2008   [id number]  . COLONOSCOPY WITH PROPOFOL N/A 12/21/2014   Dr. Rourk:noncompliant left colon and redundant right colon, rectal and colonic mucosa appeared normal.    Current Outpatient Medications on File Prior to Visit  Medication Sig Dispense Refill    . cholecalciferol (VITAMIN D) 1000 UNITS tablet Take 1,000 Units by mouth daily.     . citalopram (CELEXA) 20 MG tablet Take 1 tablet (20 mg total) by mouth daily. 90 tablet 3  . fluticasone (FLONASE) 50 MCG/ACT nasal spray Place 2 sprays into both nostrils daily. 16 g 6  . hydrochlorothiazide (HYDRODIURIL) 25 MG tablet TAKE 1 TABLET DAILY 90 tablet 3  . ALPRAZolam (XANAX) 0.5 MG tablet Take 1 tablet (0.5 mg total) by mouth 3 (three) times daily as needed for anxiety. (Patient not taking: Reported on 07/10/2020) 30 tablet 0  . cyclobenzaprine (FLEXERIL) 10 MG tablet Take 1 tablet (10 mg total) by mouth 3 (three) times daily as needed for muscle spasms. (Patient not taking: Reported on 07/10/2020) 30 tablet 0  . levocetirizine (XYZAL) 5 MG tablet TAKE 1 TABLET BY MOUTH EVERY DAY IN THE EVENING (Patient not taking: Reported on 07/10/2020) 30 tablet 3  . meloxicam (MOBIC) 15 MG tablet TAKE 1 TABLET BY MOUTH EVERY DAY (Patient not taking: Reported on 07/10/2020) 30 tablet 5   No current facility-administered medications on file prior to visit.   No Known Allergies Social History   Socioeconomic History  . Marital status: Married    Spouse name: Not on file  . Number  of children: 3  . Years of education: Not on file  . Highest education level: Not on file  Occupational History  . 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 Smoking status: Never Smoker  . Smokeless tobacco: Never Used  Substance and Sexual Activity  . Alcohol use: No    Alcohol/week: 0.0 standard drinks  . Drug use: No  . Sexual activity: Yes    Birth control/protection: Surgical  Other Topics Concern  . Not on file  Social History Narrative  . Not on file   Social Determinants of Health   Financial Resource Strain:   . Difficulty of Paying Living Expenses: Not on file  Food Insecurity:   . Worried About Programme researcher, broadcasting/film/video in the Last Year: Not on file  . Ran Out of Food in the Last Year: Not on file   Transportation Needs:   . Lack of Transportation (Medical): Not on file  . Lack of Transportation (Non-Medical): Not on file  Physical Activity:   . Days of Exercise per Week: Not on file  . Minutes of Exercise per Session: Not on file  Stress:   . Feeling of Stress : Not on file  Social Connections:   . Frequency of Communication with Friends and Family: Not on file  . Frequency of Social Gatherings with Friends and Family: Not on file  . Attends Religious Services: Not on file  . Active Member of Clubs or Organizations: Not on file  . Attends Banker Meetings: Not on file  . Marital Status: Not on file  Intimate Partner Violence:   . Fear of Current or Ex-Partner: Not on file  . Emotionally Abused: Not on file  . Physically Abused: Not on file  . Sexually Abused: Not on file      Review of Systems  All other systems reviewed and are negative.      Objective:   Physical Exam Vitals reviewed.  Constitutional:      General: She is not in acute distress.    Appearance: Normal appearance. She is normal weight. She is not ill-appearing or toxic-appearing.  HENT:     Head: Normocephalic and atraumatic.  Cardiovascular:     Rate and Rhythm: Normal rate and regular rhythm.     Pulses: Normal pulses.     Heart sounds: Normal heart sounds. No murmur heard.  No friction rub. No gallop.   Pulmonary:     Effort: Pulmonary effort is normal. No respiratory distress.     Breath sounds: Normal breath sounds. No stridor. No wheezing, rhonchi or rales.  Chest:     Chest wall: No tenderness.  Abdominal:     General: Abdomen is flat. Bowel sounds are normal. There is no distension.     Palpations: Abdomen is soft.     Tenderness: There is no abdominal tenderness.  Musculoskeletal:     Cervical back: Neck supple.     Right lower leg: No edema.     Left lower leg: No edema.  Lymphadenopathy:     Cervical: No cervical adenopathy.  Neurological:     Mental Status: She  is alert.           Assessment & Plan:  Postmenopausal estrogen deficiency - Plan: DG Bone Density  Benign essential HTN - Plan: CBC with Differential/Platelet, COMPLETE METABOLIC PANEL WITH GFR, Lipid panel  Venous insufficiency  General medical exam  MDD (major depressive disorder), recurrent, in partial remission (HCC)  Blood pressure today is outstanding.  I will check a CBC, CMP, fasting lipid panel.  Mammogram is up-to-date.  Colonoscopy is up-to-date.  Pap smear is not due.  Her depression right now is adequately controlled on citalopram.  She continues to deal with the grief of losing her husband to pancreatic cancer but overall she seems to be doing very well.  I did recommend she receive the flu shot today.  Also recommended the shingles vaccine.  Covid vaccination, Pneumovax 23, and Prevnar 13 are all up-to-date.  She denies any falls.  She denies any memory loss.  Regular anticipatory guidance is provided

## 2020-07-20 ENCOUNTER — Other Ambulatory Visit: Payer: Self-pay | Admitting: Emergency Medicine

## 2020-07-27 ENCOUNTER — Other Ambulatory Visit: Payer: Self-pay

## 2020-07-27 DIAGNOSIS — E7800 Pure hypercholesterolemia, unspecified: Secondary | ICD-10-CM

## 2020-07-27 MED ORDER — ATORVASTATIN CALCIUM 20 MG PO TABS: 20.0000 mg | ORAL_TABLET | Freq: Every day | ORAL | 3 refills | Status: DC

## 2020-07-31 DIAGNOSIS — H40003 Preglaucoma, unspecified, bilateral: Secondary | ICD-10-CM | POA: Diagnosis not present

## 2020-09-26 ENCOUNTER — Telehealth: Payer: Self-pay

## 2020-09-26 NOTE — Telephone Encounter (Signed)
Pt called needing her Providers office to call her insurance Wendy Newman for providers services in order to receive transportation.

## 2020-11-05 ENCOUNTER — Other Ambulatory Visit: Payer: Self-pay

## 2020-11-07 ENCOUNTER — Other Ambulatory Visit: Payer: Self-pay

## 2020-11-08 ENCOUNTER — Other Ambulatory Visit: Payer: Self-pay | Admitting: Family Medicine

## 2020-11-08 DIAGNOSIS — Z1231 Encounter for screening mammogram for malignant neoplasm of breast: Secondary | ICD-10-CM

## 2020-11-15 ENCOUNTER — Ambulatory Visit (HOSPITAL_COMMUNITY)
Admission: RE | Admit: 2020-11-15 | Discharge: 2020-11-15 | Disposition: A | Discharge status: Home / Self Care | Payer: Medicare HMO | Source: Ambulatory Visit | Attending: Family Medicine | Admitting: Family Medicine

## 2020-11-15 ENCOUNTER — Other Ambulatory Visit: Payer: Self-pay

## 2020-11-15 DIAGNOSIS — Z8739 Personal history of other diseases of the musculoskeletal system and connective tissue: Secondary | ICD-10-CM | POA: Diagnosis not present

## 2020-11-15 DIAGNOSIS — Z780 Asymptomatic menopausal state: Secondary | ICD-10-CM

## 2020-11-15 DIAGNOSIS — Z1231 Encounter for screening mammogram for malignant neoplasm of breast: Secondary | ICD-10-CM | POA: Insufficient documentation

## 2020-11-19 ENCOUNTER — Encounter: Payer: Self-pay | Admitting: *Deleted

## 2020-12-14 ENCOUNTER — Encounter: Payer: Self-pay | Admitting: Family Medicine

## 2020-12-14 ENCOUNTER — Ambulatory Visit (INDEPENDENT_AMBULATORY_CARE_PROVIDER_SITE_OTHER): Payer: Medicare HMO | Admitting: Family Medicine

## 2020-12-14 ENCOUNTER — Other Ambulatory Visit: Payer: Self-pay

## 2020-12-14 VITALS — BP 128/72 | HR 62 | Temp 99.0°F | Resp 14 | Ht 66.0 in | Wt 181.0 lb

## 2020-12-14 DIAGNOSIS — E7800 Pure hypercholesterolemia, unspecified: Secondary | ICD-10-CM

## 2020-12-14 MED ORDER — MELOXICAM 15 MG PO TABS: 15.0000 mg | ORAL_TABLET | Freq: Every day | ORAL | 5 refills | Status: DC

## 2020-12-14 MED ORDER — FLUTICASONE PROPIONATE 50 MCG/ACT NA SUSP: 2.0000 | Freq: Every day | NASAL | 6 refills | Status: DC

## 2020-12-14 MED ORDER — LEVOCETIRIZINE DIHYDROCHLORIDE 5 MG PO TABS: ORAL_TABLET | ORAL | 3 refills | Status: DC

## 2020-12-14 NOTE — Progress Notes (Signed)
Subjective:    Patient ID: Wendy Newman, female    DOB: 25-May-1941, 80 y.o.   MRN: [id number]  HPI Patient is a very pleasant 80 year old Caucasian female who presents today to recheck her cholesterol.  At her last visit, her LDL cholesterol was elevated and we recommended taking Lipitor.  She denies any chest pain shortness of breath or dyspnea on exertion.  She denies any myalgias or right upper quadrant pain.  She is on hydrochlorothiazide for hypertension and her blood pressure is excellent today.  She had her mammogram in January that was normal.  Her bone density test shows osteoporosis however it continues to improve.  It has steadily improved every year since 2009.  She is on calcium and vitamin D have not taken a bisphosphonate for more than 5 years.  Together we decided to keep checking her bone density every 2 years and as long as it is improving not revisit a bisphosphonate Past Medical History:  Diagnosis Date  . Anxiety   . Cataract   . Edema of both legs   . Essential hypertension   . Osteoporosis   . Vitamin D deficiency    Past Surgical History:  Procedure Laterality Date  . ABDOMINAL HYSTERECTOMY     Complete  . CESAREAN SECTION     X 3  . COLONOSCOPY  10/10/2008   [id number]  . COLONOSCOPY WITH PROPOFOL N/A 12/21/2014   Dr. Rourk:noncompliant left colon and redundant right colon, rectal and colonic mucosa appeared normal.    Current Outpatient Medications on File Prior to Visit  Medication Sig Dispense Refill  . atorvastatin (LIPITOR) 20 MG tablet Take 1 tablet (20 mg total) by mouth daily. 90 tablet 3  . cholecalciferol (VITAMIN D) 1000 UNITS tablet Take 1,000 Units by mouth daily.     . citalopram (CELEXA) 20 MG tablet Take 1 tablet (20 mg total) by mouth daily. 90 tablet 3  . hydrochlorothiazide (HYDRODIURIL) 25 MG tablet TAKE 1 TABLET DAILY 90 tablet 3   No current facility-administered medications on file prior to visit.   No Known Allergies Social  History   Socioeconomic History  . Marital status: Married    Spouse name: Not on file  . Number of children: 3  . Years of education: Not on file  . Highest education level: Not on file  Occupational History  . 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 Smoking status: Never Smoker  . Smokeless tobacco: Never Used  Substance and Sexual Activity  . Alcohol use: No    Alcohol/week: 0.0 standard drinks  . Drug use: No  . Sexual activity: Yes    Birth control/protection: Surgical  Other Topics Concern  . Not on file  Social History Narrative  . Not on file   Social Determinants of Health   Financial Resource Strain: Not on file  Food Insecurity: Not on file  Transportation Needs: Not on file  Physical Activity: Not on file  Stress: Not on file  Social Connections: Not on file  Intimate Partner Violence: Not on file      Review of Systems  All other systems reviewed and are negative.      Objective:   Physical Exam Vitals reviewed.  Constitutional:      General: She is not in acute distress.    Appearance: Normal appearance. She is normal weight. She is not ill-appearing or toxic-appearing.  HENT:     Head: Normocephalic and atraumatic.  Right Ear: Tympanic membrane and ear canal normal.     Left Ear: Tympanic membrane and ear canal normal.     Nose: Congestion and rhinorrhea present.  Cardiovascular:     Rate and Rhythm: Normal rate and regular rhythm.     Pulses: Normal pulses.     Heart sounds: Normal heart sounds. No murmur heard. No friction rub. No gallop.   Pulmonary:     Effort: Pulmonary effort is normal. No respiratory distress.     Breath sounds: Normal breath sounds. No stridor. No wheezing, rhonchi or rales.  Chest:     Chest wall: No tenderness.  Musculoskeletal:     Cervical back: Neck supple.  Lymphadenopathy:     Cervical: No cervical adenopathy.  Neurological:     Mental Status: She is alert.            Assessment & Plan:  Pure hypercholesterolemia - Plan: COMPLETE METABOLIC PANEL WITH GFR, Lipid panel Essential hypertension, osteoporosis  Blood pressure is excellent.  Check CMP and fasting lipid panel.  Goal LDL cholesterol is less than 130.  Mammogram, bone density test, and colonoscopy are up-to-date. I do not believe the patient is having a sinus infection.  Instead I recommended that she start Xyzal 5 mg a day coupled with Flonase 2 sprays each nostril daily and reassess in 1 week if no better or sooner if worse.  We also obtain lab work for her upcoming physical exam.

## 2020-12-15 LAB — COMPLETE METABOLIC PANEL WITH GFR
AG Ratio: 1.6 (calc) (ref 1.0–2.5)
ALT: 10 U/L (ref 6–29)
AST: 14 U/L (ref 10–35)
Albumin: 4.3 g/dL (ref 3.6–5.1)
Alkaline phosphatase (APISO): 57 U/L (ref 37–153)
BUN/Creatinine Ratio: 18 (calc) (ref 6–22)
BUN: 17 mg/dL (ref 7–25)
CO2: 29 mmol/L (ref 20–32)
Calcium: 9.3 mg/dL (ref 8.6–10.4)
Chloride: 103 mmol/L (ref 98–110)
Creat: 0.94 mg/dL — ABNORMAL HIGH (ref 0.60–0.93)
GFR, Est African American: 68 mL/min/{1.73_m2} (ref >=60)
GFR, Est Non African American: 59 mL/min/{1.73_m2} — ABNORMAL LOW (ref >=60)
Globulin: 2.7 g/dL (calc) (ref 1.9–3.7)
Glucose, Bld: 95 mg/dL (ref 65–99)
Potassium: 4.1 mmol/L (ref 3.5–5.3)
Sodium: 141 mmol/L (ref 135–146)
Total Bilirubin: 0.6 mg/dL (ref 0.2–1.2)
Total Protein: 7 g/dL (ref 6.1–8.1)

## 2020-12-15 LAB — LIPID PANEL
Cholesterol: 140 mg/dL (ref <200)
HDL: 49 mg/dL — ABNORMAL LOW (ref >=50)
LDL Cholesterol (Calc): 73 mg/dL (calc)
Non-HDL Cholesterol (Calc): 91 mg/dL (calc) (ref <130)
Total CHOL/HDL Ratio: 2.9 (calc) (ref <5.0)
Triglycerides: 96 mg/dL (ref <150)

## 2020-12-19 NOTE — Progress Notes (Signed)
Pt number is dc

## 2021-01-08 ENCOUNTER — Ambulatory Visit: Payer: Medicare HMO | Admitting: Family Medicine

## 2021-01-22 ENCOUNTER — Other Ambulatory Visit: Payer: Self-pay | Admitting: Family Medicine

## 2021-02-01 ENCOUNTER — Other Ambulatory Visit: Payer: Self-pay | Admitting: Family Medicine

## 2021-02-08 ENCOUNTER — Telehealth: Payer: Self-pay | Admitting: Family Medicine

## 2021-02-08 NOTE — Progress Notes (Signed)
  Chronic Care Management   Outreach Note  02/08/2021 Name: Wendy Newman MRN: [id number] DOB: [birth year]  Referred by: Donita Brooks, MD Reason for referral : No chief complaint on file.   An unsuccessful telephone outreach was attempted today. The patient was referred to the pharmacist for assistance with care management and care coordination.   Follow Up Plan:   Carley Perdue [employer] Scheduler

## 2021-02-15 ENCOUNTER — Telehealth: Payer: Self-pay | Admitting: Family Medicine

## 2021-02-15 NOTE — Progress Notes (Signed)
  Chronic Care Management   Outreach Note  02/15/2021 Name: Wendy Newman MRN: [id number] DOB: November 12, 1940  Referred by: Donita Brooks, MD Reason for referral : No chief complaint on file.   An unsuccessful telephone outreach was attempted today. The patient was referred to the pharmacist for assistance with care management and care coordination.   Follow Up Plan:   Carley Perdue [employer] Scheduler

## 2021-03-06 ENCOUNTER — Telehealth: Payer: Self-pay | Admitting: Family Medicine

## 2021-03-06 NOTE — Progress Notes (Signed)
  Chronic Care Management   Outreach Note  03/06/2021 Name: TAMULA MORRICAL MRN: [id number] DOB: [birth year]  Referred by: Donita Brooks, MD Reason for referral : No chief complaint on file.   Third unsuccessful telephone outreach was attempted today. The patient was referred to the pharmacist for assistance with care management and care coordination.   Follow Up Plan:   Carley Perdue [employer] Scheduler

## 2021-03-26 ENCOUNTER — Encounter: Payer: Self-pay | Admitting: Family Medicine

## 2021-03-26 ENCOUNTER — Ambulatory Visit (INDEPENDENT_AMBULATORY_CARE_PROVIDER_SITE_OTHER): Payer: Medicare HMO | Admitting: Family Medicine

## 2021-03-26 ENCOUNTER — Other Ambulatory Visit: Payer: Self-pay

## 2021-03-26 VITALS — BP 138/64 | HR 66 | Temp 98.6°F | Resp 16 | Ht 66.0 in | Wt 183.0 lb

## 2021-03-26 DIAGNOSIS — H02401 Unspecified ptosis of right eyelid: Secondary | ICD-10-CM | POA: Diagnosis not present

## 2021-03-26 DIAGNOSIS — E7800 Pure hypercholesterolemia, unspecified: Secondary | ICD-10-CM | POA: Diagnosis not present

## 2021-03-26 DIAGNOSIS — I10 Essential (primary) hypertension: Secondary | ICD-10-CM

## 2021-03-26 DIAGNOSIS — M810 Age-related osteoporosis without current pathological fracture: Secondary | ICD-10-CM

## 2021-03-26 MED ORDER — ALENDRONATE SODIUM 70 MG PO TABS: 70.0000 mg | ORAL_TABLET | ORAL | 11 refills | Status: DC

## 2021-03-26 MED ORDER — MELOXICAM 15 MG PO TABS: 15.0000 mg | ORAL_TABLET | Freq: Every day | ORAL | 5 refills | Status: DC

## 2021-03-26 NOTE — Progress Notes (Signed)
Subjective:    Patient ID: Wendy Newman, female    DOB: 1940/12/19, 80 y.o.   MRN: [id number]  HPI Patient is requesting a referral to see a plastic surgeon.  She has chronic ptosis of the right upper eyelid.  This does not wax and wane.  It does not progressively get worse as the day occurs.  She denies any diplopia or other symptoms of myasthenia gravis.  Instead she is always had a sag in her right upper eyelid for as long as she can remember.  Upon close inspection, the entire pupil is visible in the left.  Only three quarters of the pupil is visible in the right.  Patient states that this "aggravates her".  Otherwise however she has no problems from it.  Her blood pressure today is well controlled.  She denies any chest pain shortness of breath or dyspnea on exertion.  We reviewed her lab work from February which was excellent.  She did have a bone density test in January of this year that showed osteoporosis.  Patient states that she has been on Fosamax for more than 10 years.  At the present time she is only taking calcium and vitamin D Past Medical History:  Diagnosis Date  . Anxiety   . Cataract   . Edema of both legs   . Essential hypertension   . Osteoporosis   . Vitamin D deficiency    Past Surgical History:  Procedure Laterality Date  . ABDOMINAL HYSTERECTOMY     Complete  . CESAREAN SECTION     X 3  . COLONOSCOPY  10/10/2008   [id number]  . COLONOSCOPY WITH PROPOFOL N/A 12/21/2014   Dr. Rourk:noncompliant left colon and redundant right colon, rectal and colonic mucosa appeared normal.    Current Outpatient Medications on File Prior to Visit  Medication Sig Dispense Refill  . atorvastatin (LIPITOR) 20 MG tablet Take 1 tablet (20 mg total) by mouth daily. 90 tablet 3  . cholecalciferol (VITAMIN D) 1000 UNITS tablet Take 1,000 Units by mouth daily.     . citalopram (CELEXA) 20 MG tablet TAKE 1 TABLET DAILY 90 tablet 3  . fluticasone (FLONASE) 50 MCG/ACT nasal spray  Place 2 sprays into both nostrils daily. 16 g 6  . hydrochlorothiazide (HYDRODIURIL) 25 MG tablet TAKE 1 TABLET DAILY 90 tablet 3  . levocetirizine (XYZAL) 5 MG tablet TAKE 1 TABLET BY MOUTH EVERY DAY IN THE EVENING 30 tablet 3  . meloxicam (MOBIC) 15 MG tablet Take 1 tablet (15 mg total) by mouth daily. 30 tablet 5   No current facility-administered medications on file prior to visit.   No Known Allergies Social History   Socioeconomic History  . Marital status: Married    Spouse name: Not on file  . Number of children: 3  . Years of education: Not on file  . Highest education level: Not on file  Occupational History  . 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 Smoking status: Never Smoker  . Smokeless tobacco: Never Used  Substance and Sexual Activity  . Alcohol use: No    Alcohol/week: 0.0 standard drinks  . Drug use: No  . Sexual activity: Yes    Birth control/protection: Surgical  Other Topics Concern  . Not on file  Social History Narrative  . Not on file   Social Determinants of Health   Financial Resource Strain: Not on file  Food Insecurity: Not on file  Transportation Needs: Not on  file  Physical Activity: Not on file  Stress: Not on file  Social Connections: Not on file  Intimate Partner Violence: Not on file      Review of Systems  All other systems reviewed and are negative.      Objective:   Physical Exam Vitals reviewed.  Constitutional:      General: She is not in acute distress.    Appearance: Normal appearance. She is normal weight. She is not ill-appearing or toxic-appearing.  HENT:     Head: Normocephalic and atraumatic.     Nose: No congestion or rhinorrhea.  Cardiovascular:     Rate and Rhythm: Normal rate and regular rhythm.     Pulses: Normal pulses.     Heart sounds: Normal heart sounds. No murmur heard. No friction rub. No gallop.   Pulmonary:     Effort: Pulmonary effort is normal. No respiratory distress.      Breath sounds: Normal breath sounds. No stridor. No wheezing, rhonchi or rales.  Chest:     Chest wall: No tenderness.  Musculoskeletal:     Cervical back: Neck supple.  Lymphadenopathy:     Cervical: No cervical adenopathy.  Neurological:     Mental Status: She is alert.           Assessment & Plan:   Ptosis of right eyelid - Plan: Ambulatory referral to Plastic Surgery  Pure hypercholesterolemia  Benign essential HTN  Osteoporosis, unspecified osteoporosis type, unspecified pathological fracture presence  I will gladly place the referral to plastic surgery as requested by the patient for her right upper eyelid ptosis.  I see no evidence of a neuromuscular disorder.  Cholesterol in February was outstanding.  Blood pressure today is well controlled.  I will start the patient on Fosamax 70 mg weekly for osteoporosis in addition to her 1200 mg a day of calcium and 1000 units a day of vitamin D

## 2021-03-29 DIAGNOSIS — H02423 Myogenic ptosis of bilateral eyelids: Secondary | ICD-10-CM | POA: Diagnosis not present

## 2021-03-29 DIAGNOSIS — H53483 Generalized contraction of visual field, bilateral: Secondary | ICD-10-CM | POA: Diagnosis not present

## 2021-03-29 DIAGNOSIS — L718 Other rosacea: Secondary | ICD-10-CM | POA: Diagnosis not present

## 2021-03-29 DIAGNOSIS — H02413 Mechanical ptosis of bilateral eyelids: Secondary | ICD-10-CM | POA: Diagnosis not present

## 2021-03-29 DIAGNOSIS — H57813 Brow ptosis, bilateral: Secondary | ICD-10-CM | POA: Diagnosis not present

## 2021-03-29 DIAGNOSIS — H0279 Other degenerative disorders of eyelid and periocular area: Secondary | ICD-10-CM | POA: Diagnosis not present

## 2021-03-29 DIAGNOSIS — L814 Other melanin hyperpigmentation: Secondary | ICD-10-CM | POA: Diagnosis not present

## 2021-04-02 DIAGNOSIS — H53483 Generalized contraction of visual field, bilateral: Secondary | ICD-10-CM | POA: Diagnosis not present

## 2021-05-22 ENCOUNTER — Other Ambulatory Visit: Payer: Self-pay | Admitting: *Deleted

## 2021-05-22 MED ORDER — FLUTICASONE PROPIONATE 50 MCG/ACT NA SUSP: 2.0000 | Freq: Every day | NASAL | 0 refills | Status: DC

## 2021-05-22 MED ORDER — MELOXICAM 15 MG PO TABS
15.0000 mg | ORAL_TABLET | Freq: Every day | ORAL | 1 refills | Status: DC
Start: 2021-05-22 — End: 2021-09-26

## 2021-05-24 ENCOUNTER — Other Ambulatory Visit: Payer: Self-pay | Admitting: *Deleted

## 2021-05-24 MED ORDER — LEVOCETIRIZINE DIHYDROCHLORIDE 5 MG PO TABS: ORAL_TABLET | ORAL | 3 refills | Status: DC

## 2021-06-18 DIAGNOSIS — M7918 Myalgia, other site: Secondary | ICD-10-CM | POA: Diagnosis not present

## 2021-06-24 DIAGNOSIS — H02413 Mechanical ptosis of bilateral eyelids: Secondary | ICD-10-CM | POA: Diagnosis not present

## 2021-06-24 DIAGNOSIS — H02834 Dermatochalasis of left upper eyelid: Secondary | ICD-10-CM | POA: Diagnosis not present

## 2021-06-24 DIAGNOSIS — H02423 Myogenic ptosis of bilateral eyelids: Secondary | ICD-10-CM | POA: Diagnosis not present

## 2021-06-24 DIAGNOSIS — H02831 Dermatochalasis of right upper eyelid: Secondary | ICD-10-CM | POA: Diagnosis not present

## 2021-06-24 DIAGNOSIS — H53483 Generalized contraction of visual field, bilateral: Secondary | ICD-10-CM | POA: Diagnosis not present

## 2021-07-01 ENCOUNTER — Other Ambulatory Visit: Payer: Self-pay | Admitting: Family Medicine

## 2021-07-01 DIAGNOSIS — E7800 Pure hypercholesterolemia, unspecified: Secondary | ICD-10-CM

## 2021-07-15 ENCOUNTER — Other Ambulatory Visit: Payer: Self-pay

## 2021-07-15 ENCOUNTER — Ambulatory Visit (INDEPENDENT_AMBULATORY_CARE_PROVIDER_SITE_OTHER): Payer: Medicare HMO | Admitting: Family Medicine

## 2021-07-15 VITALS — BP 108/68 | HR 56 | Ht 66.0 in | Wt 179.0 lb

## 2021-07-15 DIAGNOSIS — Z0000 Encounter for general adult medical examination without abnormal findings: Secondary | ICD-10-CM | POA: Diagnosis not present

## 2021-07-15 DIAGNOSIS — Z23 Encounter for immunization: Secondary | ICD-10-CM | POA: Diagnosis not present

## 2021-07-15 DIAGNOSIS — M810 Age-related osteoporosis without current pathological fracture: Secondary | ICD-10-CM

## 2021-07-15 DIAGNOSIS — I10 Essential (primary) hypertension: Secondary | ICD-10-CM

## 2021-07-15 DIAGNOSIS — E7800 Pure hypercholesterolemia, unspecified: Secondary | ICD-10-CM | POA: Diagnosis not present

## 2021-07-15 DIAGNOSIS — M79642 Pain in left hand: Secondary | ICD-10-CM

## 2021-07-15 DIAGNOSIS — H02401 Unspecified ptosis of right eyelid: Secondary | ICD-10-CM

## 2021-07-15 NOTE — Progress Notes (Signed)
Subjective:    Patient ID: Wendy Newman, female    DOB: 18-Apr-1941, 80 y.o.   MRN: [id number]  HPI Patient is a very sweet 80 year old Caucasian female here today for her complete physical exam.  Since I last saw the patient, she had surgery on her eyelid to correct ptosis.  The patient tolerated the surgery well however she did have a reaction to the anesthesia and experienced severe nausea and vomiting for less than 24 hours afterwards.  That has resolved.  She is having some pain in her right shoulder.  She has pain with abduction greater than 90 degrees.  She has pain with internal and external rotation.  She has a positive empty can sign today.  She states that the pain is primarily in the middle of the deltoid muscle.  I suspect subacromial bursitis as well as some supraspinatus tendinitis.  She has not tried any NSAIDs and she would like to try this first prior to proceeding to a cortisone injection.  She also has pain in her left hand.  The pain is located in the left fifth digit directly at the PIP joint.  The finger is also very "crooked".  Patient fell several months ago and believes that she may have injured her finger at that time.  Her mammogram is up-to-date and was performed in January.  She had a colonoscopy in 2016 that was normal and is up-to-date.  She has a hysterectomy and therefore does not require Pap smear.  Her bone density was performed earlier this year due to osteoporosis and showed improvements in her T-scores.  Past Medical History:  Diagnosis Date  . Anxiety   . Cataract   . Edema of both legs   . Essential hypertension   . Osteoporosis   . Vitamin D deficiency    Past Surgical History:  Procedure Laterality Date  . ABDOMINAL HYSTERECTOMY     Complete  . CESAREAN SECTION     X 3  . COLONOSCOPY  10/10/2008   QMG:[id number]  . COLONOSCOPY WITH PROPOFOL N/A 12/21/2014   Dr. Rourk:noncompliant left colon and redundant right colon, rectal and colonic mucosa  appeared normal.    Current Outpatient Medications on File Prior to Visit  Medication Sig Dispense Refill  . alendronate (FOSAMAX) 70 MG tablet Take 1 tablet (70 mg total) by mouth every 7 (seven) days. Take with a full glass of water on an empty stomach. 4 tablet 11  . atorvastatin (LIPITOR) 20 MG tablet TAKE 1 TABLET DAILY 90 tablet 3  . cholecalciferol (VITAMIN D) 1000 UNITS tablet Take 1,000 Units by mouth daily.     . citalopram (CELEXA) 20 MG tablet TAKE 1 TABLET DAILY 90 tablet 3  . fluticasone (FLONASE) 50 MCG/ACT nasal spray Place 2 sprays into both nostrils daily. 48 g 0  . hydrochlorothiazide (HYDRODIURIL) 25 MG tablet TAKE 1 TABLET DAILY 90 tablet 3  . levocetirizine (XYZAL) 5 MG tablet TAKE 1 TABLET BY MOUTH EVERY DAY IN THE EVENING 30 tablet 3  . meloxicam (MOBIC) 15 MG tablet Take 1 tablet (15 mg total) by mouth daily. 90 tablet 1   No current facility-administered medications on file prior to visit.   No Known Allergies Social History   Socioeconomic History  . Marital status: Married    Spouse name: Not on file  . Number of children: 3  . Years of education: Not on file  . Highest education level: Not on file  Occupational History  . 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 Smoking status: Never  . Smokeless tobacco: Never  Substance and Sexual Activity  . Alcohol use: No    Alcohol/week: 0.0 standard drinks  . Drug use: No  . Sexual activity: Yes    Birth control/protection: Surgical  Other Topics Concern  . Not on file  Social History Narrative  . Not on file   Social Determinants of Health   Financial Resource Strain: Not on file  Food Insecurity: Not on file  Transportation Needs: Not on file  Physical Activity: Not on file  Stress: Not on file  Social Connections: Not on file  Intimate Partner Violence: Not on file      Review of Systems  All other systems reviewed and are negative.     Objective:   Physical Exam Vitals  reviewed.  Constitutional:      General: She is not in acute distress.    Appearance: Normal appearance. She is normal weight. She is not ill-appearing, toxic-appearing or diaphoretic.  HENT:     Head: Normocephalic and atraumatic.     Right Ear: Tympanic membrane, ear canal and external ear normal. There is no impacted cerumen.     Left Ear: Tympanic membrane, ear canal and external ear normal. There is no impacted cerumen.     Nose: No congestion or rhinorrhea.     Mouth/Throat:     Mouth: Mucous membranes are moist.     Pharynx: Oropharynx is clear. No oropharyngeal exudate or posterior oropharyngeal erythema.  Eyes:     General:        Left eye: No discharge.     Extraocular Movements: Extraocular movements intact.     Conjunctiva/sclera: Conjunctivae normal.     Pupils: Pupils are equal, round, and reactive to light.  Neck:     Vascular: No carotid bruit.  Cardiovascular:     Rate and Rhythm: Normal rate and regular rhythm.     Pulses: Normal pulses.     Heart sounds: Normal heart sounds. No murmur heard.   No friction rub. No gallop.  Pulmonary:     Effort: Pulmonary effort is normal. No respiratory distress.     Breath sounds: Normal breath sounds. No stridor. No wheezing, rhonchi or rales.  Chest:     Chest wall: No tenderness.  Abdominal:     General: Abdomen is flat. Bowel sounds are normal. There is no distension.     Palpations: Abdomen is soft.     Tenderness: There is no abdominal tenderness. There is no guarding or rebound.  Musculoskeletal:        General: Tenderness and deformity present. No swelling.     Cervical back: Normal range of motion and neck supple. No rigidity or tenderness.     Right lower leg: No edema.     Left lower leg: No edema.  Lymphadenopathy:     Cervical: No cervical adenopathy.  Skin:    Coloration: Skin is not jaundiced.     Findings: No bruising, erythema or rash.  Neurological:     General: No focal deficit present.     Mental  Status: She is alert and oriented to person, place, and time. Mental status is at baseline.     Cranial Nerves: No cranial nerve deficit.     Sensory: No sensory deficit.     Motor: No weakness.     Coordination: Coordination normal.     Gait: Gait normal.  Deep Tendon Reflexes: Reflexes normal.  Psychiatric:        Mood and Affect: Mood normal.        Behavior: Behavior normal.        Thought Content: Thought content normal.        Judgment: Judgment normal.          Assessment & Plan:  Pure hypercholesterolemia - Plan: CBC with Differential/Platelet, COMPLETE METABOLIC PANEL WITH GFR, Lipid panel  Left hand pain - Plan: DG Hand Complete Left  Ptosis of right eyelid  Osteoporosis, unspecified osteoporosis type, unspecified pathological fracture presence  Benign essential HTN  General medical exam Patient's immunizations are up-to-date except for the shingles vaccine which I encouraged her to get of the local pharmacy as well as her flu shot which she received here.  Colon cancer screening is up-to-date.  Breast cancer screening is up-to-date.  Pap smear is not required.  Bone density is due again in 2024.  I believe the patient is having subacromial bursitis.  She will try meloxicam daily for 1 to 2 weeks and if not improving she can come in for a cortisone injection.  I will obtain an x-ray of her left hand but I suspect that she may have fractured the proximal phalanx around the PIP joint.  Blood pressure is well controlled.  Check CBC CMP and fasting lipid panel to monitor hyperlipidemia.  Aside from the fall in the yard earlier this year she shows normal balance.  She denies any memory loss.  Depression is currently managed with Celexa

## 2021-07-16 LAB — COMPLETE METABOLIC PANEL WITH GFR
AG Ratio: 1.4 (calc) (ref 1.0–2.5)
ALT: 8 U/L (ref 6–29)
AST: 14 U/L (ref 10–35)
Albumin: 4.2 g/dL (ref 3.6–5.1)
Alkaline phosphatase (APISO): 56 U/L (ref 37–153)
BUN: 15 mg/dL (ref 7–25)
CO2: 30 mmol/L (ref 20–32)
Calcium: 9.9 mg/dL (ref 8.6–10.4)
Chloride: 104 mmol/L (ref 98–110)
Creat: 0.9 mg/dL (ref 0.60–1.00)
Globulin: 3 g/dL (calc) (ref 1.9–3.7)
Glucose, Bld: 85 mg/dL (ref 65–99)
Potassium: 4 mmol/L (ref 3.5–5.3)
Sodium: 142 mmol/L (ref 135–146)
Total Bilirubin: 0.8 mg/dL (ref 0.2–1.2)
Total Protein: 7.2 g/dL (ref 6.1–8.1)
eGFR: 66 mL/min/{1.73_m2} (ref >=60)

## 2021-07-16 LAB — CBC WITH DIFFERENTIAL/PLATELET
Absolute Monocytes: 540 cells/uL (ref 200–950)
Basophils Absolute: 29 cells/uL (ref 0–200)
Basophils Relative: 0.4 %
Eosinophils Absolute: 58 cells/uL (ref 15–500)
Eosinophils Relative: 0.8 %
HCT: 40.4 % (ref 35.0–45.0)
Hemoglobin: 13 g/dL (ref 11.7–15.5)
Lymphs Abs: 2336 cells/uL (ref 850–3900)
MCH: 30.2 pg (ref 27.0–33.0)
MCHC: 32.2 g/dL (ref 32.0–36.0)
MCV: 94 fL (ref 80.0–100.0)
MPV: 11.2 fL (ref 7.5–12.5)
Monocytes Relative: 7.4 %
Neutro Abs: 4336 cells/uL (ref 1500–7800)
Neutrophils Relative %: 59.4 %
Platelets: 261 10*3/uL (ref 140–400)
RBC: 4.3 10*6/uL (ref 3.80–5.10)
RDW: 13 % (ref 11.0–15.0)
Total Lymphocyte: 32 %
WBC: 7.3 10*3/uL (ref 3.8–10.8)

## 2021-07-16 LAB — LIPID PANEL
Cholesterol: 144 mg/dL (ref <200)
HDL: 43 mg/dL — ABNORMAL LOW (ref >=50)
LDL Cholesterol (Calc): 84 mg/dL (calc)
Non-HDL Cholesterol (Calc): 101 mg/dL (calc) (ref <130)
Total CHOL/HDL Ratio: 3.3 (calc) (ref <5.0)
Triglycerides: 79 mg/dL (ref <150)

## 2021-07-22 ENCOUNTER — Other Ambulatory Visit: Payer: Self-pay

## 2021-07-22 ENCOUNTER — Ambulatory Visit (HOSPITAL_COMMUNITY)
Admission: RE | Admit: 2021-07-22 | Discharge: 2021-07-22 | Disposition: A | Discharge status: Home / Self Care | Payer: Medicare HMO | Source: Ambulatory Visit | Attending: Family Medicine | Admitting: Family Medicine

## 2021-07-22 DIAGNOSIS — M19042 Primary osteoarthritis, left hand: Secondary | ICD-10-CM | POA: Diagnosis not present

## 2021-07-22 DIAGNOSIS — M79642 Pain in left hand: Secondary | ICD-10-CM | POA: Diagnosis not present

## 2021-08-05 DIAGNOSIS — H40013 Open angle with borderline findings, low risk, bilateral: Secondary | ICD-10-CM | POA: Diagnosis not present

## 2021-09-16 ENCOUNTER — Other Ambulatory Visit: Payer: Self-pay | Admitting: Family Medicine

## 2021-09-26 ENCOUNTER — Emergency Department (HOSPITAL_COMMUNITY): Payer: Medicare HMO

## 2021-09-26 ENCOUNTER — Emergency Department (HOSPITAL_COMMUNITY)
Admission: EM | Admit: 2021-09-26 | Discharge: 2021-09-26 | Disposition: A | Discharge status: Home / Self Care | Payer: Medicare HMO | Attending: Emergency Medicine | Admitting: Emergency Medicine

## 2021-09-26 ENCOUNTER — Encounter (HOSPITAL_COMMUNITY): Payer: Self-pay | Admitting: *Deleted

## 2021-09-26 DIAGNOSIS — I82432 Acute embolism and thrombosis of left popliteal vein: Secondary | ICD-10-CM | POA: Diagnosis not present

## 2021-09-26 DIAGNOSIS — M79652 Pain in left thigh: Secondary | ICD-10-CM | POA: Diagnosis not present

## 2021-09-26 DIAGNOSIS — I10 Essential (primary) hypertension: Secondary | ICD-10-CM | POA: Insufficient documentation

## 2021-09-26 DIAGNOSIS — M25552 Pain in left hip: Secondary | ICD-10-CM | POA: Diagnosis not present

## 2021-09-26 DIAGNOSIS — M79662 Pain in left lower leg: Secondary | ICD-10-CM | POA: Diagnosis not present

## 2021-09-26 DIAGNOSIS — M7989 Other specified soft tissue disorders: Secondary | ICD-10-CM | POA: Diagnosis not present

## 2021-09-26 MED ORDER — RIVAROXABAN (XARELTO) EDUCATION KIT FOR DVT/PE PATIENTS: PACK | Freq: Once | Status: AC

## 2021-09-26 MED ORDER — RIVAROXABAN 20 MG PO TABS: 20.0000 mg | ORAL_TABLET | Freq: Every day | ORAL | Status: DC

## 2021-09-26 MED ORDER — RIVAROXABAN 15 MG PO TABS
15.0000 mg | ORAL_TABLET | Freq: Two times a day (BID) | ORAL | Status: DC
  Administered 2021-09-26: 15 mg via ORAL
  Filled 2021-09-26: qty 1

## 2021-09-26 MED ORDER — ACETAMINOPHEN 325 MG PO TABS
650.0000 mg | ORAL_TABLET | Freq: Once | ORAL | Status: AC
  Administered 2021-09-26: 650 mg via ORAL
  Filled 2021-09-26: qty 2

## 2021-09-26 MED ORDER — RIVAROXABAN (XARELTO) VTE STARTER PACK (15 & 20 MG): ORAL_TABLET | ORAL | 0 refills | Status: DC

## 2021-09-26 NOTE — ED Notes (Signed)
Pt on the phone with pharmacist at this time for xarelto education.

## 2021-09-26 NOTE — ED Notes (Signed)
X-ray at bedside

## 2021-09-26 NOTE — Discharge Instructions (Addendum)
You have a blood clot in your left leg.  You need to be on a blood thinner for at least 3 months and you will have to discuss with your primary care physician for how long to be on a blood thinner.  If you develop any bleeding or even minor trauma, especially minor head trauma, you need to be seen as this could cause severe or deadly bleeding.  Do NOT take aspirin or NSAIDs such as Ibuprofen/Advil/Aleve/Motrin/Goody Powders/Naproxen/BC powders/Meloxicam/Diclofenac/Indomethacin and other Nonsteroidal anti-inflammatory medications    Return to the ER if you develop new or worsening pain in your leg, chest pain, shortness of breath, dizziness, or any other new/concerning symptoms.

## 2021-09-26 NOTE — ED Provider Notes (Signed)
[facility] EMERGENCY DEPARTMENT Provider Note   CSN: [id number] Arrival date & time: 09/26/21  1501     History Chief Complaint  Patient presents with   Leg Pain    Wendy Newman is a 80 y.o. female.  HPI 80 year old female presents with left leg swelling and pain and concern for DVT.  About 1 month ago she fell on her left leg and she thinks she injured the outside of it.  She has been having on and off pain that is a throbbing ever since.  Over the last 2 or 3 days she has noticed swelling to her calf.  She is also having pain to the anterior part of her lower leg.  Some soreness to her left lateral thigh but this not nearly as bad as the lower leg.  She rates the pain as a 9.  She has not taken anything for it.  There is no chest pain or shortness of breath.  Past Medical History:  Diagnosis Date   Anxiety    Cataract    Edema of both legs    Essential hypertension    Osteoporosis    Vitamin D deficiency     Patient Active Problem List   Diagnosis Date Noted   Allergic rhinoconjunctivitis 07/02/2017   Family history of colonic polyps 11/28/2014   Family history of colon cancer 11/28/2014   Constipation 11/28/2014   Vitamin D deficiency 06/16/2013   Hypertension    Anxiety    Osteoporosis    Cataract    Hyperlipemia 05/18/2009   Anxiety state 05/18/2009   NECK PAIN 05/18/2009   ALLERGIC RHINITIS 08/28/2008   OSTEOARTHRITIS 08/28/2008   POSTMENOPAUSAL OSTEOPOROSIS 08/28/2008   PAT 04/24/2008   OTHER ABNORMAL BLOOD CHEMISTRY 04/24/2008   Essential hypertension 12/16/2007   HEADACHE 12/16/2007    Past Surgical History:  Procedure Laterality Date   ABDOMINAL HYSTERECTOMY     Complete   CESAREAN SECTION     X 3   COLONOSCOPY  10/10/2008   [id number]   COLONOSCOPY WITH PROPOFOL N/A 12/21/2014   Dr. Rourk:noncompliant left colon and redundant right colon, rectal and colonic mucosa appeared normal.      OB History   No obstetric history on file.      Family History  Problem Relation Age of Onset   Cancer Mother 76       Breast. age 66.    Colon polyps Mother    Heart disease Father 32       CABG   Cancer Sister 36       Lung   Cancer Brother 65       Started in the back   Colon cancer Other        Age 40s. maternal grandmother    Social History   Tobacco Use   Smoking status: Never   Smokeless tobacco: Never  Substance Use Topics   Alcohol use: No    Alcohol/week: 0.0 standard drinks   Drug use: No    Home Medications Prior to Admission medications   Medication Sig Start Date End Date Taking? Authorizing Provider  RIVAROXABAN Alveda Reasons) VTE STARTER PACK (15 & 20 MG) Follow package directions: Take one 22m tablet by mouth twice a day. On day 22, switch to one 242mtablet once a day. Take with food. 09/26/21  Yes GoSherwood GamblerMD  alendronate (FOSAMAX) 70 MG tablet Take 1 tablet (70 mg total) by mouth every 7 (seven) days. Take with a full glass of  water on an empty stomach. 03/26/21   Susy Frizzle, MD  atorvastatin (LIPITOR) 20 MG tablet TAKE 1 TABLET DAILY 07/05/21   Susy Frizzle, MD  cholecalciferol (VITAMIN D) 1000 UNITS tablet Take 1,000 Units by mouth daily.     [provider]  citalopram (CELEXA) 20 MG tablet TAKE 1 TABLET DAILY 01/22/21   Susy Frizzle, MD  fluticasone [facility]) 50 MCG/ACT nasal spray Place 2 sprays into both nostrils daily. 05/22/21   Susy Frizzle, MD  hydrochlorothiazide (HYDRODIURIL) 25 MG tablet TAKE 1 TABLET DAILY 01/22/21   Susy Frizzle, MD  levocetirizine (XYZAL) 5 MG tablet TAKE 1 TABLET EVERY EVENING 09/16/21   Susy Frizzle, MD    Allergies    Patient has no known allergies.  Review of Systems   Review of Systems  Respiratory:  Negative for shortness of breath.   Cardiovascular:  Positive for leg swelling. Negative for chest pain.  Musculoskeletal:  Positive for arthralgias and myalgias.  All other systems reviewed and are negative.  Physical  Exam Updated Vital Signs BP (!) 151/96 (BP Location: Right Arm)   Pulse 65   Temp 98.1 F (36.7 C) (Oral)   Resp 18   SpO2 96%   Physical Exam Vitals and nursing note reviewed.  Constitutional:      Appearance: She is well-developed.  HENT:     Head: Normocephalic and atraumatic.     Right Ear: External ear normal.     Left Ear: External ear normal.     Nose: Nose normal.  Eyes:     General:        Right eye: No discharge.        Left eye: No discharge.  Cardiovascular:     Rate and Rhythm: Normal rate and regular rhythm.     Pulses:          Dorsalis pedis pulses are 2+ on the left side.  Pulmonary:     Effort: Pulmonary effort is normal.  Abdominal:     General: There is no distension.  Musculoskeletal:     Left hip: No tenderness. Normal range of motion.     Left upper leg: No swelling or tenderness.     Left knee: Normal range of motion. No tenderness.     Left lower leg: Swelling, tenderness and bony tenderness (tenderness over proximal 1/3 of tibia) present.  Skin:    General: Skin is warm and dry.  Neurological:     Mental Status: She is alert.  Psychiatric:        Mood and Affect: Mood is not anxious.    ED Results / Procedures / Treatments   Labs (all labs ordered are listed, but only abnormal results are displayed) Labs Reviewed - No data to display  EKG None  Radiology DG Tibia/Fibula Left  Result Date: 09/26/2021 CLINICAL DATA:  left proximal tibia injury 1 month ago EXAM: LEFT TIBIA AND FIBULA - 2 VIEW COMPARISON:  None. FINDINGS: There is no acute osseous abnormality. No focal bone lesion identified. There is mild lower extremity soft tissue swelling and suggestion of superficial varicosities. IMPRESSION: No acute osseous abnormality. Electronically Signed   By: Maurine Simmering M.D.   On: 09/26/2021 16:10   US Venous Img Lower Unilateral Left (DVT)  Result Date: 09/26/2021 CLINICAL DATA:  left proximal tibia injury 1 month ago, left calf swelling  EXAM: LEFT LOWER EXTREMITY VENOUS DOPPLER ULTRASOUND TECHNIQUE: Gray-scale sonography with graded compression, as well as  color Doppler and duplex ultrasound were performed to evaluate the lower extremity deep venous systems from the level of the common femoral vein and including the common femoral, femoral, profunda femoral, popliteal and calf veins including the posterior tibial, peroneal and gastrocnemius veins when visible. The superficial great saphenous vein was also interrogated. Spectral Doppler was utilized to evaluate flow at rest and with distal augmentation maneuvers in the common femoral, femoral and popliteal veins. COMPARISON:  None. FINDINGS: Contralateral Common Femoral Vein: Respiratory phasicity is normal and symmetric with the symptomatic side. No evidence of thrombus. Normal compressibility. Common Femoral Vein: No evidence of thrombus. Normal compressibility, respiratory phasicity and response to augmentation. Saphenofemoral Junction: No evidence of thrombus. Normal compressibility and flow on color Doppler imaging. Profunda Femoral Vein: No evidence of thrombus. Normal compressibility and flow on color Doppler imaging. Femoral Vein: No evidence of thrombus. Normal compressibility, respiratory phasicity and response to augmentation. Popliteal Vein: The main popliteal vein is patent without evidence of thrombus, normal compressibility, respiratory phasicity and response to augmentation. There appears to be a short-segment duplicated popliteal vein which demonstrates noncompressibility and no flow. There is a superficial vein in the calf, possibly the lesser saphenous vein, which drains into this vein. Calf Veins: No evidence of thrombus. Normal compressibility and flow on color Doppler imaging. Other Findings:  None. IMPRESSION: There appears to be acute appearing short-segment DVT within 1 of 2 duplicated popliteal veins. Electronically Signed   By: Albin Felling M.D.   On: 09/26/2021 16:48     Procedures Procedures   Medications Ordered in ED Medications  rivaroxaban (XARELTO) Education Kit for DVT/PE patients (has no administration in time range)  acetaminophen (TYLENOL) tablet 650 mg (650 mg Oral Given 09/26/21 1526)    ED Course  I have reviewed the triage vital signs and the nursing notes.  Pertinent labs & imaging results that were available during my care of the patient were reviewed by me and considered in my medical decision making (see chart for details).    MDM Rules/Calculators/A&P                           Patient's DVT ultrasound is positive for popliteal DVT on the left side.  Given this she will be started on Xarelto.  She had normal renal function a few months ago.  At this point she is not having any symptoms that would be concerning for PE.  She appears stable for outpatient treatment.  We discussed bleeding risks and strict return precautions.  She does not have any previous history of any significant bleeding. Final Clinical Impression(s) / ED Diagnoses Final diagnoses:  Acute deep vein thrombosis (DVT) of popliteal vein of left lower extremity ([place])    Rx / DC Orders ED Discharge Orders          Ordered    RIVAROXABAN (XARELTO) VTE STARTER PACK (15 & 20 MG)        09/26/21 1703             Sherwood Gambler, MD 09/26/21 1705

## 2021-09-26 NOTE — Progress Notes (Signed)
I spoke with patient on phone regarding Xarelto therapy.  Provided patient counseling, discussed dosing and recommended patient avoid ibuprofen or alleve and use acetaminophen if OTC pain medication needed while on Xarelto.  Will provide pharmacy coupon discount card for patient prior to discharge.  Pt did not have any questions.   Valrie Hart, PharmD

## 2021-09-26 NOTE — ED Notes (Signed)
Pt ambulated to restroom without assistance.

## 2021-09-26 NOTE — Progress Notes (Signed)
ANTICOAGULATION CONSULT NOTE - Initial Consult  Pharmacy Consult for Xarelto Indication: DVT  No Known Allergies  Patient Measurements:   Vital Signs: Temp: 98.1 F (36.7 C) (12/01 1703) Temp Source: Oral (12/01 1703) BP: 151/96 (12/01 1703) Pulse Rate: 65 (12/01 1703)  Labs: No results for input(s): HGB, HCT, PLT, APTT, LABPROT, INR, HEPARINUNFRC, HEPRLOWMOCWT, CREATININE, CKTOTAL, CKMB, TROPONINIHS in the last 72 hours.  CrCl cannot be calculated (Patient's most recent lab result is older than the maximum 21 days allowed.).  Medical History: Past Medical History:  Diagnosis Date   Anxiety    Cataract    Edema of both legs    Essential hypertension    Osteoporosis    Vitamin D deficiency    Medications:  (Not in a hospital admission)   Assessment: Spoke w/ patient via phone.  Pt states she has been diagnosed with blood clot in leg.  Provided pt counseling for Xarelto and will provide discount pharmacy card.   Goal of Therapy:  Treatment for DVT Monitor platelets by anticoagulation protocol: Yes   Plan:  Xarelto 15mg  po BID x 21 days then 20mg  po daily Provided patient counseling via phone  A 09/26/2021,5:18 PM

## 2021-09-26 NOTE — ED Notes (Signed)
This nurse called pharmacist to educate pt on xarelto and for the education kit.

## 2021-09-26 NOTE — ED Notes (Signed)
US at bedside

## 2021-09-26 NOTE — ED Notes (Signed)
Pt educated on staying after administration of xarelto to ensure she does not have a reaction to the medication. Pt verbalized understanding.

## 2021-09-26 NOTE — ED Triage Notes (Signed)
Left leg pain, feel a month ago, referred from urgent care for an ultrasound

## 2021-09-30 ENCOUNTER — Ambulatory Visit: Payer: Medicare HMO | Admitting: Family Medicine

## 2021-10-02 ENCOUNTER — Other Ambulatory Visit: Payer: Self-pay

## 2021-10-02 ENCOUNTER — Ambulatory Visit (INDEPENDENT_AMBULATORY_CARE_PROVIDER_SITE_OTHER): Payer: Medicare HMO | Admitting: Nurse Practitioner

## 2021-10-02 ENCOUNTER — Encounter: Payer: Self-pay | Admitting: Nurse Practitioner

## 2021-10-02 VITALS — BP 144/90 | HR 65 | Ht 66.0 in | Wt 182.2 lb

## 2021-10-02 DIAGNOSIS — I82432 Acute embolism and thrombosis of left popliteal vein: Secondary | ICD-10-CM | POA: Diagnosis not present

## 2021-10-02 NOTE — Progress Notes (Signed)
Subjective:    Patient ID: Wendy Newman, female    DOB: [birth year], 80 y.o.   MRN: [id number]  HPI: Wendy Newman is a 80 y.o. female presenting for ER follow up.  Chief Complaint  Patient presents with   Follow-up    ER follow up blood clot left leg cant afford xarelto    ER FOLLOW UP Time since discharge: ~6 days  Hospital/facility: [facility]   Diagnosis: acute DVT in popilteal vein of left lower extremity  Procedures/tests: ultrasound of left lower extremity  Consultants: none  New medications: Xarelto   Discharge instructions:  Start Xarelto, follow up with PCP  Status: stable  DVT Patient reports she is tolerating the Xarelto well.  She is worried about the cost - reports this will be $500 monthly and she cannot afford this.  Denies falls, easy bruising or bleeding.  Reports this is her first DVT.  Does not have a family history of clotting or bleeding disorders.  No family history of DVT.  Reports father died of MI when he was 9 years old.  Tylenol is controlling the pain in her left leg.  She is wearing compression stockings. Duration: weeks Onset: gradual Location: left lower extremity  No Known Allergies  Outpatient Encounter Medications as of 10/02/2021  Medication Sig   alendronate (FOSAMAX) 70 MG tablet Take 1 tablet (70 mg total) by mouth every 7 (seven) days. Take with a full glass of water on an empty stomach.   atorvastatin (LIPITOR) 20 MG tablet TAKE 1 TABLET DAILY   cholecalciferol (VITAMIN D) 1000 UNITS tablet Take 1,000 Units by mouth daily.    citalopram (CELEXA) 20 MG tablet TAKE 1 TABLET DAILY   fluticasone (FLONASE) 50 MCG/ACT nasal spray Place 2 sprays into both nostrils daily.   hydrochlorothiazide (HYDRODIURIL) 25 MG tablet TAKE 1 TABLET DAILY   levocetirizine (XYZAL) 5 MG tablet TAKE 1 TABLET EVERY EVENING   RIVAROXABAN (XARELTO) VTE STARTER PACK (15 & 20 MG) Follow package directions: Take one 15mg  tablet by mouth twice a day. On day  22, switch to one 20mg  tablet once a day. Take with food.   SHINGRIX injection    No facility-administered encounter medications on file as of 10/02/2021.    Patient Active Problem List   Diagnosis Date Noted   Acute deep vein thrombosis (DVT) of popliteal vein of left lower extremity (HCC) 10/02/2021   Allergic rhinoconjunctivitis 07/02/2017   Family history of colonic polyps 11/28/2014   Family history of colon cancer 11/28/2014   Constipation 11/28/2014   Vitamin D deficiency 06/16/2013   Hypertension    Anxiety    Osteoporosis    Cataract    Hyperlipemia 05/18/2009   Anxiety state 05/18/2009   NECK PAIN 05/18/2009   ALLERGIC RHINITIS 08/28/2008   OSTEOARTHRITIS 08/28/2008   POSTMENOPAUSAL OSTEOPOROSIS 08/28/2008   PAT 04/24/2008   OTHER ABNORMAL BLOOD CHEMISTRY 04/24/2008   Essential hypertension 12/16/2007   HEADACHE 12/16/2007    Past Medical History:  Diagnosis Date   Anxiety    Cataract    Edema of both legs    Essential hypertension    Osteoporosis    Vitamin D deficiency     Relevant past medical, surgical, family and social history reviewed and updated as indicated. Interim medical history since our last visit reviewed.  Review of Systems Per HPI unless specifically indicated above     Objective:    BP (!) 144/90   Pulse 65   Ht  5\' 6"  (1.676 m)   Wt 182 lb 3.2 oz (82.6 kg)   SpO2 97%   BMI 29.41 kg/m   Wt Readings from Last 3 Encounters:  10/02/21 182 lb 3.2 oz (82.6 kg)  07/15/21 179 lb (81.2 kg)  03/26/21 183 lb (83 kg)    Physical Exam Vitals and nursing note reviewed.  Constitutional:      General: She is not in acute distress.    Appearance: Normal appearance. She is not toxic-appearing.  Cardiovascular:     Rate and Rhythm: Normal rate and regular rhythm.     Heart sounds: Normal heart sounds. No murmur heard. Pulmonary:     Effort: Pulmonary effort is normal. No respiratory distress.     Breath sounds: Normal breath sounds. No  wheezing, rhonchi or rales.  Musculoskeletal:        General: Tenderness (calf, mild erythema to left posterior calf) present.     Right lower leg: No edema.     Left lower leg: No edema.  Skin:    General: Skin is warm and dry.     Capillary Refill: Capillary refill takes less than 2 seconds.     Coloration: Skin is not jaundiced or pale.     Findings: No bruising or erythema.  Neurological:     Mental Status: She is alert and oriented to person, place, and time.     Motor: No weakness.     Gait: Gait normal.  Psychiatric:        Mood and Affect: Mood normal.        Behavior: Behavior normal.        Thought Content: Thought content normal.        Judgment: Judgment normal.      Assessment & Plan:   Problem List Items Addressed This Visit       Cardiovascular and Mediastinum   Acute deep vein thrombosis (DVT) of popliteal vein of left lower extremity (HCC) - Primary    Acute.  Patient has enough Xarelto to finish first month of treatment.  Additionally, I gave her another 28 days of Xarelto 20 mg samples until we can figure out a long term plan.  The patient will need to be on Xarelto for at least 3 months - full length of time to be determined by PCP.  Referral placed to [facility] to help figure out affordability options for patient after samples run out.  Advised to continue compression and exercise as tolerated.  Follow up with PCP as scheduled in March.      Relevant Orders   AMB Referral to [facility]     Follow up plan: Return for as scheduled.

## 2021-10-02 NOTE — Assessment & Plan Note (Addendum)
Acute.  Patient has enough Xarelto to finish first month of treatment.  Additionally, I gave her another 28 days of Xarelto 20 mg samples until we can figure out a long term plan.  The patient will need to be on Xarelto for at least 3 months - full length of time to be determined by PCP.  Referral placed to [facility] to help figure out affordability options for patient after samples run out.  Advised to continue compression and exercise as tolerated.  Follow up with PCP as scheduled in March.

## 2021-10-03 ENCOUNTER — Telehealth: Payer: Self-pay | Admitting: Family Medicine

## 2021-10-03 ENCOUNTER — Ambulatory Visit: Payer: Medicare HMO | Admitting: Family Medicine

## 2021-10-03 NOTE — Chronic Care Management (AMB) (Signed)
  Chronic Care Management   Outreach Note  10/03/2021 Name: Wendy Newman MRN: [id number] DOB: 1941-04-12  Referred by: Donita Brooks, MD Reason for referral : No chief complaint on file.   An unsuccessful telephone outreach was attempted today. The patient was referred to the pharmacist for assistance with care management and care coordination.   Follow Up Plan:   Tatjana Dellinger [facility] Scheduler

## 2021-10-09 ENCOUNTER — Telehealth: Payer: Self-pay | Admitting: Family Medicine

## 2021-10-09 NOTE — Chronic Care Management (AMB) (Signed)
°  Chronic Care Management   Outreach Note  10/09/2021 Name: JAQUELIN MEANEY MRN: [id number] DOB: 27-Feb-1941  Referred by: Donita Brooks, MD Reason for referral : No chief complaint on file.   A second unsuccessful telephone outreach was attempted today. The patient was referred to pharmacist for assistance with care management and care coordination.  Follow Up Plan:   Tatjana Dellinger [facility] Scheduler

## 2021-10-14 ENCOUNTER — Telehealth: Payer: Self-pay | Admitting: Family Medicine

## 2021-10-14 NOTE — Chronic Care Management (AMB) (Signed)
°  Chronic Care Management   Outreach Note  10/14/2021 Name: Wendy Newman MRN: [id number] DOB: 05-09-1941  Referred by: Donita Brooks, MD Reason for referral : No chief complaint on file.   Third unsuccessful telephone outreach was attempted today. The patient was referred to the pharmacist for assistance with care management and care coordination.   Follow Up Plan:   Tatjana Dellinger [facility] Scheduler

## 2021-10-29 ENCOUNTER — Telehealth: Payer: Self-pay | Admitting: Family Medicine

## 2021-10-29 NOTE — Telephone Encounter (Signed)
Patient called to request samples of Xarelto.  Also has new insurance. Requesting provider fax all prescriptions to new pharmacy:  [facility], fax [phone] Contact # is [phone]  Please advise at [phone].

## 2021-10-29 NOTE — Telephone Encounter (Signed)
Left message to clarify xarelto dosage.

## 2021-10-31 DIAGNOSIS — R69 Illness, unspecified: Secondary | ICD-10-CM | POA: Diagnosis not present

## 2021-10-31 NOTE — Telephone Encounter (Signed)
Left message and sent mychart message for patient to call back and clarify xarelto dosage.

## 2021-11-01 ENCOUNTER — Telehealth: Payer: Self-pay | Admitting: *Deleted

## 2021-11-01 MED ORDER — RIVAROXABAN 20 MG PO TABS
20.0000 mg | ORAL_TABLET | Freq: Every day | ORAL | 1 refills | Status: DC
Start: 2021-11-01 — End: 2022-01-13

## 2021-11-01 NOTE — Telephone Encounter (Signed)
LVM--regarding Xarelto

## 2021-11-01 NOTE — Telephone Encounter (Signed)
Pt called requesting  sample of Xarelto 20 mg 1 tab daily.

## 2021-11-01 NOTE — Telephone Encounter (Signed)
Spoke with patient she is taking xarelto 20mg  daily.  She says she cannot afford this.  Advised her to go to [email or web address].[email or web address] and sign up for the savings program.  Will also reach out to pharmacy to see if there are any other options.  Patient agrees.

## 2021-11-04 NOTE — Telephone Encounter (Signed)
If she has medicare she will not be able to use the coupons cards for the savings program.  If she needs assistance she could look into the patient assistance program.  However they require an out of pocket minimum spend to qualify and that is hard to reach this early in the year.

## 2021-12-03 ENCOUNTER — Encounter (HOSPITAL_COMMUNITY): Payer: Self-pay

## 2021-12-03 ENCOUNTER — Other Ambulatory Visit: Payer: Self-pay

## 2021-12-03 ENCOUNTER — Emergency Department (HOSPITAL_COMMUNITY): Payer: Medicare Other

## 2021-12-03 ENCOUNTER — Emergency Department (HOSPITAL_COMMUNITY)
Admission: EM | Admit: 2021-12-03 | Discharge: 2021-12-03 | Disposition: A | Discharge status: Home / Self Care | Payer: Medicare Other | Attending: Emergency Medicine | Admitting: Emergency Medicine

## 2021-12-03 DIAGNOSIS — S7002XA Contusion of left hip, initial encounter: Secondary | ICD-10-CM | POA: Insufficient documentation

## 2021-12-03 DIAGNOSIS — Z7901 Long term (current) use of anticoagulants: Secondary | ICD-10-CM | POA: Insufficient documentation

## 2021-12-03 DIAGNOSIS — S40011A Contusion of right shoulder, initial encounter: Secondary | ICD-10-CM | POA: Insufficient documentation

## 2021-12-03 DIAGNOSIS — M25511 Pain in right shoulder: Secondary | ICD-10-CM | POA: Diagnosis not present

## 2021-12-03 DIAGNOSIS — M79601 Pain in right arm: Secondary | ICD-10-CM | POA: Diagnosis present

## 2021-12-03 DIAGNOSIS — W208XXA Other cause of strike by thrown, projected or falling object, initial encounter: Secondary | ICD-10-CM | POA: Insufficient documentation

## 2021-12-03 DIAGNOSIS — Y92009 Unspecified place in unspecified non-institutional (private) residence as the place of occurrence of the external cause: Secondary | ICD-10-CM | POA: Diagnosis not present

## 2021-12-03 DIAGNOSIS — M25552 Pain in left hip: Secondary | ICD-10-CM | POA: Diagnosis not present

## 2021-12-03 DIAGNOSIS — W19XXXA Unspecified fall, initial encounter: Secondary | ICD-10-CM

## 2021-12-03 NOTE — ED Provider Notes (Signed)
[facility] EMERGENCY DEPARTMENT  Provider Note  CSN: [id number] Arrival date & time: 12/03/21 1951  History Chief Complaint  Patient presents with   Marletta Lor    Wendy Newman is a 81 y.o. female with history of prior DVT on Xarelto reports she stumbled and fell at home a short time before arrival hitting her R shoulder on a door jamb and then falling to the floor landing on her L hip. Denies any head injury. Complaining of pain in R upper arm and L lateral hip. Some back soreness but not very severe.    Home Medications Prior to Admission medications   Medication Sig Start Date End Date Taking? Authorizing Provider  alendronate (FOSAMAX) 70 MG tablet Take 1 tablet (70 mg total) by mouth every 7 (seven) days. Take with a full glass of water on an empty stomach. 03/26/21   Donita Brooks, MD  atorvastatin (LIPITOR) 20 MG tablet TAKE 1 TABLET DAILY 07/05/21   Donita Brooks, MD  cholecalciferol (VITAMIN D) 1000 UNITS tablet Take 1,000 Units by mouth daily.     [provider]  citalopram (CELEXA) 20 MG tablet TAKE 1 TABLET DAILY 01/22/21   Donita Brooks, MD  fluticasone [facility]) 50 MCG/ACT nasal spray Place 2 sprays into both nostrils daily. 05/22/21   Donita Brooks, MD  hydrochlorothiazide (HYDRODIURIL) 25 MG tablet TAKE 1 TABLET DAILY 01/22/21   Donita Brooks, MD  levocetirizine (XYZAL) 5 MG tablet TAKE 1 TABLET EVERY EVENING 09/16/21   Donita Brooks, MD  rivaroxaban (XARELTO) 20 MG TABS tablet Take 1 tablet (20 mg total) by mouth daily. 11/01/21   Donita Brooks, MD  RIVAROXABAN Carlena Hurl) VTE STARTER PACK (15 & 20 MG) Follow package directions: Take one 15mg  tablet by mouth twice a day. On day 22, switch to one 20mg  tablet once a day. Take with food. 09/26/21   , MD  [facility] injection  07/22/21   [provider]     Allergies    Patient has no known allergies.   Review of Systems   Review of Systems Please see HPI for pertinent  positives and negatives  Physical Exam BP (!) 168/116    Pulse 63    Temp 98.1 F (36.7 C) (Oral)    Resp 18    Ht 5\' 6"  (1.676 m)    Wt 81.6 kg    SpO2 100%    BMI 29.05 kg/m   Physical Exam Vitals and nursing note reviewed.  Constitutional:      Appearance: Normal appearance.  HENT:     Head: Normocephalic and atraumatic.     Nose: Nose normal.     Mouth/Throat:     Mouth: Mucous membranes are moist.  Eyes:     Extraocular Movements: Extraocular movements intact.     Conjunctiva/sclera: Conjunctivae normal.  Cardiovascular:     Rate and Rhythm: Normal rate.  Pulmonary:     Effort: Pulmonary effort is normal.     Breath sounds: Normal breath sounds.  Abdominal:     General: Abdomen is flat.     Palpations: Abdomen is soft.     Tenderness: There is no abdominal tenderness.  Musculoskeletal:        General: Tenderness (R proximal humerus, L lateral hip) present. No swelling or deformity. Normal range of motion.     Cervical back: Neck supple. No tenderness.     Comments: No midline spine tenderness  Skin:    General:  Skin is warm and dry.  Neurological:     General: No focal deficit present.     Mental Status: She is alert.  Psychiatric:        Mood and Affect: Mood normal.    ED Results / Procedures / Treatments   EKG None  Procedures Procedures  Medications Ordered in the ED Medications - No data to display  Initial Impression and Plan  Patient with extremity pain after a fall. She is on Xarelto but did not have a head injury. Will check xrays of her areas of concern.   ED Course   Clinical Course as of 12/03/21 2216  Tue Dec 03, 2021  2151 Xrays neg for fracture or other bony issues. Plan discharge home with OTC pain meds as needed. Rest, ice and elevated. PCP follow up.  [CS]    Clinical Course User Index [CS] Pollyann Savoy, MD     MDM Rules/Calculators/A&P Medical Decision Making Problems Addressed: Contusion of left hip, initial encounter:  acute illness or injury Contusion of right shoulder, initial encounter: acute illness or injury Fall, initial encounter: acute illness or injury that poses a threat to life or bodily functions  Amount and/or Complexity of Data Reviewed Radiology: ordered and independent interpretation performed.  Risk OTC drugs.    Final Clinical Impression(s) / ED Diagnoses Final diagnoses:  Fall, initial encounter  Contusion of right shoulder, initial encounter  Contusion of left hip, initial encounter    Rx / DC Orders ED Discharge Orders     None        Pollyann Savoy, MD 12/03/21 2216

## 2021-12-03 NOTE — ED Triage Notes (Signed)
Pt states she fell today,says she hit her right arm on the door frame and landed on her left hip. C/o right arm pain, left hip and back pain. Denies hitting head or LOC, pt does take Xarelto.  Pt ambulatory in triage.

## 2021-12-06 ENCOUNTER — Other Ambulatory Visit (HOSPITAL_COMMUNITY): Payer: Self-pay | Admitting: Family Medicine

## 2021-12-06 DIAGNOSIS — Z1231 Encounter for screening mammogram for malignant neoplasm of breast: Secondary | ICD-10-CM

## 2021-12-11 ENCOUNTER — Other Ambulatory Visit: Payer: Self-pay

## 2021-12-11 ENCOUNTER — Ambulatory Visit (HOSPITAL_COMMUNITY)
Admission: RE | Admit: 2021-12-11 | Discharge: 2021-12-11 | Disposition: A | Discharge status: Home / Self Care | Payer: Medicare Other | Source: Ambulatory Visit | Attending: Family Medicine | Admitting: Family Medicine

## 2021-12-11 DIAGNOSIS — Z1231 Encounter for screening mammogram for malignant neoplasm of breast: Secondary | ICD-10-CM | POA: Insufficient documentation

## 2022-01-04 ENCOUNTER — Other Ambulatory Visit: Payer: Self-pay

## 2022-01-04 ENCOUNTER — Emergency Department (HOSPITAL_COMMUNITY): Payer: Medicare Other

## 2022-01-04 ENCOUNTER — Encounter (HOSPITAL_COMMUNITY): Payer: Self-pay | Admitting: Emergency Medicine

## 2022-01-04 ENCOUNTER — Emergency Department (HOSPITAL_COMMUNITY)
Admission: EM | Admit: 2022-01-04 | Discharge: 2022-01-04 | Disposition: A | Discharge status: Home / Self Care | Payer: Medicare Other | Attending: Emergency Medicine | Admitting: Emergency Medicine

## 2022-01-04 DIAGNOSIS — Z79899 Other long term (current) drug therapy: Secondary | ICD-10-CM | POA: Insufficient documentation

## 2022-01-04 DIAGNOSIS — S8981XA Other specified injuries of right lower leg, initial encounter: Secondary | ICD-10-CM | POA: Diagnosis not present

## 2022-01-04 DIAGNOSIS — I10 Essential (primary) hypertension: Secondary | ICD-10-CM | POA: Insufficient documentation

## 2022-01-04 DIAGNOSIS — M7989 Other specified soft tissue disorders: Secondary | ICD-10-CM | POA: Diagnosis not present

## 2022-01-04 DIAGNOSIS — Z7901 Long term (current) use of anticoagulants: Secondary | ICD-10-CM | POA: Diagnosis not present

## 2022-01-04 DIAGNOSIS — M23342 Other meniscus derangements, anterior horn of lateral meniscus, left knee: Secondary | ICD-10-CM | POA: Diagnosis not present

## 2022-01-04 DIAGNOSIS — M25561 Pain in right knee: Secondary | ICD-10-CM | POA: Insufficient documentation

## 2022-01-04 MED ORDER — HYDROCODONE-ACETAMINOPHEN 5-325 MG PO TABS
1.0000 | ORAL_TABLET | Freq: Four times a day (QID) | ORAL | 0 refills | Status: DC | PRN
Start: 2022-01-04 — End: 2022-09-30

## 2022-01-04 NOTE — Discharge Instructions (Signed)
Take the medications as needed for pain.  Follow-up with an orthopedic doctor for further evaluation return to the ED for fevers chills or other concerning symptoms ?

## 2022-01-04 NOTE — ED Provider Notes (Signed)
?Wendy Newman ?Provider Note ? ? ?CSN: [id number] ?Arrival date & time: 01/04/22  1520 ? ?  ? ?History ? ?Chief Complaint  ?Patient presents with  ? Knee Pain  ? ? ?[name] is a 81 y.o. female. ? ? ?Knee Pain ?Associated symptoms: no fever   ? ?  ?Patient has a history of hypertension osteoporosis DVT on chronic anticoagulation.  Patient presents to the ED for evaluation of knee pain that started about a week ago.  Patient states its been gradual in onset and has been persistent and gradually worsening.  Patient states it hurts for her to bend her knee.  She denies any falls or injuries.  No fevers or chills.  The pain is around the knee and not in her lower leg or upper leg. ?Home Medications ?Prior to Admission medications   ?Medication Sig Start Date End Date Taking? Authorizing Provider  ?HYDROcodone-acetaminophen (NORCO/VICODIN) 5-325 MG tablet Take 1 tablet by mouth every 6 (six) hours as needed. 01/04/22  Yes Linwood Dibbles, MD  ?alendronate (FOSAMAX) 70 MG tablet Take 1 tablet (70 mg total) by mouth every 7 (seven) days. Take with a full glass of water on an empty stomach. 03/26/21   Donita Brooks, MD  ?atorvastatin (LIPITOR) 20 MG tablet TAKE 1 TABLET DAILY 07/05/21   Donita Brooks, MD  ?cholecalciferol (VITAMIN D) 1000 UNITS tablet Take 1,000 Units by mouth daily.     [provider]  ?citalopram (CELEXA) 20 MG tablet TAKE 1 TABLET DAILY 01/22/21   Donita Brooks, MD  ?fluticasone (FLONASE) 50 MCG/ACT nasal spray Place 2 sprays into both nostrils daily. 05/22/21   Donita Brooks, MD  ?hydrochlorothiazide (HYDRODIURIL) 25 MG tablet TAKE 1 TABLET DAILY 01/22/21   Donita Brooks, MD  ?levocetirizine (XYZAL) 5 MG tablet TAKE 1 TABLET EVERY EVENING 09/16/21   Donita Brooks, MD  ?rivaroxaban (XARELTO) 20 MG TABS tablet Take 1 tablet (20 mg total) by mouth daily. 11/01/21   Donita Brooks, MD  ?RIVAROXABAN Carlena Hurl) VTE STARTER PACK (15 & 20 MG) Follow package  directions: Take one 15mg  tablet by mouth twice a day. On day 22, switch to one 20mg  tablet once a day. Take with food. 09/26/21   , MD  ?[facility] injection  07/22/21   [provider]  ?   ? ?Allergies    ?Patient has no known allergies.   ? ?Review of Systems   ?Review of Systems  ?Constitutional:  Negative for fever.  ? ?Physical Exam ?Updated Vital Signs ?BP (!) 149/72   Pulse 62   Temp 98 ?F (36.7 ?C)   Resp 16   Ht 1.676 m (5\' 6" )   Wt 82.1 kg   SpO2 100%   BMI 29.21 kg/m?  ?Physical Exam ?Vitals and nursing note reviewed.  ?Constitutional:   ?   General: She is not in acute distress. ?   Appearance: She is well-developed.  ?HENT:  ?   Head: Normocephalic and atraumatic.  ?   Right Ear: External ear normal.  ?   Left Ear: External ear normal.  ?Eyes:  ?   General: No scleral icterus.    ?   Right eye: No discharge.     ?   Left eye: No discharge.  ?   Conjunctiva/sclera: Conjunctivae normal.  ?Neck:  ?   Trachea: No tracheal deviation.  ?Cardiovascular:  ?   Rate and Rhythm: Normal rate.  ?Pulmonary:  ?  Effort: Pulmonary effort is normal. No respiratory distress.  ?   Breath sounds: No stridor.  ?Abdominal:  ?   General: There is no distension.  ?Musculoskeletal:     ?   General: No swelling or deformity.  ?   Cervical back: Neck supple.  ?   Right knee: Effusion and bony tenderness present. Decreased range of motion. Tenderness present. No medial joint line tenderness.  ?Skin: ?   General: Skin is warm and dry.  ?   Findings: No rash.  ?Neurological:  ?   Mental Status: She is alert.  ?   Cranial Nerves: Cranial nerve deficit: no gross deficits.  ? ? ?ED Results / Procedures / Treatments   ?Labs ?(all labs ordered are listed, but only abnormal results are displayed) ?Labs Reviewed - No data to display ? ?EKG ?None ? ?Radiology ?DG Knee Complete 4 Views Right ? ?Result Date: 01/04/2022 ?CLINICAL DATA:  atraumatic pain, swelling EXAM: RIGHT KNEE - COMPLETE 4+ VIEW COMPARISON:   08/25/2016 FINDINGS: No evidence of fracture, dislocation, or joint effusion. No evidence of arthropathy or other focal bone abnormality. Soft tissues are unremarkable. IMPRESSION: Negative. Electronically Signed   By: Duanne Guess D.O.   On: 01/04/2022 16:28   ? ?Procedures ?Procedures  ? ? ?Medications Ordered in ED ?Medications - No data to display ? ?ED Course/ Medical Decision Making/ A&P ?Clinical Course as of 01/05/22 1501  ?Wynelle Link Jan 05, 2022  ?1500 DG Knee Complete 4 Views Right ?Xray images and report reviewed.  No acute findings. [JK]  ?  ?Clinical Course User Index ?[JK] Linwood Dibbles, MD  ? ?                        ?Medical Decision Making ?Amount and/or Complexity of Data Reviewed ?Radiology: ordered. ? ?Risk ?Prescription drug management. ? ? ?Pt with atraumatic knee pain.  No signs of infection.  No calf tenderness.  Doubt dvt, or pVD.  Xrays without acute findings.  Will dc home with knee sleeve, outpt follow up with ortho. ? ? ? ? ? ? ? ?Final Clinical Impression(s) / ED Diagnoses ?Final diagnoses:  ?Acute pain of right knee  ? ? ?Rx / DC Orders ?ED Discharge Orders   ? ?      Ordered  ?  HYDROcodone-acetaminophen (NORCO/VICODIN) 5-325 MG tablet  Every 6 hours PRN       ? 01/04/22 1710  ? ?  ?  ? ?  ? ? ?  ?Linwood Dibbles, MD ?01/05/22 1501 ? ?

## 2022-01-04 NOTE — ED Triage Notes (Signed)
Patient c/o right knee pain and swelling that started a week ago. Patient denies any known injury. Patient taking tylenol with no relief. Per patient hx of DVT in left leg in which she takes xarelto.  ?

## 2022-01-08 ENCOUNTER — Ambulatory Visit (INDEPENDENT_AMBULATORY_CARE_PROVIDER_SITE_OTHER): Payer: Medicare Other | Admitting: Orthopedic Surgery

## 2022-01-08 ENCOUNTER — Other Ambulatory Visit: Payer: Self-pay

## 2022-01-08 ENCOUNTER — Encounter: Payer: Self-pay | Admitting: Orthopedic Surgery

## 2022-01-08 DIAGNOSIS — M25561 Pain in right knee: Secondary | ICD-10-CM | POA: Diagnosis not present

## 2022-01-08 NOTE — Patient Instructions (Signed)
Voltaren gel for your knee.  Ice the knee.  Continue with tylenol as needed ? ?Instructions Following Joint Injections ? ?In clinic today, you received an injection in one of your joints (sometimes more than one).  Occasionally, you can have some pain at the injection site, this is normal.  You can place ice at the injection site, or take over-the-counter medications such as Tylenol (acetaminophen) or Advil (ibuprofen).  Please follow all directions listed on the bottle. ? ?If your joint (knee or shoulder) becomes swollen, red or very painful, please contact the clinic for additional assistance.  ? ?Two medications were injected, including lidocaine and a steroid (often referred to as cortisone).  Lidocaine is effective almost immediately but wears off quickly.  However, the steroid can take a few days to improve your symptoms.  In some cases, it can make your pain worse for a couple of days.  Do not be concerned if this happens as it is common.  You can apply ice or take some over-the-counter medications as needed.  ? ? ?Knee Exercises ? ?Ask your health care provider which exercises are safe for you. Do exercises exactly as told by your health care provider and adjust them as directed. It is normal to feel mild stretching, pulling, tightness, or discomfort as you do these exercises. Stop right away if you feel sudden pain or your pain gets worse. Do not begin these exercises until told by your health care provider. ? ?Stretching and range-of-motion exercises ?These exercises warm up your muscles and joints and improve the movement and flexibility of your knee. These exercises also help to relieve pain and swelling. ? ?Knee extension, prone ?Lie on your abdomen (prone position) on a bed. ?Place your left / right knee just beyond the edge of the surface so your knee is not on the bed. You can put a towel under your left / right thigh just above your kneecap for comfort. ?Relax your leg muscles and allow gravity to  straighten your knee (extension). You should feel a stretch behind your left / right knee. ?Hold this position for 10 seconds. ?Scoot up so your knee is supported between repetitions. ?Repeat 10 times. Complete this exercise 3-4 times per week. ?    ?Knee flexion, active ?Lie on your back with both legs straight. If this causes back discomfort, bend your left / right knee so your foot is flat on the floor. ?Slowly slide your left / right heel back toward your buttocks. Stop when you feel a gentle stretch in the front of your knee or thigh (flexion). ?Hold this position for 10 seconds. ?Slowly slide your left / right heel back to the starting position. ?Repeat 10 times. Complete this exercise 3-4 times per week. ?  ?   ?Quadriceps stretch, prone ?Lie on your abdomen on a firm surface, such as a bed or padded floor. ?Bend your left / right knee and hold your ankle. If you cannot reach your ankle or pant leg, loop a belt around your foot and grab the belt instead. ?Gently pull your heel toward your buttocks. Your knee should not slide out to the side. You should feel a stretch in the front of your thigh and knee (quadriceps). ?Hold this position for 10 seconds. ?Repeat 10 times. Complete this exercise 3-4 times per week. ?  ?   ?Hamstring, supine ?Lie on your back (supine position). ?Loop a belt or towel over the ball of your left / right foot. The ball of  your foot is on the walking surface, right under your toes. ?Straighten your left / right knee and slowly pull on the belt to raise your leg until you feel a gentle stretch behind your knee (hamstring). ?Do not let your knee bend while you do this. ?Keep your other leg flat on the floor. ?Hold this position for 10 seconds. ?Repeat 10 times. Complete this exercise 3-4 times per week. ? ? ?Strengthening exercises ?These exercises build strength and endurance in your knee. Endurance is the ability to use your muscles for a long time, even after they get  tired. ? ?Quadriceps, isometric ?This exercise stretches the muscles in front of your thigh (quadriceps) without moving your knee joint (isometric). ?Lie on your back with your left / right leg extended and your other knee bent. Put a rolled towel or small pillow under your knee if told by your health care provider. ?Slowly tense the muscles in the front of your left / right thigh. You should see your kneecap slide up toward your hip or see increased dimpling just above the knee. This motion will push the back of the knee toward the floor. ?For 10 seconds, hold the muscle as tight as you can without increasing your pain. ?Relax the muscles slowly and completely. ?Repeat 10 times. Complete this exercise 3-4 times per week. ?.  ?   ?Straight leg raises ?This exercise stretches the muscles in front of your thigh (quadriceps) and the muscles that move your hips (hip flexors). ?Lie on your back with your left / right leg extended and your other knee bent. ?Tense the muscles in the front of your left / right thigh. You should see your kneecap slide up or see increased dimpling just above the knee. Your thigh may even shake a bit. ?Keep these muscles tight as you raise your leg 4-6 inches (10-15 cm) off the floor. Do not let your knee bend. ?Hold this position for 10 seconds. ?Keep these muscles tense as you lower your leg. ?Relax your muscles slowly and completely after each repetition. ?Repeat 10 times. Complete this exercise 3-4 times per week. ? ?Hamstring, isometric ?Lie on your back on a firm surface. ?Bend your left / right knee about 30 degrees. ?Dig your left / right heel into the surface as if you are trying to pull it toward your buttocks. Tighten the muscles in the back of your thighs (hamstring) to "dig" as hard as you can without increasing any pain. ?Hold this position for 10 seconds. ?Release the tension gradually and allow your muscles to relax completely for __________ seconds after each  repetition. ?Repeat 10 times. Complete this exercise 3-4 times per week. ? ?Hamstring curls ?If told by your health care provider, do this exercise while wearing ankle weights. Begin with 5 lb weights. Then increase the weight by 1 lb (0.5 kg) increments. You can also use an exercise band ?Lie on your abdomen with your legs straight. ?Bend your left / right knee as far as you can without feeling pain. Keep your hips flat against the floor. ?Hold this position for 10 seconds. ?Slowly lower your leg to the starting position. ?Repeat 10 times. Complete this exercise 3-4 times per week. ?  ?   ?Squats ?This exercise strengthens the muscles in front of your thigh and knee (quadriceps). ?Stand in front of a table, with your feet and knees pointing straight ahead. You may rest your hands on the table for balance but not for support. ?Slowly bend your  knees and lower your hips like you are going to sit in a chair. ?Keep your weight over your heels, not over your toes. ?Keep your lower legs upright so they are parallel with the table legs. ?Do not let your hips go lower than your knees. ?Do not bend lower than told by your health care provider. ?If your knee pain increases, do not bend as low. ?Hold the squat position for 10 seconds. ?Slowly push with your legs to return to standing. Do not use your hands to pull yourself to standing. ?Repeat 10 times. Complete this exercise 3-4 times per week ?. ?    ?Wall slides ?This exercise strengthens the muscles in front of your thigh and knee (quadriceps). ?Lean your back against a smooth wall or door, and walk your feet out 18-24 inches (46-61 cm) from it. ?Place your feet hip-width apart. ?Slowly slide down the wall or door until your knees bend 90 degrees. Keep your knees over your heels, not over your toes. Keep your knees in line with your hips. ?Hold this position for 10 seconds. ?Repeat 10 times. Complete this exercise 3-4 times per week. ?  ?   ?Straight leg raises ?This  exercise strengthens the muscles that rotate the leg at the hip and move it away from your body (hip abductors). ?Lie on your side with your left / right leg in the top position. Lie so your head, shoulder, knee, and hip line up. You may

## 2022-01-09 NOTE — Progress Notes (Signed)
New Patient Visit ? ?Assessment: ?Wendy Newman is a 81 y.o. female with the following: ?Acute right knee pain ? ?Plan: ?[name] has right knee pain.  Cute onset.  Is been painful for the past week.  She has noticed some swelling.  It feels like it wants to give way on her.  We discussed medications, bracing, exercises and injections.  She would like to proceed with an injection.  This was completed in clinic today.  I provided her with basic knee exercises.  She will follow-up in clinic as needed. ? ?Procedure note injection Right knee joint ?  ?Verbal consent was obtained to inject the right knee joint  ?Timeout was completed to confirm the site of injection.  The skin was prepped with alcohol and ethyl chloride was sprayed at the injection site.  ?A 21-gauge needle was used to inject 40 mg of Depo-Medrol and 1% lidocaine (3 cc) into the right knee using an anterolateral approach.  ?There were no complications. A sterile bandage was applied. ? ? ? ?Follow-up: ?Return if symptoms worsen or fail to improve. ? ?Subjective: ? ?Chief Complaint  ?Patient presents with  ? Knee Pain  ?  Right knee pain, NKI, pain x 1 week or so.  Swollen, acts like it wants to give way.  Taking meds from ED occasionally.   ? ? ?History of Present Illness: ?Wendy Newman is a 81 y.o. female who presents to clinic today for repeat evaluation of right knee pain.  She states she had pain in the medial aspect of the right knee for the past week.  No specific injury.  She has not injured this knee in the past.  She states that she has no swelling around the knee.  Occasionally, she feels as though it wants to "give way".  She was seen in the emergency department, provided with pain medications.  Otherwise, she is taking medications as needed at home.  No brace.  No prior injections. ? ? ?Review of Systems: ?No fevers or chills.   ?No numbness or tingling ?No chest pain ?No shortness of breath ?No bowel or bladder dysfunction ?No GI  distress ?No headaches ? ? ?Medical History: ? ?Past Medical History:  ?Diagnosis Date  ? Anxiety   ? Cataract   ? Edema of both legs   ? Essential hypertension   ? Osteoporosis   ? Vitamin D deficiency   ? ? ?Past Surgical History:  ?Procedure Laterality Date  ? ABDOMINAL HYSTERECTOMY    ? Complete  ? CESAREAN SECTION    ? X 3  ? COLONOSCOPY  10/10/2008  ? MEQ:[id number]  ? COLONOSCOPY WITH PROPOFOL N/A 12/21/2014  ? Dr. Rourk:noncompliant left colon and redundant right colon, rectal and colonic mucosa appeared normal.   ? ? ?Family History  ?Problem Relation Age of Onset  ? Cancer Mother 7  ?     Breast. age 66.   ? Colon polyps Mother   ? Heart disease Father 29  ?     CABG  ? Cancer Sister 64  ?     Lung  ? Cancer Brother 22  ?     Started in the back  ? Colon cancer Other   ?     Age 107s. maternal grandmother  ? ?Social History  ? ?Tobacco Use  ? Smoking status: Never  ? Smokeless tobacco: Never  ?Vaping Use  ? Vaping Use: Never used  ?Substance Use Topics  ? Alcohol use: No  ?  Alcohol/week: 0.0 standard drinks  ? Drug use: No  ? ? ?No Known Allergies ? ?Current Meds  ?Medication Sig  ? alendronate (FOSAMAX) 70 MG tablet Take 1 tablet (70 mg total) by mouth every 7 (seven) days. Take with a full glass of water on an empty stomach.  ? atorvastatin (LIPITOR) 20 MG tablet TAKE 1 TABLET DAILY  ? cholecalciferol (VITAMIN D) 1000 UNITS tablet Take 1,000 Units by mouth daily.   ? citalopram (CELEXA) 20 MG tablet TAKE 1 TABLET DAILY  ? fluticasone (FLONASE) 50 MCG/ACT nasal spray Place 2 sprays into both nostrils daily.  ? hydrochlorothiazide (HYDRODIURIL) 25 MG tablet TAKE 1 TABLET DAILY  ? HYDROcodone-acetaminophen (NORCO/VICODIN) 5-325 MG tablet Take 1 tablet by mouth every 6 (six) hours as needed.  ? levocetirizine (XYZAL) 5 MG tablet TAKE 1 TABLET EVERY EVENING  ? rivaroxaban (XARELTO) 20 MG TABS tablet Take 1 tablet (20 mg total) by mouth daily.  ? RIVAROXABAN (XARELTO) VTE STARTER PACK (15 & 20 MG) Follow  package directions: Take one 15mg  tablet by mouth twice a day. On day 22, switch to one 20mg  tablet once a day. Take with food.  ? SHINGRIX injection   ? ? ?Objective: ?There were no vitals taken for this visit. ? ?Physical Exam: ? ?General: Alert and oriented. and No acute distress. ?Gait: Right sided antalgic gait. ? ?Right knee with mild effusion.  No bruising is appreciated.  Tenderness to palpation within the popliteal fossa.  Tenderness palpation along medial joint line.  Negative Lachman.  No increased laxity to varus and valgus stress.  Range of motion from 0-1 20. ? ?IMAGING: ?I personally reviewed images previously obtained from the ED ? ?X-rays from the emergency department are without acute injury.  Neutral overall alignment.  Well-maintained joint spaces. ? ?New Medications:  ?No orders of the defined types were placed in this encounter. ? ? ? ? ? , MD ? ?01/09/2022 ?1:26 PM ? ? ?

## 2022-01-13 ENCOUNTER — Ambulatory Visit (INDEPENDENT_AMBULATORY_CARE_PROVIDER_SITE_OTHER): Payer: Medicare Other | Admitting: Family Medicine

## 2022-01-13 ENCOUNTER — Other Ambulatory Visit: Payer: Self-pay | Admitting: Family Medicine

## 2022-01-13 ENCOUNTER — Other Ambulatory Visit: Payer: Self-pay

## 2022-01-13 VITALS — BP 130/82 | HR 61 | Temp 97.1°F | Ht 66.0 in | Wt 180.8 lb

## 2022-01-13 DIAGNOSIS — E7800 Pure hypercholesterolemia, unspecified: Secondary | ICD-10-CM

## 2022-01-13 DIAGNOSIS — I10 Essential (primary) hypertension: Secondary | ICD-10-CM | POA: Diagnosis not present

## 2022-01-13 MED ORDER — RIVAROXABAN 20 MG PO TABS: 20.0000 mg | ORAL_TABLET | Freq: Every day | ORAL | 0 refills | Status: DC

## 2022-01-13 NOTE — Progress Notes (Signed)
? ?Subjective:  ? ? Patient ID: Wendy Newman, female    DOB: [birth year], 81 y.o.   MRN: [id number] ? ?HPI ?Patient is a very sweet 81 year old Caucasian female who is here today for checkup.  In December of last year she was diagnosed with a DVT.  She has completed 3 months of oral anticoagulation.  We have discussed this further and she would like to complete 6 months total.  Therefore she needs an additional 3 months of anticoagulation.  Otherwise she is doing well.  Her mammogram is up-to-date.  Her immunizations are up-to-date.  She is due for fasting lab work.  She denies any chest pain shortness of breath or dyspnea on exertion.  Her blood pressure today is excellent at 130/82 ? ?Past Medical History:  ?Diagnosis Date  ? Anxiety   ? Cataract   ? Edema of both legs   ? Essential hypertension   ? Osteoporosis   ? Vitamin D deficiency   ? ?Past Surgical History:  ?Procedure Laterality Date  ? ABDOMINAL HYSTERECTOMY    ? Complete  ? CESAREAN SECTION    ? X 3  ? COLONOSCOPY  10/10/2008  ? WUJ:[id number]:incomplete  ? COLONOSCOPY WITH PROPOFOL N/A 12/21/2014  ? Dr. Rourk:noncompliant left colon and redundant right colon, rectal and colonic mucosa appeared normal.   ? ?Current Outpatient Medications on File Prior to Visit  ?Medication Sig Dispense Refill  ? alendronate (FOSAMAX) 70 MG tablet Take 1 tablet (70 mg total) by mouth every 7 (seven) days. Take with a full glass of water on an empty stomach. 4 tablet 11  ? atorvastatin (LIPITOR) 20 MG tablet TAKE 1 TABLET DAILY 90 tablet 3  ? cholecalciferol (VITAMIN D) 1000 UNITS tablet Take 1,000 Units by mouth daily.     ? citalopram (CELEXA) 20 MG tablet TAKE 1 TABLET DAILY 90 tablet 3  ? fluticasone (FLONASE) 50 MCG/ACT nasal spray Place 2 sprays into both nostrils daily. 48 g 0  ? hydrochlorothiazide (HYDRODIURIL) 25 MG tablet TAKE 1 TABLET DAILY 90 tablet 3  ? HYDROcodone-acetaminophen (NORCO/VICODIN) 5-325 MG tablet Take 1 tablet by mouth every 6 (six) hours as needed. 12  tablet 0  ? levocetirizine (XYZAL) 5 MG tablet TAKE 1 TABLET EVERY EVENING 30 tablet 3  ? SHINGRIX injection     ? ?No current facility-administered medications on file prior to visit.  ? ?No Known Allergies ?Social History  ? ?Socioeconomic History  ? Marital status: Married  ?  Spouse name: Not on file  ? Number of children: 3  ? Years of education: Not on file  ? Highest education level: Not on file  ?Occupational History  ? 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 Smoking status: Never  ? Smokeless tobacco: Never  ?Vaping Use  ? Vaping Use: Never used  ?Substance and Sexual Activity  ? Alcohol use: No  ?  Alcohol/week: 0.0 standard drinks  ? Drug use: No  ? Sexual activity: Yes  ?  Birth control/protection: Surgical  ?Other Topics Concern  ? Not on file  ?Social History Narrative  ? Not on file  ? ?Social Determinants of Health  ? ?Financial Resource Strain: Not on file  ?Food Insecurity: Not on file  ?Transportation Needs: Not on file  ?Physical Activity: Not on file  ?Stress: Not on file  ?Social Connections: Not on file  ?Intimate Partner Violence: Not on file  ? ? ? ? ?Review of Systems  ?All other systems reviewed  and are negative. ? ?   ?Objective:  ? Physical Exam ?Vitals reviewed.  ?Constitutional:   ?   General: She is not in acute distress. ?   Appearance: Normal appearance. She is normal weight. She is not ill-appearing, toxic-appearing or diaphoretic.  ?HENT:  ?   Head: Normocephalic and atraumatic.  ?   Right Ear: Tympanic membrane, ear canal and external ear normal. There is no impacted cerumen.  ?   Left Ear: Tympanic membrane, ear canal and external ear normal. There is no impacted cerumen.  ?   Nose: No congestion or rhinorrhea.  ?   Mouth/Throat:  ?   Mouth: Mucous membranes are moist.  ?   Pharynx: Oropharynx is clear. No oropharyngeal exudate or posterior oropharyngeal erythema.  ?Eyes:  ?   General:     ?   Left eye: No discharge.  ?   Extraocular Movements: Extraocular  movements intact.  ?   Conjunctiva/sclera: Conjunctivae normal.  ?   Pupils: Pupils are equal, round, and reactive to light.  ?Neck:  ?   Vascular: No carotid bruit.  ?Cardiovascular:  ?   Rate and Rhythm: Normal rate and regular rhythm.  ?   Pulses: Normal pulses.  ?   Heart sounds: Normal heart sounds. No murmur heard. ?  No friction rub. No gallop.  ?Pulmonary:  ?   Effort: Pulmonary effort is normal. No respiratory distress.  ?   Breath sounds: Normal breath sounds. No stridor. No wheezing, rhonchi or rales.  ?Chest:  ?   Chest wall: No tenderness.  ?Abdominal:  ?   General: Abdomen is flat. Bowel sounds are normal. There is no distension.  ?   Palpations: Abdomen is soft.  ?   Tenderness: There is no abdominal tenderness. There is no guarding or rebound.  ?Musculoskeletal:     ?   General: Tenderness and deformity present. No swelling.  ?   Cervical back: Normal range of motion and neck supple. No rigidity or tenderness.  ?   Right lower leg: No edema.  ?   Left lower leg: No edema.  ?Lymphadenopathy:  ?   Cervical: No cervical adenopathy.  ?Skin: ?   Coloration: Skin is not jaundiced.  ?   Findings: No bruising, erythema or rash.  ?Neurological:  ?   General: No focal deficit present.  ?   Mental Status: She is alert and oriented to person, place, and time. Mental status is at baseline.  ?   Cranial Nerves: No cranial nerve deficit.  ?   Sensory: No sensory deficit.  ?   Motor: No weakness.  ?   Coordination: Coordination normal.  ?   Gait: Gait normal.  ?   Deep Tendon Reflexes: Reflexes normal.  ?Psychiatric:     ?   Mood and Affect: Mood normal.     ?   Behavior: Behavior normal.     ?   Thought Content: Thought content normal.     ?   Judgment: Judgment normal.  ? ? ? ? ? ?   ?Assessment & Plan:  ?Benign essential HTN - Plan: CBC with Differential/Platelet, Lipid panel, COMPLETE METABOLIC PANEL WITH GFR ? ?Pure hypercholesterolemia ?We will check fasting lab work including CBC CMP and lipid panel.  Blood  pressure today is excellent.  Immunizations and mammogram are up-to-date.  Complete an additional 3 months of oral anticoagulation for history of a DVT.  She will discontinue her anticoagulant in June.  I provided refills  for this.  Otherwise, regular anticipatory guidance is provided. ?

## 2022-01-14 LAB — COMPLETE METABOLIC PANEL WITH GFR
AG Ratio: 1.6 (calc) (ref 1.0–2.5)
ALT: 11 U/L (ref 6–29)
AST: 15 U/L (ref 10–35)
Albumin: 4.2 g/dL (ref 3.6–5.1)
Alkaline phosphatase (APISO): 60 U/L (ref 37–153)
BUN/Creatinine Ratio: 15 (calc) (ref 6–22)
BUN: 15 mg/dL (ref 7–25)
CO2: 25 mmol/L (ref 20–32)
Calcium: 9.3 mg/dL (ref 8.6–10.4)
Chloride: 104 mmol/L (ref 98–110)
Creat: 1.02 mg/dL — ABNORMAL HIGH (ref 0.60–1.00)
Globulin: 2.6 g/dL (calc) (ref 1.9–3.7)
Glucose, Bld: 85 mg/dL (ref 65–99)
Potassium: 4.1 mmol/L (ref 3.5–5.3)
Sodium: 139 mmol/L (ref 135–146)
Total Bilirubin: 0.5 mg/dL (ref 0.2–1.2)
Total Protein: 6.8 g/dL (ref 6.1–8.1)
eGFR: 56 mL/min/{1.73_m2} — ABNORMAL LOW (ref >=60)

## 2022-01-14 LAB — CBC WITH DIFFERENTIAL/PLATELET
Absolute Monocytes: 514 cells/uL (ref 200–950)
Basophils Absolute: 20 cells/uL (ref 0–200)
Basophils Relative: 0.3 %
Eosinophils Absolute: 72 cells/uL (ref 15–500)
Eosinophils Relative: 1.1 %
HCT: 39.4 % (ref 35.0–45.0)
Hemoglobin: 12.8 g/dL (ref 11.7–15.5)
Lymphs Abs: 2295 cells/uL (ref 850–3900)
MCH: 30.4 pg (ref 27.0–33.0)
MCHC: 32.5 g/dL (ref 32.0–36.0)
MCV: 93.6 fL (ref 80.0–100.0)
MPV: 11.8 fL (ref 7.5–12.5)
Monocytes Relative: 7.9 %
Neutro Abs: 3601 cells/uL (ref 1500–7800)
Neutrophils Relative %: 55.4 %
Platelets: 230 10*3/uL (ref 140–400)
RBC: 4.21 10*6/uL (ref 3.80–5.10)
RDW: 12.9 % (ref 11.0–15.0)
Total Lymphocyte: 35.3 %
WBC: 6.5 10*3/uL (ref 3.8–10.8)

## 2022-01-14 LAB — LIPID PANEL
Cholesterol: 135 mg/dL (ref <200)
HDL: 52 mg/dL (ref >=50)
LDL Cholesterol (Calc): 70 mg/dL (calc)
Non-HDL Cholesterol (Calc): 83 mg/dL (calc) (ref <130)
Total CHOL/HDL Ratio: 2.6 (calc) (ref <5.0)
Triglycerides: 53 mg/dL (ref <150)

## 2022-01-16 ENCOUNTER — Telehealth: Payer: Self-pay | Admitting: Family Medicine

## 2022-01-16 NOTE — Telephone Encounter (Signed)
Patient returned call for lab results. Please advise at [phone]. ?

## 2022-01-17 NOTE — Telephone Encounter (Signed)
Please see lab results for more information.   

## 2022-01-22 ENCOUNTER — Telehealth: Payer: Self-pay | Admitting: Family Medicine

## 2022-01-22 DIAGNOSIS — E7800 Pure hypercholesterolemia, unspecified: Secondary | ICD-10-CM

## 2022-01-22 MED ORDER — HYDROCHLOROTHIAZIDE 25 MG PO TABS: 25.0000 mg | ORAL_TABLET | Freq: Every day | ORAL | 3 refills | Status: DC

## 2022-01-22 MED ORDER — ATORVASTATIN CALCIUM 20 MG PO TABS: 20.0000 mg | ORAL_TABLET | Freq: Every day | ORAL | 3 refills | Status: DC

## 2022-01-22 MED ORDER — CITALOPRAM HYDROBROMIDE 20 MG PO TABS: 20.0000 mg | ORAL_TABLET | Freq: Every day | ORAL | 3 refills | Status: DC

## 2022-01-22 NOTE — Telephone Encounter (Signed)
Pharmacy faxed in a refill request for  ? ?rivaroxaban (XARELTO) 20 MG TABS tablet [id number]  ?  Order Details ?Dose: 20 mg Route: Oral Frequency: Daily  ?Dispense Quantity: 90 tablet Refills: 0   ?Indications of Use: Deep Vein Thrombosis, Pulmonary Embolism  ?     ?Sig: Take 1 tablet (20 mg total) by mouth daily.  ?     ?Start Date: 01/13/22 End Date: --  ? ?

## 2022-01-22 NOTE — Telephone Encounter (Signed)
Received eFax from pharmacy to request refills of ? ?hydrochlorothiazide (HYDRODIURIL) 25 MG tablet [id number]  ? ?citalopram (CELEXA) 20 MG tablet [id number]  ? ?atorvastatin (LIPITOR) 20 MG tablet [id number] ? ? ?Efax received from: ? ?[facility] ? ?Please advise pharmacist at [phone]; their fax number is [phone] ?

## 2022-02-13 ENCOUNTER — Telehealth: Payer: Self-pay

## 2022-02-13 MED ORDER — HYDROCHLOROTHIAZIDE 25 MG PO TABS: 25.0000 mg | ORAL_TABLET | Freq: Every day | ORAL | 3 refills | Status: DC

## 2022-02-13 MED ORDER — FLUTICASONE PROPIONATE 50 MCG/ACT NA SUSP: 2.0000 | Freq: Every day | NASAL | 0 refills | Status: DC

## 2022-02-13 NOTE — Telephone Encounter (Signed)
Pharmacy faxed refill request for  ? ?citalopram (CELEXA) 20 MG tablet [id number]  ?  Order Details ?Dose: 20 mg Route: Oral Frequency: Daily  ?Dispense Quantity: 90 tablet Refills: 3   ?     ?Sig: Take 1 tablet (20 mg total) by mouth daily.  ?     ?Start Date: 01/22/22 End Date: --  ?Written Date: 01/22/22 Expiration Date: 01/22/23  ?Original Order:  citalopram (CELEXA) 20 MG tablet [id number]  ? ?

## 2022-02-13 NOTE — Telephone Encounter (Signed)
Pharmacy faxed a refill request for these meds ? ?hydrochlorothiazide (HYDRODIURIL) 25 MG tablet [[id number]]  ?  Order Details ?Dose: 25 mg Route: Oral Frequency: Daily  ?Dispense Quantity: 90 tablet Refills: 3   ?     ?Sig: Take 1 tablet (25 mg total) by mouth daily.  ?     ?Start Date: 01/22/22 End Date: --  ?Written Date: 01/22/22 Expiration Date: 01/22/23  ?Original Order:  hydrochlorothiazide (HYDRODIURIL) 25 MG tablet [[id number]]  ? ? ?atorvastatin (LIPITOR) 20 MG tablet [[id number]]  ?  Order Details ?Dose: 20 mg Route: Oral Frequency: Daily  ?Dispense Quantity: 90 tablet Refills: 3   ?     ?Sig: Take 1 tablet (20 mg total) by mouth daily.  ?     ?Start Date: 01/22/22 End Date: --  ?Written Date: 01/22/22 Expiration Date: 01/22/23  ?   ?Diagnosis Association: Elevated cholesterol (E78.00)  ?Original Order:  atorvastatin (LIPITOR) 20 MG tablet [[id number]]  ? ?

## 2022-02-13 NOTE — Telephone Encounter (Signed)
fluticasone (FLONASE) 50 MCG/ACT nasal spray [[id number]]  ?  Order Details ?Dose: 2 spray Route: Each Nare Frequency: Daily  ?Dispense Quantity: 48 g Refills: 0   ?     ?Sig: Place 2 sprays into both nostrils daily.  ?     ?Start Date: 05/22/21 End Date: --  ?Written Date: 05/22/21 Expiration Date: 05/22/22  ?Original Order:  fluticasone (FLONASE) 50 MCG/ACT nasal spray [[id number]]  ? ?

## 2022-02-13 NOTE — Telephone Encounter (Signed)
Pharmacy faxed refill request for  ? ?citalopram (CELEXA) 20 MG tablet [[id number]]  ?  Order Details ?Dose: 20 mg Route: Oral Frequency: Daily  ?Dispense Quantity: 90 tablet Refills: 3   ?     ?Sig: Take 1 tablet (20 mg total) by mouth daily.  ?     ?Start Date: 01/22/22 End Date: --  ?Written Date: 01/22/22 Expiration Date: 01/22/23  ?Original Order:  citalopram (CELEXA) 20 MG tablet [[id number]]  ? ?

## 2022-02-28 ENCOUNTER — Other Ambulatory Visit: Payer: Self-pay

## 2022-02-28 DIAGNOSIS — E7800 Pure hypercholesterolemia, unspecified: Secondary | ICD-10-CM

## 2022-02-28 MED ORDER — ATORVASTATIN CALCIUM 20 MG PO TABS: 20.0000 mg | ORAL_TABLET | Freq: Every day | ORAL | 2 refills | Status: DC

## 2022-02-28 MED ORDER — ALENDRONATE SODIUM 70 MG PO TABS: 70.0000 mg | ORAL_TABLET | ORAL | 2 refills | Status: DC

## 2022-02-28 MED ORDER — FLUTICASONE PROPIONATE 50 MCG/ACT NA SUSP: 2.0000 | Freq: Every day | NASAL | 2 refills | Status: DC

## 2022-02-28 MED ORDER — LEVOCETIRIZINE DIHYDROCHLORIDE 5 MG PO TABS: ORAL_TABLET | ORAL | 2 refills | Status: DC

## 2022-02-28 MED ORDER — CITALOPRAM HYDROBROMIDE 20 MG PO TABS: 20.0000 mg | ORAL_TABLET | Freq: Every day | ORAL | 0 refills | Status: DC

## 2022-02-28 NOTE — Telephone Encounter (Signed)
Requested Prescriptions  ?Pending Prescriptions Disp Refills  ?? fluticasone (FLONASE) 50 MCG/ACT nasal spray 48 g 2  ?  Sig: Place 2 sprays into both nostrils daily.  ?  ? Ear, Nose, and Throat: Nasal Preparations - Corticosteroids Passed - 02/28/2022  4:11 PM  ?  ?  Passed - Valid encounter within last 12 months  ?  Recent Outpatient Visits   ?      ? 1 month ago Benign essential HTN  ? [facility] Pickard, Cammie Mcgee, MD  ? 4 months ago Acute deep vein thrombosis (DVT) of popliteal vein of left lower extremity ([place])  ? [place], Jessica A, NP  ? 7 months ago Pure hypercholesterolemia  ? [facility] Pickard, Cammie Mcgee, MD  ? 11 months ago Ptosis of right eyelid  ? [facility] Pickard, Cammie Mcgee, MD  ? 1 year ago Pure hypercholesterolemia  ? [facility] Pickard, Cammie Mcgee, MD  ?  ?  ?Future Appointments   ?        ? In 4 months Pickard, Cammie Mcgee, MD [place], [facility]  ?  ? ?  ?  ?  ?? levocetirizine (XYZAL) 5 MG tablet 90 tablet 2  ?  Sig: TAKE 1 TABLET BY MOUTH EVERY DAY IN THE EVENING  ?  ? Ear, Nose, and Throat:  Antihistamines - levocetirizine dihydrochloride Failed - 02/28/2022  4:11 PM  ?  ?  Failed - Cr in normal range and within 360 days  ?  Creat  ?Date Value Ref Range Status  ?01/13/2022 1.02 (H) 0.60 - 1.00 mg/dL Final  ?   ?  ?  Passed - eGFR is 10 or above and within 360 days  ?  GFR, Est African American  ?Date Value Ref Range Status  ?12/14/2020 68 > OR = 60 mL/min/1.35m Final  ? ?GFR, Est Non African American  ?Date Value Ref Range Status  ?12/14/2020 59 (L) > OR = 60 mL/min/1.782mFinal  ? ?eGFR  ?Date Value Ref Range Status  ?01/13/2022 56 (L) > OR = 60 mL/min/1.7333minal  ?  Comment:  ?  The eGFR is based on the CKD-EPI 2021 equation. To calculate  ?the new eGFR from a previous Creatinine or Cystatin C ?result, go to https://www.kidney.org/professionals/ ?kdoqi/gfr%5Fcalculator ?  ?   ?  ?   Passed - Valid encounter within last 12 months  ?  Recent Outpatient Visits   ?      ? 1 month ago [facility]ign essential HTN  ? [facility] [place] WarCammie McgeeD  ? 4 months ago Acute deep vein thrombosis (DVT) of popliteal vein of left lower extremity ([place]? BroEvelethessica A, NP  ? 7 months ago [facility]olemia  ? [facility] [place], WarCammie McgeeD  ? 11 months ago [facility] of right eyelid  ? [facility] WarCammie McgeeD  ? 1 year ago [facility]cholesterolemia  ? [facility], WarCammie McgeeD  ?  ?  ?Future Appointments   ?        ? In 4 months Pickard, WarCammie McgeeD [facility]  ?  ? ?  ?  ?  ?? alendronate (FOSAMAX) 70 MG tablet 12 tablet 2  ?  Sig: Take 1 tablet (70 mg total) by mouth every 7 (seven) days.  Take with a full glass of water on an empty stomach.  ?  ? Endocrinology:  Bisphosphonates Failed - 02/28/2022  4:11 PM  ?  ?  Failed - Vitamin D in normal range and within 360 days  ?  Vit D, 25-Hydroxy  ?Date Value Ref Range Status  ?06/25/2015 42 30 - 100 ng/mL Final  ?  Comment:  ?  Vitamin D Status           25-OH Vitamin D ?       Deficiency                <20 ng/mL ?       Insufficiency         20 - 29 ng/mL ?       Optimal             > or = 30 ng/mL ?  ?For 25-OH Vitamin D testing on patients on D2-supplementation and ?patients for whom quantitation of D2 and D3 fractions is required, the ?Quest[employer]ureD 25-OH VIT D, (D2,D3), LC/MS/MS is recommended: order code ?85814 (patients > 2 yrs). ?  ?Footnotes:  ?(1) ?** Please note change in unit of measure and reference range(s). ** ?  ?  ?   ?  ?  Failed - Cr in normal range and within 360 days  ?  Creat  ?Date Value Ref Range Status  ?01/13/2022 1.02 (H) 0.60 - 1.00 mg/dL Final  ?   ?  ?  Failed - Mg Level in normal range and within 360 days  ?  No results found for: MG   ?  ?  Failed - Phosphate in normal range and within 360  days  ?  No results found for: PHOS   ?  ?  Passed - Ca in normal range and within 360 days  ?  Calcium  ?Date Value Ref Range Status  ?01/13/2022 9.3 8.6 - 10.4 mg/dL Final  ?   ?  ?  Passed - eGFR is 30 or above and within 360 days  ?  GFR, Est African American  ?Date Value Ref Range Status  ?12/14/2020 68 > OR = 60 mL/min/1.43m Final  ? ?GFR, Est Non African American  ?Date Value Ref Range Status  ?12/14/2020 59 (L) > OR = 60 mL/min/1.733mFinal  ? ?eGFR  ?Date Value Ref Range Status  ?01/13/2022 56 (L) > OR = 60 mL/min/1.7389minal  ?  Comment:  ?  The eGFR is based on the CKD-EPI 2021 equation. To calculate  ?the new eGFR from a previous Creatinine or Cystatin C ?result, go to https://www.kidney.org/professionals/ ?kdoqi/gfr%5Fcalculator ?  ?   ?  ?  Passed - Valid encounter within last 12 months  ?  Recent Outpatient Visits   ?      ? 1 month ago Benign essential HTN  ? [facility] [place] WarCammie McgeeD  ? 4 months ago Acute deep vein thrombosis (DVT) of popliteal vein of left lower extremity ([place]? [place] A, NP  ? 7 months ago Pure hypercholesterolemia  ? [facility] WarCammie McgeeD  ? 11 months ago Ptosis of right eyelid  ? [facility] [place], WarCammie McgeeD  ? 1 year ago Pure [facility]cholesterolemia  ? [facility] [place], WarCammie McgeeD  ?  ?  ?Future Appointments   ?        ? In  4 months Susy Frizzle, MD [place], [facility]  ?  ? ?  ?  ?  Passed - Bone Mineral Density or Dexa Scan completed in the last 2 years  ?  ?  ?? atorvastatin (LIPITOR) 20 MG tablet 90 tablet 2  ?  Sig: Take 1 tablet (20 mg total) by mouth daily.  ?  ? Cardiovascular:  Antilipid - Statins Failed - 02/28/2022  4:11 PM  ?  ?  Failed - Lipid Panel in normal range within the last 12 months  ?  Cholesterol  ?Date Value Ref Range Status  ?01/13/2022 135 <200 mg/dL Final  ? ?LDL Cholesterol (Calc)  ?Date Value Ref  Range Status  ?01/13/2022 70 mg/dL (calc) Final  ?  Comment:  ?  Reference range: <100 ?Marland Kitchen ?Desirable range <100 mg/dL for primary prevention;   ?<70 mg/dL for patients with CHD or diabetic patients  ?with > or = 2 CHD risk factors. ?. ?LDL-C is now calculated using the Martin-Hopkins  ?calculation, which is a validated novel method providing  ?better accuracy than the Friedewald equation in the  ?estimation of LDL-C.  ?Cresenciano Genre et al. Annamaria Helling. 6712;458(09): 2061-2068  ?(http://education.QuestDiagnostics.com/faq/FAQ164) ?  ? ?HDL  ?Date Value Ref Range Status  ?01/13/2022 52 > OR = 50 mg/dL Final  ? ?Triglycerides  ?Date Value Ref Range Status  ?01/13/2022 53 <150 mg/dL Final  ? ?  ?  ?  Passed - Patient is not pregnant  ?  ?  Passed - Valid encounter within last 12 months  ?  Recent Outpatient Visits   ?      ? 1 month ago Benign essential HTN  ? [facility] Pickard, Cammie Mcgee, MD  ? 4 months ago Acute deep vein thrombosis (DVT) of popliteal vein of left lower extremity ([place])  ? [place], Jessica A, NP  ? 7 months ago Pure hypercholesterolemia  ? [facility] [place], Cammie Mcgee, MD  ? 11 months ago Ptosis of right eyelid  ? [facility] Pickard, Cammie Mcgee, MD  ? 1 year ago Pure hypercholesterolemia  ? [facility] Pickard, Cammie Mcgee, MD  ?  ?  ?Future Appointments   ?        ? In 4 months Pickard, Cammie Mcgee, MD [place], [facility]  ?  ? ?  ?  ?  ?? citalopram (CELEXA) 20 MG tablet 90 tablet 0  ?  Sig: Take 1 tablet (20 mg total) by mouth daily.  ?  ? Psychiatry:  Antidepressants - SSRI Passed - 02/28/2022  4:11 PM  ?  ?  Passed - Valid encounter within last 6 months  ?  Recent Outpatient Visits   ?      ? 1 month ago Benign essential HTN  ? [facility] Pickard, Cammie Mcgee, MD  ? 4 months ago Acute deep vein thrombosis (DVT) of popliteal vein of left lower extremity ([place])  ? [place], Jessica A, NP  ? 7 months ago Pure hypercholesterolemia  ? [facility] Pickard, Cammie Mcgee, MD  ? 11 months ago Ptosis of right eyelid  ? [place]

## 2022-02-28 NOTE — Telephone Encounter (Signed)
Pharmacy faxed a refill request for ?fluticasone (FLONASE) 50 MCG/ACT nasal spray [[id number]]  ?  Order Details ?Dose: 2 spray Route: Each Nare Frequency: Daily  ?Dispense Quantity: 48 g Refills: 0   ?     ?Sig: Place 2 sprays into both nostrils daily.  ?     ?Start Date: 02/13/22 End Date: --  ?Written Date: 02/13/22 Expiration Date: 02/13/23  ?levocetirizine (XYZAL) 5 MG tablet [[id number]]  ?  Order Details ?Dose, Route, Frequency: As Directed  ?Dispense Quantity: 90 tablet Refills: 1   ?     ?Sig: TAKE 1 TABLET BY MOUTH EVERY DAY IN THE EVENING  ?     ?Start Date: 01/14/22 End Date: --  ?Written Date: 01/14/22 Expiration Date: 01/14/23  ?alendronate (FOSAMAX) 70 MG tablet [[id number]]  ?  Order Details ?Dose: 70 mg Route: Oral Frequency: Every 7 days  ?Dispense Quantity: 4 tablet Refills: 11   ?     ?Sig: Take 1 tablet (70 mg total) by mouth every 7 (seven) days. Take with a full glass of water on an empty stomach.  ?     ?Start Date: 03/26/21 End Date: --  ?Written Date: 03/26/21 Expiration Date: 03/26/22  ?atorvastatin (LIPITOR) 20 MG tablet [[id number]]  ?  Order Details ?Dose: 20 mg Route: Oral Frequency: Daily  ?Dispense Quantity: 90 tablet Refills: 3   ?     ?Sig: Take 1 tablet (20 mg total) by mouth daily.  ?     ?Start Date: 01/22/22 End Date: --  ?Written Date: 01/22/22 Expiration Date: 01/22/23  ?citalopram (CELEXA) 20 MG tablet [[id number]]  ?  Order Details ?Dose: 20 mg Route: Oral Frequency: Daily  ?Dispense Quantity: 90 tablet Refills: 3   ?     ?Sig: Take 1 tablet (20 mg total) by mouth daily.  ?     ?Start Date: 01/22/22 End Date: --  ?Written Date: 01/22/22 Expiration Date: 01/22/23  ?Original Order:  citalopram (CELEXA) 20 MG tablet   ? ?

## 2022-03-12 ENCOUNTER — Telehealth: Payer: Self-pay

## 2022-03-12 ENCOUNTER — Ambulatory Visit: Payer: Self-pay

## 2022-03-12 NOTE — Telephone Encounter (Signed)
[facility] called in to get PA for hydrochlorothiazide (HYDRODIURIL) 25 MG for this med for pt. Please advise. ? ? ? ?

## 2022-03-12 NOTE — Telephone Encounter (Signed)
?  Chief Complaint: tick bite poss. Incomplete removal, rash ?Symptoms: just to the right of navel , nickel sized rash around where tick was found. Notes black spot in middle where tick was attached ?Frequency: Tick removed Sunday ?Pertinent Negatives: Patient denies fever, rash elsewhere, joint pain or Headache ?Disposition: [] ED /[x] Urgent Care (no appt availability in office) / [] Appointment(In office/virtual)/ []  [facility]/ [] Home Care/ [] Refused Recommended Disposition /[] [place] [facility]/ []  Follow-up with PCP ?Additional Notes: unable to find appt with PCP- advised pt to go to an UC. Pt stated she may go to an ED "because it is cheaper" ? ? ? ? ? ? ? ?Reason for Disposition ? [1] Red or very tender (to touch) area AND [2] started over 24 hours after the bite ? ?Answer Assessment - Initial Assessment Questions ?1. TYPE of TICK: "Is it a wood tick or a deer tick?" (e.g., deer tick, wood tick; unsure) ?    Deer tick ?2. SIZE of TICK: "How big is the tick?" (e.g., size of poppy seed, apple seed, watermelon seed; unsure) Note: Deer ticks can be the size of a poppy seed (nymph) or an apple seed (adult).   ?    small ?3. ENGORGED: "Did the tick look flat or engorged (full, swollen)?" (e.g., flat, engorged; unsure) ?    flat ?4. LOCATION: "Where is the tick bite located?"  ?    Right of navel ?5. ONSET: "How long do you think the tick was attached before you removed it?" (e.g., 5 hours, 2 days)  ?    unsure ?6. APPEARANCE of BITE or RASH: "What does the site look like?" ?    Black in middle and redness nickle ?7. PREGNANCY: "Is there any chance you are pregnant?" "When was your last menstrual period?" ?    N/a ? ?Protocols used: Tick Bite-A-AH ? ?

## 2022-03-12 NOTE — Telephone Encounter (Signed)
ATC patient to offer OV as we have openings on Friday. ?LMTRC ?

## 2022-03-14 NOTE — Telephone Encounter (Signed)
Can you try to call pt for OV, please. For the following reason.  Thank you  Chief Complaint: tick bite poss. Incomplete removal, rash Symptoms: just to the right of navel , nickel sized rash around where tick was found. Notes black spot in middle where tick was attachedry to call pt to make any OV, please.

## 2022-03-18 ENCOUNTER — Encounter: Payer: Self-pay | Admitting: Orthopedic Surgery

## 2022-03-18 ENCOUNTER — Ambulatory Visit (INDEPENDENT_AMBULATORY_CARE_PROVIDER_SITE_OTHER): Payer: Medicare Other | Admitting: Orthopedic Surgery

## 2022-03-18 DIAGNOSIS — M25561 Pain in right knee: Secondary | ICD-10-CM | POA: Diagnosis not present

## 2022-03-18 DIAGNOSIS — M7121 Synovial cyst of popliteal space [Baker], right knee: Secondary | ICD-10-CM

## 2022-03-18 NOTE — Progress Notes (Signed)
Return patient Visit  Assessment: Wendy Newman is a 81 y.o. female with the following: Acute right knee pain Baker's cyst  Plan: Wendy Newman continues to have right knee pain.  Injection provided some relief of her symptoms.  However, the pain has returned.  She is complaining of pain in the posterior aspect of the right knee.  This is her primary complaint.  I recommended a referral to Dr. Doreatha Martin for ultrasound evaluation, aspiration and possible injection.  Pending the efficacy of this injection, we could consider obtaining an MRI.  I will see her back after the procedure, approximately 8 weeks.    Follow-up: Return in about 8 weeks (around 05/13/2022).  Subjective:  Chief Complaint  Patient presents with   Knee Pain    RT/ still hurting and states she still has place on back of knee and it is painful to walk    History of Present Illness: Wendy Newman is a 81 y.o. female who returns to clinic for repeat evaluation of her right knee.  I saw her in clinic a couple months ago.  We injected her right knee, and this provided some relief of her symptoms.  However, she continues to have pain.  She is complaining of pain primarily in the posterior aspect of the knee.  No other changes to her symptoms.  Review of Systems: No fevers or chills.   No numbness or tingling No chest pain No shortness of breath No bowel or bladder dysfunction No GI distress No headaches Objective: There were no vitals taken for this visit.  Physical Exam:  General: Alert and oriented. and No acute distress. Gait: Right sided antalgic gait.  Right knee without swelling.  She has a Baker's cyst, which is tender to palpation.  Mild tenderness to palpation on the medial joint line.  Negative Lachman.  No increased laxity varus or valgus stress.  IMAGING: I personally reviewed images previously obtained from the ED  No new imaging obtained today  New Medications:  No orders of the  defined types were placed in this encounter.     Mordecai Rasmussen, MD  03/18/2022 10:43 PM

## 2022-03-18 NOTE — Patient Instructions (Signed)
Recommended ultrasound-guided aspiration, and steroid injection for the Baker's cyst in your right knee.  Continue with medications as needed, including Tylenol.  Can consider using Voltaren gel, which is available over-the-counter.  After you have had the procedure for the Baker's cyst, if you continue to have issues, please contact the clinic.  As discussed in clinic, if you continue to have pain in the knee, we may have to consider obtaining an MRI.

## 2022-03-26 ENCOUNTER — Other Ambulatory Visit: Payer: Self-pay | Admitting: Family Medicine

## 2022-03-26 NOTE — Telephone Encounter (Signed)
Rx request of Fosamax from local pharmacy. Prescription was sent to Mail order. Called pt and she affirmed that Fosamax comes form mail order and is not out of it. Will refuse. Request.  Pt stated she has not received her HCTZ. Advised pt that med was also called  to Mail order. Pt stated it hasn't arrived. Advised pt to call mail order pharmacy to let them know. Pt stated she is not out of HCTZ but just has not received the med.

## 2022-03-31 DIAGNOSIS — M25561 Pain in right knee: Secondary | ICD-10-CM | POA: Diagnosis not present

## 2022-04-01 ENCOUNTER — Other Ambulatory Visit: Payer: Self-pay

## 2022-04-01 MED ORDER — HYDROCHLOROTHIAZIDE 25 MG PO TABS: 25.0000 mg | ORAL_TABLET | Freq: Every day | ORAL | 3 refills | Status: DC

## 2022-04-01 NOTE — Telephone Encounter (Signed)
Pharmacy faxed a refill request for hydrochlorothiazide (HYDRODIURIL) 25 MG tablet [[id number]]    Order Details Dose: 25 mg Route: Oral Frequency: Daily  Dispense Quantity: 90 tablet Refills: 3        Sig: Take 1 tablet (25 mg total) by mouth daily.       Start Date: 02/13/22 End Date: --  Written Date: 02/13/22 Expiration Date: 02/13/23

## 2022-04-01 NOTE — Telephone Encounter (Signed)
Requested Prescriptions  Pending Prescriptions Disp Refills  . hydrochlorothiazide (HYDRODIURIL) 25 MG tablet 90 tablet 3    Sig: Take 1 tablet (25 mg total) by mouth daily.     Cardiovascular: Diuretics - Thiazide Failed - 04/01/2022 11:16 AM      Failed - Cr in normal range and within 180 days    Creat  Date Value Ref Range Status  01/13/2022 1.02 (H) 0.60 - 1.00 mg/dL Final         Passed - K in normal range and within 180 days    Potassium  Date Value Ref Range Status  01/13/2022 4.1 3.5 - 5.3 mmol/L Final         Passed - Na in normal range and within 180 days    Sodium  Date Value Ref Range Status  01/13/2022 139 135 - 146 mmol/L Final         Passed - Last BP in normal range    BP Readings from Last 1 Encounters:  01/13/22 130/82         Passed - Valid encounter within last 6 months    Recent Outpatient Visits          2 months ago Benign essential HTN   [facility] Donita Brooks, MD   6 months ago Acute deep vein thrombosis (DVT) of popliteal vein of left lower extremity (HCC)   [facility] Valentino Nose, NP   8 months ago Pure hypercholesterolemia   [facility] Pickard, Priscille Heidelberg, MD   1 year ago Ptosis of right eyelid   [facility] Pickard, Priscille Heidelberg, MD   1 year ago Pure hypercholesterolemia   [facility] Pickard, Priscille Heidelberg, MD      Future Appointments            In 3 months Pickard, Priscille Heidelberg, MD [facility], [facility]

## 2022-04-21 ENCOUNTER — Telehealth: Payer: Self-pay

## 2022-04-24 ENCOUNTER — Other Ambulatory Visit: Payer: Self-pay | Admitting: Family Medicine

## 2022-04-25 NOTE — Telephone Encounter (Signed)
Refilled 02/28/2022 #[id number] [phone] refills. Requested Prescriptions  Pending Prescriptions Disp Refills  . citalopram (CELEXA) 20 MG tablet [Pharmacy Med Name: Citalopram Hydrobromide 20 MG Oral Tablet] 90 tablet 3    Sig: TAKE 1 TABLET BY MOUTH DAILY     Psychiatry:  Antidepressants - SSRI Passed - 04/24/2022 11:15 PM      Passed - Valid encounter within last 6 months    Recent Outpatient Visits          3 months ago Benign essential HTN   [facility] Donita Brooks, MD   6 months ago Acute deep vein thrombosis (DVT) of popliteal vein of left lower extremity (HCC)   [facility] Valentino Nose, NP   9 months ago Pure hypercholesterolemia   [facility] Medicine Pickard, Priscille Heidelberg, MD   1 year ago Ptosis of right eyelid   [facility] Pickard, Priscille Heidelberg, MD   1 year ago Pure hypercholesterolemia   [facility] Pickard, Priscille Heidelberg, MD      Future Appointments            In 2 months Pickard, Priscille Heidelberg, MD [facility], [facility]

## 2022-04-28 ENCOUNTER — Other Ambulatory Visit: Payer: Self-pay

## 2022-04-28 NOTE — Telephone Encounter (Signed)
Pharmacy faxed a refill request for hydrochlorothiazide (HYDRODIURIL) 25 MG tablet [[id number]]    Order Details Dose: 25 mg Route: Oral Frequency: Daily  Dispense Quantity: 90 tablet Refills: 3        Sig: Take 1 tablet (25 mg total) by mouth daily.       Start Date: 04/01/22 End Date: --  Written Date: 04/01/22 Expiration Date: 04/01/23

## 2022-04-30 NOTE — Telephone Encounter (Signed)
Requested medication (s) are due for refill today:no  Requested medication (s) are on the active medication list: yes  Last refill:  04/01/22  Future visit scheduled: yes  Notes to clinic:  Unable to refill per protocol, request is too soon. Last refill 04/01/22 for 90 and 3 refills.     Requested Prescriptions  Pending Prescriptions Disp Refills   hydrochlorothiazide (HYDRODIURIL) 25 MG tablet 90 tablet 3    Sig: Take 1 tablet (25 mg total) by mouth daily.     Cardiovascular: Diuretics - Thiazide Failed - 04/28/2022  1:48 PM      Failed - Cr in normal range and within 180 days    Creat  Date Value Ref Range Status  01/13/2022 1.02 (H) 0.60 - 1.00 mg/dL Final         Passed - K in normal range and within 180 days    Potassium  Date Value Ref Range Status  01/13/2022 4.1 3.5 - 5.3 mmol/L Final         Passed - Na in normal range and within 180 days    Sodium  Date Value Ref Range Status  01/13/2022 139 135 - 146 mmol/L Final         Passed - Last BP in normal range    BP Readings from Last 1 Encounters:  01/13/22 130/82         Passed - Valid encounter within last 6 months    Recent Outpatient Visits           3 months ago Benign essential HTN   [facility] Donita Brooks, MD   7 months ago Acute deep vein thrombosis (DVT) of popliteal vein of left lower extremity (HCC)   [facility] Valentino Nose, NP   9 months ago Pure hypercholesterolemia   [facility] Pickard, Priscille Heidelberg, MD   1 year ago Ptosis of right eyelid   [facility] Pickard, Priscille Heidelberg, MD   1 year ago Pure hypercholesterolemia   [facility] Pickard, Priscille Heidelberg, MD       Future Appointments             In 2 months Pickard, Priscille Heidelberg, MD [facility], [facility]

## 2022-05-01 ENCOUNTER — Telehealth: Payer: Self-pay | Admitting: Orthopedic Surgery

## 2022-05-01 DIAGNOSIS — G8929 Other chronic pain: Secondary | ICD-10-CM

## 2022-05-01 DIAGNOSIS — M7121 Synovial cyst of popliteal space [Baker], right knee: Secondary | ICD-10-CM

## 2022-05-01 NOTE — Telephone Encounter (Signed)
Patient called to inquire about having MRI on her right knee; aware of appointment scheduled for follow up visit with Dr Dallas Schimke on 05/16/22. Patient states her other provider ordered her a CT scan, and said he was to communicate with Dr Dallas Schimke about it; she is following up as she states she has not heard anything.

## 2022-05-02 NOTE — Telephone Encounter (Signed)
Called pt and MRI order placed.

## 2022-05-15 ENCOUNTER — Ambulatory Visit (HOSPITAL_COMMUNITY)
Admission: RE | Admit: 2022-05-15 | Discharge: 2022-05-15 | Disposition: A | Discharge status: Home / Self Care | Payer: Medicare Other | Source: Ambulatory Visit | Attending: Orthopedic Surgery | Admitting: Orthopedic Surgery

## 2022-05-15 DIAGNOSIS — M7121 Synovial cyst of popliteal space [Baker], right knee: Secondary | ICD-10-CM | POA: Insufficient documentation

## 2022-05-15 DIAGNOSIS — M25561 Pain in right knee: Secondary | ICD-10-CM | POA: Insufficient documentation

## 2022-05-15 DIAGNOSIS — G8929 Other chronic pain: Secondary | ICD-10-CM | POA: Insufficient documentation

## 2022-05-15 MED ORDER — HYDROCHLOROTHIAZIDE 25 MG PO TABS: 25.0000 mg | ORAL_TABLET | Freq: Every day | ORAL | 3 refills | Status: DC

## 2022-05-16 ENCOUNTER — Ambulatory Visit (INDEPENDENT_AMBULATORY_CARE_PROVIDER_SITE_OTHER): Payer: Medicare Other | Admitting: Orthopedic Surgery

## 2022-05-16 ENCOUNTER — Encounter: Payer: Self-pay | Admitting: Orthopedic Surgery

## 2022-05-16 VITALS — BP 170/89 | HR 62 | Ht 66.0 in | Wt 178.0 lb

## 2022-05-16 DIAGNOSIS — M25561 Pain in right knee: Secondary | ICD-10-CM

## 2022-05-16 DIAGNOSIS — G8929 Other chronic pain: Secondary | ICD-10-CM

## 2022-05-16 MED ORDER — METHYLPREDNISOLONE ACETATE 40 MG/ML IJ SUSP
40.0000 mg | Freq: Once | INTRAMUSCULAR | Status: AC
  Administered 2022-05-16: 40 mg via INTRA_ARTICULAR

## 2022-05-16 NOTE — Progress Notes (Signed)
Return patient Visit  Assessment: Wendy Newman is a 81 y.o. female with the following: Acute right knee pain  Plan: Wendy Newman continues to have right knee pain.  Ultrasound evaluation was negative for a Baker's cyst.  She has since obtained an MRI.  Results of the MRI were discussed with the patient today.  No surgical intervention is warranted.  Unclear etiology for her pain.  We discussed physical therapy, but she states she is unable to secure transportation.  Provided her with home exercises to complete.  We also completed another injection in clinic today.  Follow-up as needed.  Procedure note injection Right knee joint   Verbal consent was obtained to inject the right knee joint  Timeout was completed to confirm the site of injection.  The skin was prepped with alcohol and ethyl chloride was sprayed at the injection site.  A 21-gauge needle was used to inject 40 mg of Depo-Medrol and 1% lidocaine (3 cc) into the right knee using an anterolateral approach.  There were no complications. A sterile bandage was applied.    Follow-up: No follow-ups on file.  Subjective:  Chief Complaint  Patient presents with   Knee Pain    Right, MRI 05/15/22    History of Present Illness: Wendy Newman is a 81 y.o. female who returns to clinic for repeat evaluation of her right knee.  When I last saw her in clinic, I was concerned for a Baker's cyst.  Her knee was evaluated under ultrasound, but this was negative for a cyst.  She continues to have pain throughout the right knee.  As result, we obtained an MRI.  She is here to discuss the results today.  Prior injection provided about 1 month of symptomatic relief.  She has not done formal physical therapy.  She has done some home exercises, but states she lost the paperwork with the exercises.   Review of Systems: No fevers or chills.   No numbness or tingling No chest pain No shortness of breath No bowel or bladder dysfunction No GI  distress No headaches Objective: BP (!) 170/89   Pulse 62   Ht 5\' 6"  (1.676 m)   Wt 178 lb (80.7 kg)   BMI 28.73 kg/m   Physical Exam:  General: Alert and oriented. and No acute distress. Gait: Right sided antalgic gait.  Right knee without swelling.  She has a mild swelling in the posterior knee, which is tender to palpation.  Mild tenderness to palpation on the medial joint line.  Negative Lachman.  No increased laxity varus or valgus stress.  IMAGING: I personally reviewed images previously obtained from the ED  Right knee MRI.    Impression:  1. 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 2. Mild tricompartmental osteoarthritis, greatest in the lateral compartment. 3. Small joint effusion.   New Medications:  Meds ordered this encounter  Medications   methylPREDNISolone acetate (DEPO-MEDROL) injection 40 mg      , MD  05/16/2022 10:09 PM

## 2022-05-16 NOTE — Patient Instructions (Signed)
Can also look up PT exercises on the internet.  You Tube can have some good knee rehab exercises  Instructions Following Joint Injections  In clinic today, you received an injection in one of your joints (sometimes more than one).  Occasionally, you can have some pain at the injection site, this is normal.  You can place ice at the injection site, or take over-the-counter medications such as Tylenol (acetaminophen) or Advil (ibuprofen).  Please follow all directions listed on the bottle.  If your joint (knee or shoulder) becomes swollen, red or very painful, please contact the clinic for additional assistance.   Two medications were injected, including lidocaine and a steroid (often referred to as cortisone).  Lidocaine is effective almost immediately but wears off quickly.  However, the steroid can take a few days to improve your symptoms.  In some cases, it can make your pain worse for a couple of days.  Do not be concerned if this happens as it is common.  You can apply ice or take some over-the-counter medications as needed.     Knee Exercises  Ask your health care provider which exercises are safe for you. Do exercises exactly as told by your health care provider and adjust them as directed. It is normal to feel mild stretching, pulling, tightness, or discomfort as you do these exercises. Stop right away if you feel sudden pain or your pain gets worse. Do not begin these exercises until told by your health care provider.  Stretching and range-of-motion exercises These exercises warm up your muscles and joints and improve the movement and flexibility of your knee. These exercises also help to relieve pain and swelling.  Knee extension, prone Lie on your abdomen (prone position) on a bed. Place your left / right knee just beyond the edge of the surface so your knee is not on the bed. You can put a towel under your left / right thigh just above your kneecap for comfort. Relax your leg muscles  and allow gravity to straighten your knee (extension). You should feel a stretch behind your left / right knee. Hold this position for 10 seconds. Scoot up so your knee is supported between repetitions. Repeat 10 times. Complete this exercise 3-4 times per week.     Knee flexion, active Lie on your back with both legs straight. If this causes back discomfort, bend your left / right knee so your foot is flat on the floor. Slowly slide your left / right heel back toward your buttocks. Stop when you feel a gentle stretch in the front of your knee or thigh (flexion). Hold this position for 10 seconds. Slowly slide your left / right heel back to the starting position. Repeat 10 times. Complete this exercise 3-4 times per week.      Quadriceps stretch, prone Lie on your abdomen on a firm surface, such as a bed or padded floor. Bend your left / right knee and hold your ankle. If you cannot reach your ankle or pant leg, loop a belt around your foot and grab the belt instead. Gently pull your heel toward your buttocks. Your knee should not slide out to the side. You should feel a stretch in the front of your thigh and knee (quadriceps). Hold this position for 10 seconds. Repeat 10 times. Complete this exercise 3-4 times per week.      Hamstring, supine Lie on your back (supine position). Loop a belt or towel over the ball of your left /  right foot. The ball of your foot is on the walking surface, right under your toes. Straighten your left / right knee and slowly pull on the belt to raise your leg until you feel a gentle stretch behind your knee (hamstring). Do not let your knee bend while you do this. Keep your other leg flat on the floor. Hold this position for 10 seconds. Repeat 10 times. Complete this exercise 3-4 times per week.   Strengthening exercises These exercises build strength and endurance in your knee. Endurance is the ability to use your muscles for a long time, even after they  get tired.  Quadriceps, isometric This exercise stretches the muscles in front of your thigh (quadriceps) without moving your knee joint (isometric). Lie on your back with your left / right leg extended and your other knee bent. Put a rolled towel or small pillow under your knee if told by your health care provider. Slowly tense the muscles in the front of your left / right thigh. You should see your kneecap slide up toward your hip or see increased dimpling just above the knee. This motion will push the back of the knee toward the floor. For 10 seconds, hold the muscle as tight as you can without increasing your pain. Relax the muscles slowly and completely. Repeat 10 times. Complete this exercise 3-4 times per week. .     Straight leg raises This exercise stretches the muscles in front of your thigh (quadriceps) and the muscles that move your hips (hip flexors). Lie on your back with your left / right leg extended and your other knee bent. Tense the muscles in the front of your left / right thigh. You should see your kneecap slide up or see increased dimpling just above the knee. Your thigh may even shake a bit. Keep these muscles tight as you raise your leg 4-6 inches (10-15 cm) off the floor. Do not let your knee bend. Hold this position for 10 seconds. Keep these muscles tense as you lower your leg. Relax your muscles slowly and completely after each repetition. Repeat 10 times. Complete this exercise 3-4 times per week.  Hamstring, isometric Lie on your back on a firm surface. Bend your left / right knee about 30 degrees. Dig your left / right heel into the surface as if you are trying to pull it toward your buttocks. Tighten the muscles in the back of your thighs (hamstring) to "dig" as hard as you can without increasing any pain. Hold this position for 10 seconds. Release the tension gradually and allow your muscles to relax completely for __________ seconds after each  repetition. Repeat 10 times. Complete this exercise 3-4 times per week.  Hamstring curls If told by your health care provider, do this exercise while wearing ankle weights. Begin with 5 lb weights. Then increase the weight by 1 lb (0.5 kg) increments. You can also use an exercise band Lie on your abdomen with your legs straight. Bend your left / right knee as far as you can without feeling pain. Keep your hips flat against the floor. Hold this position for 10 seconds. Slowly lower your leg to the starting position. Repeat 10 times. Complete this exercise 3-4 times per week.      Squats This exercise strengthens the muscles in front of your thigh and knee (quadriceps). Stand in front of a table, with your feet and knees pointing straight ahead. You may rest your hands on the table for balance but not  for support. Slowly bend your knees and lower your hips like you are going to sit in a chair. Keep your weight over your heels, not over your toes. Keep your lower legs upright so they are parallel with the table legs. Do not let your hips go lower than your knees. Do not bend lower than told by your health care provider. If your knee pain increases, do not bend as low. Hold the squat position for 10 seconds. Slowly push with your legs to return to standing. Do not use your hands to pull yourself to standing. Repeat 10 times. Complete this exercise 3-4 times per week .     Wall slides This exercise strengthens the muscles in front of your thigh and knee (quadriceps). Lean your back against a smooth wall or door, and walk your feet out 18-24 inches (46-61 cm) from it. Place your feet hip-width apart. Slowly slide down the wall or door until your knees bend 90 degrees. Keep your knees over your heels, not over your toes. Keep your knees in line with your hips. Hold this position for 10 seconds. Repeat 10 times. Complete this exercise 3-4 times per week.      Straight leg raises This  exercise strengthens the muscles that rotate the leg at the hip and move it away from your body (hip abductors). Lie on your side with your left / right leg in the top position. Lie so your head, shoulder, knee, and hip line up. You may bend your bottom knee to help you keep your balance. Roll your hips slightly forward so your hips are stacked directly over each other and your left / right knee is facing forward. Leading with your heel, lift your top leg 4-6 inches (10-15 cm). You should feel the muscles in your outer hip lifting. Do not let your foot drift forward. Do not let your knee roll toward the ceiling. Hold this position for 10 seconds. Slowly return your leg to the starting position. Let your muscles relax completely after each repetition. Repeat 10 times. Complete this exercise 3-4 times per week.      Straight leg raises This exercise stretches the muscles that move your hips away from the front of the pelvis (hip extensors). Lie on your abdomen on a firm surface. You can put a pillow under your hips if that is more comfortable. Tense the muscles in your buttocks and lift your left / right leg about 4-6 inches (10-15 cm). Keep your knee straight as you lift your leg. Hold this position for 10 seconds. Slowly lower your leg to the starting position. Let your leg relax completely after each repetition. Repeat 10 times. Complete this exercise 3-4 times per week.

## 2022-07-17 ENCOUNTER — Ambulatory Visit: Payer: Self-pay | Admitting: Family Medicine

## 2022-07-24 ENCOUNTER — Encounter: Payer: Self-pay | Admitting: Family Medicine

## 2022-07-24 ENCOUNTER — Ambulatory Visit (INDEPENDENT_AMBULATORY_CARE_PROVIDER_SITE_OTHER): Payer: Medicare Other | Admitting: Family Medicine

## 2022-07-24 VITALS — BP 126/72 | HR 60 | Temp 97.1°F | Ht 66.0 in | Wt 179.4 lb

## 2022-07-24 DIAGNOSIS — Z23 Encounter for immunization: Secondary | ICD-10-CM

## 2022-07-24 DIAGNOSIS — M810 Age-related osteoporosis without current pathological fracture: Secondary | ICD-10-CM

## 2022-07-24 DIAGNOSIS — E7800 Pure hypercholesterolemia, unspecified: Secondary | ICD-10-CM

## 2022-07-24 DIAGNOSIS — I10 Essential (primary) hypertension: Secondary | ICD-10-CM

## 2022-07-24 MED ORDER — MELOXICAM 7.5 MG PO TABS: 7.5000 mg | ORAL_TABLET | Freq: Every day | ORAL | 5 refills | Status: DC

## 2022-07-24 NOTE — Progress Notes (Signed)
Subjective:    Patient ID: Wendy Newman, female    DOB: 1940-11-19, 81 y.o.   MRN: [id number]  HPI Patient is a very sweet 81 year old Caucasian female who is here today for checkup.  In December of last year she was diagnosed with a DVT.  She has completed 6 months of anticoagulation.  Since I last saw the patient, she has been battling pain in her right knee.  She has seen orthopedics who performed an MRI which showed mild tricompartmental arthritis and degeneration and fraying of her meniscus.  She has received cortisone injections that have helped periodically however she continues to have daily pain.  She is taking Tylenol but she is not taking any NSAID.  She has mild chronic kidney disease with a GFR above 50.  However I believe the benefit to an NSAID as far as her quality of life may outweigh the risk.  Her blood pressure is excellent.  She denies any chest pain shortness of breath or dyspnea on exertion.  She is due for fasting lab work as well as a flu shot  Past Medical History:  Diagnosis Date   Anxiety    Cataract    Edema of both legs    Essential hypertension    Osteoporosis    Vitamin D deficiency    Past Surgical History:  Procedure Laterality Date   ABDOMINAL HYSTERECTOMY     Complete   CESAREAN SECTION     X 3   COLONOSCOPY  10/10/2008   QDI:[id number]   COLONOSCOPY WITH PROPOFOL N/A 12/21/2014   Dr. Rourk:noncompliant left colon and redundant right colon, rectal and colonic mucosa appeared normal.    Current Outpatient Medications on File Prior to Visit  Medication Sig Dispense Refill   alendronate (FOSAMAX) 70 MG tablet Take 1 tablet (70 mg total) by mouth every 7 (seven) days. Take with a full glass of water on an empty stomach. 12 tablet 2   atorvastatin (LIPITOR) 20 MG tablet Take 1 tablet (20 mg total) by mouth daily. 90 tablet 2   cholecalciferol (VITAMIN D) 1000 UNITS tablet Take 1,000 Units by mouth daily.      citalopram (CELEXA) 20 MG tablet Take 1  tablet (20 mg total) by mouth daily. 90 tablet 0   fluticasone (FLONASE) 50 MCG/ACT nasal spray Place 2 sprays into both nostrils daily. 48 g 2   hydrochlorothiazide (HYDRODIURIL) 25 MG tablet Take 1 tablet (25 mg total) by mouth daily. 90 tablet 3   HYDROcodone-acetaminophen (NORCO/VICODIN) 5-325 MG tablet Take 1 tablet by mouth every 6 (six) hours as needed. 12 tablet 0   levocetirizine (XYZAL) 5 MG tablet TAKE 1 TABLET BY MOUTH EVERY DAY IN THE EVENING 90 tablet 2   rivaroxaban (XARELTO) 20 MG TABS tablet Take 1 tablet (20 mg total) by mouth daily. 90 tablet 0   SHINGRIX injection      No current facility-administered medications on file prior to visit.   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  Tobacco Use   Smoking status: Never   Smokeless tobacco: Never  Vaping Use   Vaping Use: Never used  Substance and Sexual Activity   Alcohol use: No    Alcohol/week: 0.0  standard drinks of alcohol   Drug use: No   Sexual activity: Yes    Birth control/protection: Surgical  Other Topics Concern   Not on file  Social History Narrative   Not on file   Social Determinants of Health   Financial Resource Strain: Not on file  Food Insecurity: Not on file  Transportation Needs: Not on file  Physical Activity: Not on file  Stress: Not on file  Social Connections: Not on file  Intimate Partner Violence: Not on file      Review of Systems  All other systems reviewed and are negative.      Objective:   Physical Exam Vitals reviewed.  Constitutional:      General: She is not in acute distress.    Appearance: Normal appearance. She is normal weight. She is not ill-appearing, toxic-appearing or diaphoretic.  HENT:     Head: Normocephalic and  atraumatic.     Right Ear: Tympanic membrane, ear canal and external ear normal. There is no impacted cerumen.     Left Ear: Tympanic membrane, ear canal and external ear normal. There is no impacted cerumen.     Nose: No congestion or rhinorrhea.     Mouth/Throat:     Mouth: Mucous membranes are moist.     Pharynx: Oropharynx is clear. No oropharyngeal exudate or posterior oropharyngeal erythema.  Eyes:     General:        Left eye: No discharge.     Extraocular Movements: Extraocular movements intact.     Conjunctiva/sclera: Conjunctivae normal.     Pupils: Pupils are equal, round, and reactive to light.  Neck:     Vascular: No carotid bruit.  Cardiovascular:     Rate and Rhythm: Normal rate and regular rhythm.     Pulses: Normal pulses.     Heart sounds: Normal heart sounds. No murmur heard.    No friction rub. No gallop.  Pulmonary:     Effort: Pulmonary effort is normal. No respiratory distress.     Breath sounds: Normal breath sounds. No stridor. No wheezing, rhonchi or rales.  Chest:     Chest wall: No tenderness.  Abdominal:     General: Abdomen is flat. Bowel sounds are normal. There is no distension.     Palpations: Abdomen is soft.     Tenderness: There is no abdominal tenderness. There is no guarding or rebound.  Musculoskeletal:        General: Tenderness and deformity present. No swelling.     Cervical back: Normal range of motion and neck supple. No rigidity or tenderness.     Right lower leg: No edema.     Left lower leg: No edema.  Lymphadenopathy:     Cervical: No cervical adenopathy.  Skin:    Coloration: Skin is not jaundiced.     Findings: No bruising, erythema or rash.  Neurological:     General: No focal deficit present.     Mental Status: She is alert and oriented to person, place, and time. Mental status is at baseline.     Cranial Nerves: No cranial nerve deficit.     Sensory: No sensory deficit.     Motor: No weakness.     Coordination:  Coordination normal.     Gait: Gait normal.     Deep Tendon Reflexes: Reflexes normal.  Psychiatric:        Mood and Affect: Mood normal.        Behavior: Behavior normal.  Thought Content: Thought content normal.        Judgment: Judgment normal.           Assessment & Plan:  Benign essential HTN - Plan: CBC with Differential/Platelet, Lipid panel, COMPLETE METABOLIC PANEL WITH GFR  Pure hypercholesterolemia - Plan: CBC with Differential/Platelet, Lipid panel, COMPLETE METABOLIC PANEL WITH GFR  Osteoporosis, unspecified osteoporosis type, unspecified pathological fracture presence Patient is no longer on anticoagulation but she is suffering from significant knee pain due to degenerative arthritis and degeneration of the meniscus.  I will start her on meloxicam 7.5 mg daily.  Monitor renal function.  She can also supplement with Tylenol.  We could also perform cortisone injections up to every 3 months if necessary.  Check a fasting lipid panel.  Goal LDL cholesterol is less than 100.  Her blood pressure today is excellent.  She did receive a flu shot.

## 2022-07-24 NOTE — Addendum Note (Signed)
Addended by: Randal Buba K on: 07/24/2022 12:31 PM   Modules accepted: Orders

## 2022-07-25 LAB — COMPLETE METABOLIC PANEL WITH GFR
AG Ratio: 1.4 (calc) (ref 1.0–2.5)
ALT: 11 U/L (ref 6–29)
AST: 14 U/L (ref 10–35)
Albumin: 4 g/dL (ref 3.6–5.1)
Alkaline phosphatase (APISO): 61 U/L (ref 37–153)
BUN: 18 mg/dL (ref 7–25)
CO2: 24 mmol/L (ref 20–32)
Calcium: 8.8 mg/dL (ref 8.6–10.4)
Chloride: 111 mmol/L — ABNORMAL HIGH (ref 98–110)
Creat: 0.82 mg/dL (ref 0.60–1.00)
Globulin: 2.9 g/dL (calc) (ref 1.9–3.7)
Glucose, Bld: 91 mg/dL (ref 65–99)
Potassium: 4.4 mmol/L (ref 3.5–5.3)
Sodium: 143 mmol/L (ref 135–146)
Total Bilirubin: 0.5 mg/dL (ref 0.2–1.2)
Total Protein: 6.9 g/dL (ref 6.1–8.1)
eGFR: 73 mL/min/{1.73_m2} (ref >=60)

## 2022-07-25 LAB — CBC WITH DIFFERENTIAL/PLATELET
Absolute Monocytes: 592 cells/uL (ref 200–950)
Basophils Absolute: 20 cells/uL (ref 0–200)
Basophils Relative: 0.3 %
Eosinophils Absolute: 68 cells/uL (ref 15–500)
Eosinophils Relative: 1 %
HCT: 37.2 % (ref 35.0–45.0)
Hemoglobin: 12.3 g/dL (ref 11.7–15.5)
Lymphs Abs: 2190 cells/uL (ref 850–3900)
MCH: 30.3 pg (ref 27.0–33.0)
MCHC: 33.1 g/dL (ref 32.0–36.0)
MCV: 91.6 fL (ref 80.0–100.0)
MPV: 11.5 fL (ref 7.5–12.5)
Monocytes Relative: 8.7 %
Neutro Abs: 3930 cells/uL (ref 1500–7800)
Neutrophils Relative %: 57.8 %
Platelets: 215 10*3/uL (ref 140–400)
RBC: 4.06 10*6/uL (ref 3.80–5.10)
RDW: 12.7 % (ref 11.0–15.0)
Total Lymphocyte: 32.2 %
WBC: 6.8 10*3/uL (ref 3.8–10.8)

## 2022-07-25 LAB — LIPID PANEL
Cholesterol: 155 mg/dL (ref <200)
HDL: 42 mg/dL — ABNORMAL LOW (ref >=50)
LDL Cholesterol (Calc): 94 mg/dL (calc)
Non-HDL Cholesterol (Calc): 113 mg/dL (calc) (ref <130)
Total CHOL/HDL Ratio: 3.7 (calc) (ref <5.0)
Triglycerides: 98 mg/dL (ref <150)

## 2022-07-26 ENCOUNTER — Other Ambulatory Visit: Payer: Self-pay | Admitting: Family Medicine

## 2022-07-26 DIAGNOSIS — E7800 Pure hypercholesterolemia, unspecified: Secondary | ICD-10-CM

## 2022-07-28 ENCOUNTER — Telehealth: Payer: Self-pay

## 2022-07-28 NOTE — Telephone Encounter (Signed)
Pt called in wanting to discuss recent lab results. Please advise.  Cb#: [phone]

## 2022-09-15 ENCOUNTER — Other Ambulatory Visit: Payer: Self-pay | Admitting: Family Medicine

## 2022-09-16 ENCOUNTER — Ambulatory Visit (INDEPENDENT_AMBULATORY_CARE_PROVIDER_SITE_OTHER): Payer: Medicare Other | Admitting: Family Medicine

## 2022-09-16 ENCOUNTER — Encounter: Payer: Self-pay | Admitting: Family Medicine

## 2022-09-16 ENCOUNTER — Ambulatory Visit (HOSPITAL_COMMUNITY)
Admission: RE | Admit: 2022-09-16 | Discharge: 2022-09-16 | Disposition: A | Discharge status: Home / Self Care | Payer: Medicare Other | Source: Ambulatory Visit | Attending: Family Medicine | Admitting: Family Medicine

## 2022-09-16 VITALS — BP 120/60 | HR 52 | Temp 97.8°F | Ht 66.0 in | Wt 180.0 lb

## 2022-09-16 DIAGNOSIS — M25561 Pain in right knee: Secondary | ICD-10-CM | POA: Diagnosis not present

## 2022-09-16 NOTE — Telephone Encounter (Signed)
Requested medication (s) are due for refill today: routing for review  Requested medication (s) are on the active medication list: yes  Last refill:  02/28/22  Future visit scheduled: yes  Notes to clinic:  Unable to refill per protocol due to failed labs, no updated results.      Requested Prescriptions  Pending Prescriptions Disp Refills   alendronate (FOSAMAX) 70 MG tablet [Pharmacy Med Name: Alendronate Sodium 70 MG Oral Tablet] 12 tablet 3    Sig: TAKE 1 TABLET BY MOUTH EVERY 7  DAYS. TAKE WITH A FULL GLASS OF  WATER ON AN EMPTY STOMACH     Endocrinology:  Bisphosphonates Failed - 09/15/2022 10:28 PM      Failed - Vitamin D in normal range and within 360 days    Vit D, 25-Hydroxy  Date Value Ref Range Status  06/25/2015 42 30 - 100 ng/mL Final    Comment:    Vitamin D Status           25-OH Vitamin D        Deficiency                <20 ng/mL        Insufficiency         20 - 29 ng/mL        Optimal             > or = 30 ng/mL   For 25-OH Vitamin D testing on patients on D2-supplementation and patients for whom quantitation of D2 and D3 fractions is required, the [facility] 25-OH VIT D, (D2,D3), LC/MS/MS is recommended: order code [id number] (patients > 2 yrs).   Footnotes:  (1) ** Please note change in unit of measure and reference range(s). **            Failed - Mg Level in normal range and within 360 days    No results found for: "MG"       Failed - Phosphate in normal range and within 360 days    No results found for: "PHOS"       Passed - Ca in normal range and within 360 days    Calcium  Date Value Ref Range Status  07/24/2022 8.8 8.6 - 10.4 mg/dL Final         Passed - Cr in normal range and within 360 days    Creat  Date Value Ref Range Status  07/24/2022 0.82 0.60 - 1.00 mg/dL Final         Passed - eGFR is 30 or above and within 360 days    GFR, Est African American  Date Value Ref Range Status  12/14/2020 68 > OR = 60 mL/min/1.45m Final    GFR, Est Non African American  Date Value Ref Range Status  12/14/2020 59 (L) > OR = 60 mL/min/1.769mFinal   eGFR  Date Value Ref Range Status  07/24/2022 73 > OR = 60 mL/min/1.7368minal         Passed - Valid encounter within last 12 months    Recent Outpatient Visits           8 months ago Benign essential HTN   [facility] T, MD   11 months ago Acute deep vein thrombosis (DVT) of popliteal vein of left lower extremity ([place] [facility]   1 year ago Pure hypercholesterolemia   [facility] [facility]  T, MD   1 year ago Ptosis of right eyelid   [place] Pickard, Cammie Mcgee, MD   1 year ago Pure hypercholesterolemia   [place] Pickard, Cammie Mcgee, MD       Future Appointments             In 4 months Pickard, Cammie Mcgee, MD [place]            Passed - Bone Mineral Density or Dexa Scan completed in the last 2 years

## 2022-09-16 NOTE — Progress Notes (Signed)
Acute Office Visit  Subjective:     Patient ID: Wendy Newman, female    DOB: 1940/11/22, 81 y.o.   MRN: [id number]  Chief Complaint  Patient presents with   Follow-up    Larey Seat Sunday 11/12, still sore (hard to sit)    HPI Patient is in today for fall Sunday 11/12 and hurt her bottom then fell the following Saturday and injured her right knee. She lost her balance with her dog both times. Her bottom hurts to sit. Her right knee hurts when standing, it is painful to bear weight. She denies swelling and redness. Had taken Tylenol extra with some relief.   Review of Systems  Musculoskeletal:  Positive for falls (right knee) and joint pain.  Neurological:  Negative for dizziness, tingling, sensory change and weakness.  All other systems reviewed and are negative.    Past Medical History:  Diagnosis Date   Anxiety    Cataract    DVT (deep venous thrombosis) (HCC)    Edema of both legs    Essential hypertension    Osteoporosis    Vitamin D deficiency    Past Surgical History:  Procedure Laterality Date   ABDOMINAL HYSTERECTOMY     Complete   CESAREAN SECTION     X 3   COLONOSCOPY  10/10/2008   [id number]   COLONOSCOPY WITH PROPOFOL N/A 12/21/2014   Dr. Rourk:noncompliant left colon and redundant right colon, rectal and colonic mucosa appeared normal.    Current Outpatient Medications on File Prior to Visit  Medication Sig Dispense Refill   alendronate (FOSAMAX) 70 MG tablet Take 1 tablet (70 mg total) by mouth every 7 (seven) days. Take with a full glass of water on an empty stomach. 12 tablet 2   atorvastatin (LIPITOR) 20 MG tablet TAKE 1 TABLET BY MOUTH DAILY 100 tablet 2   cholecalciferol (VITAMIN D) 1000 UNITS tablet Take 1,000 Units by mouth daily.      citalopram (CELEXA) 20 MG tablet Take 1 tablet (20 mg total) by mouth daily. 90 tablet 0   fluticasone (FLONASE) 50 MCG/ACT nasal spray Place 2 sprays into both nostrils daily. 48 g 2   hydrochlorothiazide  (HYDRODIURIL) 25 MG tablet Take 1 tablet (25 mg total) by mouth daily. 90 tablet 3   levocetirizine (XYZAL) 5 MG tablet TAKE 1 TABLET BY MOUTH EVERY DAY IN THE EVENING 90 tablet 2   meloxicam (MOBIC) 7.5 MG tablet Take 1 tablet (7.5 mg total) by mouth daily. 30 tablet 5   SHINGRIX injection      HYDROcodone-acetaminophen (NORCO/VICODIN) 5-325 MG tablet Take 1 tablet by mouth every 6 (six) hours as needed. (Patient not taking: Reported on 09/16/2022) 12 tablet 0   XARELTO 20 MG TABS tablet TAKE 1 TABLET BY MOUTH EVERY DAY (Patient not taking: Reported on 09/16/2022) 100 tablet 2   No current facility-administered medications on file prior to visit.   No Known Allergies      Objective:    BP 120/60   Pulse (!) 52   Temp 97.8 F (36.6 C) (Oral)   Ht 5\' 6"  (1.676 m)   Wt 180 lb (81.6 kg)   SpO2 100%   BMI 29.05 kg/m    Physical Exam Vitals and nursing note reviewed.  Constitutional:      Appearance: Normal appearance. She is normal weight.  HENT:     Head: Normocephalic and atraumatic.  Musculoskeletal:     Right knee: Bony tenderness (patella) present. No swelling or  deformity. Decreased range of motion. Tenderness present over the patellar tendon.  Skin:    General: Skin is warm and dry.  Neurological:     General: No focal deficit present.     Mental Status: She is alert and oriented to person, place, and time. Mental status is at baseline.  Psychiatric:        Mood and Affect: Mood normal.        Behavior: Behavior normal.        Thought Content: Thought content normal.        Judgment: Judgment normal.     No results found for any visits on 09/16/22.      Assessment & Plan:   1. Acute pain of right knee Traumatic injury to right knee 3 days ago with tenderness to patella and inferior patella. Painful to bear weight and flex 90 degrees. Will obtain XRay to evaluate for fracture. Encouraged use of brace, rest, ice, and elevation. May continue Tylenol OTC PRN. -  DG KNEE 3 VIEW RIGHT; Future    No follow-ups on file.  Park Meo, FNP

## 2022-09-17 ENCOUNTER — Telehealth: Payer: Self-pay | Admitting: Family Medicine

## 2022-09-17 NOTE — Telephone Encounter (Signed)
Called to notify patient of negative knee x-ray. No answer, message left, continue rest, ice, elevation, OTC Tylenol.

## 2022-09-30 ENCOUNTER — Other Ambulatory Visit: Payer: Self-pay

## 2022-09-30 ENCOUNTER — Ambulatory Visit (INDEPENDENT_AMBULATORY_CARE_PROVIDER_SITE_OTHER): Payer: Medicare Other

## 2022-09-30 VITALS — Ht 66.0 in | Wt 178.0 lb

## 2022-09-30 DIAGNOSIS — Z0000 Encounter for general adult medical examination without abnormal findings: Secondary | ICD-10-CM | POA: Diagnosis not present

## 2022-09-30 NOTE — Telephone Encounter (Signed)
Refill requested during annual wellness visit to be sent to mail order pharmacy

## 2022-09-30 NOTE — Patient Instructions (Signed)
[name] , Thank you for taking time to come for your Medicare Wellness Visit. I appreciate your ongoing commitment to your health goals. Please review the following plan we discussed and let me know if I can assist you in the future.   These are the goals we discussed:  Goals       LIFESTYLE - DECREASE FALLS RISK (pt-stated)        This is a list of the screening recommended for you and due dates:  Health Maintenance  Topic Date Due   Hepatitis C Screening: USPSTF Recommendation to screen - Ages 23-79 yo.  Never done   DTaP/Tdap/Td vaccine (1 - Tdap) Never done   Zoster (Shingles) Vaccine (2 of 2) 03/10/2022   Flu Shot  05/27/2022   COVID-19 Vaccine (4 - 2023-24 season) 06/27/2022   Medicare Annual Wellness Visit  10/01/2023   Pneumonia Vaccine  Completed   DEXA scan (bone density measurement)  Completed   HPV Vaccine  Aged Out   Colon Cancer Screening  Discontinued    Advanced directives: Advance directive discussed with you today. I have provided a copy for you to complete at home and have notarized. Once this is complete please bring a copy in to our office so we can scan it into your chart.   Conditions/risks identified: Aim for 30 minutes of exercise or brisk walking, 6-8 glasses of water, and 5 servings of fruits and vegetables each day.   Next appointment: Follow up in one year for your annual wellness visit    Preventive Care 65 Years and Older, Female Preventive care refers to lifestyle choices and visits with your health care provider that can promote health and wellness. What does preventive care include? A yearly physical exam. This is also called an annual well check. Dental exams once or twice a year. Routine eye exams. Ask your health care provider how often you should have your eyes checked. Personal lifestyle choices, including: Daily care of your teeth and gums. Regular physical activity. Eating a healthy diet. Avoiding tobacco and drug use. Limiting  alcohol use. Practicing safe sex. Taking low-dose aspirin every day. Taking vitamin and mineral supplements as recommended by your health care provider. What happens during an annual well check? The services and screenings done by your health care provider during your annual well check will depend on your age, overall health, lifestyle risk factors, and family history of disease. Counseling  Your health care provider may ask you questions about your: Alcohol use. Tobacco use. Drug use. Emotional well-being. Home and relationship well-being. Sexual activity. Eating habits. History of falls. Memory and ability to understand (cognition). Work and work Astronomer. Reproductive health. Screening  You may have the following tests or measurements: Height, weight, and BMI. Blood pressure. Lipid and cholesterol levels. These may be checked every 5 years, or more frequently if you are over 33 years old. Skin check. Lung cancer screening. You may have this screening every year starting at age 79 if you have a 30-pack-year history of smoking and currently smoke or have quit within the past 15 years. Fecal occult blood test (FOBT) of the stool. You may have this test every year starting at age 79. Flexible sigmoidoscopy or colonoscopy. You may have a sigmoidoscopy every 5 years or a colonoscopy every 10 years starting at age 79. Hepatitis C blood test. Hepatitis B blood test. Sexually transmitted disease (STD) testing. Diabetes screening. This is done by checking your blood sugar (glucose) after you have not  eaten for a while (fasting). You may have this done every 1-3 years. Bone density scan. This is done to screen for osteoporosis. You may have this done starting at age 79. Mammogram. This may be done every 1-2 years. Talk to your health care provider about how often you should have regular mammograms. Talk with your health care provider about your test results, treatment options, and if  necessary, the need for more tests. Vaccines  Your health care provider may recommend certain vaccines, such as: Influenza vaccine. This is recommended every year. Tetanus, diphtheria, and acellular pertussis (Tdap, Td) vaccine. You may need a Td booster every 10 years. Zoster vaccine. You may need this after age 79. Pneumococcal 13-valent conjugate (PCV13) vaccine. One dose is recommended after age 79. Pneumococcal polysaccharide (PPSV23) vaccine. One dose is recommended after age 79. Talk to your health care provider about which screenings and vaccines you need and how often you need them. This information is not intended to replace advice given to you by your health care provider. Make sure you discuss any questions you have with your health care provider. Document Released: 11/09/2015 Document Revised: 07/02/2016 Document Reviewed: 08/14/2015 Elsevier Interactive Patient Education  2017 [place] Prevention in the Home Falls can cause injuries. They can happen to people of all ages. There are many things you can do to make your home safe and to help prevent falls. What can I do on the outside of my home? Regularly fix the edges of walkways and driveways and fix any cracks. Remove anything that might make you trip as you walk through a door, such as a raised step or threshold. Trim any bushes or trees on the path to your home. Use bright outdoor lighting. Clear any walking paths of anything that might make someone trip, such as rocks or tools. Regularly check to see if handrails are loose or broken. Make sure that both sides of any steps have handrails. Any raised decks and porches should have guardrails on the edges. Have any leaves, snow, or ice cleared regularly. Use sand or salt on walking paths during winter. Clean up any spills in your garage right away. This includes oil or grease spills. What can I do in the bathroom? Use night lights. Install grab bars by the toilet  and in the tub and shower. Do not use towel bars as grab bars. Use non-skid mats or decals in the tub or shower. If you need to sit down in the shower, use a plastic, non-slip stool. Keep the floor dry. Clean up any water that spills on the floor as soon as it happens. Remove soap buildup in the tub or shower regularly. Attach bath mats securely with double-sided non-slip rug tape. Do not have throw rugs and other things on the floor that can make you trip. What can I do in the bedroom? Use night lights. Make sure that you have a light by your bed that is easy to reach. Do not use any sheets or blankets that are too big for your bed. They should not hang down onto the floor. Have a firm chair that has side arms. You can use this for support while you get dressed. Do not have throw rugs and other things on the floor that can make you trip. What can I do in the kitchen? Clean up any spills right away. Avoid walking on wet floors. Keep items that you use a lot in easy-to-reach places. If you need to reach  something above you, use a strong step stool that has a grab bar. Keep electrical cords out of the way. Do not use floor polish or wax that makes floors slippery. If you must use wax, use non-skid floor wax. Do not have throw rugs and other things on the floor that can make you trip. What can I do with my stairs? Do not leave any items on the stairs. Make sure that there are handrails on both sides of the stairs and use them. Fix handrails that are broken or loose. Make sure that handrails are as long as the stairways. Check any carpeting to make sure that it is firmly attached to the stairs. Fix any carpet that is loose or worn. Avoid having throw rugs at the top or bottom of the stairs. If you do have throw rugs, attach them to the floor with carpet tape. Make sure that you have a light switch at the top of the stairs and the bottom of the stairs. If you do not have them, ask someone to add  them for you. What else can I do to help prevent falls? Wear shoes that: Do not have high heels. Have rubber bottoms. Are comfortable and fit you well. Are closed at the toe. Do not wear sandals. If you use a stepladder: Make sure that it is fully opened. Do not climb a closed stepladder. Make sure that both sides of the stepladder are locked into place. Ask someone to hold it for you, if possible. Clearly mark and make sure that you can see: Any grab bars or handrails. First and last steps. Where the edge of each step is. Use tools that help you move around (mobility aids) if they are needed. These include: Canes. Walkers. Scooters. Crutches. Turn on the lights when you go into a dark area. Replace any light bulbs as soon as they burn out. Set up your furniture so you have a clear path. Avoid moving your furniture around. If any of your floors are uneven, fix them. If there are any pets around you, be aware of where they are. Review your medicines with your doctor. Some medicines can make you feel dizzy. This can increase your chance of falling. Ask your doctor what other things that you can do to help prevent falls. This information is not intended to replace advice given to you by your health care provider. Make sure you discuss any questions you have with your health care provider. Document Released: 08/09/2009 Document Revised: 03/20/2016 Document Reviewed: 11/17/2014 Elsevier Interactive Patient Education  2017 [employer].

## 2022-09-30 NOTE — Progress Notes (Signed)
Subjective:   Wendy Newman is a 81 y.o. female who presents for Medicare Annual (Subsequent) preventive examination.  I connected with  [name] on 09/30/22 by a audio enabled telemedicine application and verified that I am speaking with the correct person using two identifiers.  Patient Location: Home  Provider Location: Office/Clinic  I discussed the limitations of evaluation and management by telemedicine. The patient expressed understanding and agreed to proceed.  Review of Systems     Cardiac Risk Factors include: advanced age (>50men, >57 women);dyslipidemia;hypertension;sedentary lifestyle     Objective:    Today's Vitals   09/30/22 0908  Weight: 178 lb (80.7 kg)  Height:  (1.676 m)  PainSc: 3    Body mass index is 28.73 kg/m.     09/30/2022    9:37 AM 01/04/2022    3:41 PM 09/26/2021    3:09 PM 06/14/2016   11:55 AM 06/09/2016    8:57 PM 03/13/2016    9:44 AM 12/21/2014   10:19 AM  Advanced Directives  Does Patient Have a Medical Advance Directive? No No No No No No No  Would patient like information on creating a medical advance directive? Yes (MAU/Ambulatory/Procedural Areas - Information given)  No - Patient declined No - patient declined information  No - patient declined information No - patient declined information    Current Medications (verified) Outpatient Encounter Medications as of 09/30/2022  Medication Sig   alendronate (FOSAMAX) 70 MG tablet Take 1 tablet (70 mg total) by mouth every 7 (seven) days. Take with a full glass of water on an empty stomach.   atorvastatin (LIPITOR) 20 MG tablet TAKE 1 TABLET BY MOUTH DAILY   cholecalciferol (VITAMIN D) 1000 UNITS tablet Take 1,000 Units by mouth daily.    citalopram (CELEXA) 20 MG tablet Take 1 tablet (20 mg total) by mouth daily.   fluticasone (FLONASE) 50 MCG/ACT nasal spray Place 2 sprays into both nostrils daily.   hydrochlorothiazide (HYDRODIURIL) 25 MG tablet Take 1 tablet (25 mg total)  by mouth daily.   levocetirizine (XYZAL) 5 MG tablet TAKE 1 TABLET BY MOUTH EVERY DAY IN THE EVENING   meloxicam (MOBIC) 7.5 MG tablet Take 1 tablet (7.5 mg total) by mouth daily.   SHINGRIX injection    XARELTO 20 MG TABS tablet TAKE 1 TABLET BY MOUTH EVERY DAY (Patient not taking: Reported on 09/30/2022)   [DISCONTINUED] HYDROcodone-acetaminophen (NORCO/VICODIN) 5-325 MG tablet Take 1 tablet by mouth every 6 (six) hours as needed. (Patient not taking: Reported on 09/16/2022)   No facility-administered encounter medications on file as of 09/30/2022.    Allergies (verified) Patient has no known allergies.   History: Past Medical History:  Diagnosis Date   Anxiety    Cataract    DVT (deep venous thrombosis) (HCC)    Edema of both legs    Essential hypertension    Osteoporosis    Vitamin D deficiency    Past Surgical History:  Procedure Laterality Date   ABDOMINAL HYSTERECTOMY     Complete   CESAREAN SECTION     X 3   COLONOSCOPY  10/10/2008   [id number]   COLONOSCOPY WITH PROPOFOL N/A 12/21/2014   Dr. Rourk:noncompliant left colon and redundant right colon, rectal and colonic mucosa appeared normal.    Family History  Problem Relation Age of Onset   Cancer Mother 16       Breast. age 22.    Colon polyps Mother    Heart disease Father  96       CABG   Cancer Sister 30       Lung   Cancer Brother 29       Started in the back   Colon cancer Other        Age 36s. 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  Tobacco Use   Smoking status: Never   Smokeless tobacco: Never  Vaping Use   Vaping Use: Never used  Substance and Sexual Activity   Alcohol use: No    Alcohol/week: 0.0 standard drinks of alcohol   Drug use: No   Sexual  activity: Yes    Birth control/protection: Surgical  Other Topics Concern   Not on file  Social History Narrative   Lives alone    Does not drive relies on grandson or neighbor for transportation    Social Determinants of Health   Financial Resource Strain: Low Risk  (09/30/2022)   Overall Financial Resource Strain (CARDIA)    Difficulty of Paying Living Expenses: Not hard at all  Food Insecurity: No Food Insecurity (09/30/2022)   Hunger Vital Sign    Worried About Running Out of Food in the Last Year: Never true    Ran Out of Food in the Last Year: Never true  Transportation Needs: No Transportation Needs (09/30/2022)   PRAPARE - Administrator, Civil Service (Medical): No    Lack of Transportation (Non-Medical): No  Physical Activity: Not on file  Stress: No Stress Concern Present (09/30/2022)   [employer] of Occupational Health - Occupational Stress Questionnaire    Feeling of Stress : Not at all  Social Connections: Socially Isolated (09/30/2022)   Social Connection and Isolation Panel [NHANES]    Frequency of Communication with Friends and Family: Twice a week    Frequency of Social Gatherings with Friends and Family: Once a week    Attends Religious Services: Never    Database administrator or Organizations: No    Attends Banker Meetings: Never    Marital Status: Widowed    Tobacco Counseling Counseling given: Not Answered   Clinical Intake:  Pre-visit preparation completed: Yes  Pain : 0-10 Pain Score: 3  Pain Type: Chronic pain Pain Location: Knee Pain Orientation: Right Pain Descriptors / Indicators: Aching Pain Onset: More than a month ago Pain Frequency: Intermittent     BMI - recorded: 28.73 Nutritional Status: BMI 25 -29 Overweight Diabetes: No  How often do you need to have someone help you when you read instructions, pamphlets, or other written materials from your doctor or pharmacy?: 1 - Never  Diabetic?No    Interpreter Needed?: No  Information entered by :: Kandis Fantasia LPN   Activities of Daily Living    09/30/2022    9:37 AM  In your present state of health, do you have any difficulty performing the following activities:  Hearing? 0  Vision? 0  Difficulty concentrating or making decisions? 0  Walking or climbing stairs? 1  Comment due to knee pain  Dressing or bathing? 0  Doing errands, shopping? 0  Preparing Food and eating ? N  Using the Toilet? N  In the past six months, have you  accidently leaked urine? N  Do you have problems with loss of bowel control? N  Managing your Medications? N  Managing your Finances? N  Housekeeping or managing your Housekeeping? N    Patient Care Team: Donita BrooksPickard, Warren T, MD as PCP - General (Family Medicine) [place] BaliFields, Sandi L, MD (Inactive) as Consulting Physician (Gastroenterology)  Indicate any recent Medical Services you may have received from other than [facility] providers in the past year (date may be approximate).     Assessment:   This is a routine wellness examination for CutlerBarbara.  Hearing/Vision screen Hearing Screening - Comments:: No concerns   Vision Screening - Comments:: Up to date with routine eye exams with [facility] Ace([place])   Dietary issues and exercise activities discussed: Current Exercise Habits: The patient does not participate in regular exercise at present   Goals Addressed               This Visit's Progress     LIFESTYLE - DECREASE FALLS RISK (pt-stated)         Depression Screen    09/30/2022    9:35 AM 07/24/2022   10:16 AM 07/24/2022   10:15 AM 07/15/2021   10:09 AM 03/26/2021    2:25 PM 12/14/2020    9:44 AM 07/10/2020   10:14 AM  PHQ 2/9 Scores  PHQ - 2 Score 0 0 0 0 0 0 0  PHQ- 9 Score     0 0     Fall Risk    09/30/2022    9:21 AM 09/16/2022    9:47 AM 09/16/2022    9:13 AM 07/15/2021   10:09 AM 03/26/2021    2:27 PM  Fall Risk   Falls in the past year? 1 1 1  0 0  Number falls in  past yr: 1 1 0 0 0  Injury with Fall? 1 1 1  0 0  Risk for fall due to : Impaired balance/gait;Impaired mobility;History of fall(s) Impaired balance/gait   No Fall Risks  Follow up Falls prevention discussed;Education provided;Falls evaluation completed   Falls evaluation completed Falls evaluation completed    FALL RISK PREVENTION PERTAINING TO THE HOME:  Any stairs in or around the home? No  If so, are there any without handrails? No  Home free of loose throw rugs in walkways, pet beds, electrical cords, etc? Yes  Adequate lighting in your home to reduce risk of falls? Yes   ASSISTIVE DEVICES UTILIZED TO PREVENT FALLS:  Life alert? No  Use of a cane, walker or w/c? No  Grab bars in the bathroom? Yes  Shower chair or bench in shower? Yes  Elevated toilet seat or a handicapped toilet? No   TIMED UP AND GO:  Was the test performed? No .  Length of time to ambulate 10 feet: telephonic visit   Cognitive Function:        09/30/2022    9:38 AM  6CIT Screen  What Year? 0 points  What month? 0 points  What time? 0 points  Count back from 20 0 points  Months in reverse 0 points  Repeat phrase 0 points  Total Score 0 points    Immunizations Immunization History  Administered Date(s) Administered   Fluad Quad(high Dose 65+) 07/15/2019, 07/10/2020   Influenza Split 09/15/2012   Influenza Whole 08/28/2008   Influenza, High Dose Seasonal PF 08/26/2018, 07/15/2021   Influenza,inj,Quad PF,6+ Mos 09/19/2013, 08/16/2014, 10/17/2015, 09/22/2016, 07/02/2017   Moderna Covid-19 Vaccine Bivalent Booster 5929yrs & up 01/13/2022  Moderna Sars-Covid-2 Vaccination 12/22/2019, 01/20/2020   Pneumococcal Conjugate-13 06/21/2014   Pneumococcal Polysaccharide-23 06/16/2012   Zoster Recombinat (Shingrix) 01/13/2022    TDAP status: Due, Education has been provided regarding the importance of this vaccine. Advised may receive this vaccine at local pharmacy or Health Dept. Aware to provide a copy  of the vaccination record if obtained from local pharmacy or Health Dept. Verbalized acceptance and understanding.  Flu Vaccine status: Up to date  Pneumococcal vaccine status: Up to date  Covid-19 vaccine status: Information provided on how to obtain vaccines.   Qualifies for Shingles Vaccine? Yes   Zostavax completed No   Shingrix Completed?: Yes  Screening Tests Health Maintenance  Topic Date Due   Hepatitis C Screening  Never done   DTaP/Tdap/Td (1 - Tdap) Never done   Zoster Vaccines- Shingrix (2 of 2) 03/10/2022   INFLUENZA VACCINE  05/27/2022   COVID-19 Vaccine (4 - 2023-24 season) 06/27/2022   Medicare Annual Wellness (AWV)  10/01/2023   Pneumonia Vaccine 81+ Years old  Completed   DEXA SCAN  Completed   HPV VACCINES  Aged Out   COLONOSCOPY (Pts 45-40yrs Insurance coverage will need to be confirmed)  Discontinued    Health Maintenance  Health Maintenance Due  Topic Date Due   Hepatitis C Screening  Never done   DTaP/Tdap/Td (1 - Tdap) Never done   Zoster Vaccines- Shingrix (2 of 2) 03/10/2022   INFLUENZA VACCINE  05/27/2022   COVID-19 Vaccine (4 - 2023-24 season) 06/27/2022    Colorectal cancer screening: No longer required.   Mammogram status: No longer required due to age/patient preference .  Bone Density status: Completed 11/15/20. Results reflect: Bone density results: OSTEOPOROSIS. Repeat every 2 years.  Lung Cancer Screening: (Low Dose CT Chest recommended if Age 25-80 years, 30 pack-year currently smoking OR have quit w/in 15years.) does not qualify.   Lung Cancer Screening Referral: n/a  Additional Screening:  Hepatitis C Screening: does qualify; Completed at next office visit   Vision Screening: Recommended annual ophthalmology exams for early detection of glaucoma and other disorders of the eye. Is the patient up to date with their annual eye exam?  Yes  Who is the provider or what is the name of the office in which the patient attends annual  eye exams? [facility] Ace)  If pt is not established with a provider, would they like to be referred to a provider to establish care? No .   Dental Screening: Recommended annual dental exams for proper oral hygiene  Community Resource Referral / Chronic Care Management: CRR required this visit?  No   CCM required this visit?  No      Plan:     I have personally reviewed and noted the following in the patient's chart:   Medical and social history Use of alcohol, tobacco or illicit drugs  Current medications and supplements including opioid prescriptions. Patient is not currently taking opioid prescriptions. Functional ability and status Nutritional status Physical activity Advanced directives List of other physicians Hospitalizations, surgeries, and ER visits in previous 12 months Vitals Screenings to include cognitive, depression, and falls Referrals and appointments  In addition, I have reviewed and discussed with patient certain preventive protocols, quality metrics, and best practice recommendations. A written personalized care plan for preventive services as well as general preventive health recommendations were provided to patient.     Durwin Nora, [place]   27/0/7867   Due to this being a virtual visit, the after visit  summary with patients personalized plan was offered to patient via mail or my-chart.  Per request, patient was mailed a copy of AVS   Nurse Notes: Patient would like advice on if a cane may be helpful in preventing additional falls due to knee instability, next appointment is 01/22/23.  Also asking that prescription for Meloxicam be sent as a 90 day supply to mail order pharmacy.

## 2022-10-01 ENCOUNTER — Other Ambulatory Visit: Payer: Self-pay

## 2022-10-07 ENCOUNTER — Other Ambulatory Visit: Payer: Self-pay | Admitting: Family Medicine

## 2022-10-07 NOTE — Telephone Encounter (Signed)
Requested Prescriptions  Pending Prescriptions Disp Refills   citalopram (CELEXA) 20 MG tablet [Pharmacy Med Name: Citalopram Hydrobromide 20 MG Oral Tablet] 90 tablet 0    Sig: TAKE 1 TABLET BY MOUTH DAILY     Psychiatry:  Antidepressants - SSRI Failed - 10/07/2022  8:49 AM      Failed - Valid encounter within last 6 months    Recent Outpatient Visits           8 months ago Benign essential HTN   [facility] Donita Brooks, MD   1 year ago Acute deep vein thrombosis (DVT) of popliteal vein of left lower extremity (HCC)   [facility] Valentino Nose, NP   1 year ago Pure hypercholesterolemia   [facility] Pickard, Priscille Heidelberg, MD   1 year ago Ptosis of right eyelid   [facility] Pickard, Priscille Heidelberg, MD   1 year ago Pure hypercholesterolemia   [facility] Pickard, Priscille Heidelberg, MD       Future Appointments             In 3 months Pickard, Priscille Heidelberg, MD [facility], [facility]

## 2022-10-09 ENCOUNTER — Other Ambulatory Visit: Payer: Self-pay | Admitting: Family Medicine

## 2022-10-09 NOTE — Telephone Encounter (Signed)
Requested medication (s) are due for refill today: yes  Requested medication (s) are on the active medication list: yes  Last refill:  07/24/22 #30 5 refills  Future visit scheduled: yes in 3 months  Notes to clinic:  manuel review required. Do you want to refill Rx?     Requested Prescriptions  Pending Prescriptions Disp Refills   meloxicam (MOBIC) 7.5 MG tablet 30 tablet 5    Sig: Take 1 tablet (7.5 mg total) by mouth daily.     Analgesics:  COX2 Inhibitors Failed - 10/09/2022  9:38 AM      Failed - Manual Review: Labs are only required if the patient has taken medication for more than 8 weeks.      Passed - HGB in normal range and within 360 days    Hemoglobin  Date Value Ref Range Status  07/24/2022 12.3 11.7 - 15.5 g/dL Final         Passed - Cr in normal range and within 360 days    Creat  Date Value Ref Range Status  07/24/2022 0.82 0.60 - 1.00 mg/dL Final         Passed - HCT in normal range and within 360 days    HCT  Date Value Ref Range Status  07/24/2022 37.2 35.0 - 45.0 % Final         Passed - AST in normal range and within 360 days    AST  Date Value Ref Range Status  07/24/2022 14 10 - 35 U/L Final         Passed - ALT in normal range and within 360 days    ALT  Date Value Ref Range Status  07/24/2022 11 6 - 29 U/L Final         Passed - eGFR is 30 or above and within 360 days    GFR, Est African American  Date Value Ref Range Status  12/14/2020 68 > OR = 60 mL/min/1.68m Final   GFR, Est Non African American  Date Value Ref Range Status  12/14/2020 59 (L) > OR = 60 mL/min/1.719mFinal   eGFR  Date Value Ref Range Status  07/24/2022 73 > OR = 60 mL/min/1.7363minal         Passed - Patient is not pregnant      Passed - Valid encounter within last 12 months    Recent Outpatient Visits           8 months ago Benign essential HTN   [place], War[place]ammie McgeeD   1 year ago Acute deep vein thrombosis (DVT) of  popliteal vein of left lower extremity (HCC[place] Bro[place]orney[place]illerEulogio BearP   1 year ago Pure hypercholesterolemia   [place], WarCammie McgeeD   1 year ago Ptosis of right eyelid   Bro[place]ard, WarCammie McgeeD   1 year ago Pure hypercholesterolemia   [place] McgeeD       Future Appointments             In 3 months Pickard, WarCammie McgeeD [place]

## 2022-10-09 NOTE — Telephone Encounter (Signed)
Called pt - LMOM refilled 07/24/2022 #30 5 rf - [facility] [place].

## 2022-10-09 NOTE — Telephone Encounter (Signed)
[facility]  Prescription Request  10/09/2022  Is this a "Controlled Substance" medicine? No  LOV: 07/24/2022  What is the name of the medication or equipment? Meloxicam tab  Have you contacted your pharmacy to request a refill? Yes   Which pharmacy would you like this sent to?  [facility] - [place], [place] - [address] [facility] to Registered [facility] [address] [place] [address] Phone: [phone] Fax: [phone]  [id number] [place], [place] - [address] AT [facility] [address] [place] [address] Phone: [phone] Fax: [phone]  [facility] - [place], [place] - [address] [place] [address] Phone: [phone] Fax: [phone]    Patient notified that their request is being sent to the clinical staff for review and that they should receive a response within 2 business days.   Please advise at [facility] [phone]

## 2022-10-10 ENCOUNTER — Other Ambulatory Visit: Payer: Self-pay | Admitting: *Deleted

## 2022-10-10 NOTE — Telephone Encounter (Signed)
Refilled 07/24/2022 330 5 rf. Requested Prescriptions  Pending Prescriptions Disp Refills   meloxicam (MOBIC) 7.5 MG tablet 30 tablet 5    Sig: Take 1 tablet (7.5 mg total) by mouth daily.     Analgesics:  COX2 Inhibitors Failed - 10/10/2022  3:09 PM      Failed - Manual Review: Labs are only required if the patient has taken medication for more than 8 weeks.      Passed - HGB in normal range and within 360 days    Hemoglobin  Date Value Ref Range Status  07/24/2022 12.3 11.7 - 15.5 g/dL Final         Passed - Cr in normal range and within 360 days    Creat  Date Value Ref Range Status  07/24/2022 0.82 0.60 - 1.00 mg/dL Final         Passed - HCT in normal range and within 360 days    HCT  Date Value Ref Range Status  07/24/2022 37.2 35.0 - 45.0 % Final         Passed - AST in normal range and within 360 days    AST  Date Value Ref Range Status  07/24/2022 14 10 - 35 U/L Final         Passed - ALT in normal range and within 360 days    ALT  Date Value Ref Range Status  07/24/2022 11 6 - 29 U/L Final         Passed - eGFR is 30 or above and within 360 days    GFR, Est African American  Date Value Ref Range Status  12/14/2020 68 > OR = 60 mL/min/1.23m Final   GFR, Est Non African American  Date Value Ref Range Status  12/14/2020 59 (L) > OR = 60 mL/min/1.762mFinal   eGFR  Date Value Ref Range Status  07/24/2022 73 > OR = 60 mL/min/1.7349minal         Passed - Patient is not pregnant      Passed - Valid encounter within last 12 months    Recent Outpatient Visits           9 months ago Benign essential HTN   [place], WarCammie McgeeD   1 year ago Acute deep vein thrombosis (DVT) of popliteal vein of left lower extremity ([place] BroBol[place]arrEulogio BearP   1 year ago Pure hypercholesterolemia   [place]ard, WarCammie McgeeD   1 year ago Ptosis of right eyelid   [place], WarCammie McgeeD   1 year ago Pure hypercholesterolemia   Bro[place]arCammie McgeeD       Future Appointments             In 3 months Pickard, WarCammie McgeeD [place]

## 2022-10-10 NOTE — Telephone Encounter (Signed)
  Media Information  Document Information  AMB Correspondence  REFILL  10/10/2022 08:48  Attached To:  Wendy Newman  Source Information  Default, Provider, MD

## 2022-11-11 ENCOUNTER — Other Ambulatory Visit (HOSPITAL_COMMUNITY): Payer: Self-pay | Admitting: Family Medicine

## 2022-11-11 DIAGNOSIS — Z1231 Encounter for screening mammogram for malignant neoplasm of breast: Secondary | ICD-10-CM

## 2022-11-20 ENCOUNTER — Other Ambulatory Visit: Payer: Self-pay | Admitting: Family Medicine

## 2022-11-26 ENCOUNTER — Other Ambulatory Visit: Payer: Self-pay

## 2022-11-26 DIAGNOSIS — I10 Essential (primary) hypertension: Secondary | ICD-10-CM

## 2022-11-26 DIAGNOSIS — E7800 Pure hypercholesterolemia, unspecified: Secondary | ICD-10-CM

## 2022-11-26 DIAGNOSIS — M810 Age-related osteoporosis without current pathological fracture: Secondary | ICD-10-CM

## 2022-11-26 DIAGNOSIS — E785 Hyperlipidemia, unspecified: Secondary | ICD-10-CM

## 2022-12-02 ENCOUNTER — Other Ambulatory Visit: Payer: Self-pay | Admitting: Family Medicine

## 2022-12-03 NOTE — Telephone Encounter (Signed)
Unable to refill per protocol, Rx request is too soon. Last refill 10/07/22 for 90 days. Next refill in March.  Requested Prescriptions  Pending Prescriptions Disp Refills   citalopram (CELEXA) 20 MG tablet [Pharmacy Med Name: Citalopram Hydrobromide 20 MG Oral Tablet] 90 tablet 3    Sig: TAKE 1 TABLET BY MOUTH DAILY     Psychiatry:  Antidepressants - SSRI Failed - 12/02/2022  5:57 AM      Failed - Valid encounter within last 6 months    Recent Outpatient Visits           10 months ago Benign essential HTN   [place] Susy Frizzle, MD   1 year ago Acute deep vein thrombosis (DVT) of popliteal vein of left lower extremity ([place])   [place] Eulogio Bear, NP   1 year ago Pure hypercholesterolemia   [place] Pickard, Cammie Mcgee, MD   1 year ago Ptosis of right eyelid   [place] Pickard, Cammie Mcgee, MD   1 year ago Pure hypercholesterolemia   [place] Pickard, Cammie Mcgee, MD       Future Appointments             In 1 month Pickard, Cammie Mcgee, MD [place], [facility]

## 2022-12-12 ENCOUNTER — Telehealth: Payer: Self-pay | Admitting: Pharmacist

## 2022-12-12 NOTE — Progress Notes (Unsigned)
Care Management & Coordination Services Pharmacy Team  Reason for Encounter: Appointment Reminder  Contacted patient to confirm {visittype:27222} appointment with ***, PharmD on *** at ***. {US [facility] Outreach:28874}  Do you have any problems getting your medications? {yes/no:20286} If yes what types of problems are you experiencing? {Problems:27223}  What is your top health concern you would like to discuss at your upcoming visit?   Have you seen any other providers since your last visit with PCP? {yes/no:20286}   Chart review:  Recent office visits:  09/16/2022 OV (Fam Med) Rubie Maid, FNP; no medication changes indicated.  07/24/2022 OV (PCP) Susy Frizzle, MD; I will start her on meloxicam 7.5 mg daily.   Recent consult visits:  None  Hospital visits:  None in previous 6 months   Star Rating Drugs:  Atorvastatin 20 mg last filled 07/31/2022 100 DS   Care Gaps: Annual wellness visit in last year? Yes  If Diabetic: Last eye exam / retinopathy screening: Last diabetic foot exam:   Future Appointments  Date Time Provider [place]  12/15/2022 10:30 AM AP-MM 1 AP-MM [place] H  12/16/2022  9:00 AM Edythe Clarity, [place] None  01/22/2023 10:15 AM Susy Frizzle, MD [facility]  10/06/2023  9:00 AM [facility]-NURSE HEALTH ADVISOR [facility]   April D Calhoun, [place] Pharmacist Assistant [phone]

## 2022-12-15 ENCOUNTER — Ambulatory Visit (HOSPITAL_COMMUNITY)
Admission: RE | Admit: 2022-12-15 | Discharge: 2022-12-15 | Disposition: A | Discharge status: Home / Self Care | Payer: 59 | Source: Ambulatory Visit | Attending: Family Medicine | Admitting: Family Medicine

## 2022-12-15 DIAGNOSIS — Z1231 Encounter for screening mammogram for malignant neoplasm of breast: Secondary | ICD-10-CM | POA: Insufficient documentation

## 2022-12-16 ENCOUNTER — Encounter: Payer: 59 | Admitting: Pharmacist

## 2022-12-16 NOTE — Progress Notes (Unsigned)
Care Management & Coordination Services Pharmacy Note  12/16/2022 Name:  Wendy Newman MRN:  [id number] DOB:  1941/05/28  Summary: ***  Recommendations/Changes made from today's visit: ***  Follow up plan: ***   Subjective: Wendy Newman is an 82 y.o. year old female who is a primary patient of Pickard, Cammie Mcgee, MD.  The care coordination team was consulted for assistance with disease management and care coordination needs.    Engaged with patient face to face for initial visit.  Recent office visits:  09/16/2022 OV (Fam Med) Mila Merry S, FNP; no medication changes indicated.   07/24/2022 OV (PCP) Susy Frizzle, MD; I will start her on meloxicam 7.5 mg daily.    Recent consult visits:  None   Hospital visits:  None in previous 6 months   Objective:  Lab Results  Component Value Date   CREATININE 0.82 07/24/2022   BUN 18 07/24/2022   EGFR 73 07/24/2022   GFRNONAA 59 (L) 12/14/2020   GFRAA 68 12/14/2020   NA 143 07/24/2022   K 4.4 07/24/2022   CALCIUM 8.8 07/24/2022   CO2 24 07/24/2022   GLUCOSE 91 07/24/2022    No results found for: "HGBA1C", "FRUCTOSAMINE", "GFR", "MICROALBUR"  Last diabetic Eye exam: No results found for: "HMDIABEYEEXA"  Last diabetic Foot exam: No results found for: "HMDIABFOOTEX"   Lab Results  Component Value Date   CHOL 155 07/24/2022   HDL 42 (L) 07/24/2022   LDLCALC 94 07/24/2022   TRIG 98 07/24/2022   CHOLHDL 3.7 07/24/2022       Latest Ref Rng & Units 07/24/2022   10:33 AM 01/13/2022   10:36 AM 07/15/2021   10:30 AM  Hepatic Function  Total Protein 6.1 - 8.1 g/dL 6.9  6.8  7.2   AST 10 - 35 U/L 14  15  14   $ ALT 6 - 29 U/L 11  11  8   $ Total Bilirubin 0.2 - 1.2 mg/dL 0.5  0.5  0.8     Lab Results  Component Value Date/Time   TSH 4.418 06/25/2015 09:37 AM       Latest Ref Rng & Units 07/24/2022   10:33 AM 01/13/2022   10:36 AM 07/15/2021   10:30 AM  CBC  WBC 3.8 - 10.8 Thousand/uL 6.8  6.5  7.3    Hemoglobin 11.7 - 15.5 g/dL 12.3  12.8  13.0   Hematocrit 35.0 - 45.0 % 37.2  39.4  40.4   Platelets 140 - 400 Thousand/uL 215  230  261     Lab Results  Component Value Date/Time   VD25OH 42 06/25/2015 09:37 AM   VD25OH 63 06/16/2013 10:28 AM    Clinical ASCVD: {YES/NO:21197} The ASCVD Risk score (Arnett DK, et al., 2019) failed to calculate for the following reasons:   The systolic blood pressure is missing    ***Other: (CHADS2VASc if Afib, MMRC or CAT for COPD, ACT, DEXA)     09/30/2022    9:35 AM 07/24/2022   10:16 AM 07/24/2022   10:15 AM  Depression screen PHQ 2/9  Decreased Interest 0 0 0  Down, Depressed, Hopeless 0 0 0  PHQ - 2 Score 0 0 0     Social History   Tobacco Use  Smoking Status Never  Smokeless Tobacco Never   BP Readings from Last 3 Encounters:  09/16/22 120/60  07/24/22 126/72  05/16/22 (!) 170/89   Pulse Readings from Last 3 Encounters:  09/16/22 (!) 52  07/24/22  60  05/16/22 62   Wt Readings from Last 3 Encounters:  09/30/22 178 lb (80.7 kg)  09/16/22 180 lb (81.6 kg)  07/24/22 179 lb 6.4 oz (81.4 kg)   BMI Readings from Last 3 Encounters:  09/30/22 28.73 kg/m  09/16/22 29.05 kg/m  07/24/22 28.96 kg/m    No Known Allergies  Medications Reviewed Today     Reviewed by Bernita Raisin, LPN (Licensed Practical Nurse) on 09/30/22 at [phone]  Med List Status: <None>   Medication Order Taking? Sig Documenting Provider Last Dose Status Informant  alendronate (FOSAMAX) 70 MG tablet [id number] Yes Take 1 tablet (70 mg total) by mouth every 7 (seven) days. Take with a full glass of water on an empty stomach. Susy Frizzle, MD Taking Active   atorvastatin (LIPITOR) 20 MG tablet [id number] Yes TAKE 1 TABLET BY MOUTH DAILY Susy Frizzle, MD Taking Active   cholecalciferol (VITAMIN D) 1000 UNITS tablet [id number] Yes Take 1,000 Units by mouth daily.  [provider] Taking Active Self  citalopram (CELEXA) 20 MG tablet [id number]  Yes Take 1 tablet (20 mg total) by mouth daily. Susy Frizzle, MD Taking Active   fluticasone [facility]) 50 MCG/ACT nasal spray [id number] Yes Place 2 sprays into both nostrils daily. Susy Frizzle, MD Taking Active   hydrochlorothiazide (HYDRODIURIL) 25 MG tablet [id number] Yes Take 1 tablet (25 mg total) by mouth daily. Susy Frizzle, MD Taking Active   levocetirizine (XYZAL) 5 MG tablet [id number] Yes TAKE 1 TABLET BY MOUTH EVERY DAY IN THE Reita Chard, MD Taking Active   meloxicam (MOBIC) 7.5 MG tablet [id number] Yes Take 1 tablet (7.5 mg total) by mouth daily. Susy Frizzle, MD Taking Active   [facility] injection [id number] Yes  [provider] Taking Active   XARELTO 20 MG TABS tablet [id number] No TAKE 1 TABLET BY MOUTH EVERY DAY  Patient not taking: Reported on 09/30/2022   Susy Frizzle, MD Not Taking Active             SDOH:  (Social Determinants of Health) assessments and interventions performed: {yes/no:20286} SDOH Interventions    Flowsheet Row Clinical Support from 09/30/2022 in [place]  SDOH Interventions   Food Insecurity Interventions Intervention Not Indicated  Housing Interventions Intervention Not Indicated  Transportation Interventions Intervention Not Indicated  Utilities Interventions Intervention Not Indicated  Alcohol Usage Interventions Intervention Not Indicated (Score <7)  Financial Strain Interventions Intervention Not Indicated  Stress Interventions Intervention Not Indicated  Social Connections Interventions Intervention Not Indicated       Medication Assistance: {MEDASSISTANCEINFO:25044}  Medication Access: Within the past 30 days, how often has patient missed a dose of medication? *** Is a pillbox or other method used to improve adherence? {YES/NO:21197} Factors that may affect medication adherence? {CHL DESC; BARRIERS:21522} Are meds synced by current pharmacy?  {YES/NO:21197} Are meds delivered by current pharmacy? {YES/NO:21197} Does patient experience delays in picking up medications due to transportation concerns? {YES/NO:21197}  [facility]stream Services Reviewed: Is patient disadvantaged to use [facility]?: {YES/NO:21197} Current Rx insurance plan: *** Name and location of Current pharmacy[facility]  [facility] [place] [place] [facility] [address] [place] [address] Phone: [phone] Fax: [phone]  [id number] [address]: [phone]: [address] [address] Phone: [phone] Fax: [phone]  [facility] services reviewed with patient today?: {YES/NO:21197} Patient requests to transfer care to [facility]?: {YES/NO:21197} Reason patient declined to change pharmacies: {US patient preference:27474}  Compliance/Adherence/Medication fill history: Star Rating Drugs:  Atorvastatin 20 mg last filled 07/31/2022 100 DS     Care Gaps: Annual wellness visit in last year? Yes   Assessment/Plan   Hypertension (BP goal {CHL HP UPSTREAM Pharmacist BP [phone]}) -{US controlled/uncontrolled:25276} -Current treatment: *** -Medications previously tried: ***  -Current home readings: *** -Current dietary habits: *** -Current exercise habits: *** -{ACTIONS;DENIES/REPORTS:21021675::"Denies"} hypotensive/hypertensive symptoms -Educated on {CCM BP Counseling:25124} -Counseled to monitor BP at home ***, document, and provide log at future appointments -{CCMPHARMDINTERVENTION:25122}  Hyperlipidemia: (LDL goal < ***) -{US controlled/uncontrolled:25276} -Current treatment: *** -Medications previously tried: ***  -Current  dietary patterns: *** -Current exercise habits: *** -Educated on {CCM HLD Counseling:25126} -{CCMPHARMDINTERVENTION:25122}  Osteoporosis / Osteopenia (Goal ***) -{US controlled/uncontrolled:25276} -Last DEXA Scan: ***   T-Score femoral neck: ***  T-Score total hip: ***  T-Score lumbar spine: ***  T-Score forearm radius: ***  10-year probability of major osteoporotic fracture: ***  10-year probability of hip fracture: *** -Patient {is;is not an osteoporosis candidate:23886} -Current treatment  Alendronate 6m once weekly {Appropriate:26852::"Appropriate"}, {Effective:26853::"Effective"}, {Safe:26854::"Safe"}, {accessible:26855::"Accessible"} -Medications previously tried: ***  -{Osteoporosis Counseling:23892} -{CCMPHARMDINTERVENTION:25122}  Hx of DVT (Goal: ***) -{US controlled/uncontrolled:25276} -Current treatment  None -Medications previously tried: Xarelto (d/c)  -{CCMPHARMDINTERVENTION:25122}  ***

## 2022-12-17 ENCOUNTER — Ambulatory Visit: Payer: 59 | Admitting: Pharmacist

## 2022-12-17 NOTE — Progress Notes (Signed)
Care Management & Coordination Services Pharmacy Note  12/17/2022 Name:  Wendy Newman MRN:  [id number] DOB:  Feb 17, 1941  Summary: Initial visit with PharmD.  Main concern is with knee pain, she reports meloxicam is not really helping.  Nothing has seemed to help.  Overall tolerating medications well with no concerns.  Recommended she try scheduled tylenol to see if this helps.  Will discuss injections with PCP next month.  Recommendations/Changes made from today's visit: Schedule around the clock tylenol for arthritis pain  Follow up plan: FU 6 months   Subjective: Wendy Newman is an 82 y.o. year old female who is a primary patient of Pickard, Cammie Mcgee, MD.  The care coordination team was consulted for assistance with disease management and care coordination needs.    Engaged with patient by telephone for initial visit.  Recent office visits:  09/16/2022 OV (Fam Med) Mila Merry S, FNP; no medication changes indicated.   07/24/2022 OV (PCP) Susy Frizzle, MD; I will start her on meloxicam 7.5 mg daily.    Recent consult visits:  None   Hospital visits:  None in previous 6 months   Objective:  Lab Results  Component Value Date   CREATININE 0.82 07/24/2022   BUN 18 07/24/2022   EGFR 73 07/24/2022   GFRNONAA 59 (L) 12/14/2020   GFRAA 68 12/14/2020   NA 143 07/24/2022   K 4.4 07/24/2022   CALCIUM 8.8 07/24/2022   CO2 24 07/24/2022   GLUCOSE 91 07/24/2022    No results found for: "HGBA1C", "FRUCTOSAMINE", "GFR", "MICROALBUR"  Last diabetic Eye exam: No results found for: "HMDIABEYEEXA"  Last diabetic Foot exam: No results found for: "HMDIABFOOTEX"   Lab Results  Component Value Date   CHOL 155 07/24/2022   HDL 42 (L) 07/24/2022   LDLCALC 94 07/24/2022   TRIG 98 07/24/2022   CHOLHDL 3.7 07/24/2022       Latest Ref Rng & Units 07/24/2022   10:33 AM 01/13/2022   10:36 AM 07/15/2021   10:30 AM  Hepatic Function  Total Protein 6.1 - 8.1 g/dL 6.9  6.8   7.2   AST 10 - 35 U/L 14  15  14   $ ALT 6 - 29 U/L 11  11  8   $ Total Bilirubin 0.2 - 1.2 mg/dL 0.5  0.5  0.8     Lab Results  Component Value Date/Time   TSH 4.418 06/25/2015 09:37 AM       Latest Ref Rng & Units 07/24/2022   10:33 AM 01/13/2022   10:36 AM 07/15/2021   10:30 AM  CBC  WBC 3.8 - 10.8 Thousand/uL 6.8  6.5  7.3   Hemoglobin 11.7 - 15.5 g/dL 12.3  12.8  13.0   Hematocrit 35.0 - 45.0 % 37.2  39.4  40.4   Platelets 140 - 400 Thousand/uL 215  230  261     Lab Results  Component Value Date/Time   VD25OH 42 06/25/2015 09:37 AM   VD25OH 63 06/16/2013 10:28 AM    Clinical ASCVD: No  The ASCVD Risk score (Arnett DK, et al., 2019) failed to calculate for the following reasons:   The systolic blood pressure is missing       09/30/2022    9:35 AM 07/24/2022   10:16 AM 07/24/2022   10:15 AM  Depression screen PHQ 2/9  Decreased Interest 0 0 0  Down, Depressed, Hopeless 0 0 0  PHQ - 2 Score 0 0 0  Social History   Tobacco Use  Smoking Status Never  Smokeless Tobacco Never   BP Readings from Last 3 Encounters:  09/16/22 120/60  07/24/22 126/72  05/16/22 (!) 170/89   Pulse Readings from Last 3 Encounters:  09/16/22 (!) 52  07/24/22 60  05/16/22 62   Wt Readings from Last 3 Encounters:  09/30/22 178 lb (80.7 kg)  09/16/22 180 lb (81.6 kg)  07/24/22 179 lb 6.4 oz (81.4 kg)   BMI Readings from Last 3 Encounters:  09/30/22 28.73 kg/m  09/16/22 29.05 kg/m  07/24/22 28.96 kg/m    No Known Allergies  Medications Reviewed Today     Reviewed by Edythe Clarity, [facility] (Pharmacist) on 12/17/22 at 26  Med List Status: <None>   Medication Order Taking? Sig Documenting Provider Last Dose Status Informant  alendronate (FOSAMAX) 70 MG tablet [id number] Yes Take 1 tablet (70 mg total) by mouth every 7 (seven) days. Take with a full glass of water on an empty stomach. Susy Frizzle, MD Taking Active   atorvastatin (LIPITOR) 20 MG tablet [id number] Yes  TAKE 1 TABLET BY MOUTH DAILY Susy Frizzle, MD Taking Active   cholecalciferol (VITAMIN D) 1000 UNITS tablet [id number] Yes Take 1,000 Units by mouth daily.  [provider] Taking Active Self  citalopram (CELEXA) 20 MG tablet [id number] Yes TAKE 1 TABLET BY MOUTH DAILY Pickard, Cammie Mcgee, MD Taking Active   fluticasone (FLONASE) 50 MCG/ACT nasal spray [id number] Yes Place 2 sprays into both nostrils daily. Susy Frizzle, MD Taking Active   hydrochlorothiazide (HYDRODIURIL) 25 MG tablet [id number] Yes Take 1 tablet (25 mg total) by mouth daily. Susy Frizzle, MD Taking Active   levocetirizine (XYZAL) 5 MG tablet [id number] Yes TAKE 1 TABLET BY MOUTH DAILY IN  THE Reita Chard, MD Taking Active   meloxicam (MOBIC) 7.5 MG tablet [id number] Yes Take 1 tablet (7.5 mg total) by mouth daily. Susy Frizzle, MD Taking Active   [facility] injection [id number]   [provider]  Active             SDOH:  (Social Determinants of Health) assessments and interventions performed: Yes Financial Resource Strain: Low Risk  (12/17/2022)   Overall Financial Resource Strain (CARDIA)    Difficulty of Paying Living Expenses: Not very hard   Food Insecurity: No Food Insecurity (12/17/2022)   Hunger Vital Sign    Worried About Running Out of Food in the Last Year: Never true    Ran Out of Food in the Last Year: Never true    SDOH Interventions    Flowsheet Row Clinical Support from 09/30/2022 in [place] Interventions   Food Insecurity Interventions Intervention Not Indicated  Housing Interventions Intervention Not Indicated  Transportation Interventions Intervention Not Indicated  Utilities Interventions Intervention Not Indicated  Alcohol Usage Interventions Intervention Not Indicated (Score <7)  Financial Strain Interventions Intervention Not Indicated  Stress Interventions Intervention Not Indicated  Social Connections  Interventions Intervention Not Indicated       Medication Assistance: None required.  Patient affirms current coverage meets needs.  Medication Access: Within the past 30 days, how often has patient missed a dose of medication? 0 Is a pillbox or other method used to improve adherence? Yes  Factors that may affect medication adherence? no barriers identified Are meds synced by current pharmacy? No  Are meds delivered by current pharmacy? Yes  Does patient experience delays in picking up medications due  to transportation concerns? No   Upstream Services Reviewed: Is patient disadvantaged to use [facility]?: Yes  Current Rx insurance plan: [facility] Name and location of Current pharmacy:  [place], [place] to [address] [place] [address] Phone: [phone] Fax: [phone]  [id number] [address]: [phone]: [address] [address] [address]Phone: [phone]: [phone] [facility] services reviewed with patient today?: Yes  Patient requests to transfer care to [facility]?: No  Reason patient declined to change pharmacies: Disadvantaged due to insurance/mail order  Compliance/Adherence/Medication fill history: Star Rating Drugs:  Atorvastatin 20 mg last filled 07/31/2022 100 DS     Care Gaps: Annual wellness visit in last year? Yes   Assessment/Plan   Hypertension (BP goal <140/90) -Controlled -Current treatment: HCTZ 221mAppropriate, Effective, Safe, Accessible -Medications previously tried: none noted -Current home readings: no logs discussed today -Current dietary habits:  -Current exercise habits: minimal, does not have a way to  get to the gym or anything -Denies hypotensive/hypertensive symptoms -Educated on BP goals and benefits of medications for prevention of heart attack, stroke and kidney damage; Exercise goal of 150 minutes per week; -Counseled to monitor BP at home if able, document, and provide log at future appointments -Recommended to continue current medication -Recommend to implement physical activity for pain and overall health.  Transportation is an issue - will try to find resources.  Hyperlipidemia: (LDL goal < 100) -Controlled -Current treatment: Atorvastatin 2031mppropriate, Effective, Safe, Accessible -Medications previously tried: none noted  -Educated on Cholesterol goals;  Benefits of statin for ASCVD risk reduction; Importance of limiting foods high in cholesterol; -Recommended to continue current medication LDL at goal, continue routine screenings  Osteoporosis (Goal Prevent Fracture) -Controlled, started treatment May 2022 -Last DEXA Scan: January 2022   T-Score femoral neck: -2.5  T-Score forearm radius: -0.9 -Patient is a candidate for pharmacologic treatment due to T-Score < -2.5 in femoral neck -Current treatment  Alendronate 64m36mpropriate, Query effective, ,  -Medications previously tried: none noted  -Counseled on oral bisphosphonate administration: take in the morning, 30 minutes prior to food with 6-8 oz of water. Do not lie down for at least 30 minutes after taking. Recommend weight-bearing and muscle strengthening exercises for building and maintaining bone density. -Recommended to continue current medication -She is due for updated DEXA scan as of January -Due for updated DEXA, plans to discuss with PCP at next visit.  Will be candidate for drug holiday in 2027.  Osteoarthritis (Goal: Minimze pain) -Not ideally controlled -Current treatment  Meloxicam 7.5mg 96mropriate, Query effective -Medications previously tried: none noted  - She reports not helping even  taking daily.  Discussed increasing dose but concerned about kidney function.  Recommended she try scheduled Tylenol to see if this helps at all.  She is agreeable to try.   Also recommended water therapy, she has no way to get to silver sneakers so will research transportation options.  ChrisBeverly MilchrmD, CPP Clinical Pharmacist Practitioner [place])[phone]

## 2022-12-25 ENCOUNTER — Encounter: Payer: Self-pay | Admitting: Radiology

## 2022-12-25 ENCOUNTER — Other Ambulatory Visit: Payer: Self-pay | Admitting: Family Medicine

## 2022-12-25 NOTE — Telephone Encounter (Signed)
Requested medication (s) are due for refill today: no  Requested medication (s) are on the active medication list: yes  Last refill:  07/24/22 #30 5 RF should have enough to last until 01/22/23  Future visit scheduled: yes  Notes to clinic:  Mail order requested 100 day refill - routing to office to refill or refuse this amount   Requested Prescriptions  Pending Prescriptions Disp Refills   meloxicam (MOBIC) 7.5 MG tablet [Pharmacy Med Name: Meloxicam 7.5 MG Oral Tablet] 100 tablet 2    Sig: TAKE 1 TABLET BY MOUTH DAILY     Analgesics:  COX2 Inhibitors Failed - 12/25/2022  5:20 AM      Failed - Manual Review: Labs are only required if the patient has taken medication for more than 8 weeks.      Passed - HGB in normal range and within 360 days    Hemoglobin  Date Value Ref Range Status  07/24/2022 12.3 11.7 - 15.5 g/dL Final         Passed - Cr in normal range and within 360 days    Creat  Date Value Ref Range Status  07/24/2022 0.82 0.60 - 1.00 mg/dL Final         Passed - HCT in normal range and within 360 days    HCT  Date Value Ref Range Status  07/24/2022 37.2 35.0 - 45.0 % Final         Passed - AST in normal range and within 360 days    AST  Date Value Ref Range Status  07/24/2022 14 10 - 35 U/L Final         Passed - ALT in normal range and within 360 days    ALT  Date Value Ref Range Status  07/24/2022 11 6 - 29 U/L Final         Passed - eGFR is 30 or above and within 360 days    GFR, Est African American  Date Value Ref Range Status  12/14/2020 68 > OR = 60 mL/min/1.17m Final   GFR, Est Non African American  Date Value Ref Range Status  12/14/2020 59 (L) > OR = 60 mL/min/1.759mFinal   eGFR  Date Value Ref Range Status  07/24/2022 73 > OR = 60 mL/min/1.7348minal         Passed - Patient is not pregnant      Passed - Valid encounter within last 12 months    Recent Outpatient Visits           11 months ago Benign essential HTN   [place], War[facility]ammie McgeeD   1 year ago Acute deep vein thrombosis (DVT) of popliteal vein of left lower extremity ([place] [facility] Bear[facility]   1 year ago Pure hypercholesterolemia   [place], WarCammie McgeeD   1 year ago Ptosis of right eyelid   [place], War[place]ammie McgeeD   2 years ago Pure hypercholesterolemia   [place], WarCammie McgeeD       Future Appointments             In 4 weeks Pickard, WarCammie McgeeD [place]

## 2023-01-22 ENCOUNTER — Encounter: Payer: Self-pay | Admitting: Family Medicine

## 2023-01-22 ENCOUNTER — Ambulatory Visit (INDEPENDENT_AMBULATORY_CARE_PROVIDER_SITE_OTHER): Payer: 59 | Admitting: Family Medicine

## 2023-01-22 VITALS — BP 136/86 | HR 61 | Temp 98.0°F | Ht 66.0 in | Wt 180.0 lb

## 2023-01-22 DIAGNOSIS — E785 Hyperlipidemia, unspecified: Secondary | ICD-10-CM

## 2023-01-22 DIAGNOSIS — I10 Essential (primary) hypertension: Secondary | ICD-10-CM

## 2023-01-22 NOTE — Progress Notes (Signed)
Subjective:    Patient ID: Wendy Newman, female    DOB: 04-Nov-1940, 82 y.o.   MRN: [id number]  Knee Pain    Patient is a very sweet 82 year old Caucasian female who is here today for checkup.  Since I last saw the patient, she has been battling pain in her right knee.  She has seen orthopedics who performed an MRI which showed mild tricompartmental arthritis and degeneration and fraying of her meniscus.  She has received cortisone injections that have helped periodically however she continues to have daily pain.  I tried her on meloxicam but this gave her no benefit.  She has not tried viscosupplementation yet.  She is due for fasting lab work.  Her blood pressure today is well-controlled.  She denies any chest pain shortness of breath or dyspnea on exertion Past Medical History:  Diagnosis Date   Anxiety    Cataract    DVT (deep venous thrombosis) (HCC)    Edema of both legs    Essential hypertension    Osteoporosis    Vitamin D deficiency    Past Surgical History:  Procedure Laterality Date   ABDOMINAL HYSTERECTOMY     Complete   CESAREAN SECTION     X 3   COLONOSCOPY  10/10/2008   [id number]   COLONOSCOPY WITH PROPOFOL N/A 12/21/2014   Dr. Rourk:noncompliant left colon and redundant right colon, rectal and colonic mucosa appeared normal.    Current Outpatient Medications on File Prior to Visit  Medication Sig Dispense Refill   alendronate (FOSAMAX) 70 MG tablet Take 1 tablet (70 mg total) by mouth every 7 (seven) days. Take with a full glass of water on an empty stomach. 12 tablet 2   atorvastatin (LIPITOR) 20 MG tablet TAKE 1 TABLET BY MOUTH DAILY 100 tablet 2   cholecalciferol (VITAMIN D) 1000 UNITS tablet Take 1,000 Units by mouth daily.      citalopram (CELEXA) 20 MG tablet TAKE 1 TABLET BY MOUTH DAILY 90 tablet 0   fluticasone (FLONASE) 50 MCG/ACT nasal spray Place 2 sprays into both nostrils daily. 48 g 2   hydrochlorothiazide (HYDRODIURIL) 25 MG tablet Take 1  tablet (25 mg total) by mouth daily. 90 tablet 3   levocetirizine (XYZAL) 5 MG tablet TAKE 1 TABLET BY MOUTH DAILY IN  THE EVENING 100 tablet 0   meloxicam (MOBIC) 7.5 MG tablet TAKE 1 TABLET BY MOUTH DAILY 100 tablet 2   No current facility-administered medications on file prior to visit.   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  Tobacco Use   Smoking status: Never   Smokeless tobacco: Never  Vaping Use   Vaping Use: Never used  Substance and Sexual Activity   Alcohol use: No    Alcohol/week: 0.0 standard drinks of alcohol   Drug use: No   Sexual activity: Yes    Birth control/protection: Surgical  Other Topics Concern   Not on file  Social History Narrative   Lives alone    Does not drive relies on grandson or neighbor for transportation    Social Determinants of Health   Financial Resource Strain: Low Risk  (12/17/2022)   Overall Financial Resource Strain (CARDIA)    Difficulty  of Paying Living Expenses: Not very hard  Food Insecurity: No Food Insecurity (12/17/2022)   Hunger Vital Sign    Worried About Running Out of Food in the Last Year: Never true    Ran Out of Food in the Last Year: Never true  Transportation Needs: No Transportation Needs (09/30/2022)   PRAPARE - Hydrologist (Medical): No    Lack of Transportation (Non-Medical): No  Physical Activity: Not on file  Stress: No Stress Concern Present (09/30/2022)   [place]    Feeling of Stress : Not at all  Social Connections: Socially Isolated (09/30/2022)   Social Connection and Isolation Panel [NHANES]    Frequency of Communication with Friends and Family: Twice a week    Frequency  of Social Gatherings with Friends and Family: Once a week    Attends Religious Services: Never    Marine scientist or Organizations: No    Attends Archivist Meetings: Never    Marital Status: Widowed  Intimate Partner Violence: Not At Risk (09/30/2022)   Humiliation, Afraid, Rape, and Kick questionnaire    Fear of Current or Ex-Partner: No    Emotionally Abused: No    Physically Abused: No    Sexually Abused: No      Review of Systems  All other systems reviewed and are negative.      Objective:   Physical Exam Vitals reviewed.  Constitutional:      General: She is not in acute distress.    Appearance: Normal appearance. She is normal weight. She is not ill-appearing, toxic-appearing or diaphoretic.  HENT:     Head: Normocephalic and atraumatic.     Right Ear: Tympanic membrane, ear canal and external ear normal. There is no impacted cerumen.     Left Ear: Tympanic membrane, ear canal and external ear normal. There is no impacted cerumen.     Nose: No congestion or rhinorrhea.     Mouth/Throat:     Mouth: Mucous membranes are moist.     Pharynx: Oropharynx is clear. No oropharyngeal exudate or posterior oropharyngeal erythema.  Eyes:     General:        Left eye: No discharge.     Extraocular Movements: Extraocular movements intact.     Conjunctiva/sclera: Conjunctivae normal.     Pupils: Pupils are equal, round, and reactive to light.  Neck:     Vascular: No carotid bruit.  Cardiovascular:     Rate and Rhythm: Normal rate and regular rhythm.     Pulses: Normal pulses.     Heart sounds: Normal heart sounds. No murmur heard.    No friction rub. No gallop.  Pulmonary:     Effort: Pulmonary effort is normal. No respiratory distress.     Breath sounds: Normal breath sounds. No stridor. No wheezing, rhonchi or rales.  Chest:     Chest wall: No tenderness.  Abdominal:     General: Abdomen is flat. Bowel sounds are normal. There is no distension.      Palpations: Abdomen is soft.     Tenderness: There is no abdominal tenderness. There is no guarding or rebound.  Musculoskeletal:        General: Tenderness and deformity present. No swelling.     Cervical back: Normal range of motion and neck supple. No rigidity or tenderness.     Right lower leg: No edema.     Left lower  leg: No edema.  Lymphadenopathy:     Cervical: No cervical adenopathy.  Skin:    Coloration: Skin is not jaundiced.     Findings: No bruising, erythema or rash.  Neurological:     General: No focal deficit present.     Mental Status: She is alert and oriented to person, place, and time. Mental status is at baseline.     Cranial Nerves: No cranial nerve deficit.     Sensory: No sensory deficit.     Motor: No weakness.     Coordination: Coordination normal.     Gait: Gait normal.     Deep Tendon Reflexes: Reflexes normal.  Psychiatric:        Mood and Affect: Mood normal.        Behavior: Behavior normal.        Thought Content: Thought content normal.        Judgment: Judgment normal.           Assessment & Plan:  Essential hypertension - Plan: CBC with Differential/Platelet, Lipid panel, COMPLETE METABOLIC PANEL WITH GFR  Hyperlipidemia, unspecified hyperlipidemia type Meloxicam is not helping her knee pain.  I recommended follow back up with orthopedics to discuss viscosupplementation since she tried cortisone injections with no benefit.  Check CBC CMP and lipid panel.  Blood pressure is excellent.  I would like her LDL cholesterol to be less than 100

## 2023-01-23 ENCOUNTER — Other Ambulatory Visit: Payer: Self-pay | Admitting: Family Medicine

## 2023-01-23 LAB — COMPLETE METABOLIC PANEL WITH GFR
AG Ratio: 1.5 (calc) (ref 1.0–2.5)
ALT: 9 U/L (ref 6–29)
AST: 12 U/L (ref 10–35)
Albumin: 4.3 g/dL (ref 3.6–5.1)
Alkaline phosphatase (APISO): 54 U/L (ref 37–153)
BUN: 15 mg/dL (ref 7–25)
CO2: 26 mmol/L (ref 20–32)
Calcium: 9.3 mg/dL (ref 8.6–10.4)
Chloride: 106 mmol/L (ref 98–110)
Creat: 0.92 mg/dL (ref 0.60–1.00)
Globulin: 2.8 g/dL (calc) (ref 1.9–3.7)
Glucose, Bld: 93 mg/dL (ref 65–99)
Potassium: 4.2 mmol/L (ref 3.5–5.3)
Sodium: 143 mmol/L (ref 135–146)
Total Bilirubin: 0.6 mg/dL (ref 0.2–1.2)
Total Protein: 7.1 g/dL (ref 6.1–8.1)
eGFR: 63 mL/min/{1.73_m2} (ref >=60)

## 2023-01-23 LAB — CBC WITH DIFFERENTIAL/PLATELET
Absolute Monocytes: 573 cells/uL (ref 200–950)
Basophils Absolute: 17 cells/uL (ref 0–200)
Basophils Relative: 0.2 %
Eosinophils Absolute: 91 cells/uL (ref 15–500)
Eosinophils Relative: 1.1 %
HCT: 38.5 % (ref 35.0–45.0)
Hemoglobin: 12.8 g/dL (ref 11.7–15.5)
Lymphs Abs: 2158 cells/uL (ref 850–3900)
MCH: 30 pg (ref 27.0–33.0)
MCHC: 33.2 g/dL (ref 32.0–36.0)
MCV: 90.4 fL (ref 80.0–100.0)
MPV: 12 fL (ref 7.5–12.5)
Monocytes Relative: 6.9 %
Neutro Abs: 5461 cells/uL (ref 1500–7800)
Neutrophils Relative %: 65.8 %
Platelets: 214 10*3/uL (ref 140–400)
RBC: 4.26 10*6/uL (ref 3.80–5.10)
RDW: 12.9 % (ref 11.0–15.0)
Total Lymphocyte: 26 %
WBC: 8.3 10*3/uL (ref 3.8–10.8)

## 2023-01-23 LAB — LIPID PANEL
Cholesterol: 142 mg/dL (ref <200)
HDL: 46 mg/dL — ABNORMAL LOW (ref >=50)
LDL Cholesterol (Calc): 79 mg/dL (calc)
Non-HDL Cholesterol (Calc): 96 mg/dL (calc) (ref <130)
Total CHOL/HDL Ratio: 3.1 (calc) (ref <5.0)
Triglycerides: 85 mg/dL (ref <150)

## 2023-01-23 NOTE — Telephone Encounter (Signed)
Requested Prescriptions  Pending Prescriptions Disp Refills   citalopram (CELEXA) 20 MG tablet [Pharmacy Med Name: Citalopram Hydrobromide 20 MG Oral Tablet] 90 tablet 0    Sig: TAKE 1 TABLET BY MOUTH DAILY     Psychiatry:  Antidepressants - SSRI Failed - 01/23/2023 12:10 PM      Failed - Valid encounter within last 6 months    Recent Outpatient Visits           1 year ago Benign essential HTN   [place] Susy Frizzle, MD   1 year ago Acute deep vein thrombosis (DVT) of popliteal vein of left lower extremity ([place])   [place] Eulogio Bear, NP   1 year ago Pure hypercholesterolemia   [place] Pickard, Cammie Mcgee, MD   1 year ago Ptosis of right eyelid   [place] Pickard, Cammie Mcgee, MD   2 years ago Pure hypercholesterolemia   [place] Pickard, Cammie Mcgee, MD

## 2023-02-02 ENCOUNTER — Telehealth: Payer: Self-pay

## 2023-02-02 NOTE — Telephone Encounter (Signed)
Pt called re lab results, told pt per pcp,"labs are outstanding."  Per pt voiced understanding. Nothing further.

## 2023-02-03 ENCOUNTER — Encounter: Payer: Self-pay | Admitting: Orthopedic Surgery

## 2023-02-03 ENCOUNTER — Ambulatory Visit (INDEPENDENT_AMBULATORY_CARE_PROVIDER_SITE_OTHER): Payer: 59 | Admitting: Orthopedic Surgery

## 2023-02-03 VITALS — BP 151/86 | HR 60 | Ht 66.0 in | Wt 169.0 lb

## 2023-02-03 DIAGNOSIS — G8929 Other chronic pain: Secondary | ICD-10-CM | POA: Diagnosis not present

## 2023-02-03 DIAGNOSIS — M25561 Pain in right knee: Secondary | ICD-10-CM

## 2023-02-03 NOTE — Patient Instructions (Signed)
You were fitted for a brace in clinic today.  Recommend using this at all times to start, then you can use it when you are active.  We will place referral for physical therapy.  This can help with some of the pain in your knee.  Consider returning for a steroid injection.  Consider returning for hyaluronic acid injection, which would require insurance authorization prior to scheduling a visit.

## 2023-02-03 NOTE — Progress Notes (Signed)
Return patient Visit  Assessment: Wendy Newman is a 82 y.o. female with the following: Acute right knee pain  Plan: [name] continues to have right knee pain.  Previous ultrasound was negative for Baker's cyst.  MRI without obvious injury, including mild degenerative changes throughout the knee.  No recent injury.  Mild swelling on physical exam today.  She was fitted for a brace, and I recommended physical therapy.  She can consider returning for another cortisone injection, or possibly obtain authorization for hyaluronic acid injections.  She will follow-up as needed.   Follow-up: Return if symptoms worsen or fail to improve.  Subjective:  Chief Complaint  Patient presents with   Knee Pain    Rt swelling and pain that's gotten worse in the past few months.     History of Present Illness: Wendy Newman is a 82 y.o. female who returns to clinic for repeat evaluation of her right knee.  It has been several months since I saw her last in clinic.  She continues to have intermittent right knee pain.  She was evaluated in the emergency department in November, with no change in her x-rays.  Her primary care doctor recommended she return for repeat evaluation.  She is currently complaining of pain over the medial, and anterior knee.  Prior steroid injection only provided minimal relief for short period of time.  She has tried a brace, but felt that it was too tight, and put pressure on the knee which was more painful.   Review of Systems: No fevers or chills.   No numbness or tingling No chest pain No shortness of breath No bowel or bladder dysfunction No GI distress No headaches  Objective: BP (!) 151/86   Pulse 60   Ht 5\' 6"  (1.676 m)   Wt 169 lb (76.7 kg)   BMI 27.28 kg/m   Physical Exam:  General: Alert and oriented. and No acute distress. Gait: Right sided antalgic gait.  No swelling of the right knee.  Valgus alignment overall.  No increased laxity varus or  valgus stress.  Tenderness palpation along the medial knee.  Tenderness to palpation over the anterior knee.  Negative Lachman.  She has good range of motion.  IMAGING: No new imaging obtained today  New Medications:  No orders of the defined types were placed in this encounter.     Oliver Barre, MD  02/03/2023 10:13 AM

## 2023-02-10 ENCOUNTER — Ambulatory Visit
Admission: EM | Admit: 2023-02-10 | Discharge: 2023-02-10 | Disposition: A | Discharge status: Home / Self Care | Payer: 59 | Attending: Nurse Practitioner | Admitting: Nurse Practitioner

## 2023-02-10 DIAGNOSIS — N3001 Acute cystitis with hematuria: Secondary | ICD-10-CM | POA: Diagnosis not present

## 2023-02-10 LAB — POCT URINALYSIS DIP (MANUAL ENTRY)
Bilirubin, UA: NEGATIVE
Glucose, UA: NEGATIVE mg/dL
Ketones, POC UA: NEGATIVE mg/dL
Nitrite, UA: POSITIVE — AB
Protein Ur, POC: >=300 mg/dL — AB
Spec Grav, UA: 1.02 (ref 1.010–1.025)
Urobilinogen, UA: 0.2 E.U./dL
pH, UA: 7.5 (ref 5.0–8.0)

## 2023-02-10 MED ORDER — CEPHALEXIN 500 MG PO CAPS: 500.0000 mg | ORAL_CAPSULE | Freq: Two times a day (BID) | ORAL | 0 refills | Status: AC

## 2023-02-10 MED ORDER — PHENAZOPYRIDINE HCL 100 MG PO TABS: 100.0000 mg | ORAL_TABLET | Freq: Three times a day (TID) | ORAL | 0 refills | Status: DC | PRN

## 2023-02-10 NOTE — ED Triage Notes (Signed)
Pt reports she has had blood in her urine, burning when urinating, low abdominal pain, and a fever x 3 days

## 2023-02-10 NOTE — Discharge Instructions (Signed)
You have a UTI; take the Keflex as prescribed to treat it.  You can take the pyridium every 8 hours as needed for bladder pain; this will dye your urine bright orange so do not be alarmed.  If you develop high fever that does not improve with Tylenol, severe pain, or nausea/vomiting and are unable to keep fluids down, call 911 or go to the ER.

## 2023-02-10 NOTE — ED Provider Notes (Signed)
[facility]    CSN: [id number] Arrival date & time: 02/10/23  0818      History   Chief Complaint No chief complaint on file.   HPI Wendy Newman is a 82 y.o. female.   Patient presents today with a 3-day history of dysuria, urinary frequency and urgency, voiding smaller amounts, urinary incontinence, hematuria, tactile fever.  No new back injury, fall, trauma, or back pain.  No abdominal pain, nausea/vomiting, vaginal discharge decreased appetite.  Reports she took Tylenol earlier this morning which helped with the fever.  Has been drinking Cran apple juice and trying to drink more water which has not helped with symptoms.  She has not tried Azo.  She denies history of UTI or recurrent UTI.  Patient denies antibiotic use in the past 90 days.  Denies allergy to antibiotic therapy.    Past Medical History:  Diagnosis Date   Anxiety    Cataract    DVT (deep venous thrombosis)    Edema of both legs    Essential hypertension    Osteoporosis    Vitamin D deficiency     Patient Active Problem List   Diagnosis Date Noted   Acute deep vein thrombosis (DVT) of popliteal vein of left lower extremity 10/02/2021   Allergic rhinoconjunctivitis 07/02/2017   Family history of colonic polyps 11/28/2014   Family history of colon cancer 11/28/2014   Constipation 11/28/2014   Vitamin D deficiency 06/16/2013   Hypertension    Anxiety    Osteoporosis    Cataract    Hyperlipemia 05/18/2009   Anxiety state 05/18/2009   NECK PAIN 05/18/2009   ALLERGIC RHINITIS 08/28/2008   OSTEOARTHRITIS 08/28/2008   POSTMENOPAUSAL OSTEOPOROSIS 08/28/2008   PAT 04/24/2008   OTHER ABNORMAL BLOOD CHEMISTRY 04/24/2008   Essential hypertension 12/16/2007   HEADACHE 12/16/2007    Past Surgical History:  Procedure Laterality Date   ABDOMINAL HYSTERECTOMY     Complete   CESAREAN SECTION     X 3   COLONOSCOPY  10/10/2008   [id number]   COLONOSCOPY WITH PROPOFOL N/A 12/21/2014   Dr.  Rourk:noncompliant left colon and redundant right colon, rectal and colonic mucosa appeared normal.     OB History     Gravida  3   Para  3   Term  3   Preterm      AB      Living  3      SAB      IAB      Ectopic      Multiple      Live Births               Home Medications    Prior to Admission medications   Medication Sig Start Date End Date Taking? Authorizing Provider  cephALEXin (KEFLEX) 500 MG capsule Take 1 capsule (500 mg total) by mouth 2 (two) times daily for 5 days. 02/10/23 02/15/23 Yes Valentino Nose, NP  phenazopyridine (PYRIDIUM) 100 MG tablet Take 1 tablet (100 mg total) by mouth 3 (three) times daily as needed for pain. 02/10/23  Yes Valentino Nose, NP  alendronate (FOSAMAX) 70 MG tablet Take 1 tablet (70 mg total) by mouth every 7 (seven) days. Take with a full glass of water on an empty stomach. 02/28/22   Donita Brooks, MD  atorvastatin (LIPITOR) 20 MG tablet TAKE 1 TABLET BY MOUTH DAILY 07/28/22   Donita Brooks, MD  cholecalciferol (VITAMIN D) 1000 UNITS tablet Take  1,000 Units by mouth daily.     [provider]  citalopram (CELEXA) 20 MG tablet TAKE 1 TABLET BY MOUTH DAILY 01/23/23   Donita Brooks, MD  fluticasone [facility]) 50 MCG/ACT nasal spray Place 2 sprays into both nostrils daily. 02/28/22   Donita Brooks, MD  hydrochlorothiazide (HYDRODIURIL) 25 MG tablet Take 1 tablet (25 mg total) by mouth daily. 05/15/22   Donita Brooks, MD  levocetirizine (XYZAL) 5 MG tablet TAKE 1 TABLET BY MOUTH DAILY IN  THE EVENING 11/20/22   Donita Brooks, MD  meloxicam (MOBIC) 7.5 MG tablet TAKE 1 TABLET BY MOUTH DAILY 01/01/23   Donita Brooks, MD    Family History Family History  Problem Relation Age of Onset   Cancer Mother 72       Breast. age 46.    Colon polyps Mother    Heart disease Father 46       CABG   Cancer Sister 82       Lung   Cancer Brother 75       Started in the back   Colon cancer Other         Age 6s. maternal grandmother    Social History Social History   Tobacco Use   Smoking status: Never   Smokeless tobacco: Never  Vaping Use   Vaping Use: Never used  Substance Use Topics   Alcohol use: No    Alcohol/week: 0.0 standard drinks of alcohol   Drug use: No     Allergies   Patient has no known allergies.   Review of Systems Review of Systems Per HPI  Physical Exam Triage Vital Signs ED Triage Vitals  Enc Vitals Group     BP 02/10/23 0901 (!) 162/83     Pulse Rate 02/10/23 0901 65     Resp 02/10/23 0901 20     Temp 02/10/23 0901 98.5 F (36.9 C)     Temp Source 02/10/23 0901 Oral     SpO2 02/10/23 0901 94 %     Weight --      Height --      Head Circumference --      Peak Flow --      Pain Score 02/10/23 0902 0     Pain Loc --      Pain Edu? --      Excl. in GC? --    No data found.  Updated Vital Signs BP (!) 162/83 (BP Location: Right Arm)   Pulse 65   Temp 98.5 F (36.9 C) (Oral)   Resp 20   SpO2 94%   Visual Acuity Right Eye Distance:   Left Eye Distance:   Bilateral Distance:    Right Eye Near:   Left Eye Near:    Bilateral Near:     Physical Exam Vitals and nursing note reviewed.  Constitutional:      General: She is not in acute distress.    Appearance: She is not toxic-appearing.  Pulmonary:     Effort: Pulmonary effort is normal. No respiratory distress.  Abdominal:     General: Abdomen is flat. Bowel sounds are normal. There is no distension.     Palpations: Abdomen is soft. There is no mass.     Tenderness: There is no abdominal tenderness. There is no right CVA tenderness, left CVA tenderness or guarding.  Skin:    General: Skin is warm and dry.     Coloration: Skin is not jaundiced  or pale.     Findings: No erythema.  Neurological:     Mental Status: She is alert and oriented to person, place, and time.     Motor: No weakness.     Gait: Gait normal.  Psychiatric:        Behavior: Behavior is cooperative.       UC Treatments / Results  Labs (all labs ordered are listed, but only abnormal results are displayed) Labs Reviewed  POCT URINALYSIS DIP (MANUAL ENTRY) - Abnormal; Notable for the following components:      Result Value   Clarity, UA cloudy (*)    Blood, UA large (*)    Protein Ur, POC >=300 (*)    Nitrite, UA Positive (*)    Leukocytes, UA Large (3+) (*)    All other components within normal limits  URINE CULTURE    EKG   Radiology No results found.  Procedures Procedures (including critical care time)  Medications Ordered in UC Medications - No data to display  Initial Impression / Assessment and Plan / UC Course  I have reviewed the triage vital signs and the nursing notes.  Pertinent labs & imaging results that were available during my care of the patient were reviewed by me and considered in my medical decision making (see chart for details).   Patient is well-appearing, afebrile, not tachycardic, not tachypneic, oxygenating well on room air.  Patient is mildly hypertensive today in triage.  1. Acute cystitis with hematuria Urinalysis today is positive for nitrates Urine culture is pending Will treat with Keflex twice daily for 5 days Start Pyridium as needed for bladder pain/urinary symptoms Strict ER precautions discussed with patient  The patient was given the opportunity to ask questions.  All questions answered to their satisfaction.  The patient is in agreement to this plan.    Final Clinical Impressions(s) / UC Diagnoses   Final diagnoses:  Acute cystitis with hematuria     Discharge Instructions      You have a UTI; take the Keflex as prescribed to treat it.  You can take the pyridium every 8 hours as needed for bladder pain; this will dye your urine bright orange so do not be alarmed.  If you develop high fever that does not improve with Tylenol, severe pain, or nausea/vomiting and are unable to keep fluids down, call 911 or go to the  ER.     ED Prescriptions     Medication Sig Dispense Auth. Provider   cephALEXin (KEFLEX) 500 MG capsule Take 1 capsule (500 mg total) by mouth 2 (two) times daily for 5 days. 10 capsule Cathlean Marseilles A, NP   phenazopyridine (PYRIDIUM) 100 MG tablet Take 1 tablet (100 mg total) by mouth 3 (three) times daily as needed for pain. 10 tablet Valentino Nose, NP      PDMP not reviewed this encounter.   Valentino Nose, NP 02/10/23 [phone]

## 2023-02-12 LAB — URINE CULTURE: Culture: >=100000 — AB

## 2023-02-17 IMAGING — DX DG TIBIA/FIBULA 2V*L*
2 series · 2 of 2 positions shown · non-contrast
Comparison: None.

CLINICAL DATA: left proximal tibia injury 1 month ago

EXAM:
LEFT TIBIA AND FIBULA - 2 VIEW

[tibia ap]
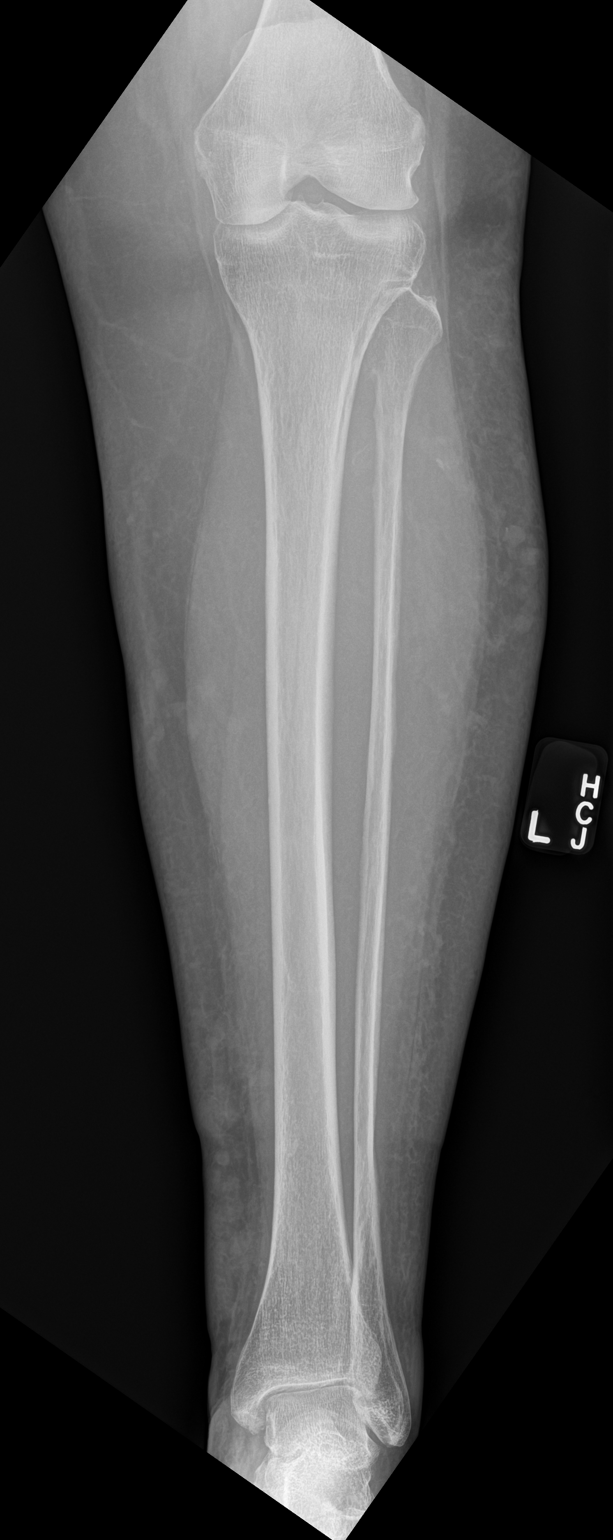

[tibia lat]
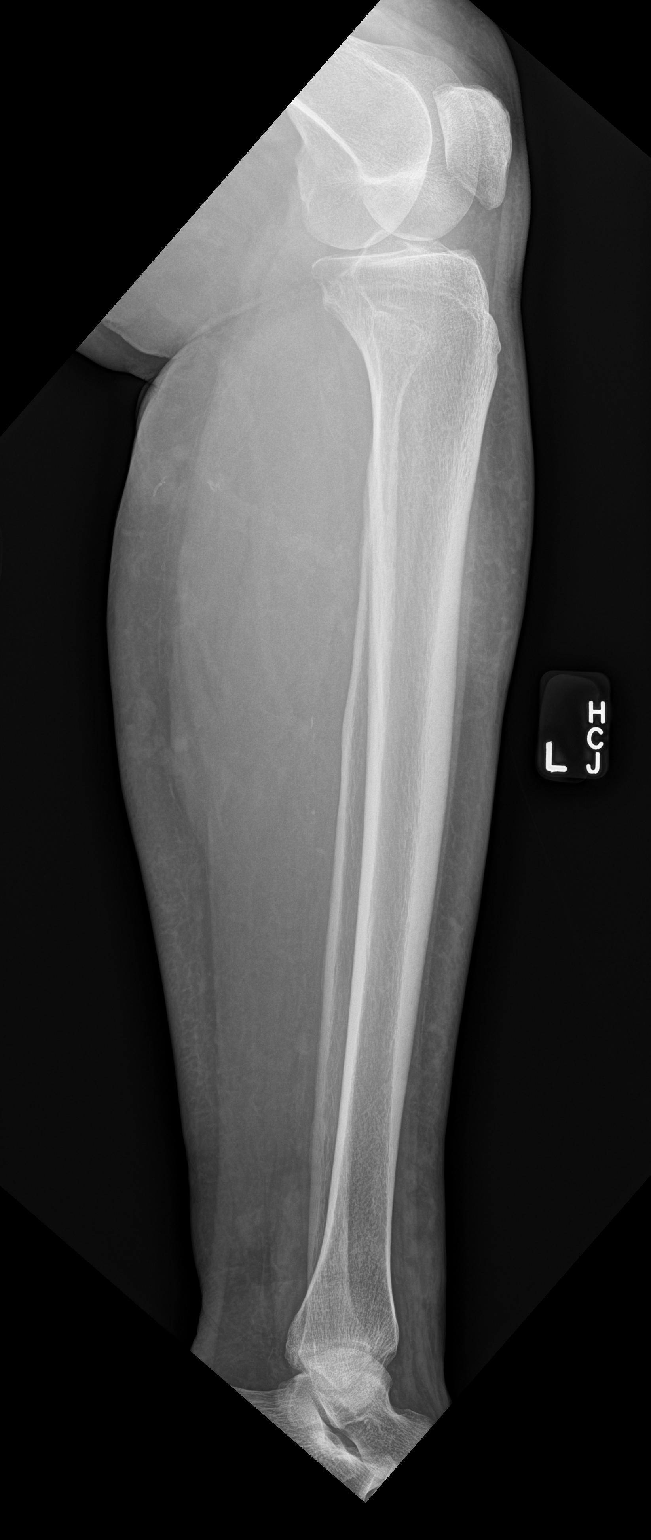

[2 of 2 positions shown; findings below may reference images not displayed]

FINDINGS: There is no acute osseous abnormality. No focal bone lesion
identified. There is mild lower extremity soft tissue swelling and
suggestion of superficial varicosities.
IMPRESSION: No acute osseous abnormality.

## 2023-02-24 ENCOUNTER — Ambulatory Visit (HOSPITAL_COMMUNITY): Payer: 59 | Admitting: Physical Therapy

## 2023-03-11 ENCOUNTER — Other Ambulatory Visit: Payer: Self-pay | Admitting: Family Medicine

## 2023-03-19 ENCOUNTER — Other Ambulatory Visit: Payer: Self-pay | Admitting: Family Medicine

## 2023-03-20 NOTE — Telephone Encounter (Signed)
Requested Prescriptions  Pending Prescriptions Disp Refills   citalopram (CELEXA) 20 MG tablet [Pharmacy Med Name: Citalopram Hydrobromide 20 MG Oral Tablet] 90 tablet 1    Sig: TAKE 1 TABLET BY MOUTH DAILY     Psychiatry:  Antidepressants - SSRI Failed - 03/19/2023 10:29 PM      Failed - Valid encounter within last 6 months    Recent Outpatient Visits           1 year ago Benign essential HTN   [facility] Donita Brooks, MD   1 year ago Acute deep vein thrombosis (DVT) of popliteal vein of left lower extremity (HCC)   [facility] Valentino Nose, NP   1 year ago Pure hypercholesterolemia   [facility] Pickard, Priscille Heidelberg, MD   1 year ago Ptosis of right eyelid   [facility] Pickard, Priscille Heidelberg, MD   2 years ago Pure hypercholesterolemia   [facility] Pickard, Priscille Heidelberg, MD

## 2023-03-25 ENCOUNTER — Ambulatory Visit (HOSPITAL_COMMUNITY): Payer: 59 | Attending: Orthopedic Surgery

## 2023-03-25 ENCOUNTER — Other Ambulatory Visit: Payer: Self-pay

## 2023-03-25 DIAGNOSIS — G8929 Other chronic pain: Secondary | ICD-10-CM | POA: Diagnosis not present

## 2023-03-25 DIAGNOSIS — R262 Difficulty in walking, not elsewhere classified: Secondary | ICD-10-CM | POA: Diagnosis not present

## 2023-03-25 DIAGNOSIS — M25561 Pain in right knee: Secondary | ICD-10-CM | POA: Diagnosis not present

## 2023-03-25 NOTE — Therapy (Signed)
OUTPATIENT PHYSICAL THERAPY LOWER EXTREMITY EVALUATION   Patient Name: Wendy Newman MRN: [id number] DOB:1941-01-01, 82 y.o., female Today's Date: 03/25/2023  END OF SESSION:  PT End of Session - 03/25/23 0949     Visit Number 1    Number of Visits 8    Date for PT Re-Evaluation 05/01/23    Authorization Type [employer] Medicare    Progress Note Due on Visit 10    PT Start Time [phone]    PT Stop Time 1028    PT Time Calculation (min) 42 min    Activity Tolerance Patient tolerated treatment well    Behavior During Therapy WFL for tasks assessed/performed             Past Medical History:  Diagnosis Date   Anxiety    Cataract    DVT (deep venous thrombosis) (HCC)    Edema of both legs    Essential hypertension    Osteoporosis    Vitamin D deficiency    Past Surgical History:  Procedure Laterality Date   ABDOMINAL HYSTERECTOMY     Complete   CESAREAN SECTION     X 3   COLONOSCOPY  10/10/2008   WNU:[id number]   COLONOSCOPY WITH PROPOFOL N/A 12/21/2014   Dr. Rourk:noncompliant left colon and redundant right colon, rectal and colonic mucosa appeared normal.    Patient Active Problem List   Diagnosis Date Noted   Acute deep vein thrombosis (DVT) of popliteal vein of left lower extremity (HCC) 10/02/2021   Allergic rhinoconjunctivitis 07/02/2017   Family history of colonic polyps 11/28/2014   Family history of colon cancer 11/28/2014   Constipation 11/28/2014   Vitamin D deficiency 06/16/2013   Hypertension    Anxiety    Osteoporosis    Cataract    Hyperlipemia 05/18/2009   Anxiety state 05/18/2009   NECK PAIN 05/18/2009   ALLERGIC RHINITIS 08/28/2008   OSTEOARTHRITIS 08/28/2008   POSTMENOPAUSAL OSTEOPOROSIS 08/28/2008   PAT 04/24/2008   OTHER ABNORMAL BLOOD CHEMISTRY 04/24/2008   Essential hypertension 12/16/2007   HEADACHE 12/16/2007    PCP: Lynnea Ferrier, MD  REFERRING PROVIDER: Oliver Barre, MD  REFERRING DIAG: [phone] (ICD-10-CM) - Chronic  pain of right knee  THERAPY DIAG:  Chronic pain of right knee  Difficulty in walking, not elsewhere classified  Rationale for Evaluation and Treatment: Rehabilitation  ONSET DATE: 2 years  SUBJECTIVE:   SUBJECTIVE STATEMENT: Patient reports knee pain for about 2 years; she states she started seeing Dr. Dallas Schimke about 2 years ago and started having injections; has had 2 injections which help some; also gave her a brace and referred her to therapy.   PERTINENT HISTORY: Osteoporisis Recent widower PAIN:  Are you having pain? Yes: NPRS scale: 7/10 Pain location: right knee Pain description: burning Aggravating factors: sitting for a long time Relieving factors: movement, tylenol  PRECAUTIONS: Fall  WEIGHT BEARING RESTRICTIONS: No  FALLS:  Has patient fallen in last 6 months? Yes. Number of falls 1   OCCUPATION: retired   PLOF: Independent  PATIENT GOALS: help my knee get better  NEXT MD VISIT: prn  OBJECTIVE:   DIAGNOSTIC FINDINGS:   CLINICAL DATA:  Fell on Saturday, RIGHT knee trauma, acute RIGHT knee pain   EXAM: RIGHT KNEE - 3 VIEW   COMPARISON:  01/04/2022   FINDINGS: Osseous demineralization.   Slight narrowing of joint spaces.   No acute fracture, dislocation, or bone destruction.   No joint effusion.   IMPRESSION: Mild degenerative changes RIGHT knee without  acute bony abnormalities.  CLINICAL DATA:  Chronic right knee pain.   EXAM: MRI OF THE RIGHT KNEE WITHOUT CONTRAST   TECHNIQUE: Multiplanar, multisequence MR imaging of the knee was performed. No intravenous contrast was administered.   COMPARISON:  Right knee x-rays dated January 04, 2022.   FINDINGS: MENISCI   Medial meniscus:  Intact.   Lateral meniscus: Degeneration and free edge fraying of the anterior and posterior roots without discrete tear. No extrusion.   LIGAMENTS   Cruciates:  Intact ACL and PCL.   Collaterals: Medial collateral ligament is intact.  Lateral collateral ligament complex is intact.   CARTILAGE   Patellofemoral: Mild diffuse cartilage thinning without focal defect.   Medial:  Mild diffuse cartilage thinning without focal defect.   Lateral: Mild partial-thickness cartilage loss over the weight-bearing lateral femoral condyle and lateral tibial plateau. Near full-thickness cartilage loss over the posterior nonweightbearing lateral femoral condyle.   Joint: Small joint effusion. Mild edema within Hoffa's fat anterior to the lateral meniscus anterior horn.   Popliteal Fossa:  No Baker cyst. Intact popliteus tendon.   Extensor Mechanism: Intact quadriceps tendon and patellar tendon. Intact medial and lateral patellar retinaculum. Intact MPFL.   Bones: No acute fracture or dislocation. No suspicious bone lesion. Tiny tricompartmental marginal osteophytes.   Other: None.   IMPRESSION: 1. 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 2. Mild tricompartmental osteoarthritis, greatest in the lateral compartment. 3. Small joint effusion.   PATIENT SURVEYS:  FOTO 63  COGNITION: Overall cognitive status: Within functional limits for tasks assessed     SENSATION: WFL  EDEMA:  Yes right knee   PALPATION: Tender right knee lateral joint line and posterior capsule  LOWER EXTREMITY ROM:  Active ROM Right eval Left eval  Hip flexion    Hip extension    Hip abduction    Hip adduction    Hip internal rotation    Hip external rotation    Knee flexion 109* 124  Knee extension -8* 0  Ankle dorsiflexion    Ankle plantarflexion    Ankle inversion    Ankle eversion     (Blank rows = not tested)  LOWER EXTREMITY MMT:  MMT Right eval Left eval  Hip flexion 4+ 5  Hip extension    Hip abduction    Hip adduction    Hip internal rotation    Hip external rotation    Knee flexion(sitting isometric) 4* 5  Knee extension 4-* 5  Ankle  dorsiflexion 4+ 5  Ankle plantarflexion    Ankle inversion    Ankle eversion     (Blank rows = not tested)   FUNCTIONAL TESTS:  5 times sit to stand: 29.08 sec with weight shifted to left knee 2 minute walk test: 225 ft no AD  GAIT: Distance walked: 225 ft Assistive device utilized: Single point cane Level of assistance: Modified independence Comments: slower than normal gait speed; right knee valgus   TODAY'S TREATMENT:  DATE: 03/25/2023 physical therapy evaluation and HEP instruction    PATIENT EDUCATION:  Education details: Patient educated on exam findings, POC, scope of PT, HEP, and what to expect next visit. Person educated: Patient Education method: Explanation, Demonstration, and Handouts Education comprehension: verbalized understanding, returned demonstration, verbal cues required, and tactile cues required  HOME EXERCISE PROGRAM: Access Code: [email or web address] URL: [email or web address] Date: 03/25/2023 Prepared by: AP - Rehab  Exercises - Supine Quad Set  - 2 x daily - 7 x weekly - 1 sets - 10 reps - 5 sec hold - Long Sitting Calf Stretch with Strap  - 2 x daily - 7 x weekly - 1 sets - 5 reps - 20 sec hold - Supine Knee Extension Stretch on Towel Roll  - 2 x daily - 7 x weekly - 1 sets - 1 reps - 1 min hold  ASSESSMENT:  CLINICAL IMPRESSION: Patient is a 82 y.o. female who was seen today for physical therapy evaluation and treatment for right knee pain. Patient demonstrates muscle weakness, reduced ROM, and fascial restrictions which are likely contributing to symptoms of pain and are negatively impacting patient ability to perform ADLs and functional mobility tasks. Patient will benefit from skilled physical therapy services to address these deficits to reduce pain and improve level of function with ADLs and functional mobility  tasks.   OBJECTIVE IMPAIRMENTS: Abnormal gait, decreased activity tolerance, decreased mobility, difficulty walking, decreased ROM, decreased strength, hypomobility, increased edema, increased fascial restrictions, impaired perceived functional ability, impaired flexibility, and pain.   ACTIVITY LIMITATIONS: carrying, lifting, bending, sitting, standing, squatting, sleeping, stairs, transfers, locomotion level, and caring for others  PARTICIPATION LIMITATIONS: meal prep, cleaning, laundry, shopping, community activity, and yard work  [facility] POTENTIAL: Good  CLINICAL DECISION MAKING: Stable/uncomplicated  EVALUATION COMPLEXITY: Low   GOALS: Goals reviewed with patient? No  SHORT TERM GOALS: Target date: 04/08/2023 patient will be independent with initial HEP  Baseline: Goal status: INITIAL  2.  Patient will self report 30% improvement to improve tolerance for functional activity  Baseline:  Goal status: INITIAL  LONG TERM GOALS: Target date: 05/01/2023  Patient will be independent in self management strategies to improve quality of life and functional outcomes.  Baseline:  Goal status: INITIAL  2.  Patient will self report 50% improvement to improve tolerance for functional activity  Baseline:  Goal status: INITIAL  3.  Patient will increased right knee mobility to -2 to 120 to promote normal navigation of steps; step over step pattern   Baseline:  Goal status: INITIAL  4.  Patient will increase right leg MMTs to 5/5 without pain to promote return to ambulation community distances with minimal deviation.  Baseline:  Goal status: INITIAL  5.  Patient will improve FOTO score to 70   Baseline: 63 Goal status: INITIAL  6.  Patient will increase distance on to 275 ft  to demonstrate improved functional mobility walking household and community distances.   Baseline: 225 ft Goal status: INITIAL   PLAN:  PT FREQUENCY: 2x/week  PT DURATION: 4  weeks  PLANNED INTERVENTIONS: Therapeutic exercises, Therapeutic activity, Neuromuscular re-education, Balance training, Gait training, Patient/Family education, Joint manipulation, Joint mobilization, Stair training, Orthotic/Fit training, DME instructions, Aquatic Therapy, Dry Needling, Electrical stimulation, Spinal manipulation, Spinal mobilization, Cryotherapy, Moist heat, Compression bandaging, scar mobilization, Splintting, Taping, Traction, Ultrasound, Ionotophoresis 4mg /ml Dexamethasone, and Manual therapy   PLAN FOR NEXT SESSION: Review HEP and goals; progress right knee mobility and strength as able  10:39 AM, 03/25/23   Small  MPT [place] [facility] [address] [phone]

## 2023-03-27 ENCOUNTER — Other Ambulatory Visit: Payer: Self-pay | Admitting: Family Medicine

## 2023-03-27 DIAGNOSIS — E7800 Pure hypercholesterolemia, unspecified: Secondary | ICD-10-CM

## 2023-03-30 NOTE — Telephone Encounter (Signed)
Requested Prescriptions  Pending Prescriptions Disp Refills   atorvastatin (LIPITOR) 20 MG tablet [Pharmacy Med Name: Atorvastatin Calcium 20 MG Oral Tablet] 100 tablet 2    Sig: TAKE 1 TABLET BY MOUTH ONCE  DAILY     Cardiovascular:  Antilipid - Statins Failed - 03/27/2023 10:18 PM      Failed - Valid encounter within last 12 months    Recent Outpatient Visits           1 year ago Benign essential HTN   [facility] Donita Brooks, MD   1 year ago Acute deep vein thrombosis (DVT) of popliteal vein of left lower extremity (HCC)   [facility] Valentino Nose, NP   1 year ago Pure hypercholesterolemia   [facility] Pickard, Priscille Heidelberg, MD   2 years ago Ptosis of right eyelid   [facility] Pickard, Priscille Heidelberg, MD   2 years ago Pure hypercholesterolemia   [facility] Tanya Nones, Priscille Heidelberg, MD              Failed - Lipid Panel in normal range within the last 12 months    Cholesterol  Date Value Ref Range Status  01/22/2023 142 <200 mg/dL Final   LDL Cholesterol (Calc)  Date Value Ref Range Status  01/22/2023 79 mg/dL (calc) Final    Comment:    Reference range: <100 . Desirable range <100 mg/dL for primary prevention;   <70 mg/dL for patients with CHD or diabetic patients  with > or = 2 CHD risk factors. Marland Kitchen LDL-C is now calculated using the Martin-Hopkins  calculation, which is a validated novel method providing  better accuracy than the Friedewald equation in the  estimation of LDL-C.  Horald Pollen et al. Lenox Ahr. 1610;960(45): 2061-2068  (http://education.[employer].com/faq/FAQ164)    HDL  Date Value Ref Range Status  01/22/2023 46 (L) > OR = 50 mg/dL Final   Triglycerides  Date Value Ref Range Status  01/22/2023 85 <150 mg/dL Final         Passed - Patient is not pregnant

## 2023-03-31 ENCOUNTER — Ambulatory Visit (HOSPITAL_COMMUNITY): Payer: Medicare PPO | Attending: Orthopedic Surgery | Admitting: Physical Therapy

## 2023-03-31 DIAGNOSIS — G8929 Other chronic pain: Secondary | ICD-10-CM | POA: Insufficient documentation

## 2023-03-31 DIAGNOSIS — R262 Difficulty in walking, not elsewhere classified: Secondary | ICD-10-CM | POA: Diagnosis not present

## 2023-03-31 DIAGNOSIS — M25561 Pain in right knee: Secondary | ICD-10-CM | POA: Diagnosis not present

## 2023-03-31 NOTE — Therapy (Signed)
OUTPATIENT PHYSICAL THERAPY LOWER EXTREMITY TREATMENT   Patient Name: Wendy Newman MRN: [id number] DOB:April 11, 1941, 82 y.o., female Today's Date: 03/31/2023  END OF SESSION:  PT End of Session - 03/31/23 0831     Visit Number 2    Number of Visits 8    Date for PT Re-Evaluation 05/01/23    Authorization Type [employer]HC Medicare    Progress Note Due on Visit 10    PT Start Time 0818    PT Stop Time 0858    PT Time Calculation (min) 40 min    Activity Tolerance Patient tolerated treatment well    Behavior During Therapy WFL for tasks assessed/performed             Past Medical History:  Diagnosis Date   Anxiety    Cataract    DVT (deep venous thrombosis) (HCC)    Edema of both legs    Essential hypertension    Osteoporosis    Vitamin D deficiency    Past Surgical History:  Procedure Laterality Date   ABDOMINAL HYSTERECTOMY     Complete   CESAREAN SECTION     X 3   COLONOSCOPY  10/10/2008   WGN:[id number]   COLONOSCOPY WITH PROPOFOL N/A 12/21/2014   Dr. Rourk:noncompliant left colon and redundant right colon, rectal and colonic mucosa appeared normal.    Patient Active Problem List   Diagnosis Date Noted   Acute deep vein thrombosis (DVT) of popliteal vein of left lower extremity (HCC) 10/02/2021   Allergic rhinoconjunctivitis 07/02/2017   Family history of colonic polyps 11/28/2014   Family history of colon cancer 11/28/2014   Constipation 11/28/2014   Vitamin D deficiency 06/16/2013   Hypertension    Anxiety    Osteoporosis    Cataract    Hyperlipemia 05/18/2009   Anxiety state 05/18/2009   NECK PAIN 05/18/2009   ALLERGIC RHINITIS 08/28/2008   OSTEOARTHRITIS 08/28/2008   POSTMENOPAUSAL OSTEOPOROSIS 08/28/2008   PAT 04/24/2008   OTHER ABNORMAL BLOOD CHEMISTRY 04/24/2008   Essential hypertension 12/16/2007   HEADACHE 12/16/2007    PCP: Lynnea Ferrier, MD  REFERRING PROVIDER: Oliver Barre, MD  REFERRING DIAG: [phone] (ICD-10-CM) - Chronic  pain of right knee  THERAPY DIAG:  Chronic pain of right knee  Difficulty in walking, not elsewhere classified  Rationale for Evaluation and Treatment: Rehabilitation  ONSET DATE: 2 years  SUBJECTIVE:   SUBJECTIVE STATEMENT: Pt states her knee always hurts. Reports compliance with HEP.  Evaluation: Patient reports knee pain for about 2 years; she states she started seeing Dr. Dallas Schimke about 2 years ago and started having injections; has had 2 injections which help some; also gave her a brace and referred her to therapy.   PERTINENT HISTORY: Osteoporisis Recent widower PAIN:  Are you having pain? Yes: NPRS scale: 7/10 Pain location: right knee Pain description: burning Aggravating factors: sitting for a long time Relieving factors: movement, tylenol  PRECAUTIONS: Fall  WEIGHT BEARING RESTRICTIONS: No  FALLS:  Has patient fallen in last 6 months? Yes. Number of falls 1   OCCUPATION: retired   PLOF: Independent  PATIENT GOALS: help my knee get better  NEXT MD VISIT: prn  OBJECTIVE:   DIAGNOSTIC FINDINGS:   CLINICAL DATA:  Fell on Saturday, RIGHT knee trauma, acute RIGHT knee pain   EXAM: RIGHT KNEE - 3 VIEW   COMPARISON:  01/04/2022   FINDINGS: Osseous demineralization.   Slight narrowing of joint spaces.   No acute fracture, dislocation, or bone destruction.  No joint effusion.   IMPRESSION: Mild degenerative changes RIGHT knee without acute bony abnormalities.  CLINICAL DATA:  Chronic right knee pain.   EXAM: MRI OF THE RIGHT KNEE WITHOUT CONTRAST   TECHNIQUE: Multiplanar, multisequence MR imaging of the knee was performed. No intravenous contrast was administered.   COMPARISON:  Right knee x-rays dated January 04, 2022.   FINDINGS: MENISCI   Medial meniscus:  Intact.   Lateral meniscus: Degeneration and free edge fraying of the anterior and posterior roots without discrete tear. No extrusion.   LIGAMENTS   Cruciates:  Intact ACL  and PCL.   Collaterals: Medial collateral ligament is intact. Lateral collateral ligament complex is intact.   CARTILAGE   Patellofemoral: Mild diffuse cartilage thinning without focal defect.   Medial:  Mild diffuse cartilage thinning without focal defect.   Lateral: Mild partial-thickness cartilage loss over the weight-bearing lateral femoral condyle and lateral tibial plateau. Near full-thickness cartilage loss over the posterior nonweightbearing lateral femoral condyle.   Joint: Small joint effusion. Mild edema within Hoffa's fat anterior to the lateral meniscus anterior horn.   Popliteal Fossa:  No Baker cyst. Intact popliteus tendon.   Extensor Mechanism: Intact quadriceps tendon and patellar tendon. Intact medial and lateral patellar retinaculum. Intact MPFL.   Bones: No acute fracture or dislocation. No suspicious bone lesion. Tiny tricompartmental marginal osteophytes.   Other: None.   IMPRESSION: 1. 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 2. Mild tricompartmental osteoarthritis, greatest in the lateral compartment. 3. Small joint effusion.   PATIENT SURVEYS:  FOTO 63  COGNITION: Overall cognitive status: Within functional limits for tasks assessed     SENSATION: WFL  EDEMA:  Yes right knee   PALPATION: Tender right knee lateral joint line and posterior capsule  LOWER EXTREMITY ROM:  Active ROM Right eval Left eval  Hip flexion    Hip extension    Hip abduction    Hip adduction    Hip internal rotation    Hip external rotation    Knee flexion 109* 124  Knee extension -8* 0  Ankle dorsiflexion    Ankle plantarflexion    Ankle inversion    Ankle eversion     (Blank rows = not tested)  LOWER EXTREMITY MMT:  MMT Right eval Left eval Right 03/31/23 Left 03/31/23  Hip flexion 4+ 5    Hip extension   3+ 3+  Hip abduction   3- 3+  Hip adduction      Hip internal  rotation      Hip external rotation      Knee flexion(sitting isometric) 4* 5    Knee extension 4-* 5    Ankle dorsiflexion 4+ 5    Ankle plantarflexion      Ankle inversion      Ankle eversion       (Blank rows = not tested)   FUNCTIONAL TESTS:  5 times sit to stand: 29.08 sec with weight shifted to left knee 2 minute walk test: 225 ft no AD  GAIT: Distance walked: 225 ft Assistive device utilized: Single point cane Level of assistance: Modified independence Comments: slower than normal gait speed; right knee valgus   TODAY'S TREATMENT:  DATE:  03/31/23 Bike seat 12 rocking 5 minutes Standing:  R knee flexion stretch 10x10"  Heelraises 15X  Hip abduction 10X each  Hip extension 10X each MMT of hip abduction/extension Supine:  bridge 10X  Hip abduction with GTB 10X  SLR 10X AROM Rt knee 100 degrees without pain, any further increase pain.  03/25/2023 physical therapy evaluation and HEP instruction    PATIENT EDUCATION:  Education details: Patient educated on exam findings, POC, scope of PT, HEP, and what to expect next visit. Person educated: Patient Education method: Explanation, Demonstration, and Handouts Education comprehension: verbalized understanding, returned demonstration, verbal cues required, and tactile cues required  HOME EXERCISE PROGRAM: Access Code: [email or web address] URL: [email or web address] Date: 03/25/2023 Prepared by: AP - Rehab  Exercises - Supine Quad Set  - 2 x daily - 7 x weekly - 1 sets - 10 reps - 5 sec hold - Long Sitting Calf Stretch with Strap  - 2 x daily - 7 x weekly - 1 sets - 5 reps - 20 sec hold - Supine Knee Extension Stretch on Towel Roll  - 2 x daily - 7 x weekly - 1 sets - 1 reps - 1 min hold  ASSESSMENT:  CLINICAL IMPRESSION: Reviewed goals and POC moving forward.  Began with bike with noted  discomfort in end range.  Educated to go to slight discomfort but not into pain.  Noted weakness in hip abductors and extensors with difficulty maintaining upright stability when completing therex.  Manual muscle test completed for hip mm in sidelying and prone.  Added therex to help target these mm as well.  No new exercises added to HEP this session.  AROM only to 110 in supine today before increased pain. Patient will continue to benefit from skilled physical therapy services to address deficits, reduce pain and improve level of function with ADLs and functional mobility tasks.   OBJECTIVE IMPAIRMENTS: Abnormal gait, decreased activity tolerance, decreased mobility, difficulty walking, decreased ROM, decreased strength, hypomobility, increased edema, increased fascial restrictions, impaired perceived functional ability, impaired flexibility, and pain.   ACTIVITY LIMITATIONS: carrying, lifting, bending, sitting, standing, squatting, sleeping, stairs, transfers, locomotion level, and caring for others  PARTICIPATION LIMITATIONS: meal prep, cleaning, laundry, shopping, community activity, and yard work  [facility] POTENTIAL: Good  CLINICAL DECISION MAKING: Stable/uncomplicated  EVALUATION COMPLEXITY: Low   GOALS: Goals reviewed with patient? Yes  SHORT TERM GOALS: Target date: 04/08/2023 patient will be independent with initial HEP  Baseline: Goal status: IN PROGRESS  2.  Patient will self report 30% improvement to improve tolerance for functional activity  Baseline:  Goal status: IN PROGRESS  LONG TERM GOALS: Target date: 05/01/2023  Patient will be independent in self management strategies to improve quality of life and functional outcomes.  Baseline:  Goal status: IN PROGRESS  2.  Patient will self report 50% improvement to improve tolerance for functional activity  Baseline:  Goal status: IN PROGRESS  3.  Patient will increased right knee mobility to -2 to 120 to promote  normal navigation of steps; step over step pattern   Baseline:  Goal status: IN PROGRESS  4.  Patient will increase right leg MMTs to 5/5 without pain to promote return to ambulation community distances with minimal deviation.  Baseline:  Goal status: IN PROGRESS  5.  Patient will improve FOTO score to 70   Baseline: 63 Goal status: IN PROGRESS  6.  Patient will increase distance on to 275 ft  to demonstrate improved functional mobility walking  household and community distances.   Baseline: 225 ft Goal status: IN PROGRESS   PLAN:  PT FREQUENCY: 2x/week  PT DURATION: 4 weeks  PLANNED INTERVENTIONS: Therapeutic exercises, Therapeutic activity, Neuromuscular re-education, Balance training, Gait training, Patient/Family education, Joint manipulation, Joint mobilization, Stair training, Orthotic/Fit training, DME instructions, Aquatic Therapy, Dry Needling, Electrical stimulation, Spinal manipulation, Spinal mobilization, Cryotherapy, Moist heat, Compression bandaging, scar mobilization, Splintting, Taping, Traction, Ultrasound, Ionotophoresis 4mg /ml Dexamethasone, and Manual therapy   PLAN FOR NEXT SESSION: continue to progress right knee mobility and strength as able. Update HEP next session to include focus on weak LE/hip mm and ROM.  10:57 AM, 03/31/23 Lurena Nida, PTA/CLT [facility] Ph: [phone]

## 2023-04-07 ENCOUNTER — Ambulatory Visit (HOSPITAL_COMMUNITY): Payer: Medicare PPO | Admitting: Physical Therapy

## 2023-04-07 DIAGNOSIS — R262 Difficulty in walking, not elsewhere classified: Secondary | ICD-10-CM

## 2023-04-07 DIAGNOSIS — M25561 Pain in right knee: Secondary | ICD-10-CM | POA: Diagnosis not present

## 2023-04-07 DIAGNOSIS — G8929 Other chronic pain: Secondary | ICD-10-CM

## 2023-04-07 NOTE — Therapy (Addendum)
OUTPATIENT PHYSICAL THERAPY LOWER EXTREMITY TREATMENT   Patient Name: Wendy Newman MRN: [id number] DOB:Sep 29, 1941, 82 y.o., female Today's Date: 04/07/2023  END OF SESSION:  PT End of Session - 04/07/23 0814     Visit Number 3    Number of Visits 8    Date for PT Re-Evaluation 05/01/23    Authorization Type [employer] Medicare for evaluation then changed to [employer] medicare effective 6/1    Authorization Time Period request put in for 6/4-7/5 for 8 visits    Authorization - Visit Number 2    Authorization - Number of Visits 8    Progress Note Due on Visit 10    PT Start Time 0815    PT Stop Time 0855    PT Time Calculation (min) 40 min    Activity Tolerance Patient tolerated treatment well    Behavior During Therapy WFL for tasks assessed/performed             Past Medical History:  Diagnosis Date   Anxiety    Cataract    DVT (deep venous thrombosis) (HCC)    Edema of both legs    Essential hypertension    Osteoporosis    Vitamin D deficiency    Past Surgical History:  Procedure Laterality Date   ABDOMINAL HYSTERECTOMY     Complete   CESAREAN SECTION     X 3   COLONOSCOPY  10/10/2008   WUJ:[id number]   COLONOSCOPY WITH PROPOFOL N/A 12/21/2014   Dr. Rourk:noncompliant left colon and redundant right colon, rectal and colonic mucosa appeared normal.    Patient Active Problem List   Diagnosis Date Noted   Acute deep vein thrombosis (DVT) of popliteal vein of left lower extremity (HCC) 10/02/2021   Allergic rhinoconjunctivitis 07/02/2017   Family history of colonic polyps 11/28/2014   Family history of colon cancer 11/28/2014   Constipation 11/28/2014   Vitamin D deficiency 06/16/2013   Hypertension    Anxiety    Osteoporosis    Cataract    Hyperlipemia 05/18/2009   Anxiety state 05/18/2009   NECK PAIN 05/18/2009   ALLERGIC RHINITIS 08/28/2008   OSTEOARTHRITIS 08/28/2008   POSTMENOPAUSAL OSTEOPOROSIS 08/28/2008   PAT 04/24/2008   OTHER ABNORMAL BLOOD  CHEMISTRY 04/24/2008   Essential hypertension 12/16/2007   HEADACHE 12/16/2007    PCP: Lynnea Ferrier, MD  REFERRING PROVIDER: Oliver Barre, MD  REFERRING DIAG: [phone] (ICD-10-CM) - Chronic pain of right knee  THERAPY DIAG:  Chronic pain of right knee  Difficulty in walking, not elsewhere classified  Rationale for Evaluation and Treatment: Rehabilitation  ONSET DATE: 2 years  SUBJECTIVE:   SUBJECTIVE STATEMENT: Pt states her pain is a 6/10 today.   Comes today using SPC (had nothing last visit)  Evaluation: Patient reports knee pain for about 2 years; she states she started seeing Dr. Dallas Schimke about 2 years ago and started having injections; has had 2 injections which help some; also gave her a brace and referred her to therapy.   PERTINENT HISTORY: Osteoporisis Recent widower PAIN:  Are you having pain? Yes: NPRS scale: 6/10 Pain location: right knee Pain description: burning Aggravating factors: sitting for a long time Relieving factors: movement, tylenol  PRECAUTIONS: Fall  WEIGHT BEARING RESTRICTIONS: No  FALLS:  Has patient fallen in last 6 months? Yes. Number of falls 1   OCCUPATION: retired   PLOF: Independent  PATIENT GOALS: help my knee get better  NEXT MD VISIT: prn  OBJECTIVE:   DIAGNOSTIC FINDINGS:   CLINICAL  DATALarey Seat on Saturday, RIGHT knee trauma, acute RIGHT knee pain   EXAM: RIGHT KNEE - 3 VIEW   COMPARISON:  01/04/2022   FINDINGS: Osseous demineralization.   Slight narrowing of joint spaces.   No acute fracture, dislocation, or bone destruction.   No joint effusion.   IMPRESSION: Mild degenerative changes RIGHT knee without acute bony abnormalities.  CLINICAL DATA:  Chronic right knee pain.   EXAM: MRI OF THE RIGHT KNEE WITHOUT CONTRAST   TECHNIQUE: Multiplanar, multisequence MR imaging of the knee was performed. No intravenous contrast was administered.   COMPARISON:  Right knee x-rays dated January 04, 2022.   FINDINGS: MENISCI   Medial meniscus:  Intact.   Lateral meniscus: Degeneration and free edge fraying of the anterior and posterior roots without discrete tear. No extrusion.   LIGAMENTS   Cruciates:  Intact ACL and PCL.   Collaterals: Medial collateral ligament is intact. Lateral collateral ligament complex is intact.   CARTILAGE   Patellofemoral: Mild diffuse cartilage thinning without focal defect.   Medial:  Mild diffuse cartilage thinning without focal defect.   Lateral: Mild partial-thickness cartilage loss over the weight-bearing lateral femoral condyle and lateral tibial plateau. Near full-thickness cartilage loss over the posterior nonweightbearing lateral femoral condyle.   Joint: Small joint effusion. Mild edema within Hoffa's fat anterior to the lateral meniscus anterior horn.   Popliteal Fossa:  No Baker cyst. Intact popliteus tendon.   Extensor Mechanism: Intact quadriceps tendon and patellar tendon. Intact medial and lateral patellar retinaculum. Intact MPFL.   Bones: No acute fracture or dislocation. No suspicious bone lesion. Tiny tricompartmental marginal osteophytes.   Other: None.   IMPRESSION: 1. 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 2. Mild tricompartmental osteoarthritis, greatest in the lateral compartment. 3. Small joint effusion.   PATIENT SURVEYS:  FOTO 63  COGNITION: Overall cognitive status: Within functional limits for tasks assessed     SENSATION: WFL  EDEMA:  Yes right knee   PALPATION: Tender right knee lateral joint line and posterior capsule  LOWER EXTREMITY ROM:  Active ROM Right eval Left eval  Hip flexion    Hip extension    Hip abduction    Hip adduction    Hip internal rotation    Hip external rotation    Knee flexion 109* 124  Knee extension -8* 0  Ankle dorsiflexion    Ankle plantarflexion    Ankle inversion     Ankle eversion     (Blank rows = not tested)  LOWER EXTREMITY MMT:  MMT Right eval Left eval Right 03/31/23 Left 03/31/23  Hip flexion 4+ 5    Hip extension   3+ 3+  Hip abduction   3- 3+  Hip adduction      Hip internal rotation      Hip external rotation      Knee flexion(sitting isometric) 4* 5    Knee extension 4-* 5    Ankle dorsiflexion 4+ 5    Ankle plantarflexion      Ankle inversion      Ankle eversion       (Blank rows = not tested)   FUNCTIONAL TESTS:  5 times sit to stand: 29.08 sec with weight shifted to left knee 2 minute walk test: 225 ft no AD  GAIT: Distance walked: 225 ft Assistive device utilized: Single point cane Level of assistance: Modified independence Comments: slower than normal gait speed; right knee valgus  TODAY'S TREATMENT:                                                                                                                              DATE:  04/07/23 Bike seat 12 rocking 5 minutes Standing:  R knee flexion stretch 10x10"  Heelraises 15X  Rt knee flexion 15X  Hip abduction 15X each  Hip extension 15X each  Forward lunge no UE 4" 10X  Forward step up 10X each with bil UE assist Supine:  bridge 10X  SLR 10X  Heelslides 10X Rt AROM Rt knee 107 degrees without pain, any further increase pain.   03/31/23 Bike seat 12 rocking 5 minutes Standing:  R knee flexion stretch 10x10"  Heelraises 15X  Hip abduction 10X each  Hip extension 10X each MMT of hip abduction/extension Supine:  bridge 10X  Hip abduction with GTB 10X  SLR 10X AROM Rt knee 100 degrees without pain, any further increase pain.  03/25/2023 physical therapy evaluation and HEP instruction    PATIENT EDUCATION:  Education details: Patient educated on exam findings, POC, scope of PT, HEP, and what to expect next visit. Person educated: Patient Education method: Explanation, Demonstration, and Handouts Education comprehension: verbalized understanding,  returned demonstration, verbal cues required, and tactile cues required  HOME EXERCISE PROGRAM: Access Code: [email or web address] URL: [email or web address] Date: 03/25/2023 Prepared by: AP - Rehab  Exercises - Supine Quad Set  - 2 x daily - 7 x weekly - 1 sets - 10 reps - 5 sec hold - Long Sitting Calf Stretch with Strap  - 2 x daily - 7 x weekly - 1 sets - 5 reps - 20 sec hold - Supine Knee Extension Stretch on Towel Roll  - 2 x daily - 7 x weekly - 1 sets - 1 reps - 1 min hold  Date: 04/07/2023 Prepared by: Emeline Gins Exercises - Supine Bridge  - 2 x daily - 7 x weekly - 1 sets - 10 reps - Active Straight Leg Raise with Quad Set  - 2 x daily - 7 x weekly - 1 sets - 10 reps - Standing Hip Abduction with Counter Support  - 2 x daily - 7 x weekly - 1 sets - 10 reps - Standing Hip Extension with Counter Support  - 2 x daily - 7 x weekly - 1 sets - 10 reps - Sit to Stand  - 2 x daily - 7 x weekly - 2 sets - 5 reps  ASSESSMENT:  CLINICAL IMPRESSION: Able to make full revolutions on bike from start and without the pain she voiced last visit . Less pain in knee also using SPC. Progressed with lunges, forward step ups and sit to stands.   Updated HEP to include more challenging exercises and progression to standing.  Pt able to achieve greater AROM in Rt knee today to 107 degrees as only able to achieve 100 last session before having increased pain. Patient will continue  to benefit from skilled physical therapy services to address deficits, reduce pain and improve level of function with ADLs and functional mobility tasks.   OBJECTIVE IMPAIRMENTS: Abnormal gait, decreased activity tolerance, decreased mobility, difficulty walking, decreased ROM, decreased strength, hypomobility, increased edema, increased fascial restrictions, impaired perceived functional ability, impaired flexibility, and pain.   ACTIVITY LIMITATIONS: carrying, lifting, bending, sitting, standing, squatting, sleeping,  stairs, transfers, locomotion level, and caring for others  PARTICIPATION LIMITATIONS: meal prep, cleaning, laundry, shopping, community activity, and yard work  [facility] POTENTIAL: Good  CLINICAL DECISION MAKING: Stable/uncomplicated  EVALUATION COMPLEXITY: Low   GOALS: Goals reviewed with patient? Yes  SHORT TERM GOALS: Target date: 04/08/2023 patient will be independent with initial HEP  Baseline: Goal status: IN PROGRESS  2.  Patient will self report 30% improvement to improve tolerance for functional activity  Baseline:  Goal status: IN PROGRESS  LONG TERM GOALS: Target date: 05/01/2023  Patient will be independent in self management strategies to improve quality of life and functional outcomes.  Baseline:  Goal status: IN PROGRESS  2.  Patient will self report 50% improvement to improve tolerance for functional activity  Baseline:  Goal status: IN PROGRESS  3.  Patient will increased right knee mobility to -2 to 120 to promote normal navigation of steps; step over step pattern   Baseline:  Goal status: IN PROGRESS  4.  Patient will increase right leg MMTs to 5/5 without pain to promote return to ambulation community distances with minimal deviation.  Baseline:  Goal status: IN PROGRESS  5.  Patient will improve FOTO score to 70   Baseline: 63 Goal status: IN PROGRESS  6.  Patient will increase distance on to 275 ft  to demonstrate improved functional mobility walking household and community distances.   Baseline: 225 ft Goal status: IN PROGRESS   PLAN:  PT FREQUENCY: 2x/week  PT DURATION: 4 weeks  PLANNED INTERVENTIONS: Therapeutic exercises, Therapeutic activity, Neuromuscular re-education, Balance training, Gait training, Patient/Family education, Joint manipulation, Joint mobilization, Stair training, Orthotic/Fit training, DME instructions, Aquatic Therapy, Dry Needling, Electrical stimulation, Spinal manipulation, Spinal mobilization,  Cryotherapy, Moist heat, Compression bandaging, scar mobilization, Splintting, Taping, Traction, Ultrasound, Ionotophoresis 4mg /ml Dexamethasone, and Manual therapy   PLAN FOR NEXT SESSION: continue to progress right knee mobility and strength as able.    1:31 PM, 04/07/23 Lurena Nida, PTA/CLT [facility] Ph: [phone]

## 2023-04-15 ENCOUNTER — Ambulatory Visit (HOSPITAL_COMMUNITY): Payer: Medicare PPO

## 2023-04-15 DIAGNOSIS — G8929 Other chronic pain: Secondary | ICD-10-CM | POA: Diagnosis not present

## 2023-04-15 DIAGNOSIS — R262 Difficulty in walking, not elsewhere classified: Secondary | ICD-10-CM | POA: Diagnosis not present

## 2023-04-15 DIAGNOSIS — M25561 Pain in right knee: Secondary | ICD-10-CM | POA: Diagnosis not present

## 2023-04-15 NOTE — Therapy (Signed)
OUTPATIENT PHYSICAL THERAPY LOWER EXTREMITY TREATMENT   Patient Name: Wendy Newman MRN: [id number] DOB:04-Dec-1940, 82 y.o., female Today's Date: 04/15/2023  END OF SESSION:  PT End of Session - 04/15/23 0750     Visit Number 4    Number of Visits 8    Date for PT Re-Evaluation 05/01/23    Authorization Type [employer] Medicare for evaluation then changed to [employer] medicare effective 6/1    Authorization Time Period request put in for 6/4-7/5 for 8 visits    Authorization - Visit Number 3    Authorization - Number of Visits 8    Progress Note Due on Visit 10    PT Start Time 0750    PT Stop Time 0830    PT Time Calculation (min) 40 min    Activity Tolerance Patient tolerated treatment well    Behavior During Therapy WFL for tasks assessed/performed             Past Medical History:  Diagnosis Date   Anxiety    Cataract    DVT (deep venous thrombosis) (HCC)    Edema of both legs    Essential hypertension    Osteoporosis    Vitamin D deficiency    Past Surgical History:  Procedure Laterality Date   ABDOMINAL HYSTERECTOMY     Complete   CESAREAN SECTION     X 3   COLONOSCOPY  10/10/2008   WUJ:[id number]   COLONOSCOPY WITH PROPOFOL N/A 12/21/2014   Dr. Rourk:noncompliant left colon and redundant right colon, rectal and colonic mucosa appeared normal.    Patient Active Problem List   Diagnosis Date Noted   Acute deep vein thrombosis (DVT) of popliteal vein of left lower extremity (HCC) 10/02/2021   Allergic rhinoconjunctivitis 07/02/2017   Family history of colonic polyps 11/28/2014   Family history of colon cancer 11/28/2014   Constipation 11/28/2014   Vitamin D deficiency 06/16/2013   Hypertension    Anxiety    Osteoporosis    Cataract    Hyperlipemia 05/18/2009   Anxiety state 05/18/2009   NECK PAIN 05/18/2009   ALLERGIC RHINITIS 08/28/2008   OSTEOARTHRITIS 08/28/2008   POSTMENOPAUSAL OSTEOPOROSIS 08/28/2008   PAT 04/24/2008   OTHER ABNORMAL BLOOD  CHEMISTRY 04/24/2008   Essential hypertension 12/16/2007   HEADACHE 12/16/2007    PCP: Lynnea Ferrier, MD  REFERRING PROVIDER: Oliver Barre, MD  REFERRING DIAG: [phone] (ICD-10-CM) - Chronic pain of right knee  THERAPY DIAG:  Chronic pain of right knee  Difficulty in walking, not elsewhere classified  Rationale for Evaluation and Treatment: Rehabilitation  ONSET DATE: 2 years  SUBJECTIVE:   SUBJECTIVE STATEMENT: 8/10 pain today Right knee; slightly increased; using a SPC.  Brace doesn't really help  Evaluation: Patient reports knee pain for about 2 years; she states she started seeing Dr. Dallas Schimke about 2 years ago and started having injections; has had 2 injections which help some; also gave her a brace and referred her to therapy.   PERTINENT HISTORY: Osteoporisis Recent widower PAIN:  Are you having pain? Yes: NPRS scale: 6/10 Pain location: right knee Pain description: burning Aggravating factors: sitting for a long time Relieving factors: movement, tylenol  PRECAUTIONS: Fall  WEIGHT BEARING RESTRICTIONS: No  FALLS:  Has patient fallen in last 6 months? Yes. Number of falls 1   OCCUPATION: retired   PLOF: Independent  PATIENT GOALS: help my knee get better  NEXT MD VISIT: prn  OBJECTIVE:   DIAGNOSTIC FINDINGS:   CLINICAL DATA:  Larey Seat  on Saturday, RIGHT knee trauma, acute RIGHT knee pain   EXAM: RIGHT KNEE - 3 VIEW   COMPARISON:  01/04/2022   FINDINGS: Osseous demineralization.   Slight narrowing of joint spaces.   No acute fracture, dislocation, or bone destruction.   No joint effusion.   IMPRESSION: Mild degenerative changes RIGHT knee without acute bony abnormalities.  CLINICAL DATA:  Chronic right knee pain.   EXAM: MRI OF THE RIGHT KNEE WITHOUT CONTRAST   TECHNIQUE: Multiplanar, multisequence MR imaging of the knee was performed. No intravenous contrast was administered.   COMPARISON:  Right knee x-rays dated January 04, 2022.   FINDINGS: MENISCI   Medial meniscus:  Intact.   Lateral meniscus: Degeneration and free edge fraying of the anterior and posterior roots without discrete tear. No extrusion.   LIGAMENTS   Cruciates:  Intact ACL and PCL.   Collaterals: Medial collateral ligament is intact. Lateral collateral ligament complex is intact.   CARTILAGE   Patellofemoral: Mild diffuse cartilage thinning without focal defect.   Medial:  Mild diffuse cartilage thinning without focal defect.   Lateral: Mild partial-thickness cartilage loss over the weight-bearing lateral femoral condyle and lateral tibial plateau. Near full-thickness cartilage loss over the posterior nonweightbearing lateral femoral condyle.   Joint: Small joint effusion. Mild edema within Hoffa's fat anterior to the lateral meniscus anterior horn.   Popliteal Fossa:  No Baker cyst. Intact popliteus tendon.   Extensor Mechanism: Intact quadriceps tendon and patellar tendon. Intact medial and lateral patellar retinaculum. Intact MPFL.   Bones: No acute fracture or dislocation. No suspicious bone lesion. Tiny tricompartmental marginal osteophytes.   Other: None.   IMPRESSION: 1. 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 2. Mild tricompartmental osteoarthritis, greatest in the lateral compartment. 3. Small joint effusion.   PATIENT SURVEYS:  FOTO 63  COGNITION: Overall cognitive status: Within functional limits for tasks assessed     SENSATION: WFL  EDEMA:  Yes right knee   PALPATION: Tender right knee lateral joint line and posterior capsule  LOWER EXTREMITY ROM:  Active ROM Right eval Left eval  Hip flexion    Hip extension    Hip abduction    Hip adduction    Hip internal rotation    Hip external rotation    Knee flexion 109* 124  Knee extension -8* 0  Ankle dorsiflexion    Ankle plantarflexion    Ankle inversion     Ankle eversion     (Blank rows = not tested)  LOWER EXTREMITY MMT:  MMT Right eval Left eval Right 03/31/23 Left 03/31/23  Hip flexion 4+ 5    Hip extension   3+ 3+  Hip abduction   3- 3+  Hip adduction      Hip internal rotation      Hip external rotation      Knee flexion(sitting isometric) 4* 5    Knee extension 4-* 5    Ankle dorsiflexion 4+ 5    Ankle plantarflexion      Ankle inversion      Ankle eversion       (Blank rows = not tested)   FUNCTIONAL TESTS:  5 times sit to stand: 29.08 sec with weight shifted to left knee 2 minute walk test: 225 ft no AD  GAIT: Distance walked: 225 ft Assistive device utilized: Single point cane Level of assistance: Modified independence Comments: slower than normal gait speed; right knee valgus   TODAY'S  TREATMENT:                                                                                                                              DATE:  04/15/23 Bike seat 12 x 5 minutes full revolutions; dynamic warm up Standing: Heel raises 2 x 10 Toe raises 2 x 10 Knee drives for flexion x 2' on 12" step Hip abduction 2 x 10 Hip extension 2 x 10 4" step ups x 10 each 4" lateral step ups x 10 Tandem stance 2 x 30" each way    04/07/23 Bike seat 12 rocking 5 minutes Standing:  R knee flexion stretch 10x10"  Heelraises 15X  Rt knee flexion 15X  Hip abduction 15X each  Hip extension 15X each  Forward lunge no UE 4" 10X  Forward step up 10X each with bil UE assist Supine:  bridge 10X  SLR 10X  Heelslides 10X Rt AROM Rt knee 107 degrees without pain, any further increase pain.   03/31/23 Bike seat 12 rocking 5 minutes Standing:  R knee flexion stretch 10x10"  Heelraises 15X  Hip abduction 10X each  Hip extension 10X each MMT of hip abduction/extension Supine:  bridge 10X  Hip abduction with GTB 10X  SLR 10X AROM Rt knee 100 degrees without pain, any further increase pain.  03/25/2023 physical therapy evaluation and  HEP instruction    PATIENT EDUCATION:  Education details: Patient educated on exam findings, POC, scope of PT, HEP, and what to expect next visit. Person educated: Patient Education method: Explanation, Demonstration, and Handouts Education comprehension: verbalized understanding, returned demonstration, verbal cues required, and tactile cues required  HOME EXERCISE PROGRAM: 04/15/23 tandem stance  Access Code: [email or web address] URL: [email or web address] Date: 03/25/2023 Prepared by: AP - Rehab  Exercises - Supine Quad Set  - 2 x daily - 7 x weekly - 1 sets - 10 reps - 5 sec hold - Long Sitting Calf Stretch with Strap  - 2 x daily - 7 x weekly - 1 sets - 5 reps - 20 sec hold - Supine Knee Extension Stretch on Towel Roll  - 2 x daily - 7 x weekly - 1 sets - 1 reps - 1 min hold  Date: 04/07/2023 Prepared by: Emeline Gins Exercises - Supine Bridge  - 2 x daily - 7 x weekly - 1 sets - 10 reps - Active Straight Leg Raise with Quad Set  - 2 x daily - 7 x weekly - 1 sets - 10 reps - Standing Hip Abduction with Counter Support  - 2 x daily - 7 x weekly - 1 sets - 10 reps - Standing Hip Extension with Counter Support  - 2 x daily - 7 x weekly - 1 sets - 10 reps - Sit to Stand  - 2 x daily - 7 x weekly - 2 sets - 5 reps  ASSESSMENT:  CLINICAL IMPRESSION: Increased pain levels at the start of treatment today but  tolerated increased reps of exercise well without complaint of increased pain.  Added lateral step ups today to address strength with lateral movement; needs bilateral UE assist for lateral step ups and reports pain is primarily lateral right knee with step ups.Patient continues with significant valgus right knee > left.  Introduced tandem stance with max challenge to balance today. Updated HEP.  Patient will continue to benefit from skilled physical therapy services to address deficits, reduce pain and improve level of function with ADLs and functional mobility  tasks.   OBJECTIVE IMPAIRMENTS: Abnormal gait, decreased activity tolerance, decreased mobility, difficulty walking, decreased ROM, decreased strength, hypomobility, increased edema, increased fascial restrictions, impaired perceived functional ability, impaired flexibility, and pain.   ACTIVITY LIMITATIONS: carrying, lifting, bending, sitting, standing, squatting, sleeping, stairs, transfers, locomotion level, and caring for others  PARTICIPATION LIMITATIONS: meal prep, cleaning, laundry, shopping, community activity, and yard work  [facility] POTENTIAL: Good  CLINICAL DECISION MAKING: Stable/uncomplicated  EVALUATION COMPLEXITY: Low   GOALS: Goals reviewed with patient? Yes  SHORT TERM GOALS: Target date: 04/08/2023 patient will be independent with initial HEP  Baseline: Goal status: IN PROGRESS  2.  Patient will self report 30% improvement to improve tolerance for functional activity  Baseline:  Goal status: IN PROGRESS  LONG TERM GOALS: Target date: 05/01/2023  Patient will be independent in self management strategies to improve quality of life and functional outcomes.  Baseline:  Goal status: IN PROGRESS  2.  Patient will self report 50% improvement to improve tolerance for functional activity  Baseline:  Goal status: IN PROGRESS  3.  Patient will increased right knee mobility to -2 to 120 to promote normal navigation of steps; step over step pattern   Baseline:  Goal status: IN PROGRESS  4.  Patient will increase right leg MMTs to 5/5 without pain to promote return to ambulation community distances with minimal deviation.  Baseline:  Goal status: IN PROGRESS  5.  Patient will improve FOTO score to 70   Baseline: 63 Goal status: IN PROGRESS  6.  Patient will increase distance on to 275 ft  to demonstrate improved functional mobility walking household and community distances.   Baseline: 225 ft Goal status: IN PROGRESS   PLAN:  PT FREQUENCY:  2x/week  PT DURATION: 4 weeks  PLANNED INTERVENTIONS: Therapeutic exercises, Therapeutic activity, Neuromuscular re-education, Balance training, Gait training, Patient/Family education, Joint manipulation, Joint mobilization, Stair training, Orthotic/Fit training, DME instructions, Aquatic Therapy, Dry Needling, Electrical stimulation, Spinal manipulation, Spinal mobilization, Cryotherapy, Moist heat, Compression bandaging, scar mobilization, Splintting, Taping, Traction, Ultrasound, Ionotophoresis 4mg /ml Dexamethasone, and Manual therapy   PLAN FOR NEXT SESSION: continue to progress right knee mobility and strength as able. balance   8:34 AM, 04/15/23  Small  MPT [place] [facility] [place] [phone]

## 2023-04-27 ENCOUNTER — Ambulatory Visit (HOSPITAL_COMMUNITY): Payer: Medicare PPO | Attending: Orthopedic Surgery | Admitting: Physical Therapy

## 2023-04-27 ENCOUNTER — Other Ambulatory Visit: Payer: Self-pay | Admitting: Family Medicine

## 2023-04-27 DIAGNOSIS — M25561 Pain in right knee: Secondary | ICD-10-CM | POA: Diagnosis not present

## 2023-04-27 DIAGNOSIS — G8929 Other chronic pain: Secondary | ICD-10-CM | POA: Insufficient documentation

## 2023-04-27 DIAGNOSIS — R262 Difficulty in walking, not elsewhere classified: Secondary | ICD-10-CM | POA: Diagnosis not present

## 2023-04-27 DIAGNOSIS — E7800 Pure hypercholesterolemia, unspecified: Secondary | ICD-10-CM

## 2023-04-27 NOTE — Therapy (Signed)
OUTPATIENT PHYSICAL THERAPY LOWER EXTREMITY TREATMENT   Patient Name: Wendy Newman MRN: [id number] DOB:[birth year], 82 y.o., female Today's Date: 04/27/2023  END OF SESSION:  PT End of Session - 04/27/23 0916     Visit Number 5    Number of Visits 8    Date for PT Re-Evaluation 05/01/23    Authorization Type [employer] Medicare for evaluation then changed to [employer] medicare effective 6/1    Authorization Time Period request put in for 6/4-7/5 for 8 visits    Authorization - Number of Visits 8    Progress Note Due on Visit 10    PT Start Time 0904    PT Stop Time 0945    PT Time Calculation (min) 41 min    Activity Tolerance Patient tolerated treatment well    Behavior During Therapy [facility] for tasks assessed/performed             Past Medical History:  Diagnosis Date   Anxiety    Cataract    DVT (deep venous thrombosis) (HCC)    Edema of both legs    Essential hypertension    Osteoporosis    Vitamin D deficiency    Past Surgical History:  Procedure Laterality Date   ABDOMINAL HYSTERECTOMY     Complete   CESAREAN SECTION     X 3   COLONOSCOPY  10/10/2008   WUJ:[id number]   COLONOSCOPY WITH PROPOFOL N/A 12/21/2014   Dr. Rourk:noncompliant left colon and redundant right colon, rectal and colonic mucosa appeared normal.    Patient Active Problem List   Diagnosis Date Noted   Acute deep vein thrombosis (DVT) of popliteal vein of left lower extremity (HCC) 10/02/2021   Allergic rhinoconjunctivitis 07/02/2017   Family history of colonic polyps 11/28/2014   Family history of colon cancer 11/28/2014   Constipation 11/28/2014   Vitamin D deficiency 06/16/2013   Hypertension    Anxiety    Osteoporosis    Cataract    Hyperlipemia 05/18/2009   Anxiety state 05/18/2009   NECK PAIN 05/18/2009   ALLERGIC RHINITIS 08/28/2008   OSTEOARTHRITIS 08/28/2008   POSTMENOPAUSAL OSTEOPOROSIS 08/28/2008   PAT 04/24/2008   OTHER ABNORMAL BLOOD CHEMISTRY 04/24/2008   Essential  hypertension 12/16/2007   HEADACHE 12/16/2007    PCP: Lynnea Ferrier, MD  REFERRING PROVIDER: Oliver Barre, MD  REFERRING DIAG: [phone] (ICD-10-CM) - Chronic pain of right knee  THERAPY DIAG:  Chronic pain of right knee  Difficulty in walking, not elsewhere classified  Rationale for Evaluation and Treatment: Rehabilitation  ONSET DATE: 2 years  SUBJECTIVE:   SUBJECTIVE STATEMENT: Pt reports pain remains high at 8/10 Right knee.  States she cannot tell any improvement thus far.  Wondering what will be the next step when returns to MD.  Evaluation: Patient reports knee pain for about 2 years; she states she started seeing Dr. Dallas Schimke about 2 years ago and started having injections; has had 2 injections which help some; also gave her a brace and referred her to therapy.   PERTINENT HISTORY: Osteoporisis Recent widower PAIN:  Are you having pain? Yes: NPRS scale: 8/10 Pain location: right knee Pain description: burning Aggravating factors: sitting for a long time Relieving factors: movement, tylenol  PRECAUTIONS: Fall  WEIGHT BEARING RESTRICTIONS: No  FALLS:  Has patient fallen in last 6 months? Yes. Number of falls 1   OCCUPATION: retired   PLOF: Independent  PATIENT GOALS: help my knee get better  NEXT MD VISIT: prn  OBJECTIVE:   DIAGNOSTIC  FINDINGS:   CLINICAL DATA:  Larey Seat on Saturday, RIGHT knee trauma, acute RIGHT knee pain   EXAM: RIGHT KNEE - 3 VIEW   COMPARISON:  01/04/2022   FINDINGS: Osseous demineralization.   Slight narrowing of joint spaces.   No acute fracture, dislocation, or bone destruction.   No joint effusion.   IMPRESSION: Mild degenerative changes RIGHT knee without acute bony abnormalities.  CLINICAL DATA:  Chronic right knee pain.   EXAM: MRI OF THE RIGHT KNEE WITHOUT CONTRAST   TECHNIQUE: Multiplanar, multisequence MR imaging of the knee was performed. No intravenous contrast was administered.    COMPARISON:  Right knee x-rays dated January 04, 2022.   FINDINGS: MENISCI   Medial meniscus:  Intact.   Lateral meniscus: Degeneration and free edge fraying of the anterior and posterior roots without discrete tear. No extrusion.   LIGAMENTS   Cruciates:  Intact ACL and PCL.   Collaterals: Medial collateral ligament is intact. Lateral collateral ligament complex is intact.   CARTILAGE   Patellofemoral: Mild diffuse cartilage thinning without focal defect.   Medial:  Mild diffuse cartilage thinning without focal defect.   Lateral: Mild partial-thickness cartilage loss over the weight-bearing lateral femoral condyle and lateral tibial plateau. Near full-thickness cartilage loss over the posterior nonweightbearing lateral femoral condyle.   Joint: Small joint effusion. Mild edema within Hoffa's fat anterior to the lateral meniscus anterior horn.   Popliteal Fossa:  No Baker cyst. Intact popliteus tendon.   Extensor Mechanism: Intact quadriceps tendon and patellar tendon. Intact medial and lateral patellar retinaculum. Intact MPFL.   Bones: No acute fracture or dislocation. No suspicious bone lesion. Tiny tricompartmental marginal osteophytes.   Other: None.   IMPRESSION: 1. 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 2. Mild tricompartmental osteoarthritis, greatest in the lateral compartment. 3. Small joint effusion.   PATIENT SURVEYS:  FOTO 63  COGNITION: Overall cognitive status: Within functional limits for tasks assessed     SENSATION: WFL  EDEMA:  Yes right knee   PALPATION: Tender right knee lateral joint line and posterior capsule  LOWER EXTREMITY ROM:  Active ROM Right eval Left eval  Hip flexion    Hip extension    Hip abduction    Hip adduction    Hip internal rotation    Hip external rotation    Knee flexion 109* 124  Knee extension -8* 0  Ankle dorsiflexion     Ankle plantarflexion    Ankle inversion    Ankle eversion     (Blank rows = not tested)  LOWER EXTREMITY MMT:  MMT Right eval Left eval Right 03/31/23 Left 03/31/23  Hip flexion 4+ 5    Hip extension   3+ 3+  Hip abduction   3- 3+  Hip adduction      Hip internal rotation      Hip external rotation      Knee flexion(sitting isometric) 4* 5    Knee extension 4-* 5    Ankle dorsiflexion 4+ 5    Ankle plantarflexion      Ankle inversion      Ankle eversion       (Blank rows = not tested)   FUNCTIONAL TESTS:  5 times sit to stand: 29.08 sec with weight shifted to left knee 2 minute walk test: 225 ft no AD  GAIT: Distance walked: 225 ft Assistive device utilized: Single point cane Level of assistance: Modified independence Comments: slower than normal gait  speed; right knee valgus   TODAY'S TREATMENT:                                                                                                                              DATE:  04/27/23 Bike seat 12 x 5 minutes full revolutions; dynamic warm up Standing: Knee drives for flexion x 2' on 12" step Hip abduction 2 x 10 Hip extension 2 x 10 4" step ups 2x10 Rt LE bil UE assist 4" lateral step ups 2x10 Rt with bil UE assist Tandem stance 2 x 30" each way intermittent HHA (5" max without UE) Vectors standing on Rt with 1 HHA 10X3" each Lunges onto 4" step no UE assist 10X each LE  04/15/23 Bike seat 12 x 5 minutes full revolutions; dynamic warm up Standing: Heel raises 2 x 10 Toe raises 2 x 10 Knee drives for flexion x 2' on 12" step Hip abduction 2 x 10 Hip extension 2 x 10 4" step ups x 10 each 4" lateral step ups x 10 Tandem stance 2 x 30" each way    04/07/23 Bike seat 12 rocking 5 minutes Standing:  R knee flexion stretch 10x10"  Heelraises 15X  Rt knee flexion 15X  Hip abduction 15X each  Hip extension 15X each  Forward lunge no UE 4" 10X  Forward step up 10X each with bil UE assist Supine:  bridge  10X  SLR 10X  Heelslides 10X Rt AROM Rt knee 107 degrees without pain, any further increase pain.   03/31/23 Bike seat 12 rocking 5 minutes Standing:  R knee flexion stretch 10x10"  Heelraises 15X  Hip abduction 10X each  Hip extension 10X each MMT of hip abduction/extension Supine:  bridge 10X  Hip abduction with GTB 10X  SLR 10X AROM Rt knee 100 degrees without pain, any further increase pain.  03/25/2023 physical therapy evaluation and HEP instruction    PATIENT EDUCATION:  Education details: Patient educated on exam findings, POC, scope of PT, HEP, and what to expect next visit. Person educated: Patient Education method: Explanation, Demonstration, and Handouts Education comprehension: verbalized understanding, returned demonstration, verbal cues required, and tactile cues required  HOME EXERCISE PROGRAM: 04/15/23 tandem stance  Access Code: [email or web address] URL: [email or web address] Date: 03/25/2023 Prepared by: AP - Rehab  Exercises - Supine Quad Set  - 2 x daily - 7 x weekly - 1 sets - 10 reps - 5 sec hold - Long Sitting Calf Stretch with Strap  - 2 x daily - 7 x weekly - 1 sets - 5 reps - 20 sec hold - Supine Knee Extension Stretch on Towel Roll  - 2 x daily - 7 x weekly - 1 sets - 1 reps - 1 min hold  Date: 04/07/2023 Prepared by: Emeline Gins Exercises - Supine Bridge  - 2 x daily - 7 x weekly - 1 sets - 10 reps - Active Straight Leg Raise with Quad Set  -  2 x daily - 7 x weekly - 1 sets - 10 reps - Standing Hip Abduction with Counter Support  - 2 x daily - 7 x weekly - 1 sets - 10 reps - Standing Hip Extension with Counter Support  - 2 x daily - 7 x weekly - 1 sets - 10 reps - Sit to Stand  - 2 x daily - 7 x weekly - 2 sets - 5 reps  ASSESSMENT:  CLINICAL IMPRESSION: Pt with unchanged pain levels thus far with noted frustration voiced.  Continued to work on LE strengthening/stability challenges.  Pt without c/o pain during session today but required  cues for general form with therex completion. Added forward lunges with ability to complete without UE, however noted weakness especially with Rt LE.  Vectors added to help improve stability.  Continued need for bilateral UE assist for lateral step ups as increased pain with 1 UE. Patient continues with significant valgus right knee > left.  Tandem stance max is 5 seconds before having to use UE's to steady self with either LE lead.  Patient will continue to benefit from skilled physical therapy services to address deficits, reduce pain and improve level of function with ADLs and functional mobility tasks.   OBJECTIVE IMPAIRMENTS: Abnormal gait, decreased activity tolerance, decreased mobility, difficulty walking, decreased ROM, decreased strength, hypomobility, increased edema, increased fascial restrictions, impaired perceived functional ability, impaired flexibility, and pain.   ACTIVITY LIMITATIONS: carrying, lifting, bending, sitting, standing, squatting, sleeping, stairs, transfers, locomotion level, and caring for others  PARTICIPATION LIMITATIONS: meal prep, cleaning, laundry, shopping, community activity, and yard work  [facility] POTENTIAL: Good  CLINICAL DECISION MAKING: Stable/uncomplicated  EVALUATION COMPLEXITY: Low   GOALS: Goals reviewed with patient? Yes  SHORT TERM GOALS: Target date: 04/08/2023 patient will be independent with initial HEP  Baseline: Goal status: IN PROGRESS  2.  Patient will self report 30% improvement to improve tolerance for functional activity  Baseline:  Goal status: IN PROGRESS  LONG TERM GOALS: Target date: 05/01/2023  Patient will be independent in self management strategies to improve quality of life and functional outcomes.  Baseline:  Goal status: IN PROGRESS  2.  Patient will self report 50% improvement to improve tolerance for functional activity  Baseline:  Goal status: IN PROGRESS  3.  Patient will increased right knee mobility to  -2 to 120 to promote normal navigation of steps; step over step pattern   Baseline:  Goal status: IN PROGRESS  4.  Patient will increase right leg MMTs to 5/5 without pain to promote return to ambulation community distances with minimal deviation.  Baseline:  Goal status: IN PROGRESS  5.  Patient will improve FOTO score to 70   Baseline: 63 Goal status: IN PROGRESS  6.  Patient will increase distance on to 275 ft  to demonstrate improved functional mobility walking household and community distances.   Baseline: 225 ft Goal status: IN PROGRESS   PLAN:  PT FREQUENCY: 2x/week  PT DURATION: 4 weeks  PLANNED INTERVENTIONS: Therapeutic exercises, Therapeutic activity, Neuromuscular re-education, Balance training, Gait training, Patient/Family education, Joint manipulation, Joint mobilization, Stair training, Orthotic/Fit training, DME instructions, Aquatic Therapy, Dry Needling, Electrical stimulation, Spinal manipulation, Spinal mobilization, Cryotherapy, Moist heat, Compression bandaging, scar mobilization, Splintting, Taping, Traction, Ultrasound, Ionotophoresis 4mg /ml Dexamethasone, and Manual therapy   PLAN FOR NEXT SESSION: continue to progress right knee mobility and strength as able. Try to reduce UE assist with step ups next visit.     9:32 AM,  04/27/23 Lurena Nida, PTA/CLT [facility] Ph: [phone]

## 2023-04-27 NOTE — Telephone Encounter (Signed)
Patient also needs the following refills:  meloxicam (MOBIC) 7.5 MG tablet [[id number]]   levocetirizine (XYZAL) 5 MG tablet [[id number]]   Please see previous message in thread for additional refills requested.

## 2023-04-27 NOTE — Telephone Encounter (Signed)
Prescription Request  04/27/2023  LOV: 01/22/2023  What is the name of the medication or equipment?   atorvastatin (LIPITOR) 20 MG tablet   hydrochlorothiazide (HYDRODIURIL) 25 MG tablet [[id number]]   citalopram (CELEXA) 20 MG tablet [[id number]]    Have you contacted your pharmacy to request a refill? Yes   Which pharmacy would you like this sent to?  [facility] - [place], [place] - [address] [place] [address] Phone: [phone] Fax: [phone]    Patient notified that their request is being sent to the clinical staff for review and that they should receive a response within 2 business days.   Please advise pharmacist.

## 2023-04-28 NOTE — Telephone Encounter (Signed)
Unable to refill per protocol, requests are too soon for refill.  Requested Prescriptions  Pending Prescriptions Disp Refills   levocetirizine (XYZAL) 5 MG tablet 100 tablet 0    Sig: Take 1 tablet (5 mg total) by mouth every evening.     Ear, Nose, and Throat:  Antihistamines - levocetirizine dihydrochloride Failed - 04/27/2023  1:49 PM      Failed - Valid encounter within last 12 months    Recent Outpatient Visits           1 year ago Benign essential HTN   [facility] Pickard, Priscille Heidelberg, MD   1 year ago Acute deep vein thrombosis (DVT) of popliteal vein of left lower extremity (HCC)   [facility] Valentino Nose, NP   1 year ago Pure hypercholesterolemia   [facility] Pickard, Priscille Heidelberg, MD   2 years ago Ptosis of right eyelid   [facility] Donita Brooks, MD   2 years ago Pure hypercholesterolemia   [facility] Pickard, Priscille Heidelberg, MD              Passed - Cr in normal range and within 360 days    Creat  Date Value Ref Range Status  01/22/2023 0.92 0.60 - 1.00 mg/dL Final         Passed - eGFR is 10 or above and within 360 days    GFR, Est African American  Date Value Ref Range Status  12/14/2020 68 > OR = 60 mL/min/1.50m2 Final   GFR, Est Non African American  Date Value Ref Range Status  12/14/2020 59 (L) > OR = 60 mL/min/1.65m2 Final   eGFR  Date Value Ref Range Status  01/22/2023 63 > OR = 60 mL/min/1.43m2 Final          meloxicam (MOBIC) 7.5 MG tablet 100 tablet 2    Sig: Take 1 tablet (7.5 mg total) by mouth daily.     Analgesics:  COX2 Inhibitors Failed - 04/27/2023  1:49 PM      Failed - Manual Review: Labs are only required if the patient has taken medication for more than 8 weeks.      Failed - Valid encounter within last 12 months    Recent Outpatient Visits           1 year ago Benign essential HTN   [facility] Pickard, Priscille Heidelberg, MD   1  year ago Acute deep vein thrombosis (DVT) of popliteal vein of left lower extremity (HCC)   [facility] Valentino Nose, NP   1 year ago Pure hypercholesterolemia   [facility] Tanya Nones, Priscille Heidelberg, MD   2 years ago Ptosis of right eyelid   [facility] Donita Brooks, MD   2 years ago Pure hypercholesterolemia   [facility] Pickard, Priscille Heidelberg, MD              Passed - HGB in normal range and within 360 days    Hemoglobin  Date Value Ref Range Status  01/22/2023 12.8 11.7 - 15.5 g/dL Final         Passed - Cr in normal range and within 360 days    Creat  Date Value Ref Range Status  01/22/2023 0.92 0.60 - 1.00 mg/dL Final         Passed - HCT in normal range and within 360  days    HCT  Date Value Ref Range Status  01/22/2023 38.5 35.0 - 45.0 % Final         Passed - AST in normal range and within 360 days    AST  Date Value Ref Range Status  01/22/2023 12 10 - 35 U/L Final         Passed - ALT in normal range and within 360 days    ALT  Date Value Ref Range Status  01/22/2023 9 6 - 29 U/L Final         Passed - eGFR is 30 or above and within 360 days    GFR, Est African American  Date Value Ref Range Status  12/14/2020 68 > OR = 60 mL/min/1.66m2 Final   GFR, Est Non African American  Date Value Ref Range Status  12/14/2020 59 (L) > OR = 60 mL/min/1.72m2 Final   eGFR  Date Value Ref Range Status  01/22/2023 63 > OR = 60 mL/min/1.81m2 Final         Passed - Patient is not pregnant       atorvastatin (LIPITOR) 20 MG tablet 100 tablet 2    Sig: Take 1 tablet (20 mg total) by mouth daily.     Cardiovascular:  Antilipid - Statins Failed - 04/27/2023  1:49 PM      Failed - Valid encounter within last 12 months    Recent Outpatient Visits           1 year ago Benign essential HTN   [facility] Pickard, Priscille Heidelberg, MD   1 year ago Acute deep vein thrombosis (DVT) of  popliteal vein of left lower extremity (HCC)   [facility] Valentino Nose, NP   1 year ago Pure hypercholesterolemia   [facility] Tanya Nones, Priscille Heidelberg, MD   2 years ago Ptosis of right eyelid   [facility] Pickard, Priscille Heidelberg, MD   2 years ago Pure hypercholesterolemia   [facility] Tanya Nones, Priscille Heidelberg, MD              Failed - Lipid Panel in normal range within the last 12 months    Cholesterol  Date Value Ref Range Status  01/22/2023 142 <200 mg/dL Final   LDL Cholesterol (Calc)  Date Value Ref Range Status  01/22/2023 79 mg/dL (calc) Final    Comment:    Reference range: <100 . Desirable range <100 mg/dL for primary prevention;   <70 mg/dL for patients with CHD or diabetic patients  with > or = 2 CHD risk factors. Marland Kitchen LDL-C is now calculated using the Martin-Hopkins  calculation, which is a validated novel method providing  better accuracy than the Friedewald equation in the  estimation of LDL-C.  Horald Pollen et al. Lenox Ahr. 1610;960(45): 2061-2068  (http://education.[employer].com/faq/FAQ164)    HDL  Date Value Ref Range Status  01/22/2023 46 (L) > OR = 50 mg/dL Final   Triglycerides  Date Value Ref Range Status  01/22/2023 85 <150 mg/dL Final         Passed - Patient is not pregnant       hydrochlorothiazide (HYDRODIURIL) 25 MG tablet 90 tablet 3    Sig: Take 1 tablet (25 mg total) by mouth daily.     Cardiovascular: Diuretics - Thiazide Failed - 04/27/2023  1:49 PM      Failed - Last BP in normal range    BP Readings from  Last 1 Encounters:  02/10/23 (!) 162/83         Failed - Valid encounter within last 6 months    Recent Outpatient Visits           1 year ago Benign essential HTN   [facility] Pickard, Priscille Heidelberg, MD   1 year ago Acute deep vein thrombosis (DVT) of popliteal vein of left lower extremity (HCC)   [facility] Valentino Nose, NP    1 year ago Pure hypercholesterolemia   [facility] Tanya Nones, Priscille Heidelberg, MD   2 years ago Ptosis of right eyelid   [facility] Donita Brooks, MD   2 years ago Pure hypercholesterolemia   [facility] Pickard, Priscille Heidelberg, MD              Passed - Cr in normal range and within 180 days    Creat  Date Value Ref Range Status  01/22/2023 0.92 0.60 - 1.00 mg/dL Final         Passed - K in normal range and within 180 days    Potassium  Date Value Ref Range Status  01/22/2023 4.2 3.5 - 5.3 mmol/L Final         Passed - Na in normal range and within 180 days    Sodium  Date Value Ref Range Status  01/22/2023 143 135 - 146 mmol/L Final          citalopram (CELEXA) 20 MG tablet 90 tablet 1    Sig: Take 1 tablet (20 mg total) by mouth daily.     Psychiatry:  Antidepressants - SSRI Failed - 04/27/2023  1:49 PM      Failed - Valid encounter within last 6 months    Recent Outpatient Visits           1 year ago Benign essential HTN   [facility] Donita Brooks, MD   1 year ago Acute deep vein thrombosis (DVT) of popliteal vein of left lower extremity (HCC)   [facility] Valentino Nose, NP   1 year ago Pure hypercholesterolemia   [facility] Tanya Nones, Priscille Heidelberg, MD   2 years ago Ptosis of right eyelid   [facility] Pickard, Priscille Heidelberg, MD   2 years ago Pure hypercholesterolemia   [facility] Pickard, Priscille Heidelberg, MD

## 2023-05-01 ENCOUNTER — Ambulatory Visit (HOSPITAL_COMMUNITY): Payer: Medicare PPO

## 2023-05-01 DIAGNOSIS — G8929 Other chronic pain: Secondary | ICD-10-CM

## 2023-05-01 DIAGNOSIS — M25561 Pain in right knee: Secondary | ICD-10-CM | POA: Diagnosis not present

## 2023-05-01 DIAGNOSIS — R262 Difficulty in walking, not elsewhere classified: Secondary | ICD-10-CM

## 2023-05-01 NOTE — Therapy (Signed)
OUTPATIENT PHYSICAL THERAPY LOWER EXTREMITY TREATMENT/PROGRESS NOTE Progress Note Reporting Period 03/25/23 to 05/01/2023  See note below for Objective Data and Assessment of Progress/Goals.  PHYSICAL THERAPY DISCHARGE SUMMARY  Visits from Start of Care: 6  Current functional level related to goals / functional outcomes: See below   Remaining deficits: See below   Education / Equipment: See below   Patient agrees to discharge. Patient goals were not met. Patient is being discharged due to lack of progress.       Patient Name: Wendy Newman MRN: [id number] DOB:1941/01/04, 82 y.o., female Today's Date: 05/01/2023  END OF SESSION:  PT End of Session - 05/01/23 0948     Visit Number 6    Number of Visits 8    Date for PT Re-Evaluation 05/01/23    Authorization Type [employer] Medicare for evaluation then changed to [employer] medicare effective 6/1    Authorization Time Period request put in for 6/4-7/5 for 8 visits    Authorization - Visit Number 4    Authorization - Number of Visits 8    Progress Note Due on Visit 10    PT Start Time 0946    PT Stop Time 1026    PT Time Calculation (min) 40 min    Activity Tolerance Patient tolerated treatment well    Behavior During Therapy WFL for tasks assessed/performed             Past Medical History:  Diagnosis Date   Anxiety    Cataract    DVT (deep venous thrombosis) (HCC)    Edema of both legs    Essential hypertension    Osteoporosis    Vitamin D deficiency    Past Surgical History:  Procedure Laterality Date   ABDOMINAL HYSTERECTOMY     Complete   CESAREAN SECTION     X 3   COLONOSCOPY  10/10/2008   WUJ:[id number]   COLONOSCOPY WITH PROPOFOL N/A 12/21/2014   Dr. Rourk:noncompliant left colon and redundant right colon, rectal and colonic mucosa appeared normal.    Patient Active Problem List   Diagnosis Date Noted   Acute deep vein thrombosis (DVT) of popliteal vein of left lower extremity (HCC) 10/02/2021    Allergic rhinoconjunctivitis 07/02/2017   Family history of colonic polyps 11/28/2014   Family history of colon cancer 11/28/2014   Constipation 11/28/2014   Vitamin D deficiency 06/16/2013   Hypertension    Anxiety    Osteoporosis    Cataract    Hyperlipemia 05/18/2009   Anxiety state 05/18/2009   NECK PAIN 05/18/2009   ALLERGIC RHINITIS 08/28/2008   OSTEOARTHRITIS 08/28/2008   POSTMENOPAUSAL OSTEOPOROSIS 08/28/2008   PAT 04/24/2008   OTHER ABNORMAL BLOOD CHEMISTRY 04/24/2008   Essential hypertension 12/16/2007   HEADACHE 12/16/2007    PCP: Lynnea Ferrier, MD  REFERRING PROVIDER: Oliver Barre, MD  REFERRING DIAG: [phone] (ICD-10-CM) - Chronic pain of right knee  THERAPY DIAG:  Chronic pain of right knee  Difficulty in walking, not elsewhere classified  Rationale for Evaluation and Treatment: Rehabilitation  ONSET DATE: 2 years  SUBJECTIVE:   SUBJECTIVE STATEMENT: Feels knee is no better; 8/10 pain today; knee seems to be getting worse.  Using [facility] today on arrival   Evaluation: Patient reports knee pain for about 2 years; she states she started seeing Dr. Dallas Schimke about 2 years ago and started having injections; has had 2 injections which help some; also gave her a brace and referred her to therapy.   PERTINENT HISTORY: Osteoporisis  Recent widower PAIN:  Are you having pain? Yes: NPRS scale: 8/10 Pain location: right knee Pain description: burning Aggravating factors: sitting for a long time Relieving factors: movement, tylenol  PRECAUTIONS: Fall  WEIGHT BEARING RESTRICTIONS: No  FALLS:  Has patient fallen in last 6 months? Yes. Number of falls 1   OCCUPATION: retired   PLOF: Independent  PATIENT GOALS: help my knee get better  NEXT MD VISIT: prn  OBJECTIVE:   DIAGNOSTIC FINDINGS:   CLINICAL DATA:  Fell on Saturday, RIGHT knee trauma, acute RIGHT knee pain   EXAM: RIGHT KNEE - 3 VIEW   COMPARISON:  01/04/2022   FINDINGS: Osseous  demineralization.   Slight narrowing of joint spaces.   No acute fracture, dislocation, or bone destruction.   No joint effusion.   IMPRESSION: Mild degenerative changes RIGHT knee without acute bony abnormalities.  CLINICAL DATA:  Chronic right knee pain.   EXAM: MRI OF THE RIGHT KNEE WITHOUT CONTRAST   TECHNIQUE: Multiplanar, multisequence MR imaging of the knee was performed. No intravenous contrast was administered.   COMPARISON:  Right knee x-rays dated January 04, 2022.   FINDINGS: MENISCI   Medial meniscus:  Intact.   Lateral meniscus: Degeneration and free edge fraying of the anterior and posterior roots without discrete tear. No extrusion.   LIGAMENTS   Cruciates:  Intact ACL and PCL.   Collaterals: Medial collateral ligament is intact. Lateral collateral ligament complex is intact.   CARTILAGE   Patellofemoral: Mild diffuse cartilage thinning without focal defect.   Medial:  Mild diffuse cartilage thinning without focal defect.   Lateral: Mild partial-thickness cartilage loss over the weight-bearing lateral femoral condyle and lateral tibial plateau. Near full-thickness cartilage loss over the posterior nonweightbearing lateral femoral condyle.   Joint: Small joint effusion. Mild edema within Hoffa's fat anterior to the lateral meniscus anterior horn.   Popliteal Fossa:  No Baker cyst. Intact popliteus tendon.   Extensor Mechanism: Intact quadriceps tendon and patellar tendon. Intact medial and lateral patellar retinaculum. Intact MPFL.   Bones: No acute fracture or dislocation. No suspicious bone lesion. Tiny tricompartmental marginal osteophytes.   Other: None.   IMPRESSION: 1. 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 2. Mild tricompartmental osteoarthritis, greatest in the lateral compartment. 3. Small joint effusion.   PATIENT SURVEYS:  FOTO  63  COGNITION: Overall cognitive status: Within functional limits for tasks assessed     SENSATION: WFL  EDEMA:  Yes right knee   PALPATION: Tender right knee lateral joint line and posterior capsule  LOWER EXTREMITY ROM:  Active ROM Right eval Left eval Right 05/01/23  Hip flexion     Hip extension     Hip abduction     Hip adduction     Hip internal rotation     Hip external rotation     Knee flexion 109* 124 116  Knee extension -8* 0 -7*  Ankle dorsiflexion     Ankle plantarflexion     Ankle inversion     Ankle eversion      (Blank rows = not tested)  LOWER EXTREMITY MMT:  MMT Right eval Left eval Right 03/31/23 Left 03/31/23 Right 05/01/23  Hip flexion 4+ 5   4+  Hip extension   3+ 3+   Hip abduction   3- 3+   Hip adduction       Hip internal rotation       Hip external rotation  Knee flexion(sitting isometric) 4* 5   4+  Knee extension 4-* 5   4+  Ankle dorsiflexion 4+ 5   5  Ankle plantarflexion       Ankle inversion       Ankle eversion        (Blank rows = not tested)   FUNCTIONAL TESTS:  5 times sit to stand: 29.08 sec with weight shifted to left knee 2 minute walk test: 225 ft no AD  GAIT: Distance walked: 225 ft Assistive device utilized: Single point cane Level of assistance: Modified independence Comments: slower than normal gait speed; right knee valgus   TODAY'S TREATMENT:                                                                                                                              DATE:  05/01/23 Bike seat 12 x 5 minutes full revolutions; dynamic warm up Progress note 5 time sit to stand 22.81 sec no UE assist 2 MWT with SPC 132 ft (dec from 225 ft at eval) FOTO  51 AROM and MMT's see above Review of HEP  Standing: Heel/toe raises 2 x 10 Hip abduction 2 x 10 Hip extension 2 x 10  04/27/23 Bike seat 12 x 5 minutes full revolutions; dynamic warm up Standing: Knee drives for flexion x 2' on 12" step Hip  abduction 2 x 10 Hip extension 2 x 10 4" step ups 2x10 Rt LE bil UE assist 4" lateral step ups 2x10 Rt with bil UE assist Tandem stance 2 x 30" each way intermittent HHA (5" max without UE) Vectors standing on Rt with 1 HHA 10X3" each Lunges onto 4" step no UE assist 10X each LE  04/15/23 Bike seat 12 x 5 minutes full revolutions; dynamic warm up Standing: Heel raises 2 x 10 Toe raises 2 x 10 Knee drives for flexion x 2' on 12" step Hip abduction 2 x 10 Hip extension 2 x 10 4" step ups x 10 each 4" lateral step ups x 10 Tandem stance 2 x 30" each way    04/07/23 Bike seat 12 rocking 5 minutes Standing:  R knee flexion stretch 10x10"  Heelraises 15X  Rt knee flexion 15X  Hip abduction 15X each  Hip extension 15X each  Forward lunge no UE 4" 10X  Forward step up 10X each with bil UE assist Supine:  bridge 10X  SLR 10X  Heelslides 10X Rt AROM Rt knee 107 degrees without pain, any further increase pain.   03/31/23 Bike seat 12 rocking 5 minutes Standing:  R knee flexion stretch 10x10"  Heelraises 15X  Hip abduction 10X each  Hip extension 10X each MMT of hip abduction/extension Supine:  bridge 10X  Hip abduction with GTB 10X  SLR 10X AROM Rt knee 100 degrees without pain, any further increase pain.  03/25/2023 physical therapy evaluation and HEP instruction    PATIENT EDUCATION:  Education details: Patient educated on exam  findings, POC, scope of PT, HEP, and what to expect next visit. Person educated: Patient Education method: Explanation, Demonstration, and Handouts Education comprehension: verbalized understanding, returned demonstration, verbal cues required, and tactile cues required  HOME EXERCISE PROGRAM: 04/15/23 tandem stance  Access Code: [email or web address] URL: [email or web address] Date: 03/25/2023 Prepared by: [facility] - Rehab  Exercises - Supine Quad Set  - 2 x daily - 7 x weekly - 1 sets - 10 reps - 5 sec hold - Long Sitting Calf Stretch with  Strap  - 2 x daily - 7 x weekly - 1 sets - 5 reps - 20 sec hold - Supine Knee Extension Stretch on Towel Roll  - 2 x daily - 7 x weekly - 1 sets - 1 reps - 1 min hold  Date: 04/07/2023 Prepared by: Emeline Gins Exercises - Supine Bridge  - 2 x daily - 7 x weekly - 1 sets - 10 reps - Active Straight Leg Raise with Quad Set  - 2 x daily - 7 x weekly - 1 sets - 10 reps - Standing Hip Abduction with Counter Support  - 2 x daily - 7 x weekly - 1 sets - 10 reps - Standing Hip Extension with Counter Support  - 2 x daily - 7 x weekly - 1 sets - 10 reps - Sit to Stand  - 2 x daily - 7 x weekly - 2 sets - 5 reps  ASSESSMENT:  CLINICAL IMPRESSION: Progress note today.  Patient continues with high pain levels.  Improved score on sit to stand test but significant decline in 2 MWT due to increased antalgic gait;  increased pain right knee with walking.  Good improvement with strength but functionally patient has declined. Discussed with patient returning to MD to discuss further options.  Patient agreeable to discharge at this time due to decline with functional progress.    OBJECTIVE IMPAIRMENTS: Abnormal gait, decreased activity tolerance, decreased mobility, difficulty walking, decreased ROM, decreased strength, hypomobility, increased edema, increased fascial restrictions, impaired perceived functional ability, impaired flexibility, and pain.   ACTIVITY LIMITATIONS: carrying, lifting, bending, sitting, standing, squatting, sleeping, stairs, transfers, locomotion level, and caring for others  PARTICIPATION LIMITATIONS: meal prep, cleaning, laundry, shopping, community activity, and yard work  [facility] POTENTIAL: Good  CLINICAL DECISION MAKING: Stable/uncomplicated  EVALUATION COMPLEXITY: Low   GOALS: Goals reviewed with patient? Yes  SHORT TERM GOALS: Target date: 04/08/2023 patient will be independent with initial HEP  Baseline: Goal status: MET  2.  Patient will self report 30% improvement  to improve tolerance for functional activity  Baseline:  Goal status: IN PROGRESS  LONG TERM GOALS: Target date: 05/01/2023  Patient will be independent in self management strategies to improve quality of life and functional outcomes.  Baseline:  Goal status: IN PROGRESS  2.  Patient will self report 50% improvement to improve tolerance for functional activity  Baseline:  Goal status: IN PROGRESS  3.  Patient will increased right knee mobility to -2 to 120 to promote normal navigation of steps; step over step pattern   Baseline:  Goal status: IN PROGRESS  4.  Patient will increase right leg MMTs to 5/5 without pain to promote return to ambulation community distances with minimal deviation.  Baseline:  Goal status: IN PROGRESS  5.  Patient will improve FOTO score to 70   Baseline: 63; 51 05/01/23 Goal status: IN PROGRESS  6.  Patient will increase distance on to 275 ft  to demonstrate improved functional mobility walking  household and community distances.   Baseline: 225 ft; 132 ft with [facility] 05/01/23 Goal status: IN PROGRESS   PLAN:  PT FREQUENCY: 2x/week  PT DURATION: 4 weeks  PLANNED INTERVENTIONS: Therapeutic exercises, Therapeutic activity, Neuromuscular re-education, Balance training, Gait training, Patient/Family education, Joint manipulation, Joint mobilization, Stair training, Orthotic/Fit training, DME instructions, Aquatic Therapy, Dry Needling, Electrical stimulation, Spinal manipulation, Spinal mobilization, Cryotherapy, Moist heat, Compression bandaging, scar mobilization, Splintting, Taping, Traction, Ultrasound, Ionotophoresis 4mg /ml Dexamethasone, and Manual therapy   PLAN FOR NEXT SESSION: discharge   10:24 AM, 05/01/23  Small  MPT [place] [facility] [place] [phone] Ph:[phone]

## 2023-05-04 ENCOUNTER — Encounter (HOSPITAL_COMMUNITY): Payer: 59

## 2023-05-05 ENCOUNTER — Other Ambulatory Visit: Payer: Self-pay

## 2023-05-05 ENCOUNTER — Telehealth: Payer: Self-pay | Admitting: Family Medicine

## 2023-05-05 DIAGNOSIS — J309 Allergic rhinitis, unspecified: Secondary | ICD-10-CM

## 2023-05-05 DIAGNOSIS — M1990 Unspecified osteoarthritis, unspecified site: Secondary | ICD-10-CM

## 2023-05-05 DIAGNOSIS — I10 Essential (primary) hypertension: Secondary | ICD-10-CM

## 2023-05-05 DIAGNOSIS — E7800 Pure hypercholesterolemia, unspecified: Secondary | ICD-10-CM

## 2023-05-05 DIAGNOSIS — F411 Generalized anxiety disorder: Secondary | ICD-10-CM

## 2023-05-05 MED ORDER — HYDROCHLOROTHIAZIDE 25 MG PO TABS
25.0000 mg | ORAL_TABLET | Freq: Every day | ORAL | 1 refills | Status: DC
Start: 2023-05-05 — End: 2023-10-29

## 2023-05-05 MED ORDER — CITALOPRAM HYDROBROMIDE 20 MG PO TABS
20.0000 mg | ORAL_TABLET | Freq: Every day | ORAL | 1 refills | Status: DC
Start: 2023-05-05 — End: 2024-02-29

## 2023-05-05 MED ORDER — LEVOCETIRIZINE DIHYDROCHLORIDE 5 MG PO TABS
5.0000 mg | ORAL_TABLET | Freq: Every evening | ORAL | 1 refills | Status: DC
Start: 2023-05-05 — End: 2023-12-03

## 2023-05-05 MED ORDER — ATORVASTATIN CALCIUM 20 MG PO TABS
20.0000 mg | ORAL_TABLET | Freq: Every day | ORAL | 1 refills | Status: DC
Start: 2023-05-05 — End: 2024-04-04

## 2023-05-05 MED ORDER — MELOXICAM 7.5 MG PO TABS
7.5000 mg | ORAL_TABLET | Freq: Every day | ORAL | 1 refills | Status: DC
Start: 2023-05-05 — End: 2023-06-18

## 2023-05-05 NOTE — Telephone Encounter (Signed)
Prescription Request  05/05/2023  LOV: 01/22/2023  What is the name of the medication or equipment? levocetirizine (XYZAL) 5 MG tablet , meloxicam (MOBIC) 7.5 MG tablet , hydrochlorothiazide (HYDRODIURIL) 25 MG tablet , citalopram (CELEXA) 20 MG tablet , and atorvastatin (LIPITOR) 20 MG tablet   Have you contacted your pharmacy to request a refill? Yes   Which pharmacy would you like this sent to?  [facility] - [place], [place] - [address] [place] [address] Phone: [phone] Fax: [phone]    Patient notified that their request is being sent to the clinical staff for review and that they should receive a response within 2 business days.   Please advise at [facility] [phone]

## 2023-05-06 ENCOUNTER — Other Ambulatory Visit (INDEPENDENT_AMBULATORY_CARE_PROVIDER_SITE_OTHER): Payer: Medicare PPO

## 2023-05-06 ENCOUNTER — Encounter (HOSPITAL_COMMUNITY): Payer: 59 | Admitting: Physical Therapy

## 2023-05-06 ENCOUNTER — Ambulatory Visit (INDEPENDENT_AMBULATORY_CARE_PROVIDER_SITE_OTHER): Payer: Medicare PPO | Admitting: Orthopedic Surgery

## 2023-05-06 ENCOUNTER — Encounter: Payer: Self-pay | Admitting: Orthopedic Surgery

## 2023-05-06 VITALS — BP 150/84 | HR 78 | Ht 66.0 in | Wt 175.6 lb

## 2023-05-06 DIAGNOSIS — M1711 Unilateral primary osteoarthritis, right knee: Secondary | ICD-10-CM

## 2023-05-06 DIAGNOSIS — G8929 Other chronic pain: Secondary | ICD-10-CM

## 2023-05-06 NOTE — Patient Instructions (Signed)
Follow-up with Dr. Harrison °

## 2023-05-06 NOTE — Progress Notes (Signed)
Return patient Visit  Assessment: Wendy Newman is a 82 y.o. female with the following: Chronic right knee pain  Plan: Wendy Newman continues to have right knee pain.  She has had minimal relief with a steroid injection.  She has recently been working with physical therapy, and her knee pain is getting worse.  She notes pain behind her kneecap, as well as over the lateral joint.  She does have some radiating pains distally.  She has a valgus alignment on the right, with neutral alignment on the left.  We obtained updated x-rays in clinic today, which demonstrates complete loss of joint space within the lateral compartment.  There is also evidence of advancing degenerative disease behind the patella.  We discussed multiple treatment options including steroid injection, hyaluronic acid injection and referral to see Dr. Romeo Apple for further discussion regarding her knee pain.  She would like to do anything she can to help with the pain at this point.  As such, I am recommending a referral to see Dr. Romeo Apple.  She will contact me if she has any questions or concerns.  Follow-up: Return for Schedule an appointment with Dr. Romeo Apple .  Subjective:  Chief Complaint  Patient presents with   Knee Pain    R knee pain and swelling with some giving way     History of Present Illness: Wendy Newman is a 82 y.o. female who returns to clinic for repeat evaluation of her right knee.  She has had intermittent pain in the right knee for over a year.  Steroid injection provided minimal sustained relief.  She has tried a brace, but this made her pain worse.  More recently, she has been working with physical therapy.  Her pain continues to progress.  She localizes the pain to around her patella, as well as the lateral joint line.  She has some pain in the posterior and lateral aspect of the right knee.  She is currently walking with a cane.  Review of Systems: No fevers or chills.   No numbness or  tingling No chest pain No shortness of breath No bowel or bladder dysfunction No GI distress No headaches  Objective: BP (!) 150/84   Pulse 78   Ht 5\' 6"  (1.676 m)   Wt 175 lb 9.6 oz (79.7 kg)   BMI 28.34 kg/m   Physical Exam:  General: Alert and oriented. and No acute distress. Gait: Right sided antalgic gait.  Right knee with mild swelling.  Valgus alignment.  No increased laxity varus or valgus stress.  She has exquisite tenderness to palpation along the lateral joint line.  She has difficulty relaxing in order to mobilize the patella.  Minimal tenderness along the medial joint line.  Toes warm well-perfused.  2+ DP pulse.   IMAGING: I personally ordered and reviewed the following images  X-rays of the right knee were obtained in clinic today.  No acute injuries are noted.  Valgus alignment overall.  Near complete loss of joint space within the lateral compartment.  Minimal osteophytes are appreciated.  Mild to moderate degenerative changes within the patellofemoral compartment, including small osteophytes.  No bony lesions.  Impression: Right knee x-rays with valgus alignment, and advanced degenerative changes within the lateral compartment.  New Medications:  No orders of the defined types were placed in this encounter.     Oliver Barre, MD  05/06/2023 10:06 AM

## 2023-05-19 ENCOUNTER — Other Ambulatory Visit: Payer: Self-pay

## 2023-05-19 ENCOUNTER — Telehealth: Payer: Self-pay | Admitting: Family Medicine

## 2023-05-19 DIAGNOSIS — M810 Age-related osteoporosis without current pathological fracture: Secondary | ICD-10-CM

## 2023-05-19 DIAGNOSIS — J309 Allergic rhinitis, unspecified: Secondary | ICD-10-CM

## 2023-05-19 MED ORDER — FLUTICASONE PROPIONATE 50 MCG/ACT NA SUSP
2.0000 | Freq: Every day | NASAL | 2 refills | Status: AC
Start: 2023-05-19 — End: ?

## 2023-05-19 MED ORDER — ALENDRONATE SODIUM 70 MG PO TABS
70.0000 mg | ORAL_TABLET | ORAL | 2 refills | Status: DC
Start: 2023-05-19 — End: 2024-01-25

## 2023-05-19 NOTE — Telephone Encounter (Signed)
Prescription Request  05/19/2023  LOV: 01/22/2023  What is the name of the medication or equipment?   alendronate (FOSAMAX) 70 MG tablet   fluticasone (FLONASE) 50 MCG/ACT nasal spray [[id number]]   Have you contacted your pharmacy to request a refill? Yes   Which pharmacy would you like this sent to?  [facility] - [place], [place] - [address] [place] [address] Phone: [phone] Fax: [phone]    Patient notified that their request is being sent to the clinical staff for review and that they should receive a response within 2 business days.   Please advise pharmacist.

## 2023-05-21 ENCOUNTER — Ambulatory Visit (INDEPENDENT_AMBULATORY_CARE_PROVIDER_SITE_OTHER): Payer: Medicare PPO | Admitting: Orthopedic Surgery

## 2023-05-21 ENCOUNTER — Encounter: Payer: Self-pay | Admitting: Orthopedic Surgery

## 2023-05-21 VITALS — BP 179/73 | HR 59 | Ht 66.0 in | Wt 175.0 lb

## 2023-05-21 DIAGNOSIS — Z01818 Encounter for other preprocedural examination: Secondary | ICD-10-CM | POA: Diagnosis not present

## 2023-05-21 DIAGNOSIS — M1711 Unilateral primary osteoarthritis, right knee: Secondary | ICD-10-CM | POA: Diagnosis not present

## 2023-05-21 MED ORDER — BUPIVACAINE-MELOXICAM ER 400-12 MG/14ML IJ SOLN
400.0000 mg | Freq: Once | INTRAMUSCULAR | Status: DC
Start: 2023-05-21 — End: 2023-06-18

## 2023-05-21 NOTE — Progress Notes (Signed)
Patient: Wendy Newman           Date of Birth: 06-25-1941           MRN: [id number] Visit Date: 05/21/2023 Requested by: Donita Brooks, MD [address] [place],  [place] [address] PCP: Donita Brooks, MD    Chief Complaint  Patient presents with   Knee Pain    Right/ states pain for long duration couple years, not better with injections and also has pain right lower leg when bending over       Wendy Newman is 82 year old female history of DVT left leg comes in for right knee pain with osteoarthritis failing injection physical therapy and anti-inflammatories  She is no longer on Eliquis status post blood clot in her left leg back in 12/22  She has hypertension osteoporosis  She is ready to get something done to her right knee because of disabling knee pain  She does not want to proceed with hyaluronic injections because she will eventually wind up knee replacement anyway    Body mass index is 28.25 kg/m.   Problem list, medical hx, medications and allergies reviewed   Review of Systems  All other systems reviewed and are negative.    No Known Allergies  BP (!) 179/73   Pulse (!) 59   Ht 5\' 6"  (1.676 m)   Wt 175 lb (79.4 kg)   BMI 28.25 kg/m    Physical exam: Physical Exam Vitals and nursing note reviewed.  Constitutional:      General: She is not in acute distress.    Appearance: Normal appearance. She is not ill-appearing, toxic-appearing or diaphoretic.  HENT:     Head: Normocephalic and atraumatic.     Nose: Nose normal. No congestion or rhinorrhea.  Eyes:     General: No scleral icterus.       Right eye: No discharge.        Left eye: No discharge.     Extraocular Movements: Extraocular movements intact.     Conjunctiva/sclera: Conjunctivae normal.     Pupils: Pupils are equal, round, and reactive to light.  Cardiovascular:     Pulses: Normal pulses.  Pulmonary:     Effort: Pulmonary effort is normal.     Breath sounds: No wheezing.   Musculoskeletal:     Right knee: Bony tenderness present. No swelling, deformity, effusion, erythema, ecchymosis or lacerations. Decreased range of motion. Tenderness present over the medial joint line and patellar tendon. No LCL laxity, MCL laxity, ACL laxity or PCL laxity. Abnormal alignment.     Instability Tests: Anterior drawer test negative. Posterior drawer test negative.     Right lower leg: Swelling present. Pitting Edema present.     Left lower leg: Swelling present. Pitting Edema present.  Skin:    General: Skin is warm and dry.     Capillary Refill: Capillary refill takes less than 2 seconds.     Coloration: Skin is not jaundiced.     Findings: No erythema.  Neurological:     General: No focal deficit present.     Mental Status: She is alert and oriented to person, place, and time.     Gait: Gait abnormal.  Psychiatric:        Mood and Affect: Mood normal.        Behavior: Behavior normal.        Thought Content: Thought content normal.        Judgment: Judgment normal.  Data reviewed:   Imaging studies show grade 3 arthritis without osteophytes with valgus alignment of the right knee there is increased valgus alignment to the joint  She also had an MRI of the knee which showed degeneration and free edge fraying of the lateral meniscus without tear mild 3 compartment arthritis with full-thickness cartilage loss on the lateral femoral condyle  Assessment and plan:  Encounter Diagnoses  Name Primary?   Unilateral primary osteoarthritis, right knee Yes   Pre-op exam     The procedure has been fully reviewed with the patient; The risks and benefits of surgery have been discussed and explained and understood. Alternative treatment has also been reviewed, questions were encouraged and answered. The postoperative plan is also been reviewed.  End-stage arthritis right knee patient opts for surgical intervention  Risk and benefits explained to the patient and she agrees  to proceed with surgery for a right total knee arthroplasty  We specifically discussed bleeding infection blood clots and nerve stretching due to the valgus knee  The patient has no steps to get in the house.  She will get someone to stay with her for week.  She is expected to go home the next day.     Meds ordered this encounter  Medications   bupivacaine-meloxicam ER (ZYNRELEF) injection 400 mg    Procedures:   No

## 2023-05-21 NOTE — Patient Instructions (Addendum)
Your surgery will be at [facility] by Dr Romeo Apple  The hospital will contact you with a preoperative appointment to discuss Anesthesia.  Please arrive on time or 15 minutes early for the preoperative appointment, they have a very tight schedule if you are late or do not come in your surgery will be cancelled.  The phone number is [phone]. Please bring your medications with you for the appointment. They will tell you the arrival time and medication instructions when you have your preoperative evaluation. Do not wear nail polish the day of your surgery and if you take Phentermine you need to stop this medication ONE WEEK prior to your surgery. If you take Docia Barrier, Jardiance, or Steglatro) - Hold 72 hours before the procedure.  If you take Ozempic,  Mounjaro, Bydureon or Trulicity do not take for 8 days before your surgery. If you take Victoza, Rybelsis, Saxenda or Adlyxi stop 24 hours before the procedure.  Please arrive at the hospital 2 hours before procedure if scheduled at 9:30 or later in the day or at the time the nurse tells you at your preoperative visit.   If you have my chart do not use the time given in my chart use the time given to you by the nurse during your preoperative visit.   Your surgery  time may change. Please be available for phone calls the day of your surgery and the day before. The Short Stay department may need to discuss changes about your surgery time. Not reaching the you could lead to procedure delays and possible cancellation.  You must have a ride home and someone to stay with you for 24 to 48 hours. The person taking you home will receive and sign for the your discharge instructions.  Please be prepared to give your support person's name and telephone number to [facility] [employer]. Dr Romeo Apple will need that name and phone number post procedure.   You have decided to proceed with knee replacement surgery. You have decided not to continue with  nonoperative measures such as but not limited to oral medication, weight loss, activity modification, physical therapy, bracing, or injection.  We will perform the procedure commonly known as total knee replacement. Some of the risks associated with knee replacement surgery include but are not limited to Bleeding Infection Swelling Stiffness Blood clot Pulmonary embolism  Loosening of the implant Pain that persists even after surgery  Infection is especially devastating complication of knee surgery although rare. If infection does occur your implant will usually have to be removed and several surgeries and antibiotics will be needed to eradicate the infection prior to performing a repeat replacement.   In some cases amputation is required to eradicate the infection. In other rare cases a knee fusion is needed   In compliance with recent [place] law in federal regulation regarding opioid use and abuse and addiction, we will taper (stop) opioid medication after 2 weeks.  If you're not comfortable with these risks and would like to continue with nonoperative treatment please let Dr. Romeo Apple know prior to your surgery.

## 2023-05-28 IMAGING — DX DG KNEE COMPLETE 4+V*R*
4 series · 4 of 4 positions shown · non-contrast
Comparison: 08/25/2016

CLINICAL DATA: atraumatic pain, swelling

EXAM:
RIGHT KNEE - COMPLETE 4+ VIEW

[knee ap]
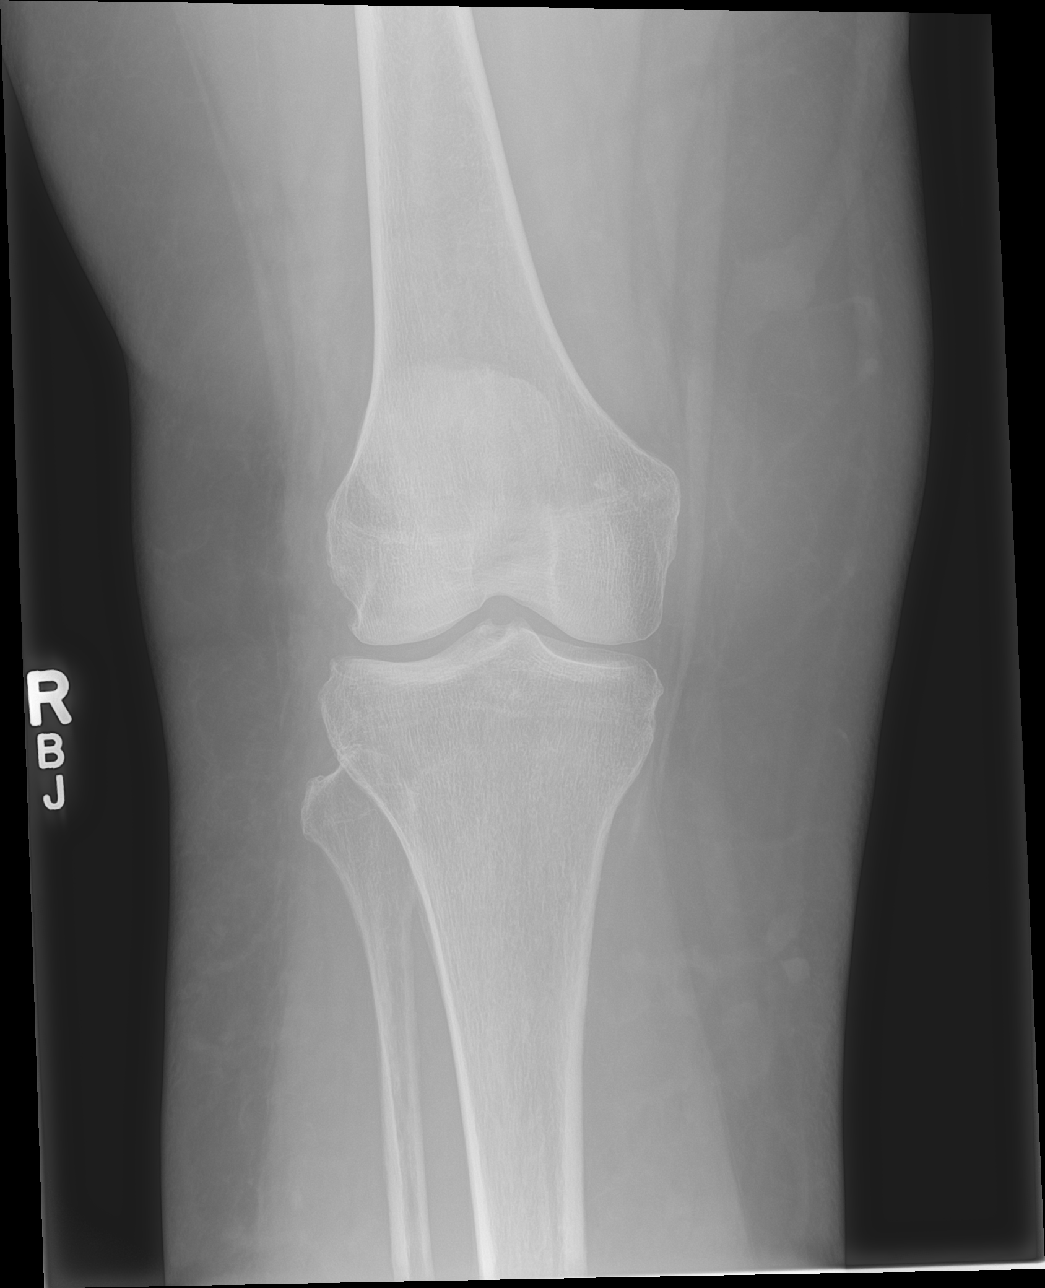

[knee obl (1 of 2)]
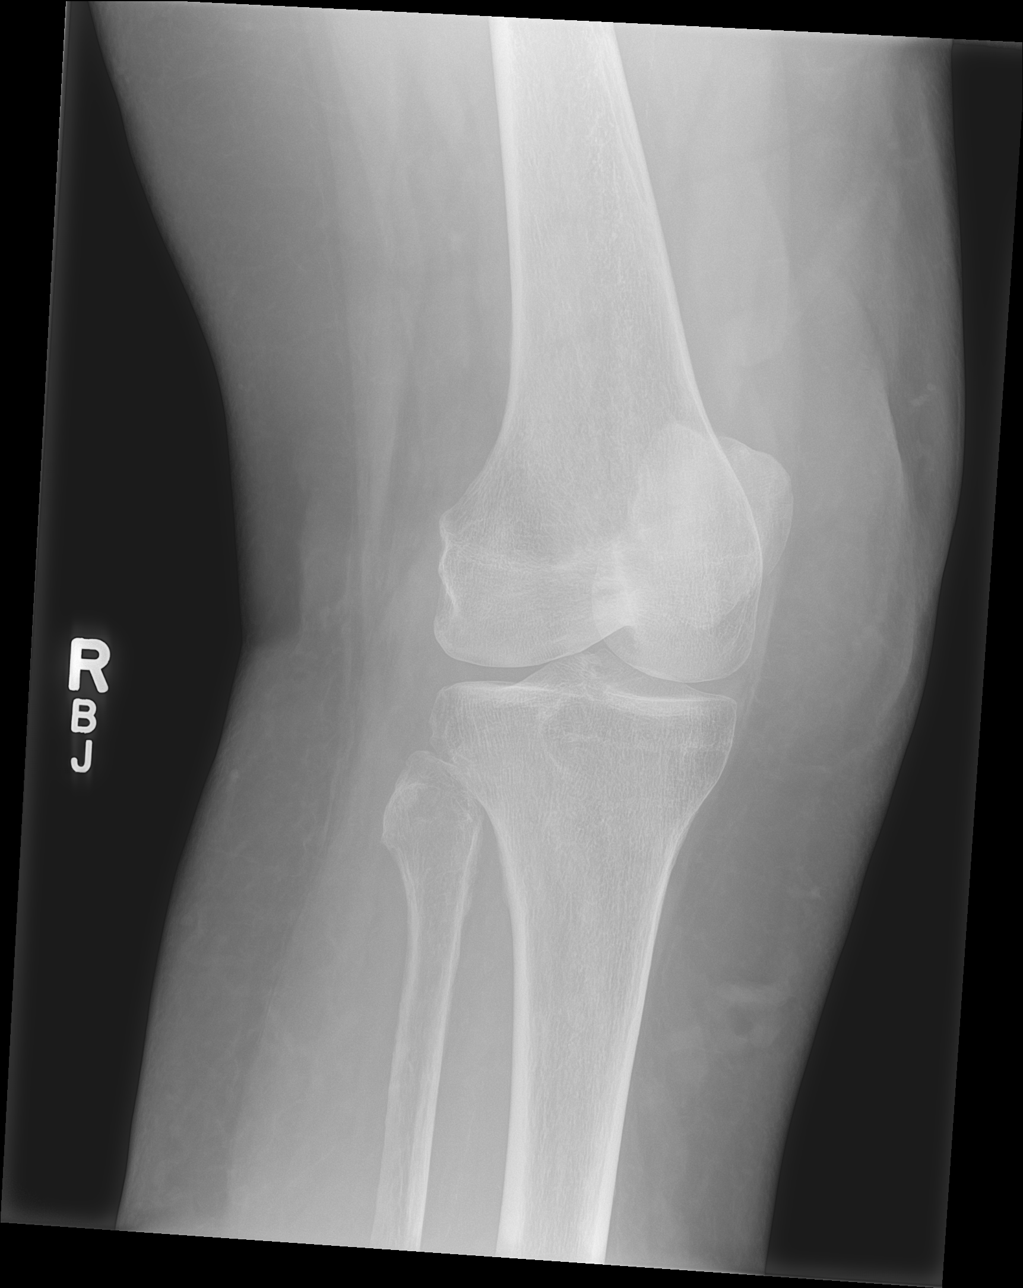

[knee obl (2 of 2)]
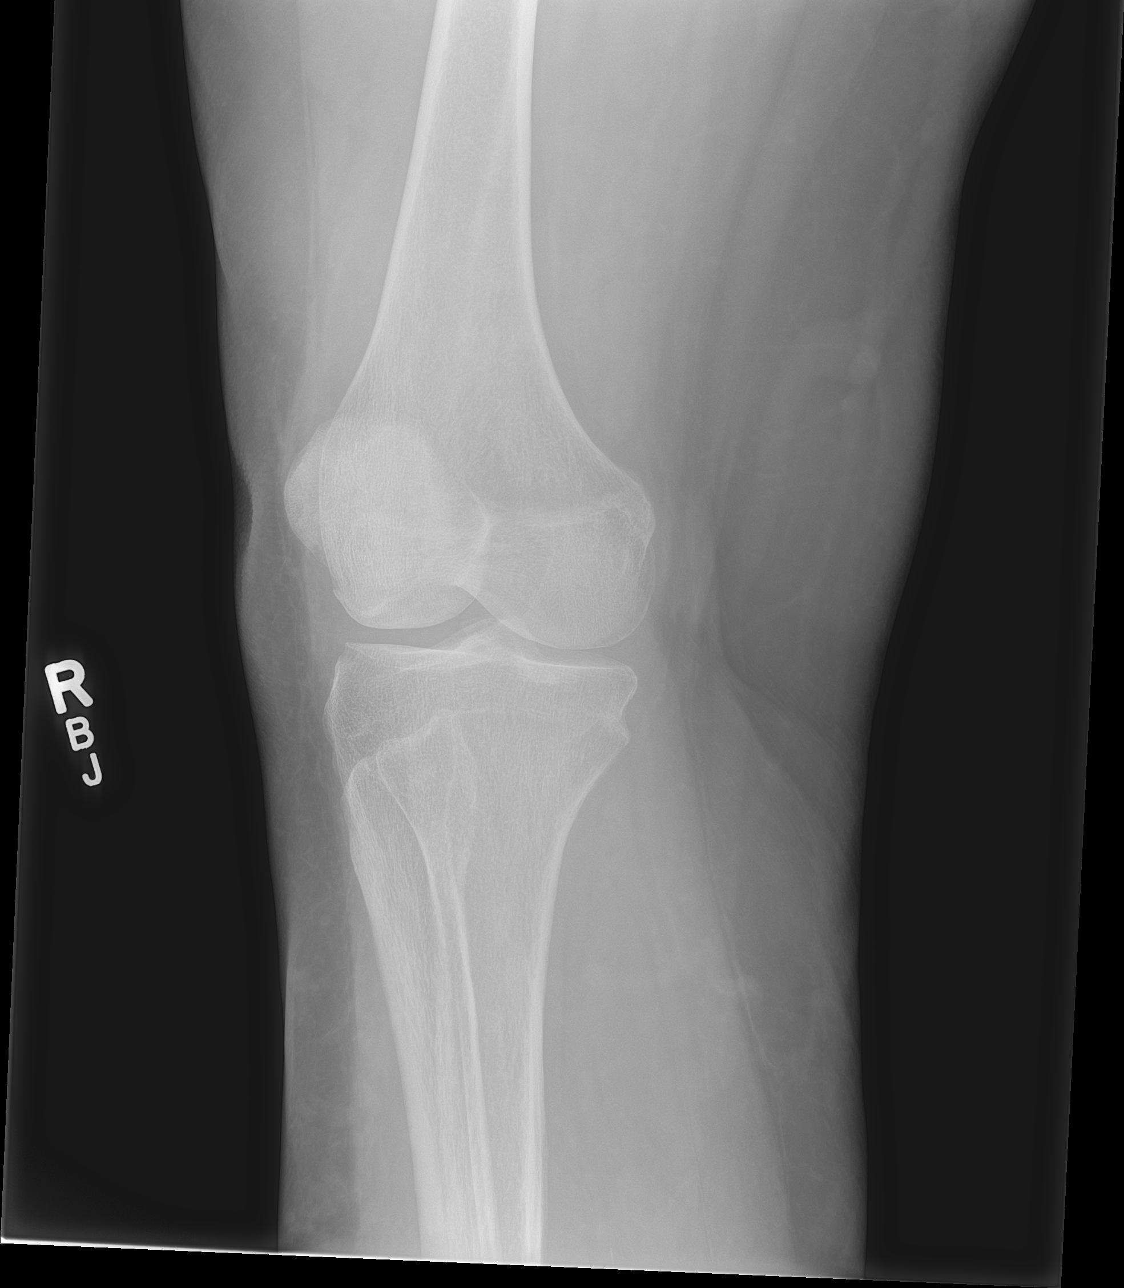

[knee lat]
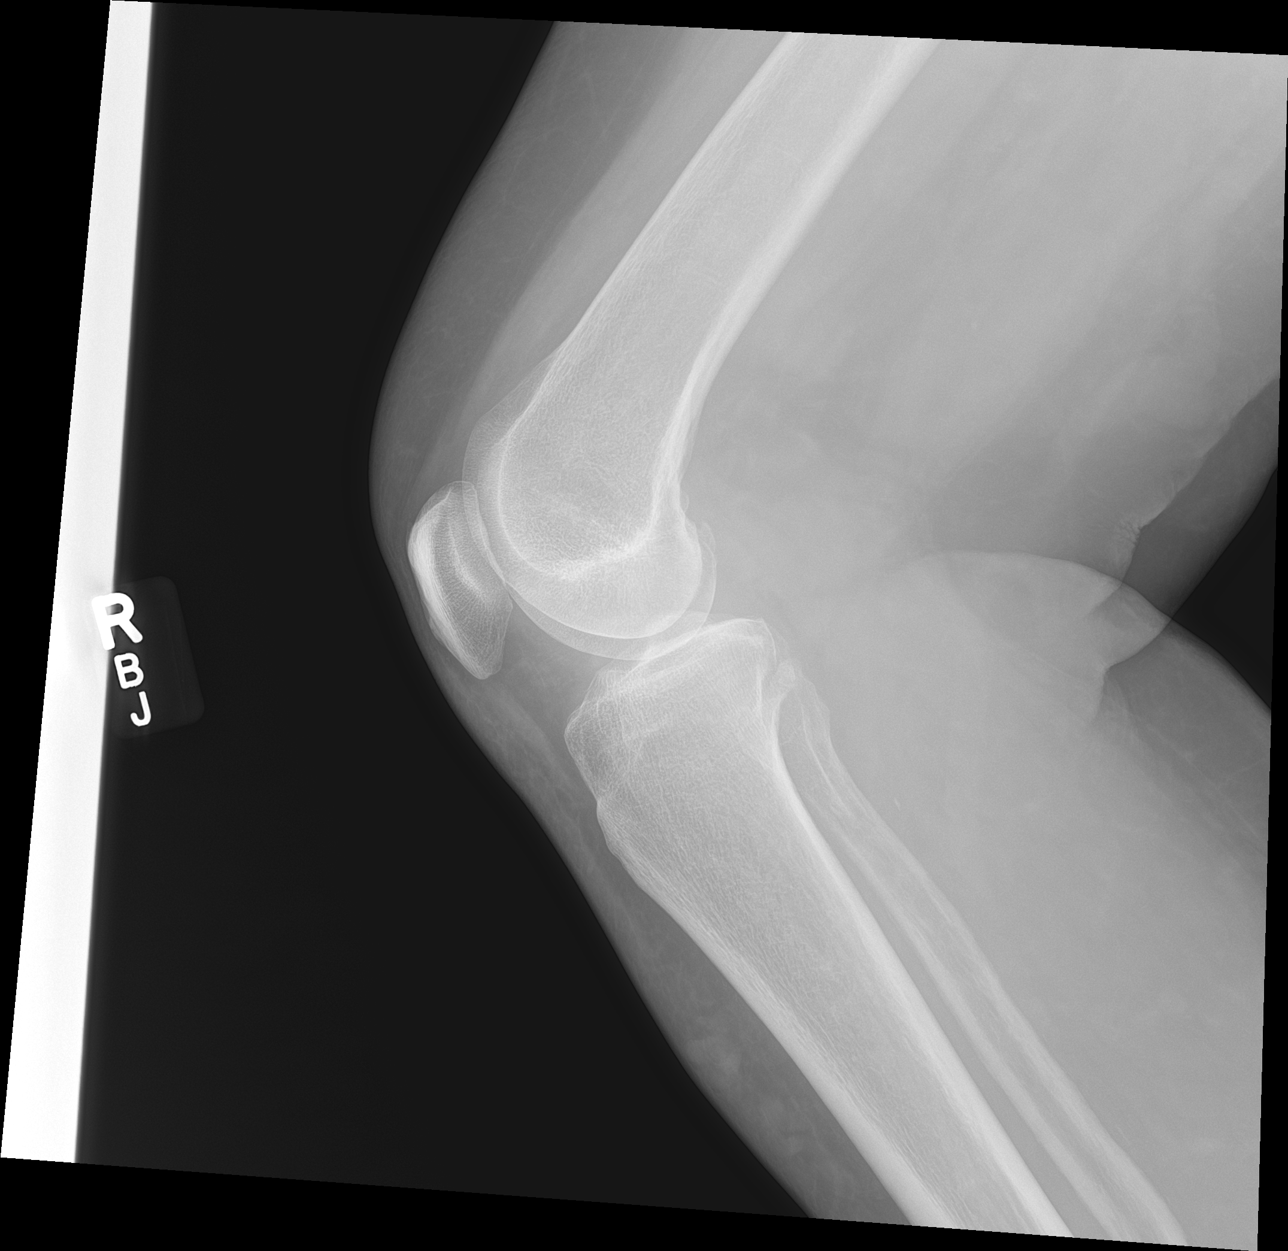

[4 of 4 positions shown; findings below may reference images not displayed]

FINDINGS: No evidence of fracture, dislocation, or joint effusion. No evidence
of arthropathy or other focal bone abnormality. Soft tissues are
unremarkable.
IMPRESSION: Negative.

## 2023-06-03 NOTE — Patient Instructions (Addendum)
Wendy Newman  06/03/2023     @PREFPERIOPPHARMACY @   Your procedure is scheduled on  06/16/2023.   Report to [facility] at  0600  A.M.   Call this number if you have problems the morning of surgery:  [phone]  If you experience any cold or flu symptoms such as cough, fever, chills, shortness of breath, etc. between now and your scheduled surgery, please notify us at the above number.   Remember:  Do not eat or drink after midnight.      Take these medicines the morning of surgery with A SIP OF WATER                         citalopram, meloxicam(if needed).     Do not wear jewelry, make-up or nail polish, including gel polish,  artificial nails, or any other type of covering on natural nails (fingers and  toes).  Do not wear lotions, powders, or perfumes, or deodorant.  Do not shave 48 hours prior to surgery.  Men may shave face and neck.  Do not bring valuables to the hospital.  [facility] is not responsible for any belongings or valuables.  Contacts, dentures or bridgework may not be worn into surgery.  Leave your suitcase in the car.  After surgery it may be brought to your room.  For patients admitted to the hospital, discharge time will be determined by your treatment team.  Patients discharged the day of surgery will not be allowed to drive home and must have someone with them for 24 hours.    Special instructions:   DO NOT smoke tobacco or vape for 24 hours before your procedure.  Please read over the following fact sheets that you were given. Pain Booklet, Coughing and Deep Breathing, Blood Transfusion Information, Total Joint Packet, MRSA Information, Surgical Site Infection Prevention, Anesthesia Post-op Instructions, and Care and Recovery After Surgery       Total Knee Replacement, Care After This sheet gives you information about how to care for yourself after your procedure. Your health care provider may also give you more specific  instructions. If you have problems or questions, contact your health care provider. What can I expect after the procedure? After the procedure, it is common to have: Redness, pain, and swelling at the incision area. Stiffness. Discomfort. A small amount of blood or clear fluid coming from your incision. Follow these instructions at home: Medicines Take over-the-counter and prescription medicines only as told by your health care provider. If you were prescribed a blood thinner (anticoagulant), take it as told by your health care provider. Ask your health care provider if the medicine prescribed to you: Requires you to avoid driving or using machinery. Can cause constipation. You may need to take these actions to prevent or treat constipation: Drink enough fluid to keep your urine pale yellow. Take over-the-counter or prescription medicines. Eat foods that are high in fiber, such as beans, whole grains, and fresh fruits and vegetables. Limit foods that are high in fat and processed sugars, such as fried or sweet foods. Incision care  Follow instructions from your health care provider about how to take care of your incision. Make sure you: Wash your hands with soap and water for at least 20 seconds before and after you change your bandage (dressing). If soap and water are not available, use hand sanitizer. Change your dressing as told by your health care  provider. Leave stitches (sutures), staples, skin glue, or adhesive strips in place. These skin closures may need to stay in place for 2 weeks or longer. If adhesive strip edges start to loosen and curl up, you may trim the loose edges. Do not remove adhesive strips completely unless your health care provider tells you to do that. Do not take baths, swim, or use a hot tub until your health care provider approves. Check your incision area every day for signs of infection. Check for: More redness, swelling, or pain. More fluid or  blood. Warmth. Pus or a bad smell. Activity Rest as told by your health care provider. Avoid sitting for a long time without moving. Get up to take short walks every 1-2 hours. This is important to improve blood flow and breathing. Ask for help if you feel weak or unsteady. Follow instructions from your health care provider about using a walker, crutches, or a cane. You may use your legs to support (bear) your body weight as told by your health care provider. Follow instructions about how much weight you may safely support on your affected leg (weight-bearing restrictions). A physical therapist may show you how to get out of a bed and chair and how to go up and down stairs. You will first do this with a walker, crutches, or a cane and then without any of these devices. Once you are able to walk without a limp, you may stop using a walker, crutches, or a cane. Do exercises as told by your health care provider or physical therapist. Avoid high-impact activities, including running, jumping rope, and doing jumping jacks. Do not play contact sports until your health care provider approves. Return to your normal activities as told by your health care provider. Ask your health care provider what activities are safe for you. Managing pain, stiffness, and swelling  If directed, put ice on your knee. To do this: Put ice in a plastic bag or use the icing device (cold flow pad) that you were given. Follow instructions from your health care provider about how to use the icing device. Place a towel between your skin and the bag or between your skin and the icing device. Leave the ice on for 20 minutes, 2-3 times a day. Remove the ice if your skin turns bright red. This is very important. If you cannot feel pain, heat, or cold, you have a greater risk of damage to the area. Move your toes often to reduce stiffness and swelling. Raise (elevate) your leg above the level of your heart while you are sitting or  lying down. Use several pillows to keep your leg straight. Do not put a pillow just under the knee. If the knee is bent for a long time, this may lead to stiffness. Wear elastic knee support as told by your health care provider. Safety  To help prevent falls, keep floors clear of objects you may trip over. Place items that you may need within easy reach. Wear an apron or tool belt with pockets for carrying objects. This leaves your hands free to help with your balance. Ask your health care provider when it is safe to drive. General instructions Wear compression stockings as told by your health care provider. These stockings help to prevent blood clots and reduce swelling in your legs. Continue with breathing exercises. This helps prevent lung infection. Do not use any products that contain nicotine or tobacco. These products include cigarettes, chewing tobacco, and vaping devices, such as  e-cigarettes. These can delay healing after surgery. If you need help quitting, ask your health care provider. Tell your health care provider if you plan to have dental work. Also: Tell your dentist about your joint replacement. Ask your health care provider if there are any special instructions you need to follow before having dental care and routine cleanings. Keep all follow-up visits. This is important. Contact a health care provider if: You have a fever or chills. You have a cough or feel short of breath. Your medicine is not controlling your pain. You have any of these signs of infection: More redness, swelling, or pain around your incision. More fluid or blood coming from your incision. Warmth coming from your incision. Pus or a bad smell coming from your incision. You fall. Get help right away if: You have severe pain. You have trouble breathing. You have chest pain. You have redness, swelling, pain, or warmth in your calf or leg. Your incision breaks open after sutures or staples are  removed. These symptoms may represent a serious problem that is an emergency. Do not wait to see if the symptoms will go away. Get medical help right away. Call your local emergency services (911 in the [place] Do not drive yourself to the hospital. Summary After the procedure, it is common to have pain and swelling at the incision area, a small amount of blood or fluid coming from your incision, and stiffness. Follow instructions from your health care provider about how to take care of your incision. Use crutches, a walker, or a cane as told by your health care provider. This information is not intended to replace advice given to you by your health care provider. Make sure you discuss any questions you have with your health care provider. Document Revised: 04/02/2020 Document Reviewed: 04/03/2020 Elsevier Patient Education  2024 [employer] General Anesthesia, Adult, Care After The following information offers guidance on how to care for yourself after your procedure. Your health care provider may also give you more specific instructions. If you have problems or questions, contact your health care provider. What can I expect after the procedure? After the procedure, it is common for people to: Have pain or discomfort at the IV site. Have nausea or vomiting. Have a sore throat or hoarseness. Have trouble concentrating. Feel cold or chills. Feel weak, sleepy, or tired (fatigue). Have soreness and body aches. These can affect parts of the body that were not involved in surgery. Follow these instructions at home: For the time period you were told by your health care provider:  Rest. Do not participate in activities where you could fall or become injured. Do not drive or use machinery. Do not drink alcohol. Do not take sleeping pills or medicines that cause drowsiness. Do not make important decisions or sign legal documents. Do not take care of children on your own. General  instructions Drink enough fluid to keep your urine pale yellow. If you have sleep apnea, surgery and certain medicines can increase your risk for breathing problems. Follow instructions from your health care provider about wearing your sleep device: Anytime you are sleeping, including during daytime naps. While taking prescription pain medicines, sleeping medicines, or medicines that make you drowsy. Return to your normal activities as told by your health care provider. Ask your health care provider what activities are safe for you. Take over-the-counter and prescription medicines only as told by your health care provider. Do not use any products that contain nicotine or tobacco. These  products include cigarettes, chewing tobacco, and vaping devices, such as e-cigarettes. These can delay incision healing after surgery. If you need help quitting, ask your health care provider. Contact a health care provider if: You have nausea or vomiting that does not get better with medicine. You vomit every time you eat or drink. You have pain that does not get better with medicine. You cannot urinate or have bloody urine. You develop a skin rash. You have a fever. Get help right away if: You have trouble breathing. You have chest pain. You vomit blood. These symptoms may be an emergency. Get help right away. Call 911. Do not wait to see if the symptoms will go away. Do not drive yourself to the hospital. Summary After the procedure, it is common to have a sore throat, hoarseness, nausea, vomiting, or to feel weak, sleepy, or fatigue. For the time period you were told by your health care provider, do not drive or use machinery. Get help right away if you have difficulty breathing, have chest pain, or vomit blood. These symptoms may be an emergency. This information is not intended to replace advice given to you by your health care provider. Make sure you discuss any questions you have with your health  care provider. Document Revised: 01/10/2022 Document Reviewed: 01/10/2022 Elsevier Patient Education  2024 [employer].

## 2023-06-08 ENCOUNTER — Encounter (HOSPITAL_COMMUNITY)
Admission: RE | Admit: 2023-06-08 | Discharge: 2023-06-08 | Disposition: A | Discharge status: Home / Self Care | Payer: Medicare PPO | Source: Ambulatory Visit | Attending: Orthopedic Surgery | Admitting: Orthopedic Surgery

## 2023-06-08 ENCOUNTER — Other Ambulatory Visit: Payer: Self-pay

## 2023-06-08 ENCOUNTER — Encounter (HOSPITAL_COMMUNITY): Payer: Self-pay

## 2023-06-08 VITALS — BP 179/73 | HR 59 | Temp 97.8°F | Resp 18 | Ht 66.0 in | Wt 175.0 lb

## 2023-06-08 DIAGNOSIS — R001 Bradycardia, unspecified: Secondary | ICD-10-CM | POA: Diagnosis not present

## 2023-06-08 DIAGNOSIS — I444 Left anterior fascicular block: Secondary | ICD-10-CM | POA: Diagnosis not present

## 2023-06-08 DIAGNOSIS — I10 Essential (primary) hypertension: Secondary | ICD-10-CM | POA: Insufficient documentation

## 2023-06-08 DIAGNOSIS — Z01818 Encounter for other preprocedural examination: Secondary | ICD-10-CM | POA: Insufficient documentation

## 2023-06-08 DIAGNOSIS — M1711 Unilateral primary osteoarthritis, right knee: Secondary | ICD-10-CM | POA: Insufficient documentation

## 2023-06-08 DIAGNOSIS — I471 Supraventricular tachycardia, unspecified: Secondary | ICD-10-CM | POA: Insufficient documentation

## 2023-06-08 HISTORY — DX: Other specified postprocedural states: Z98.890

## 2023-06-08 LAB — CBC WITH DIFFERENTIAL/PLATELET
Abs Immature Granulocytes: 0.03 10*3/uL (ref 0.00–0.07)
Basophils Absolute: 0 10*3/uL (ref 0.0–0.1)
Basophils Relative: 0 %
Eosinophils Absolute: 0.1 10*3/uL (ref 0.0–0.5)
Eosinophils Relative: 1 %
HCT: 37.3 % (ref 36.0–46.0)
Hemoglobin: 12.1 g/dL (ref 12.0–15.0)
Immature Granulocytes: 1 %
Lymphocytes Relative: 37 %
Lymphs Abs: 2.4 10*3/uL (ref 0.7–4.0)
MCH: 30.3 pg (ref 26.0–34.0)
MCHC: 32.4 g/dL (ref 30.0–36.0)
MCV: 93.3 fL (ref 80.0–100.0)
Monocytes Absolute: 0.6 10*3/uL (ref 0.1–1.0)
Monocytes Relative: 9 %
Neutro Abs: 3.3 10*3/uL (ref 1.7–7.7)
Neutrophils Relative %: 52 %
Platelets: 194 10*3/uL (ref 150–400)
RBC: 4 MIL/uL (ref 3.87–5.11)
RDW: 13.6 % (ref 11.5–15.5)
WBC: 6.3 10*3/uL (ref 4.0–10.5)
nRBC: 0 % (ref 0.0–0.2)

## 2023-06-08 LAB — BASIC METABOLIC PANEL
Anion gap: 9 (ref 5–15)
BUN: 21 mg/dL (ref 8–23)
CO2: 24 mmol/L (ref 22–32)
Calcium: 8.7 mg/dL — ABNORMAL LOW (ref 8.9–10.3)
Chloride: 104 mmol/L (ref 98–111)
Creatinine, Ser: 0.79 mg/dL (ref 0.44–1.00)
GFR, Estimated: >60 mL/min (ref >60)
Glucose, Bld: 86 mg/dL (ref 70–99)
Potassium: 3.5 mmol/L (ref 3.5–5.1)
Sodium: 137 mmol/L (ref 135–145)

## 2023-06-08 LAB — PREPARE RBC (CROSSMATCH)

## 2023-06-08 LAB — TYPE AND SCREEN
ABO/RH(D): O POS
Antibody Screen: NEGATIVE

## 2023-06-08 LAB — SURGICAL PCR SCREEN
MRSA, PCR: NEGATIVE
Staphylococcus aureus: NEGATIVE

## 2023-06-15 ENCOUNTER — Other Ambulatory Visit: Payer: Self-pay | Admitting: Orthopedic Surgery

## 2023-06-15 ENCOUNTER — Telehealth: Payer: Self-pay | Admitting: Radiology

## 2023-06-15 DIAGNOSIS — M1711 Unilateral primary osteoarthritis, right knee: Secondary | ICD-10-CM

## 2023-06-15 NOTE — Telephone Encounter (Signed)
Left message for patient to make sure she has a walker Sent order for PT outpatient and home Sent order for CPM  These are normally done in advance, I asked for help but message not received. Surgery tomorrow  FYI only, these may be delayed.

## 2023-06-15 NOTE — H&P (Signed)
H/P  Patient: Wendy Newman                                        Date of Birth: 14-Jul-1941                                                 MRN: [id number] Visit Date: 05/21/2023 Requested by: Donita Brooks, MD [address] [place],  [place] [address] PCP: Donita Brooks, MD           Chief Complaint  Patient presents with   Knee Pain      Right/ states pain for long duration couple years, not better with injections and also has pain right lower leg when bending over           Wendy Newman is 82 year old female history of DVT left leg comes in for right knee pain with osteoarthritis failing injection physical therapy and anti-inflammatories   She is no longer on Eliquis status post blood clot in her left leg back in 12/22   She has hypertension osteoporosis   She is ready to get something done to her right knee because of disabling knee pain   She does not want to proceed with hyaluronic injections because she will eventually wind up knee replacement anyway       Body mass index is 28.25 kg/m.     Problem list, medical hx, medications and allergies reviewed    Review of Systems  All other systems reviewed and are negative.   Family History  Problem Relation Age of Onset   Cancer Mother 47       Breast. age 39.    Colon polyps Mother    Heart disease Father 76       CABG   Cancer Sister 38       Lung   Cancer Brother 52       Started in the back   Colon cancer Other        Age 71s. maternal grandmother      Past Medical History:  Diagnosis Date   Anxiety    Cataract    DVT (deep venous thrombosis) (HCC)    Edema of both legs    Essential hypertension    Osteoporosis    PONV (postoperative nausea and vomiting)    Vitamin D deficiency    Past Surgical History:  Procedure Laterality Date   ABDOMINAL HYSTERECTOMY     Complete   CATARACT EXTRACTION Bilateral    CESAREAN SECTION     X 3   COLONOSCOPY  10/10/2008   [id number]    COLONOSCOPY WITH PROPOFOL N/A 12/21/2014   Dr. Rourk:noncompliant left colon and redundant right colon, rectal and colonic mucosa appeared normal.    Problem list, medical hx, medications and allergies reviewed     Allergies  No Known Allergies     BP (!) 179/73   Pulse (!) 59   Ht 5\' 6"  (1.676 m)   Wt 175 lb (79.4 kg)   BMI 28.25 kg/m      Physical exam: Physical Exam Vitals and nursing note reviewed.  Constitutional:      General: She is not in acute distress.    Appearance: Normal appearance. She  is not ill-appearing, toxic-appearing or diaphoretic.  HENT:     Head: Normocephalic and atraumatic.     Nose: Nose normal. No congestion or rhinorrhea.  Eyes:     General: No scleral icterus.       Right eye: No discharge.        Left eye: No discharge.     Extraocular Movements: Extraocular movements intact.     Conjunctiva/sclera: Conjunctivae normal.     Pupils: Pupils are equal, round, and reactive to light.  Cardiovascular:     Pulses: Normal pulses.  Pulmonary:     Effort: Pulmonary effort is normal.     Breath sounds: No wheezing.  Musculoskeletal:     Right knee: Bony tenderness present. No swelling, deformity, effusion, erythema, ecchymosis or lacerations. Decreased range of motion. Tenderness present over the medial joint line and patellar tendon. No LCL laxity, MCL laxity, ACL laxity or PCL laxity. Abnormal alignment.     Instability Tests: Anterior drawer test negative. Posterior drawer test negative.     Right lower leg: Swelling present. Pitting Edema present.     Left lower leg: Swelling present. Pitting Edema present.  Skin:    General: Skin is warm and dry.     Capillary Refill: Capillary refill takes less than 2 seconds.     Coloration: Skin is not jaundiced.     Findings: No erythema.  Neurological:     General: No focal deficit present.     Mental Status: She is alert and oriented to person, place, and time.     Gait: Gait abnormal.  Psychiatric:         Mood and Affect: Mood normal.        Behavior: Behavior normal.        Thought Content: Thought content normal.        Judgment: Judgment normal.      Data reviewed:    Imaging studies show grade 3 arthritis without osteophytes with valgus alignment of the right knee there is increased valgus alignment to the joint   She also had an MRI of the knee which showed degeneration and free edge fraying of the lateral meniscus without tear mild 3 compartment arthritis with full-thickness cartilage loss on the lateral femoral condyle   Assessment and plan:       Encounter Diagnoses  Name Primary?   Unilateral primary osteoarthritis, right knee Yes   Pre-op exam        The procedure has been fully reviewed with the patient; The risks and benefits of surgery have been discussed and explained and understood. Alternative treatment has also been reviewed, questions were encouraged and answered. The postoperative plan is also been reviewed.   End-stage arthritis right knee patient opts for surgical intervention   Risk and benefits explained to the patient and she agrees to proceed with surgery for a right total knee arthroplasty   We specifically discussed bleeding infection blood clots and nerve stretching due to the valgus knee   The patient has no steps to get in the house.  She will get someone to stay with her for week.  She is expected to go home the next day.

## 2023-06-15 NOTE — Telephone Encounter (Signed)
The home therapy will be delayed, it will start Thursday this week instead of Wednesday. Wendy Newman has the orders and will make sure she is taken care of  Again, FYI only.

## 2023-06-16 ENCOUNTER — Encounter (HOSPITAL_COMMUNITY)
Admission: RE | Disposition: A | Discharge status: Home Health | Payer: Self-pay | Source: Home / Self Care | Attending: Orthopedic Surgery

## 2023-06-16 ENCOUNTER — Inpatient Hospital Stay (HOSPITAL_COMMUNITY)
Admission: RE | Admit: 2023-06-16 | Discharge: 2023-06-18 | DRG: 983 | Disposition: A | Discharge status: Home Health | Payer: Medicare PPO | Attending: Orthopedic Surgery | Admitting: Orthopedic Surgery

## 2023-06-16 ENCOUNTER — Ambulatory Visit (HOSPITAL_COMMUNITY): Payer: Medicare PPO | Admitting: Certified Registered Nurse Anesthetist

## 2023-06-16 ENCOUNTER — Encounter (HOSPITAL_COMMUNITY): Payer: Self-pay | Admitting: Orthopedic Surgery

## 2023-06-16 ENCOUNTER — Ambulatory Visit (HOSPITAL_BASED_OUTPATIENT_CLINIC_OR_DEPARTMENT_OTHER): Payer: Medicare PPO | Admitting: Certified Registered Nurse Anesthetist

## 2023-06-16 ENCOUNTER — Other Ambulatory Visit: Payer: Self-pay

## 2023-06-16 ENCOUNTER — Ambulatory Visit (HOSPITAL_COMMUNITY): Payer: Medicare PPO

## 2023-06-16 DIAGNOSIS — R112 Nausea with vomiting, unspecified: Secondary | ICD-10-CM | POA: Diagnosis present

## 2023-06-16 DIAGNOSIS — Y848 Other medical procedures as the cause of abnormal reaction of the patient, or of later complication, without mention of misadventure at the time of the procedure: Secondary | ICD-10-CM | POA: Diagnosis present

## 2023-06-16 DIAGNOSIS — Z800 Family history of malignant neoplasm of digestive organs: Secondary | ICD-10-CM | POA: Diagnosis not present

## 2023-06-16 DIAGNOSIS — Z96651 Presence of right artificial knee joint: Secondary | ICD-10-CM | POA: Diagnosis not present

## 2023-06-16 DIAGNOSIS — M810 Age-related osteoporosis without current pathological fracture: Secondary | ICD-10-CM | POA: Diagnosis present

## 2023-06-16 DIAGNOSIS — I10 Essential (primary) hypertension: Secondary | ICD-10-CM | POA: Diagnosis present

## 2023-06-16 DIAGNOSIS — Z801 Family history of malignant neoplasm of trachea, bronchus and lung: Secondary | ICD-10-CM | POA: Diagnosis not present

## 2023-06-16 DIAGNOSIS — Z90710 Acquired absence of both cervix and uterus: Secondary | ICD-10-CM | POA: Diagnosis not present

## 2023-06-16 DIAGNOSIS — Z86718 Personal history of other venous thrombosis and embolism: Secondary | ICD-10-CM

## 2023-06-16 DIAGNOSIS — M1711 Unilateral primary osteoarthritis, right knee: Secondary | ICD-10-CM | POA: Diagnosis present

## 2023-06-16 DIAGNOSIS — K9189 Other postprocedural complications and disorders of digestive system: Principal | ICD-10-CM | POA: Diagnosis present

## 2023-06-16 DIAGNOSIS — R609 Edema, unspecified: Secondary | ICD-10-CM | POA: Diagnosis not present

## 2023-06-16 DIAGNOSIS — S92535A Nondisplaced fracture of distal phalanx of left lesser toe(s), initial encounter for closed fracture: Secondary | ICD-10-CM

## 2023-06-16 DIAGNOSIS — Z803 Family history of malignant neoplasm of breast: Secondary | ICD-10-CM

## 2023-06-16 DIAGNOSIS — Z8249 Family history of ischemic heart disease and other diseases of the circulatory system: Secondary | ICD-10-CM | POA: Diagnosis not present

## 2023-06-16 HISTORY — PX: TOTAL KNEE ARTHROPLASTY: SHX125

## 2023-06-16 LAB — ABO/RH: ABO/RH(D): O POS

## 2023-06-16 SURGERY — ARTHROPLASTY, KNEE, TOTAL
Anesthesia: Spinal | Site: Knee | Laterality: Right

## 2023-06-16 MED ORDER — SODIUM CHLORIDE 0.9 % IV SOLN: INTRAVENOUS | Status: AC

## 2023-06-16 MED ORDER — FLUTICASONE PROPIONATE 50 MCG/ACT NA SUSP
2.0000 | Freq: Every day | NASAL | Status: DC
  Administered 2023-06-17 – 2023-06-18 (×2): 2 via NASAL
  Filled 2023-06-16: qty 16

## 2023-06-16 MED ORDER — HYDROCODONE-ACETAMINOPHEN 7.5-325 MG PO TABS
1.0000 | ORAL_TABLET | ORAL | Status: DC | PRN
  Administered 2023-06-17: 2 via ORAL
  Administered 2023-06-18: 1 via ORAL
  Filled 2023-06-16: qty 2
  Filled 2023-06-16: qty 1

## 2023-06-16 MED ORDER — PHENYLEPHRINE HCL-NACL 20-0.9 MG/250ML-% IV SOLN
INTRAVENOUS | Status: DC | PRN
Start: 2023-06-16 — End: 2023-06-16
  Administered 2023-06-16: 50 ug/min via INTRAVENOUS

## 2023-06-16 MED ORDER — TRANEXAMIC ACID-NACL 1000-0.7 MG/100ML-% IV SOLN
1000.0000 mg | Freq: Once | INTRAVENOUS | Status: AC
  Administered 2023-06-16: 1000 mg via INTRAVENOUS
  Filled 2023-06-16: qty 100

## 2023-06-16 MED ORDER — CALCIUM-MAGNESIUM-ZINC-VIT D3 84-24-0.7-10 MG-MCG/5ML PO LIQD: Freq: Every day | ORAL | Status: DC

## 2023-06-16 MED ORDER — METHOCARBAMOL 500 MG PO TABS
500.0000 mg | ORAL_TABLET | Freq: Four times a day (QID) | ORAL | Status: DC | PRN
  Administered 2023-06-17: 500 mg via ORAL
  Filled 2023-06-16: qty 1

## 2023-06-16 MED ORDER — PROPOFOL 500 MG/50ML IV EMUL
INTRAVENOUS | Status: AC
  Filled 2023-06-16: qty 100

## 2023-06-16 MED ORDER — ONDANSETRON HCL 4 MG/2ML IJ SOLN: 4.0000 mg | Freq: Once | INTRAMUSCULAR | Status: DC | PRN

## 2023-06-16 MED ORDER — OXYCODONE HCL 5 MG/5ML PO SOLN: 5.0000 mg | Freq: Once | ORAL | Status: AC | PRN

## 2023-06-16 MED ORDER — MIDAZOLAM HCL 5 MG/5ML IJ SOLN
INTRAMUSCULAR | Status: DC | PRN
  Administered 2023-06-16: 1 mg via INTRAVENOUS

## 2023-06-16 MED ORDER — MENTHOL 3 MG MT LOZG: 1.0000 | LOZENGE | OROMUCOSAL | Status: DC | PRN

## 2023-06-16 MED ORDER — OXYCODONE HCL 5 MG PO TABS
5.0000 mg | ORAL_TABLET | Freq: Once | ORAL | Status: AC
  Administered 2023-06-16: 5 mg via ORAL
  Filled 2023-06-16: qty 1

## 2023-06-16 MED ORDER — METHOCARBAMOL 1000 MG/10ML IJ SOLN: 500.0000 mg | Freq: Four times a day (QID) | INTRAVENOUS | Status: DC | PRN

## 2023-06-16 MED ORDER — PROPOFOL 500 MG/50ML IV EMUL
INTRAVENOUS | Status: DC | PRN
Start: 2023-06-16 — End: 2023-06-16
  Administered 2023-06-16: 80 ug/kg/min via INTRAVENOUS
  Administered 2023-06-16: 30 mg via INTRAVENOUS

## 2023-06-16 MED ORDER — CITALOPRAM HYDROBROMIDE 20 MG PO TABS
20.0000 mg | ORAL_TABLET | Freq: Every day | ORAL | Status: DC
  Administered 2023-06-16 – 2023-06-18 (×3): 20 mg via ORAL
  Filled 2023-06-16 (×3): qty 1

## 2023-06-16 MED ORDER — METHOCARBAMOL 1000 MG/10ML IJ SOLN
500.0000 mg | Freq: Once | INTRAVENOUS | Status: AC
  Administered 2023-06-16: 500 mg via INTRAVENOUS
  Filled 2023-06-16: qty 5

## 2023-06-16 MED ORDER — LACTATED RINGERS IV SOLN: INTRAVENOUS | Status: DC | PRN

## 2023-06-16 MED ORDER — DIPHENHYDRAMINE HCL 12.5 MG/5ML PO ELIX: 12.5000 mg | ORAL_SOLUTION | ORAL | Status: DC | PRN

## 2023-06-16 MED ORDER — FENTANYL CITRATE PF 50 MCG/ML IJ SOSY
25.0000 ug | PREFILLED_SYRINGE | INTRAMUSCULAR | Status: DC | PRN
  Administered 2023-06-16: 25 ug via INTRAVENOUS
  Filled 2023-06-16: qty 1

## 2023-06-16 MED ORDER — TRAMADOL HCL 50 MG PO TABS
50.0000 mg | ORAL_TABLET | Freq: Four times a day (QID) | ORAL | Status: DC
  Administered 2023-06-16 – 2023-06-18 (×8): 50 mg via ORAL
  Filled 2023-06-16 (×8): qty 1

## 2023-06-16 MED ORDER — OXYCODONE HCL 5 MG PO TABS
5.0000 mg | ORAL_TABLET | Freq: Once | ORAL | Status: AC | PRN
  Administered 2023-06-16: 5 mg via ORAL
  Filled 2023-06-16: qty 1

## 2023-06-16 MED ORDER — PHENOL 1.4 % MT LIQD: 1.0000 | OROMUCOSAL | Status: DC | PRN

## 2023-06-16 MED ORDER — POLYETHYLENE GLYCOL 3350 17 G PO PACK: 17.0000 g | PACK | Freq: Every day | ORAL | Status: DC | PRN

## 2023-06-16 MED ORDER — POVIDONE-IODINE 10 % EX SWAB
2.0000 | Freq: Once | CUTANEOUS | Status: AC
  Administered 2023-06-16: 2 via TOPICAL

## 2023-06-16 MED ORDER — LACTATED RINGERS IV SOLN: INTRAVENOUS | Status: DC

## 2023-06-16 MED ORDER — ACETAMINOPHEN 325 MG PO TABS
325.0000 mg | ORAL_TABLET | Freq: Four times a day (QID) | ORAL | Status: DC | PRN
  Filled 2023-06-16: qty 2

## 2023-06-16 MED ORDER — ALUM & MAG HYDROXIDE-SIMETH 200-200-20 MG/5ML PO SUSP: 30.0000 mL | ORAL | Status: DC | PRN

## 2023-06-16 MED ORDER — LORATADINE 10 MG PO TABS
10.0000 mg | ORAL_TABLET | Freq: Every day | ORAL | Status: DC
  Administered 2023-06-17 – 2023-06-18 (×2): 10 mg via ORAL
  Filled 2023-06-16 (×2): qty 1

## 2023-06-16 MED ORDER — METOCLOPRAMIDE HCL 10 MG PO TABS: 5.0000 mg | ORAL_TABLET | Freq: Three times a day (TID) | ORAL | Status: DC | PRN

## 2023-06-16 MED ORDER — CEFAZOLIN SODIUM-DEXTROSE 2-4 GM/100ML-% IV SOLN
2.0000 g | INTRAVENOUS | Status: AC
  Administered 2023-06-16: 2 g via INTRAVENOUS
  Filled 2023-06-16: qty 100

## 2023-06-16 MED ORDER — BUPIVACAINE-MELOXICAM ER 200-6 MG/7ML IJ SOLN
INTRAMUSCULAR | Status: DC | PRN
  Administered 2023-06-16: 400 mg

## 2023-06-16 MED ORDER — DEXAMETHASONE SODIUM PHOSPHATE 10 MG/ML IJ SOLN
INTRAMUSCULAR | Status: DC | PRN
  Administered 2023-06-16: 10 mg via INTRAVENOUS

## 2023-06-16 MED ORDER — MIDAZOLAM HCL 2 MG/2ML IJ SOLN
INTRAMUSCULAR | Status: AC
  Filled 2023-06-16: qty 2

## 2023-06-16 MED ORDER — PANTOPRAZOLE SODIUM 40 MG PO TBEC
40.0000 mg | DELAYED_RELEASE_TABLET | Freq: Every day | ORAL | Status: DC
  Administered 2023-06-16 – 2023-06-18 (×3): 40 mg via ORAL
  Filled 2023-06-16 (×3): qty 1

## 2023-06-16 MED ORDER — ONDANSETRON HCL 4 MG/2ML IJ SOLN
INTRAMUSCULAR | Status: DC | PRN
  Administered 2023-06-16: 4 mg via INTRAVENOUS

## 2023-06-16 MED ORDER — DOCUSATE SODIUM 100 MG PO CAPS
100.0000 mg | ORAL_CAPSULE | Freq: Two times a day (BID) | ORAL | Status: DC
  Administered 2023-06-16 – 2023-06-18 (×4): 100 mg via ORAL
  Filled 2023-06-16 (×4): qty 1

## 2023-06-16 MED ORDER — SODIUM CHLORIDE 0.9 % IR SOLN
Status: DC | PRN
  Administered 2023-06-16: 3000 mL

## 2023-06-16 MED ORDER — FENTANYL CITRATE (PF) 100 MCG/2ML IJ SOLN
INTRAMUSCULAR | Status: AC
  Filled 2023-06-16: qty 2

## 2023-06-16 MED ORDER — 0.9 % SODIUM CHLORIDE (POUR BTL) OPTIME
TOPICAL | Status: DC | PRN
  Administered 2023-06-16: 1000 mL

## 2023-06-16 MED ORDER — ONDANSETRON HCL 4 MG PO TABS: 4.0000 mg | ORAL_TABLET | Freq: Four times a day (QID) | ORAL | Status: DC | PRN

## 2023-06-16 MED ORDER — TRANEXAMIC ACID-NACL 1000-0.7 MG/100ML-% IV SOLN
1000.0000 mg | INTRAVENOUS | Status: AC
  Administered 2023-06-16: 1000 mg via INTRAVENOUS
  Filled 2023-06-16: qty 100

## 2023-06-16 MED ORDER — ATORVASTATIN CALCIUM 20 MG PO TABS
20.0000 mg | ORAL_TABLET | Freq: Every day | ORAL | Status: DC
  Administered 2023-06-16 – 2023-06-18 (×3): 20 mg via ORAL
  Filled 2023-06-16 (×3): qty 1

## 2023-06-16 MED ORDER — MORPHINE SULFATE (PF) 2 MG/ML IV SOLN
0.5000 mg | INTRAVENOUS | Status: DC | PRN
  Administered 2023-06-16 – 2023-06-17 (×4): 1 mg via INTRAVENOUS
  Filled 2023-06-16 (×4): qty 1

## 2023-06-16 MED ORDER — HYDROCHLOROTHIAZIDE 25 MG PO TABS
25.0000 mg | ORAL_TABLET | Freq: Every day | ORAL | Status: DC
  Administered 2023-06-16 – 2023-06-18 (×3): 25 mg via ORAL
  Filled 2023-06-16 (×3): qty 1

## 2023-06-16 MED ORDER — LEVOCETIRIZINE DIHYDROCHLORIDE 5 MG PO TABS: 5.0000 mg | ORAL_TABLET | Freq: Every evening | ORAL | Status: DC

## 2023-06-16 MED ORDER — ASPIRIN 81 MG PO CHEW
81.0000 mg | CHEWABLE_TABLET | Freq: Two times a day (BID) | ORAL | Status: DC
  Administered 2023-06-16 – 2023-06-18 (×4): 81 mg via ORAL
  Filled 2023-06-16 (×4): qty 1

## 2023-06-16 MED ORDER — CEFAZOLIN SODIUM-DEXTROSE 2-4 GM/100ML-% IV SOLN
2.0000 g | Freq: Four times a day (QID) | INTRAVENOUS | Status: AC
  Administered 2023-06-16 (×2): 2 g via INTRAVENOUS
  Filled 2023-06-16 (×2): qty 100

## 2023-06-16 MED ORDER — KETAMINE HCL 50 MG/5ML IJ SOSY
PREFILLED_SYRINGE | INTRAMUSCULAR | Status: AC
  Filled 2023-06-16: qty 5

## 2023-06-16 MED ORDER — HYDROCODONE-ACETAMINOPHEN 5-325 MG PO TABS
1.0000 | ORAL_TABLET | ORAL | Status: DC | PRN
  Administered 2023-06-16: 2 via ORAL
  Administered 2023-06-17: 1 via ORAL
  Filled 2023-06-16: qty 1
  Filled 2023-06-16: qty 2

## 2023-06-16 MED ORDER — PREGABALIN 50 MG PO CAPS
50.0000 mg | ORAL_CAPSULE | Freq: Once | ORAL | Status: AC
  Administered 2023-06-16: 50 mg via ORAL
  Filled 2023-06-16: qty 1

## 2023-06-16 MED ORDER — METOCLOPRAMIDE HCL 5 MG/ML IJ SOLN
5.0000 mg | Freq: Three times a day (TID) | INTRAMUSCULAR | Status: DC | PRN
  Administered 2023-06-17: 10 mg via INTRAVENOUS
  Filled 2023-06-16: qty 2

## 2023-06-16 MED ORDER — BUPIVACAINE-MELOXICAM ER 200-6 MG/7ML IJ SOLN
INTRAMUSCULAR | Status: AC
  Filled 2023-06-16: qty 2

## 2023-06-16 MED ORDER — ONDANSETRON HCL 4 MG/2ML IJ SOLN
4.0000 mg | Freq: Once | INTRAMUSCULAR | Status: AC
  Administered 2023-06-16: 4 mg via INTRAVENOUS
  Filled 2023-06-16: qty 2

## 2023-06-16 MED ORDER — GLYCOPYRROLATE 0.2 MG/ML IJ SOLN
INTRAMUSCULAR | Status: DC | PRN
  Administered 2023-06-16: .2 mg via INTRAVENOUS

## 2023-06-16 MED ORDER — ONDANSETRON HCL 4 MG/2ML IJ SOLN
4.0000 mg | Freq: Four times a day (QID) | INTRAMUSCULAR | Status: DC | PRN
  Administered 2023-06-16: 4 mg via INTRAVENOUS
  Filled 2023-06-16: qty 2

## 2023-06-16 MED ORDER — CHLORHEXIDINE GLUCONATE 0.12 % MT SOLN
15.0000 mL | Freq: Once | OROMUCOSAL | Status: AC
  Administered 2023-06-16: 15 mL via OROMUCOSAL

## 2023-06-16 MED ORDER — CELECOXIB 400 MG PO CAPS
400.0000 mg | ORAL_CAPSULE | Freq: Once | ORAL | Status: AC
  Administered 2023-06-16: 400 mg via ORAL
  Filled 2023-06-16: qty 1

## 2023-06-16 MED ORDER — KETAMINE HCL 10 MG/ML IJ SOLN
INTRAMUSCULAR | Status: DC | PRN
  Administered 2023-06-16: 3.9700000000000006 mg via INTRAVENOUS
  Administered 2023-06-16: 6.352 mg via INTRAVENOUS
  Administered 2023-06-16 (×2): 3.573 mg via INTRAVENOUS
  Administered 2023-06-16: 5.955 mg via INTRAVENOUS
  Administered 2023-06-16: 6.352 mg via INTRAVENOUS
  Administered 2023-06-16: 3.9700000000000006 mg via INTRAVENOUS
  Administered 2023-06-16: 5.955 mg via INTRAVENOUS

## 2023-06-16 MED ORDER — DEXAMETHASONE SODIUM PHOSPHATE 10 MG/ML IJ SOLN
10.0000 mg | Freq: Once | INTRAMUSCULAR | Status: AC
  Administered 2023-06-17: 10 mg via INTRAVENOUS
  Filled 2023-06-16: qty 1

## 2023-06-16 MED ORDER — ACETAMINOPHEN 10 MG/ML IV SOLN
INTRAVENOUS | Status: AC
  Filled 2023-06-16: qty 100

## 2023-06-16 MED ORDER — ORAL CARE MOUTH RINSE: 15.0000 mL | Freq: Once | OROMUCOSAL | Status: AC

## 2023-06-16 MED ORDER — ROPIVACAINE HCL 5 MG/ML IJ SOLN
INTRAMUSCULAR | Status: AC
  Filled 2023-06-16: qty 30

## 2023-06-16 MED ORDER — BISACODYL 5 MG PO TBEC: 5.0000 mg | DELAYED_RELEASE_TABLET | Freq: Every day | ORAL | Status: DC | PRN

## 2023-06-16 SURGICAL SUPPLY — 60 items
ATTUNE PSFEM RTSZ5 NARCEM KNEE (Femur) IMPLANT
BANDAGE ESMARK 6X9 LF (GAUZE/BANDAGES/DRESSINGS) ×1 IMPLANT
BASEPLATE TIB CMT FB PCKT SZ4 (Stem) IMPLANT
BLADE SAGITTAL 25.0X1.27X90 (BLADE) ×1 IMPLANT
BLADE SAW SGTL 11.0X1.19X90.0M (BLADE) ×1 IMPLANT
BLADE SURG SZ10 CARB STEEL (BLADE) ×1 IMPLANT
BNDG CMPR 9X6 STRL LF SNTH (GAUZE/BANDAGES/DRESSINGS) ×1
BNDG ESMARK 6X9 LF (GAUZE/BANDAGES/DRESSINGS) ×1
BSPLAT TIB 4 CMNT FX BRNG STRL (Stem) ×1 IMPLANT
CEMENT HV SMART SET (Cement) ×2 IMPLANT
CLOTH BEACON ORANGE TIMEOUT ST (SAFETY) ×1 IMPLANT
COOLER ICEMAN CLASSIC (MISCELLANEOUS) ×1 IMPLANT
COVER LIGHT HANDLE STERIS (MISCELLANEOUS) ×2 IMPLANT
CUFF TOURN SGL QUICK 34 (TOURNIQUET CUFF) ×1
CUFF TRNQT CYL 34X4.125X (TOURNIQUET CUFF) ×1 IMPLANT
DRAPE BACK TABLE (DRAPES) ×1 IMPLANT
DRAPE EXTREMITY T 121X128X90 (DISPOSABLE) ×1 IMPLANT
DRESSING AQUACEL AG ADV 3.5X12 (MISCELLANEOUS) ×1 IMPLANT
DRSG AQUACEL AG ADV 3.5X12 (MISCELLANEOUS) ×1
DURAPREP 26ML APPLICATOR (WOUND CARE) ×2 IMPLANT
ELECT REM PT RETURN 9FT ADLT (ELECTROSURGICAL) ×1
ELECTRODE REM PT RTRN 9FT ADLT (ELECTROSURGICAL) ×1 IMPLANT
GLOVE BIO SURGEON STRL SZ7 (GLOVE) IMPLANT
GLOVE BIOGEL PI IND STRL 7.0 (GLOVE) ×3 IMPLANT
GLOVE BIOGEL PI IND STRL 8.5 (GLOVE) ×1 IMPLANT
GLOVE ECLIPSE 6.5 STRL STRAW (GLOVE) IMPLANT
GLOVE SKINSENSE STRL SZ8.0 LF (GLOVE) ×1 IMPLANT
GOWN STRL REUS W/TWL LRG LVL3 (GOWN DISPOSABLE) ×3 IMPLANT
GOWN STRL REUS W/TWL XL LVL3 (GOWN DISPOSABLE) ×1 IMPLANT
HANDPIECE INTERPULSE COAX TIP (DISPOSABLE) ×1
HOOD W/PEELAWAY (MISCELLANEOUS) ×4 IMPLANT
INSERT TIB FIX BEARNG SZ 5 5MM (Insert) IMPLANT
INST SET MAJOR BONE (KITS) ×1 IMPLANT
IV NS IRRIG 3000ML ARTHROMATIC (IV SOLUTION) ×1 IMPLANT
KIT BLADEGUARD II DBL (SET/KITS/TRAYS/PACK) ×1 IMPLANT
KIT TURNOVER KIT A (KITS) ×1 IMPLANT
MANIFOLD NEPTUNE II (INSTRUMENTS) ×1 IMPLANT
MARKER SKIN DUAL TIP RULER LAB (MISCELLANEOUS) ×1 IMPLANT
NS IRRIG 1000ML POUR BTL (IV SOLUTION) ×1 IMPLANT
PACK TOTAL JOINT (CUSTOM PROCEDURE TRAY) ×1 IMPLANT
PAD ARMBOARD 7.5X6 YLW CONV (MISCELLANEOUS) ×1 IMPLANT
PAD COLD SHLDR SM WRAP-ON (PAD) ×1 IMPLANT
PATELLA MEDIAL ATTUN 35MM KNEE (Knees) IMPLANT
PILLOW KNEE EXTENSION 0 DEG (MISCELLANEOUS) ×1 IMPLANT
PIN/DRILL PACK ORTHO 1/8X3.0 (PIN) ×1 IMPLANT
POSITIONER HEAD 8X9X4 ADT (SOFTGOODS) ×1 IMPLANT
SAW OSC TIP CART 19.5X105X1.3 (SAW) ×1 IMPLANT
SET BASIN LINEN APH (SET/KITS/TRAYS/PACK) ×1 IMPLANT
SET HNDPC FAN SPRY TIP SCT (DISPOSABLE) ×1 IMPLANT
SOLUTION IRRIG SURGIPHOR (IV SOLUTION) ×1 IMPLANT
STAPLER VISISTAT 35W (STAPLE) ×1 IMPLANT
SUT BRALON NAB BRD #1 30IN (SUTURE) ×1 IMPLANT
SUT MNCRL 0 VIOLET CTX 36 (SUTURE) ×1 IMPLANT
SUT MON AB 0 CT1 (SUTURE) ×1 IMPLANT
SYR BULB IRRIG 60ML STRL (SYRINGE) ×1 IMPLANT
TOWEL OR 17X26 4PK STRL BLUE (TOWEL DISPOSABLE) ×1 IMPLANT
TOWER CARTRIDGE SMART MIX (DISPOSABLE) ×1 IMPLANT
TRAY FOLEY MTR SLVR 16FR STAT (SET/KITS/TRAYS/PACK) ×1 IMPLANT
WATER STERILE IRR 1000ML POUR (IV SOLUTION) ×2 IMPLANT
YANKAUER SUCT 12FT TUBE ARGYLE (SUCTIONS) ×1 IMPLANT

## 2023-06-16 NOTE — Evaluation (Signed)
Physical Therapy Evaluation Patient Details Name: Wendy Newman MRN: [id number] DOB: [birth year] Today's Date: 06/16/2023  History of Present Illness  Wendy Newman is 82 year old female history of DVT left leg comes in for right knee pain with osteoarthritis failing injection physical therapy and anti-inflammatories>  TKR on 06/16/23  Clinical Impression  PT lives by herself but will have assistance from her 42 yo granddaughter until Monday when school begins.         If plan is discharge home, recommend the following: A little help with walking and/or transfers;A little help with bathing/dressing/bathroom;Assistance with cooking/housework;Assist for transportation   Can travel by private vehicle    yes    Equipment Recommendations BSC/3in1  Recommendations for Other Services    OT   Functional Status Assessment Patient has had a recent decline in their functional status and demonstrates the ability to make significant improvements in function in a reasonable and predictable amount of time.     Precautions / Restrictions Precautions Precautions: None Restrictions Weight Bearing Restrictions: No RLE Weight Bearing: Weight bearing as tolerated LLE Weight Bearing: Weight bearing as tolerated      Mobility  Bed Mobility Overal bed mobility: Needs Assistance Bed Mobility: Supine to Sit, Sit to Supine     Supine to sit: Min assist Sit to supine: Mod assist   General bed mobility comments: Needs assist with Rt LE    Transfers Overall transfer level: Needs assistance   Transfers: Sit to/from Stand Sit to Stand: Min assist                Ambulation/Gait Ambulation/Gait assistance: Contact guard assist Gait Distance (Feet): 2 Feet Assistive device: Rolling walker (2 wheels) Gait Pattern/deviations: Decreased stance time - right, Decreased step length - left       General Gait Details: PT became nauseated and then vomitted.         Pertinent Vitals/Pain Pain  Assessment Pain Assessment: 0-10 Pain Score: 8  Pain Location: Rt knee Pain Descriptors / Indicators: Aching Pain Intervention(s): Limited activity within patient's tolerance (Pt states nurse has just given pt pain meds)    Home Living Family/patient expects to be discharged to:: Private residence Living Arrangements: Alone Available Help at Discharge: Family;Available 24 hours/day Type of Home: Apartment Home Access: Level entry       Home Layout: One level Home Equipment: Agricultural consultant (2 wheels)      Prior Function Prior Level of Function : Independent/Modified Independent                     Extremity/Trunk Assessment        Lower Extremity Assessment Lower Extremity Assessment: RLE deficits/detail RLE: Unable to fully assess due to pain       Communication   Communication Cueing Techniques: Verbal cues  Cognition Arousal: Alert Behavior During Therapy: WFL for tasks assessed/performed Overall Cognitive Status: Within Functional Limits for tasks assessed                                             Exercises Total Joint Exercises Ankle Circles/Pumps: Both, 10 reps Quad Sets: Both, 10 reps Heel Slides: Right, 5 reps   Assessment/Plan    PT Assessment Patient needs continued PT services  PT Problem List Decreased strength;Decreased range of motion;Decreased activity tolerance;Decreased mobility;Pain       PT Treatment Interventions Gait training;Therapeutic  exercise    PT Goals (Current goals can be found in the Care Plan section)  Acute Rehab PT Goals Patient Stated Goal: to walk better. Time For Goal Achievement: 06/18/23 Potential to Achieve Goals: Good    Frequency Min 5X/week        AM-PAC PT "6 Clicks" Mobility  Outcome Measure Help needed turning from your back to your side while in a flat bed without using bedrails?: A Little Help needed moving from lying on your back to sitting on the side of a flat bed  without using bedrails?: A Little Help needed moving to and from a bed to a chair (including a wheelchair)?: A Little Help needed standing up from a chair using your arms (e.g., wheelchair or bedside chair)?: A Little Help needed to walk in hospital room?: A Little Help needed climbing 3-5 steps with a railing? : A Lot 6 Click Score: 17    End of Session Equipment Utilized During Treatment: Gait belt Activity Tolerance: Patient limited by pain Patient left: in bed;with call bell/phone within reach;with bed alarm set Nurse Communication: Mobility status PT Visit Diagnosis: Unsteadiness on feet (R26.81);Muscle weakness (generalized) (M62.81);Pain Pain - Right/Left: Right Pain - part of body: Knee    Time: 9323-5573 PT Time Calculation (min) (ACUTE ONLY): 29 min   Charges:   PT Evaluation $PT Eval Low Complexity: 1 Low PT Treatments $Therapeutic Activity: 8-22 mins PT General Charges $$ ACUTE PT VISIT: 1 Visit         Virgina Organ, PT CLT [id number]  06/16/2023, 4:29 PM

## 2023-06-16 NOTE — Brief Op Note (Signed)
06/16/2023  9:55 AM  PATIENT:  Wendy Newman  82 y.o. female  PRE-OPERATIVE DIAGNOSIS:  right knee osteoarthritis  POST-OPERATIVE DIAGNOSIS:  right knee osteoarthritis  PROCEDURE:  Procedure(s): TOTAL KNEE ARTHROPLASTY (Right)  SURGEON:  Surgeons and Role:    Vickki Hearing, MD - Primary  PHYSICIAN ASSISTANT:   ASSISTANTS: cynthia and nicki   ANESTHESIA:   spinal  EBL:  25 mL   BLOOD ADMINISTERED:none  DRAINS: none   LOCAL MEDICATIONS USED:  OTHER zinrelef  SPECIMEN:  No Specimen  DISPOSITION OF SPECIMEN:  N/A  COUNTS:  YES  TOURNIQUET:   Total Tourniquet Time Documented: Thigh (Right) - 82 minutes Total: Thigh (Right) - 82 minutes   DICTATION: .Reubin Milan Dictation  PLAN OF CARE: Admit for overnight observation  PATIENT DISPOSITION:  PACU - hemodynamically stable.   Delay start of Pharmacological VTE agent (>24hrs) due to surgical blood loss or risk of bleeding: yes

## 2023-06-16 NOTE — Interval H&P Note (Signed)
History and Physical Interval Note:  06/16/2023 7:20 AM  Wendy Newman  has presented today for surgery, with the diagnosis of right knee osteoarthritis.  The various methods of treatment have been discussed with the patient and family. After consideration of risks, benefits and other options for treatment, the patient has consented to  Procedure(s): TOTAL KNEE ARTHROPLASTY (Right) as a surgical intervention.  The patient's history has been reviewed, patient examined, no change in status, stable for surgery.  I have reviewed the patient's chart and labs.  Questions were answered to the patient's satisfaction.     Fuller Canada

## 2023-06-16 NOTE — Op Note (Signed)
Dictation for total knee replacement  06/16/2023  9:56 AM  Orthopaedic Surgery Operative Note (CSN: [id number])  Wendy Newman  1941-08-12 Date of Surgery: 06/16/2023   Diagnoses:  right knee osteoarthritis  Procedure: Right Total knee arthroplasty   Operative Finding Lateral femoral condyle gr 3 OA  Lateral tibial osteophytes    Post-Op Diagnosis: Same Surgeons:Primary: Vickki Hearing, MD Assistants: Johnn Hai and nicki harley  Location: [facility] OR ROOM 4 Anesthesia: spinal   Antibiotics: Ancef 2 g Tourniquet time:  Total Tourniquet Time Documented: Thigh (Right) - 82 minutes Total: Thigh (Right) - 82 minutes Estimated Blood Loss: minimal Complications: None Specimens: None  Bone cuts  Distal femur 11 Proximal tibia medial reference 3+2 Patella 21-9.5 Distal femoral angle 4 degrees   Implants: Implant Name Type Inv. Item Serial No. Manufacturer Lot No. LRB No. Used Action  CEMENT HV SMART SET - [id number] Cement CEMENT HV SMART SET  [employer] [id number] Right 2 Implanted  INSERT TIB FIX BEARNG SZ 5 - [id number] Insert INSERT TIB FIX BEARNG SZ 5  DEPU[employer] ORTH[employer] [id number] Right 1 Implanted  PATELLA MEDIAL ATTUN KNEE - [id number] Knees PATELLA MEDIAL ATTUN KNEE  DEPUY ORTH[employer]AEDICS [id number] Right 1 Implanted  ATTUNE PSFEM RTSZ5 NARCEM KNEE - [id number] Femur ATTUNE PSFEM RTSZ5 NARCEM KNEE  [employer]PU[employer] [id number] Right 1 Implanted  BSPLAT TIB 4 CMNT FX BRNG STRL - [id number] Stem BSPLAT TIB 4 CMNT FX BRNG STRL  DEPU[employer]TH[employer] [id number] Right 1 Implanted     Indications for Surgery:   Wendy Newman is a 82 y.o. female presented with disabling right knee pain and was treated nonoperatively eventually succumbing to the disability caused by the knee pain.  Procedure:   She was evaluated and cleared for surgery the right knee was marked as the surgical site the review of the chart and imaging was performed implants were  checked and available   She was taken to the operating room for spinal anesthetic she was in the supine position Foley catheter was inserted.    DuraPrep was used for preparation sterile draping was performed followed by timeout   The limb was exsanguinated with a 6 inch Esmarch tourniquet elevated to 300 mmHg   A straight midline incision was made over the right KNEE and taken down to the extensor mechanism. A medial arthrotomy was performed. The patella was everted and the patellofemoral soft tissue was released, along with the patellar fat pad.  The anterior cruciate ligament and PCL were resected.  The anterior horns of the lateral and medial meniscus were resected. The medial soft tissue sleeve was elevated to the mid coronal plane.  A three-eighths inch drill bit was used to enter the femoral canal which was decompressed with suction and irrigation until clear.   The distal femoral cutting guide was set for 11 mm distal resection,  4valgus alignment, for a right knee. The distal femur was resected and checked for flatness.  The attune sizing femoral guide was placed and the femur was preliminarily sized to a size 5.   The external alignment guide for the tibial resection was then applied to the distal and proximal tibia and set for anatomic slope along with 3 MM resection  from the medial.   Rotational alignment was set using the malleolus, the tibial tubercle and the tibial spines.  The proximal tibia was resected along with  residual menisci. The tibia was sized using a base plate to a size 5.  The extension gap was checked.  I thought it was a little tight and I took an additional 2 mm of proximal tibia.  This allowed the 5 to fit nicely the 6 was a little bit tight   A 4-in-1 cutting block was placed along with collateral ligament retractors.  Our target was proper rotation based on the epicondylar axis using Whitesides line as a guide as well.  Once the block was pinned in  place we took the spacer block that balanced extension gap and placed it on top of the tibia under the femoral block.  I dropped the femoral block down 1-1/2 mm.  Angel wing was used to check there was no evidence of notching.  This gave a nice snug fit with a 5   Once I was satisfied with the spacer block collateral ligament retractors were placed and I completed the 4 distal femoral cuts   The extension gap was rechecked with the same spacer block which was a size 5   The correct sized notch cutting guide for the femur was then applied and the notch cut was made.  Trial reduction was completed using size 5 femur 4 tibia 5 polyethylene insert trial implants. Patella tracking was normal  We then skeletonized the patella. It measured 21 mm in thickness and the patellar resection was set for 9.5 millimeters. the patellar resection was completed. The patella diameter measured 35. We then drilled the peg holes for the patella.   The proximal tibia was prepared using the size 4 base plate.  Thorough irrigation was performed using saline  and the bone was dried and prepared for cement. The cement was mixed on the back table using third generation preparation techniques  The implants were then cemented in place and excess cement was removed. The cement was allowed to cure. Surgipor irrigation was placed followed by saline irrigation  Any excess bone fragments and cement was removed.  The extensor mechanism was closed with #1 Bralon suture followed by subcutaneous tissue closure using 0 Monocryl suture in 2 layers  Zynrelef a total of 2 vials were injected prior to complete extensor mechanism closure.  The openings and the capsule were then closed with #1 Braylon   Skin approximation was performed using staples  A sterile dressing was applied, TED hose were placed on the operative extremity followed by Cryo/Cuff.  The patient was taken recovery room in stable condition  Postop  plan: Weightbearing as tolerated CPM machine Immediate physical therapy Discharge tomorrow if stable

## 2023-06-16 NOTE — Evaluation (Signed)
Physical Therapy Evaluation Patient Details Name: Wendy Newman MRN: [id number] DOB: 03/01/1941 Today's Date: 06/16/2023  History of Present Illness  Wendy Newman is 82 year old female history of DVT left leg comes in for right knee pain with osteoarthritis failing injection physical therapy and anti-inflammatories>  TKR on 06/16/23  Clinical Impression  PT AROM 10-40; PROM to 45.  Took two steps and began to vomit.         If plan is discharge home, recommend the following: A little help with walking and/or transfers;A little help with bathing/dressing/bathroom;Assistance with cooking/housework;Assist for transportation   Can travel by private vehicle        Equipment Recommendations BSC/3in1  Recommendations for Other Services       Functional Status Assessment Patient has had a recent decline in their functional status and demonstrates the ability to make significant improvements in function in a reasonable and predictable amount of time.     Precautions / Restrictions Precautions Precautions: None Restrictions Weight Bearing Restrictions: No RLE Weight Bearing: Weight bearing as tolerated LLE Weight Bearing: Weight bearing as tolerated      Mobility  Bed Mobility Overal bed mobility: Needs Assistance Bed Mobility: Supine to Sit, Sit to Supine     Supine to sit: Min assist Sit to supine: Mod assist   General bed mobility comments: Needs assist with Rt LE    Transfers Overall transfer level: Needs assistance   Transfers: Sit to/from Stand Sit to Stand: Min assist                Ambulation/Gait Ambulation/Gait assistance: Contact guard assist Gait Distance (Feet): 2 Feet Assistive device: Rolling walker (2 wheels) Gait Pattern/deviations: Decreased stance time - right, Decreased step length - left       General Gait Details: PT became nauseated and then vomitted.  Stairs            Wheelchair Mobility     Tilt Bed    Modified Rankin  (Stroke Patients Only)       Balance                                             Pertinent Vitals/Pain Pain Assessment Pain Assessment: 0-10 Pain Score: 8  Pain Location: Rt knee Pain Descriptors / Indicators: Aching Pain Intervention(s): Limited activity within patient's tolerance (Pt states nurse has just given pt pain meds)    Home Living Family/patient expects to be discharged to:: Private residence Living Arrangements: Alone Available Help at Discharge: Family;Available 24 hours/day Type of Home: Apartment Home Access: Level entry       Home Layout: One level Home Equipment: Agricultural consultant (2 wheels)      Prior Function Prior Level of Function : Independent/Modified Independent                     Extremity/Trunk Assessment        Lower Extremity Assessment Lower Extremity Assessment: RLE deficits/detail RLE: Unable to fully assess due to pain       Communication   Communication Cueing Techniques: Verbal cues  Cognition Arousal: Alert Behavior During Therapy: WFL for tasks assessed/performed Overall Cognitive Status: Within Functional Limits for tasks assessed  General Comments      Exercises Total Joint Exercises Ankle Circles/Pumps: Both, 10 reps Quad Sets: Both, 10 reps Heel Slides: Right, 5 reps   Assessment/Plan    PT Assessment Patient needs continued PT services  PT Problem List Decreased strength;Decreased range of motion;Decreased activity tolerance;Decreased mobility;Pain       PT Treatment Interventions Gait training;Therapeutic exercise    PT Goals (Current goals can be found in the Care Plan section)  Acute Rehab PT Goals Patient Stated Goal: to walk better. Time For Goal Achievement: 06/18/23 Potential to Achieve Goals: Good    Frequency Min 5X/week     Co-evaluation               AM-PAC PT "6 Clicks" Mobility  Outcome Measure  Help needed turning from your back to your side while in a flat bed without using bedrails?: A Little Help needed moving from lying on your back to sitting on the side of a flat bed without using bedrails?: A Little Help needed moving to and from a bed to a chair (including a wheelchair)?: A Little Help needed standing up from a chair using your arms (e.g., wheelchair or bedside chair)?: A Little Help needed to walk in hospital room?: A Little Help needed climbing 3-5 steps with a railing? : A Lot 6 Click Score: 17    End of Session Equipment Utilized During Treatment: Gait belt Activity Tolerance: Patient limited by pain Patient left: in bed;with call bell/phone within reach;with bed alarm set Nurse Communication: Mobility status PT Visit Diagnosis: Unsteadiness on feet (R26.81);Muscle weakness (generalized) (M62.81);Pain Pain - Right/Left: Right Pain - part of body: Knee    Time: 8182-9937 PT Time Calculation (min) (ACUTE ONLY): 29 min   Charges:   PT Evaluation $PT Eval Low Complexity: 1 Low PT Treatments $Therapeutic Activity: 8-22 mins PT General Charges $$ ACUTE PT VISIT: 1 Visit         Virgina Organ, PT CLT [phone]  06/16/2023, 4:34 PM

## 2023-06-16 NOTE — Transfer of Care (Signed)
Immediate Anesthesia Transfer of Care Note  Patient: Wendy Newman  Procedure(s) Performed: TOTAL KNEE ARTHROPLASTY (Right: Knee)  Patient Location: PACU  Anesthesia Type: Spinal  Level of Consciousness: awake, alert , oriented, and patient cooperative  Airway & Oxygen Therapy: Patient Spontanous Breathing and Patient connected to face mask oxygen  Post-op Assessment: Report given to RN and Post -op Vital signs reviewed and stable  Post vital signs: Reviewed and stable  Last Vitals:  Vitals Value Taken Time  BP 117/55 06/16/23 0945  Temp    Pulse 61 06/16/23 0947  Resp 18 06/16/23 0947  SpO2 100 % 06/16/23 0947  Vitals shown include unfiled device data.  Last Pain:  Vitals:   06/16/23 0640  TempSrc: Oral  PainSc: 10-Worst pain ever         Complications: No notable events documented.

## 2023-06-16 NOTE — Progress Notes (Addendum)
Incentive spirometry given to patient. Explained to patient. Patient used this several times. Patient was able to reach 2,000. Pt verbalizes understanding how and when to use this after surgery.

## 2023-06-16 NOTE — Plan of Care (Signed)
  Problem: Acute Rehab PT Goals(only PT should resolve) Goal: Pt Will Go Sit To Supine/Side Flowsheets (Taken 06/16/2023 1628) Pt will go Sit to Supine/Side: with supervision Goal: Patient Will Transfer Sit To/From Stand Flowsheets (Taken 06/16/2023 1628) Patient will transfer sit to/from stand: with supervision Goal: Pt Will Ambulate Flowsheets (Taken 06/16/2023 1628) Pt will Ambulate:  50 feet  with contact guard assist  with rolling walker

## 2023-06-16 NOTE — TOC Initial Note (Signed)
Transition of Care [facility]) - Initial/Assessment Note    Patient Details  Name: Wendy Newman MRN: [id number] Date of Birth: [birth year]  [facility]) CM/SW Contact:    Villa Herb, LCSWA Phone Number: 06/16/2023, 2:07 PM  Clinical Narrative:                 CSW updated by Clifton Custard with [facility] that they have been set up for [facility] services with pt at D/C by Dr. Mort Sawyers office. Clifton Custard states they have needed orders already. TOC to follow.   Expected Discharge Plan: Home w Home Health Services Barriers to Discharge: Continued Medical Work up   Patient Goals and CMS Choice Patient states their goals for this hospitalization and ongoing recovery are:: return home CMS Medicare.gov Compare Post Acute Care list provided to:: Patient Choice offered to / list presented to : Patient      Expected Discharge Plan and Services In-house Referral: Clinical Social Work Discharge Planning Services: CM Consult Post Acute Care Choice: Home Health Living arrangements for the past 2 months: Apartment                                      Prior Living Arrangements/Services Living arrangements for the past 2 months: Apartment Lives with:: Self Patient language and need for interpreter reviewed:: Yes Do you feel safe going back to the place where you live?: Yes      Need for Family Participation in Patient Care: Yes (Comment) Care giver support system in place?: Yes (comment)   Criminal Activity/Legal Involvement Pertinent to Current Situation/Hospitalization: No - Comment as needed  Activities of Daily Living      Permission Sought/Granted                  Emotional Assessment Appearance:: Appears stated age Attitude/Demeanor/Rapport: Engaged Affect (typically observed): Accepting Orientation: : Oriented to Self, Oriented to Place, Oriented to  Time, Oriented to Situation Alcohol / Substance Use: Not Applicable Psych Involvement: No  (comment)  Admission diagnosis:  Osteoarthritis of right knee [M17.11] Patient Active Problem List   Diagnosis Date Noted   Osteoarthritis of right knee 06/16/2023   Acute deep vein thrombosis (DVT) of popliteal vein of left lower extremity (HCC) 10/02/2021   Allergic rhinoconjunctivitis 07/02/2017   Family history of colonic polyps 11/28/2014   Family history of colon cancer 11/28/2014   Constipation 11/28/2014   Vitamin D deficiency 06/16/2013   Hypertension    Anxiety    Osteoporosis    Cataract    Hyperlipemia 05/18/2009   Anxiety state 05/18/2009   NECK PAIN 05/18/2009   ALLERGIC RHINITIS 08/28/2008   OSTEOARTHRITIS 08/28/2008   POSTMENOPAUSAL OSTEOPOROSIS 08/28/2008   PAT 04/24/2008   OTHER ABNORMAL BLOOD CHEMISTRY 04/24/2008   Essential hypertension 12/16/2007   HEADACHE 12/16/2007   PCP:  Donita Brooks, MD Pharmacy:   [facility] - [place], [place] - [address] [place] [address] Phone: [phone] Fax: [phone]  [id number] - [place], [place] - [address] AT [facility] [address] [place] [address] Phone: [phone] Fax: [phone]     Social Determinants of Health (SDOH) Social History: SDOH Screenings   Food Insecurity: No Food Insecurity (12/17/2022)  Housing: Low Risk  (09/30/2022)  Transportation Needs: No Transportation Needs (09/30/2022)  Utilities: Not At Risk (09/30/2022)  Alcohol Screen: Low Risk  (  09/30/2022)  Depression (PHQ2-9): Low Risk  (09/30/2022)  Financial Resource Strain: Low Risk  (12/17/2022)  Social Connections: Socially Isolated (09/30/2022)  Stress: No Stress Concern Present (09/30/2022)  Tobacco Use: Low Risk  (06/16/2023)   SDOH Interventions:     Readmission Risk Interventions     No data to display

## 2023-06-16 NOTE — Plan of Care (Signed)
  Problem: Education: Goal: Knowledge of the prescribed therapeutic regimen will improve Outcome: Progressing Goal: Individualized Educational Video(s) Outcome: Progressing   Problem: Activity: Goal: Ability to avoid complications of mobility impairment will improve Outcome: Progressing Goal: Range of joint motion will improve Outcome: Progressing   

## 2023-06-16 NOTE — Progress Notes (Signed)
Pt arrived to room 307 via stretcher from PACU. Pt A&O x 4, IVF infusing without s/s infiltration. Polar pack intact to right knee, foot in bone foam per order. Dressing intact to right knee with 3 small areas of drainage noted. Pt with good movement, sensation in right foot/toes, toes warm and color WNL, cap refill < 2sec. Foley cath intact with >1000 ml clear urine in drainage bag. Pt oriented to room and safety procedures. Call bell within reach, bed in low position. Advised to call for needs, states understanding.

## 2023-06-16 NOTE — Anesthesia Preprocedure Evaluation (Addendum)
Anesthesia Evaluation  Patient identified by MRN, date of birth, ID band Patient awake    Reviewed: Allergy & Precautions, H&P , NPO status , Patient's Chart, lab work & pertinent test results, reviewed documented beta blocker date and time   History of Anesthesia Complications (+) PONV and history of anesthetic complications  Airway Mallampati: II  TM Distance: >3 FB Neck ROM: full    Dental no notable dental hx. (+) Missing   Pulmonary neg pulmonary ROS   Pulmonary exam normal breath sounds clear to auscultation       Cardiovascular Exercise Tolerance: Good hypertension,  Rhythm:regular Rate:Normal     Neuro/Psych  Headaches  Anxiety     negative neurological ROS  negative psych ROS   GI/Hepatic negative GI ROS, Neg liver ROS,,,  Endo/Other  negative endocrine ROS    Renal/GU negative Renal ROS  negative genitourinary   Musculoskeletal   Abdominal   Peds  Hematology negative hematology ROS (+)   Anesthesia Other Findings   Reproductive/Obstetrics negative OB ROS                             Anesthesia Physical Anesthesia Plan  ASA: 2  Anesthesia Plan: Spinal   Post-op Pain Management: Regional block*   Induction:   PONV Risk Score and Plan: Propofol infusion  Airway Management Planned:   Additional Equipment:   Intra-op Plan:   Post-operative Plan:   Informed Consent: I have reviewed the patients History and Physical, chart, labs and discussed the procedure including the risks, benefits and alternatives for the proposed anesthesia with the patient or authorized representative who has indicated his/her understanding and acceptance.     Dental Advisory Given  Plan Discussed with: CRNA  Anesthesia Plan Comments:        Anesthesia Quick Evaluation

## 2023-06-17 ENCOUNTER — Encounter (HOSPITAL_COMMUNITY): Payer: Self-pay | Admitting: Orthopedic Surgery

## 2023-06-17 ENCOUNTER — Telehealth: Payer: Self-pay | Admitting: Orthopedic Surgery

## 2023-06-17 DIAGNOSIS — R112 Nausea with vomiting, unspecified: Secondary | ICD-10-CM | POA: Diagnosis present

## 2023-06-17 DIAGNOSIS — M810 Age-related osteoporosis without current pathological fracture: Secondary | ICD-10-CM | POA: Diagnosis present

## 2023-06-17 DIAGNOSIS — M1711 Unilateral primary osteoarthritis, right knee: Secondary | ICD-10-CM | POA: Diagnosis present

## 2023-06-17 DIAGNOSIS — Z803 Family history of malignant neoplasm of breast: Secondary | ICD-10-CM | POA: Diagnosis not present

## 2023-06-17 DIAGNOSIS — Z800 Family history of malignant neoplasm of digestive organs: Secondary | ICD-10-CM | POA: Diagnosis not present

## 2023-06-17 DIAGNOSIS — Y848 Other medical procedures as the cause of abnormal reaction of the patient, or of later complication, without mention of misadventure at the time of the procedure: Secondary | ICD-10-CM | POA: Diagnosis present

## 2023-06-17 DIAGNOSIS — I10 Essential (primary) hypertension: Secondary | ICD-10-CM | POA: Diagnosis present

## 2023-06-17 DIAGNOSIS — Z8249 Family history of ischemic heart disease and other diseases of the circulatory system: Secondary | ICD-10-CM | POA: Diagnosis not present

## 2023-06-17 DIAGNOSIS — K9189 Other postprocedural complications and disorders of digestive system: Secondary | ICD-10-CM | POA: Diagnosis present

## 2023-06-17 DIAGNOSIS — Z86718 Personal history of other venous thrombosis and embolism: Secondary | ICD-10-CM | POA: Diagnosis not present

## 2023-06-17 DIAGNOSIS — Z801 Family history of malignant neoplasm of trachea, bronchus and lung: Secondary | ICD-10-CM | POA: Diagnosis not present

## 2023-06-17 DIAGNOSIS — Z90710 Acquired absence of both cervix and uterus: Secondary | ICD-10-CM | POA: Diagnosis not present

## 2023-06-17 LAB — CBC
HCT: 35.7 % — ABNORMAL LOW (ref 36.0–46.0)
Hemoglobin: 11.3 g/dL — ABNORMAL LOW (ref 12.0–15.0)
MCH: 30.1 pg (ref 26.0–34.0)
MCHC: 31.7 g/dL (ref 30.0–36.0)
MCV: 95.2 fL (ref 80.0–100.0)
Platelets: 176 10*3/uL (ref 150–400)
RBC: 3.75 MIL/uL — ABNORMAL LOW (ref 3.87–5.11)
RDW: 13.3 % (ref 11.5–15.5)
WBC: 13.2 10*3/uL — ABNORMAL HIGH (ref 4.0–10.5)
nRBC: 0 % (ref 0.0–0.2)

## 2023-06-17 LAB — BASIC METABOLIC PANEL
Anion gap: 8 (ref 5–15)
BUN: 18 mg/dL (ref 8–23)
CO2: 25 mmol/L (ref 22–32)
Calcium: 7.9 mg/dL — ABNORMAL LOW (ref 8.9–10.3)
Chloride: 102 mmol/L (ref 98–111)
Creatinine, Ser: 0.86 mg/dL (ref 0.44–1.00)
GFR, Estimated: >60 mL/min (ref >60)
Glucose, Bld: 129 mg/dL — ABNORMAL HIGH (ref 70–99)
Potassium: 3.4 mmol/L — ABNORMAL LOW (ref 3.5–5.1)
Sodium: 135 mmol/L (ref 135–145)

## 2023-06-17 MED ORDER — ONDANSETRON HCL 4 MG PO TABS
4.0000 mg | ORAL_TABLET | Freq: Four times a day (QID) | ORAL | Status: DC
  Administered 2023-06-17 – 2023-06-18 (×5): 4 mg via ORAL
  Filled 2023-06-17 (×5): qty 1

## 2023-06-17 NOTE — Telephone Encounter (Signed)
Dr. Mort Sawyers pt - Wendy Newman [phone] w/Utilization Review at [facility] lvm stating the pt is currently in room A307 and they are wanting an IP order for her, she is not progressing very much and only ambulated five feet and having issues w/postop care.  She would like a call back.

## 2023-06-17 NOTE — Telephone Encounter (Addendum)
Wants inpatient order for her. Please advise Wendy Newman [phone] or if she doesn't answer call [place] [phone]

## 2023-06-17 NOTE — Progress Notes (Signed)
Physical Therapy Treatment Patient Details Name: Wendy Newman MRN: [id number] DOB: [birth year] Today's Date: 06/17/2023   History of Present Illness Wendy Newman is 82 year old female history of DVT left leg comes in for right knee pain with osteoarthritis failing injection physical therapy and anti-inflammatories>  TKR on 06/16/23    PT Comments  Pt supine in bed and willing to participate with therapy.  Therapeutic exercises complete in bed with focus on knee ROM and quad strengthening.  Min A required with Rt LE with bed mobility and transfers, cueing for hand placement for safe sit to stand and foot placement prior seated for pain control.  Pt able to ambulate with CGA to chair, reports of nausea upon seated.  EOS pt left in chair with ice on knee with extension foam, call bell within reach and RN in room.   If plan is discharge home, recommend the following:     Can travel by private vehicle        Equipment Recommendations       Recommendations for Other Services       Precautions / Restrictions Precautions Precautions: None Restrictions Weight Bearing Restrictions: No RLE Weight Bearing: Weight bearing as tolerated LLE Weight Bearing: Weight bearing as tolerated     Mobility  Bed Mobility Overal bed mobility: Independent Bed Mobility: Supine to Sit, Sit to Supine     Supine to sit: Min assist     General bed mobility comments: Needs assist with Rt LE    Transfers Overall transfer level: Needs assistance Equipment used: Rolling walker (2 wheels) Transfers: Sit to/from Stand Sit to Stand: Min assist           General transfer comment: Cueing for hand placement to assist with standing, slide Rt knee forward prior sitting for pain control    Ambulation/Gait Ambulation/Gait assistance: Contact guard assist Gait Distance (Feet): 5 Feet Assistive device: Rolling walker (2 wheels) Gait Pattern/deviations: Decreased stance time - right, Decreased step length -  left       General Gait Details: Safe mechanics, c/o nausea once seated   Stairs             Wheelchair Mobility     Tilt Bed    Modified Rankin (Stroke Patients Only)       Balance                                            Cognition Arousal: Alert Behavior During Therapy: WFL for tasks assessed/performed Overall Cognitive Status: Within Functional Limits for tasks assessed                                          Exercises Total Joint Exercises Ankle Circles/Pumps: Both, 10 reps, Supine, Seated Quad Sets: 10 reps, Right Gluteal Sets: Right, 10 reps, Supine Heel Slides: Right, 10 reps, Supine (ankle supported by therapist) Goniometric ROM: 10-48 degrees    General Comments        Pertinent Vitals/Pain Pain Assessment Pain Score: 5  Pain Location: Rt knee Pain Descriptors / Indicators: Aching Pain Intervention(s): Limited activity within patient's tolerance, Repositioned, Ice applied    Home Living  Prior Function            PT Goals (current goals can now be found in the care plan section)      Frequency           PT Plan      Co-evaluation              AM-PAC PT "6 Clicks" Mobility   Outcome Measure  Help needed turning from your back to your side while in a flat bed without using bedrails?: A Little Help needed moving from lying on your back to sitting on the side of a flat bed without using bedrails?: A Little Help needed moving to and from a bed to a chair (including a wheelchair)?: A Little Help needed standing up from a chair using your arms (e.g., wheelchair or bedside chair)?: A Little Help needed to walk in hospital room?: A Little Help needed climbing 3-5 steps with a railing? : A Lot 6 Click Score: 17    End of Session Equipment Utilized During Treatment: Gait belt Activity Tolerance: Patient limited by pain;Patient tolerated treatment  well Patient left: in chair;with call bell/phone within reach;with nursing/sitter in room Nurse Communication: Mobility status PT Visit Diagnosis: Unsteadiness on feet (R26.81);Muscle weakness (generalized) (M62.81);Pain     Time: 2952-8413 PT Time Calculation (min) (ACUTE ONLY): 39 min  Charges:    $Gait Training: 8-22 mins $Therapeutic Activity: 23-37 mins PT General Charges $$ ACUTE PT VISIT: 1 Visit                     Becky Sax, LPTA/CLT; CBIS [phone]  Juel Burrow 06/17/2023, 10:49 AM

## 2023-06-17 NOTE — Progress Notes (Signed)
Subjective: 1 Day Post-Op Procedure(s) (LRB): TOTAL KNEE ARTHROPLASTY (Right) Patient reports pain as moderate and severe.    Objective: Vital signs in last 24 hours: Temp:  [97.9 F (36.6 C)-98.6 F (37 C)] 98 F (36.7 C) (08/21 0417) Pulse Rate:  [51-58] 58 (08/21 0417) Resp:  [12-20] 19 (08/21 0417) BP: (114-168)/(55-77) 114/55 (08/21 0417) SpO2:  [94 %-100 %] 100 % (08/21 0417)  Intake/Output from previous day: 08/20 0701 - 08/21 0700 In: 2821.5 [P.O.:240; I.V.:2581.5] Out: 4275 [Urine:4250; Blood:25] Intake/Output this shift: Total I/O In: 240 [P.O.:240] Out: -   Recent Labs    06/17/23 0415  HGB 11.3*   Recent Labs    06/17/23 0415  WBC 13.2*  RBC 3.75*  HCT 35.7*  PLT 176   Recent Labs    06/17/23 0415  NA 135  K 3.4*  CL 102  CO2 25  BUN 18  CREATININE 0.86  GLUCOSE 129*  CALCIUM 7.9*   No results for input(s): "LABPT", "INR" in the last 72 hours.  Neurologically intact ABD soft Neurovascular intact Sensation intact distally Intact pulses distally Dorsiflexion/Plantar flexion intact Compartment soft   Assessment/Plan: 1 Day Post-Op Procedure(s) (LRB): TOTAL KNEE ARTHROPLASTY (Right) Up with therapy Plan for discharge tomorrow  Address nausea   Increase ice use      Fuller Canada 06/17/2023, 11:00 AM

## 2023-06-17 NOTE — Progress Notes (Signed)
Patients foley removed per order, post op day one.

## 2023-06-17 NOTE — Anesthesia Postprocedure Evaluation (Signed)
Anesthesia Post Note  Patient: DEBRINA FERGURSON  Procedure(s) Performed: TOTAL KNEE ARTHROPLASTY (Right: Knee)  Patient location during evaluation: Phase II Anesthesia Type: Spinal Level of consciousness: awake Pain management: pain level controlled Vital Signs Assessment: post-procedure vital signs reviewed and stable Respiratory status: spontaneous breathing and respiratory function stable Cardiovascular status: blood pressure returned to baseline and stable Postop Assessment: no headache and no apparent nausea or vomiting Anesthetic complications: no Comments: Late entry   No notable events documented.   Last Vitals:  Vitals:   06/17/23 0417 06/17/23 1345  BP: (!) 114/55 (!) 153/66  Pulse: (!) 58 64  Resp: 19 18  Temp: 36.7 C 36.8 C  SpO2: 100% 97%    Last Pain:  Vitals:   06/17/23 1345  TempSrc: Oral  PainSc:                  Windell Norfolk

## 2023-06-17 NOTE — Progress Notes (Signed)
Physical Therapy Treatment Patient Details Name: YANETT WALDOCK MRN: [id number] DOB: 15-Jun-1941 Today's Date: 06/17/2023   History of Present Illness [name] is 82 year old female history of DVT left leg comes in for right knee pain with osteoarthritis failing injection physical therapy and anti-inflammatories>  TKR on 06/16/23    PT Comments  Patient lying in bed with foam extension pillow in place.  Patient agreeable to therapy treatment.  Patient performed supine and sitting there ex with cues for technique.  Supine to sit with min A for right leg; taking extra time secondary to pain.  Sit to stand to RW with mod assist today due to pain and right leg weakness.  Patient needs min A to CGA with RW to take a few steps over to the chair ; needs cues for hand placement and controlled descent.  patient left in chair with call button in reach; ice applied and feet elevated.  Patient will benefit from continued skilled therapy services during the remainder of her hospital stay and at the next recommended venue of care to address deficits and promote return to optimal function.        If plan is discharge home, recommend the following: A little help with walking and/or transfers;A little help with bathing/dressing/bathroom;Assistance with cooking/housework;Assist for transportation   Can travel by private vehicle        Equipment Recommendations       Recommendations for Other Services       Precautions / Restrictions Precautions Precautions: None Restrictions Weight Bearing Restrictions: No RLE Weight Bearing: Weight bearing as tolerated LLE Weight Bearing: Weight bearing as tolerated     Mobility  Bed Mobility Overal bed mobility: Independent Bed Mobility: Supine to Sit     Supine to sit: Min assist     General bed mobility comments: Needs assist with Rt LE    Transfers Overall transfer level: Needs assistance Equipment used: Rolling walker (2 wheels) Transfers: Sit  to/from Stand Sit to Stand: Mod assist           General transfer comment: Cueing for hand placement to assist with standing, slide Rt knee forward prior sitting for pain control; needed increased assist and time due to pain    Ambulation/Gait Ambulation/Gait assistance: Contact guard assist Gait Distance (Feet): 5 Feet Assistive device: Rolling walker (2 wheels) Gait Pattern/deviations: Decreased stance time - right, Decreased step length - left       General Gait Details: antalgic gait; decreased gait speed   Stairs             Wheelchair Mobility     Tilt Bed    Modified Rankin (Stroke Patients Only)       Balance Overall balance assessment: Needs assistance Sitting-balance support: Feet supported, Bilateral upper extremity supported Sitting balance-Leahy Scale: Fair Sitting balance - Comments: fair sitting balance initially; improved the longer patient sat   Standing balance support: Bilateral upper extremity supported, During functional activity, Reliant on assistive device for balance Standing balance-Leahy Scale: Fair Standing balance comment: fair standing balance with RW; painful with weight shifting to the right                            Cognition Arousal: Alert Behavior During Therapy: WFL for tasks assessed/performed Overall Cognitive Status: Within Functional Limits for tasks assessed  Exercises Other Exercises Other Exercises: supine ankle pumps, quad sets and heel slides x 10 each; sitting heel slides x 10; AAROM right knee in sitting 56 degrees    General Comments        Pertinent Vitals/Pain Pain Assessment Pain Assessment: 0-10 Pain Score: 4  Pain Location: Rt knee Pain Descriptors / Indicators: Aching, Grimacing, Moaning Pain Intervention(s): Limited activity within patient's tolerance, Monitored during session, Repositioned, Ice applied    Home Living                           Prior Function            PT Goals (current goals can now be found in the care plan section) Acute Rehab PT Goals Patient Stated Goal: to walk better. Time For Goal Achievement: 06/18/23 Potential to Achieve Goals: Good Progress towards PT goals: Progressing toward goals    Frequency    Min 5X/week      PT Plan      Co-evaluation              AM-PAC PT "6 Clicks" Mobility   Outcome Measure  Help needed turning from your back to your side while in a flat bed without using bedrails?: A Little Help needed moving from lying on your back to sitting on the side of a flat bed without using bedrails?: A Little Help needed moving to and from a bed to a chair (including a wheelchair)?: A Little Help needed standing up from a chair using your arms (e.g., wheelchair or bedside chair)?: A Little Help needed to walk in hospital room?: A Little Help needed climbing 3-5 steps with a railing? : A Lot 6 Click Score: 17    End of Session   Activity Tolerance: Patient limited by pain;Patient tolerated treatment well Patient left: in chair;with call bell/phone within reach Nurse Communication: Mobility status PT Visit Diagnosis: Unsteadiness on feet (R26.81);Muscle weakness (generalized) (M62.81);Pain Pain - Right/Left: Right Pain - part of body: Knee     Time: 0159-0230 PT Time Calculation (min) (ACUTE ONLY): 31 min  Charges:    $Therapeutic Activity: 23-37 mins PT General Charges $$ ACUTE PT VISIT: 1 Visit                     3:14 PM, 06/17/23 Haylee Mcanany Small Nohelia Valenza MPT [facility] [place] [phone] Ph:[phone]

## 2023-06-17 NOTE — Plan of Care (Signed)
  Problem: Education: Goal: Knowledge of the prescribed therapeutic regimen will improve Outcome: Progressing Goal: Individualized Educational Video(s) Outcome: Progressing   Problem: Clinical Measurements: Goal: Postoperative complications will be avoided or minimized Outcome: Progressing   Problem: Pain Management: Goal: Pain level will decrease with appropriate interventions Outcome: Progressing   Problem: Skin Integrity: Goal: Will show signs of wound healing Outcome: Progressing   Problem: Health Behavior/Discharge Planning: Goal: Ability to manage health-related needs will improve Outcome: Progressing   Problem: Education: Goal: Knowledge of General Education information will improve Description: Including pain rating scale, medication(s)/side effects and non-pharmacologic comfort measures Outcome: Progressing   Problem: Clinical Measurements: Goal: Ability to maintain clinical measurements within normal limits will improve Outcome: Progressing Goal: Will remain free from infection Outcome: Progressing Goal: Diagnostic test results will improve Outcome: Progressing Goal: Respiratory complications will improve Outcome: Progressing Goal: Cardiovascular complication will be avoided Outcome: Progressing   Problem: Elimination: Goal: Will not experience complications related to bowel motility Outcome: Progressing Goal: Will not experience complications related to urinary retention Outcome: Progressing   Problem: Pain Managment: Goal: General experience of comfort will improve Outcome: Progressing   Problem: Skin Integrity: Goal: Risk for impaired skin integrity will decrease Outcome: Progressing

## 2023-06-18 ENCOUNTER — Other Ambulatory Visit (HOSPITAL_COMMUNITY): Payer: Self-pay

## 2023-06-18 LAB — CBC
HCT: 34.2 % — ABNORMAL LOW (ref 36.0–46.0)
Hemoglobin: 11.2 g/dL — ABNORMAL LOW (ref 12.0–15.0)
MCH: 30.4 pg (ref 26.0–34.0)
MCHC: 32.7 g/dL (ref 30.0–36.0)
MCV: 92.9 fL (ref 80.0–100.0)
Platelets: 167 10*3/uL (ref 150–400)
RBC: 3.68 MIL/uL — ABNORMAL LOW (ref 3.87–5.11)
RDW: 13.7 % (ref 11.5–15.5)
WBC: 14.6 10*3/uL — ABNORMAL HIGH (ref 4.0–10.5)
nRBC: 0 % (ref 0.0–0.2)

## 2023-06-18 MED ORDER — HYDROCODONE-ACETAMINOPHEN 10-325 MG PO TABS: 1.0000 | ORAL_TABLET | ORAL | 0 refills | Status: DC | PRN

## 2023-06-18 MED ORDER — BISACODYL 5 MG PO TBEC: 5.0000 mg | DELAYED_RELEASE_TABLET | Freq: Every day | ORAL | 0 refills | Status: AC | PRN

## 2023-06-18 MED ORDER — POLYETHYLENE GLYCOL 3350 17 G PO PACK: 17.0000 g | PACK | Freq: Every day | ORAL | 0 refills | Status: DC | PRN

## 2023-06-18 MED ORDER — ONDANSETRON HCL 4 MG PO TABS: 4.0000 mg | ORAL_TABLET | Freq: Four times a day (QID) | ORAL | 0 refills | Status: DC

## 2023-06-18 MED ORDER — METHOCARBAMOL 500 MG PO TABS: 500.0000 mg | ORAL_TABLET | Freq: Four times a day (QID) | ORAL | 2 refills | Status: DC | PRN

## 2023-06-18 MED ORDER — SODIUM CHLORIDE 0.9 % IV BOLUS
500.0000 mL | Freq: Once | INTRAVENOUS | Status: AC
  Administered 2023-06-18: 500 mL via INTRAVENOUS

## 2023-06-18 MED ORDER — DOCUSATE SODIUM 100 MG PO CAPS: 100.0000 mg | ORAL_CAPSULE | Freq: Two times a day (BID) | ORAL | 0 refills | Status: DC

## 2023-06-18 MED ORDER — APIXABAN 2.5 MG PO TABS: 2.5000 mg | ORAL_TABLET | Freq: Two times a day (BID) | ORAL | 0 refills | Status: DC

## 2023-06-18 NOTE — TOC Benefit Eligibility Note (Signed)
Patient Product/process development scientist completed.    The patient is insured through [employer]. Patient has Medicare and is not eligible for a copay card, but may be able to apply for patient assistance, if available.    Ran test claim for Eliquis 5 mg and the current 30 day co-pay is $0.00.   This test claim was processed through [facility]- copay amounts may vary at other pharmacies due to pharmacy/plan contracts, or as the patient moves through the different stages of their insurance plan.     Roland Earl, CPHT Pharmacy Technician III Certified Patient Advocate [facility] Patient Advocate Team Direct Number: [phone]  Fax: [phone]

## 2023-06-18 NOTE — TOC Transition Note (Signed)
Transition of Care [facility]) - CM/SW Discharge Note   Patient Details  Name: Wendy Newman MRN: [id number] Date of Birth: 08/31/1941  Transition of Care [facility] CM/SW Contact:  Villa Herb, LCSWA Phone Number: 06/18/2023, 10:46 AM   Clinical Narrative:    Pt discharging home with [facility] today. CSW updated Clifton Custard with [facility] who will continue following. TOC signing off.   Final next level of care: Home w Home Health Services Barriers to Discharge: Barriers Resolved   Patient Goals and CMS Choice CMS Medicare.gov Compare Post Acute Care list provided to:: Patient Choice offered to / list presented to : Patient  Discharge Placement                         Discharge Plan and Services Additional resources added to the After Visit Summary for   In-house Referral: Clinical Social Work Discharge Planning Services: CM Consult Post Acute Care Choice: Home Health                    HH Arranged: PT [facility] Agency: [facility] Date [facility] Agency Contacted: 06/18/23   Representative spoke with at [facility] Agency: Clifton Custard  Social Determinants of Health (SDOH) Interventions SDOH Screenings   Food Insecurity: No Food Insecurity (06/16/2023)  Housing: Low Risk  (06/16/2023)  Transportation Needs: No Transportation Needs (06/16/2023)  Utilities: Not At Risk (06/16/2023)  Alcohol Screen: Low Risk  (09/30/2022)  Depression (PHQ2-9): Low Risk  (09/30/2022)  Financial Resource Strain: Low Risk  (12/17/2022)  Social Connections: Socially Isolated (09/30/2022)  Stress: No Stress Concern Present (09/30/2022)  Tobacco Use: Low Risk  (06/16/2023)     Readmission Risk Interventions     No data to display

## 2023-06-18 NOTE — Telephone Encounter (Signed)
Ok done

## 2023-06-18 NOTE — Progress Notes (Signed)
Physical Therapy Treatment Patient Details Name: Wendy Newman MRN: [id number] DOB: 07-30-1941 Today's Date: 06/18/2023 ROM 7-50, PROM to 55 Gt :  70ft   History of Present Illness Wendy Newman is 82 year old female history of DVT left leg comes in for right knee pain with osteoarthritis failing injection physical therapy and anti-inflammatories>  TKR on 06/16/23    PT Comments  PT without nausea today.  Able to walk 30 ft prior to feeling hot which precipitated nausea last time therapist worked with pt.  Improved I in all mobility.  Pt verbalizes that her granddaughter will be very helpful when she goes home.     If plan is discharge home, recommend the following: A little help with walking and/or transfers;A little help with bathing/dressing/bathroom;Assistance with cooking/housework;Assist for transportation   Can travel by private vehicle      yes  Equipment Recommendations    BSC 3in1   Recommendations for Other Services  [facility] PT/OT     Precautions / Restrictions Precautions Precautions: None Restrictions Weight Bearing Restrictions: No RLE Weight Bearing: Weight bearing as tolerated     Mobility  Bed Mobility Overal bed mobility: Independent Bed Mobility: Supine to Sit     Supine to sit: Contact guard          Transfers Overall transfer level: Needs assistance Equipment used: Rolling walker (2 wheels) Transfers: Sit to/from Stand Sit to Stand: Contact guard assist                Ambulation/Gait Ambulation/Gait assistance: Contact guard assist Gait Distance (Feet): 30 Feet Assistive device: Rolling walker (2 wheels) Gait Pattern/deviations: Decreased step length - left, Decreased stance time - right   Gait velocity interpretation: <1.8 ft/sec, indicate of risk for recurrent falls              Cognition Arousal: Alert Behavior During Therapy: WFL for tasks assessed/performed Overall Cognitive Status: Within Functional Limits for tasks assessed                                           Exercises Total Joint Exercises Ankle Circles/Pumps: Both, 10 reps, Supine Quad Sets: 10 reps, Right, Supine Gluteal Sets: Right, 10 reps, Supine Heel Slides: Right, 10 reps, Supine (ankle supported by therapist) Straight Leg Raises: Right, 10 reps, AROM Goniometric ROM: 7-50, PROM to 55        Pertinent Vitals/Pain Pain Assessment Pain Score: 5  Pain Location: Rt knee Pain Descriptors / Indicators: Aching, Grimacing Pain Intervention(s): Limited activity within patient's tolerance    Home Living  Ground apartment.                        Prior Function   Mod I          PT Goals (current goals can now be found in the care plan section) Acute Rehab PT Goals Patient Stated Goal: to walk better. Time For Goal Achievement: 06/18/23 Potential to Achieve Goals: Good Progress towards PT goals: Progressing toward goals    Frequency    Min 5X/week      PT Plan  Continue for improved ROM, I in mobility        AM-PAC PT "6 Clicks" Mobility   Outcome Measure  Help needed turning from your back to your side while in a flat bed without using bedrails?: A Little Help needed moving  from lying on your back to sitting on the side of a flat bed without using bedrails?: A Little Help needed moving to and from a bed to a chair (including a wheelchair)?: A Little Help needed standing up from a chair using your arms (e.g., wheelchair or bedside chair)?: A Little Help needed to walk in hospital room?: A Little Help needed climbing 3-5 steps with a railing? : A Lot 6 Click Score: 17    End of Session Equipment Utilized During Treatment: Gait belt Activity Tolerance: Patient tolerated treatment well;Patient limited by fatigue Patient left: in chair;with call bell/phone within reach Nurse Communication: Mobility status PT Visit Diagnosis: Unsteadiness on feet (R26.81);Muscle weakness (generalized)  (M62.81);Pain Pain - Right/Left: Right Pain - part of body: Knee     Time: 0830-0900 PT Time Calculation (min) (ACUTE ONLY): 30 min  Charges:    $Gait Training: 8-22 mins $Therapeutic Exercise: 8-22 mins PT General Charges $$ ACUTE PT VISIT: 1 Visit                      Virgina Organ, PT CLT [phone]  06/18/2023, 9:12 AM

## 2023-06-18 NOTE — Progress Notes (Signed)
Patient ID: Wendy Newman, female   DOB: August 28, 1941, 82 y.o.   MRN: [id number]  Postoperative note  Postop day 2 status post right total knee arthroplasty diagnosis osteoarthritis right knee  Postop complication nausea vomiting  This was treated with Zofran on a scheduled basis seem to have improved although the patient seems to have some difficulty when she is doing her therapy  Her range of motion is slow to improve currently measured at 5 to 55 degrees I believe  Now that the nausea has been controlled it is okay for the patient to go home today     Latest Ref Rng & Units 06/17/2023    4:15 AM 06/08/2023   11:31 AM 01/22/2023   10:16 AM  BMP  Glucose 70 - 99 mg/dL 106  86  93   BUN 8 - 23 mg/dL 18  21  15    Creatinine 0.44 - 1.00 mg/dL 2.69  4.85  4.62   BUN/Creat Ratio 6 - 22 (calc)   SEE NOTE:   Sodium 135 - 145 mmol/L 135  137  143   Potassium 3.5 - 5.1 mmol/L 3.4  3.5  4.2   Chloride 98 - 111 mmol/L 102  104  106   CO2 22 - 32 mmol/L 25  24  26    Calcium 8.9 - 10.3 mg/dL 7.9  8.7  9.3        Latest Ref Rng & Units 06/18/2023    4:09 AM 06/17/2023    4:15 AM 06/08/2023   11:31 AM  CBC  WBC 4.0 - 10.5 K/uL 14.6  13.2  6.3   Hemoglobin 12.0 - 15.0 g/dL 70.3  50.0  93.8   Hematocrit 36.0 - 46.0 % 34.2  35.7  37.3   Platelets 150 - 400 K/uL 167  176  194

## 2023-06-18 NOTE — Plan of Care (Signed)
  Problem: Education: Goal: Knowledge of the prescribed therapeutic regimen will improve Outcome: Adequate for Discharge Goal: Individualized Educational Video(s) Outcome: Adequate for Discharge   Problem: Activity: Goal: Ability to avoid complications of mobility impairment will improve Outcome: Adequate for Discharge Goal: Range of joint motion will improve Outcome: Adequate for Discharge   Problem: Clinical Measurements: Goal: Postoperative complications will be avoided or minimized Outcome: Adequate for Discharge   Problem: Pain Management: Goal: Pain level will decrease with appropriate interventions Outcome: Adequate for Discharge   Problem: Skin Integrity: Goal: Will show signs of wound healing Outcome: Adequate for Discharge   Problem: Education: Goal: Knowledge of General Education information will improve Description: Including pain rating scale, medication(s)/side effects and non-pharmacologic comfort measures Outcome: Adequate for Discharge   Problem: Health Behavior/Discharge Planning: Goal: Ability to manage health-related needs will improve Outcome: Adequate for Discharge   Problem: Clinical Measurements: Goal: Ability to maintain clinical measurements within normal limits will improve Outcome: Adequate for Discharge Goal: Will remain free from infection Outcome: Adequate for Discharge Goal: Diagnostic test results will improve Outcome: Adequate for Discharge Goal: Respiratory complications will improve Outcome: Adequate for Discharge Goal: Cardiovascular complication will be avoided Outcome: Adequate for Discharge   Problem: Activity: Goal: Risk for activity intolerance will decrease Outcome: Adequate for Discharge   Problem: Nutrition: Goal: Adequate nutrition will be maintained Outcome: Adequate for Discharge   Problem: Coping: Goal: Level of anxiety will decrease Outcome: Adequate for Discharge   Problem: Elimination: Goal: Will not  experience complications related to bowel motility Outcome: Adequate for Discharge Goal: Will not experience complications related to urinary retention Outcome: Adequate for Discharge   Problem: Pain Managment: Goal: General experience of comfort will improve Outcome: Adequate for Discharge   Problem: Safety: Goal: Ability to remain free from injury will improve Outcome: Adequate for Discharge   Problem: Skin Integrity: Goal: Risk for impaired skin integrity will decrease Outcome: Adequate for Discharge   Problem: Acute Rehab PT Goals(only PT should resolve) Goal: Pt Will Go Sit To Supine/Side Outcome: Adequate for Discharge Goal: Patient Will Transfer Sit To/From Stand Outcome: Adequate for Discharge Goal: Pt Will Ambulate Outcome: Adequate for Discharge

## 2023-06-18 NOTE — Discharge Summary (Signed)
Physician Discharge Summary  Patient ID: [facility] MRN: [id number] DOB/AGE: Dec 13, 1940 82 y.o.  Admit date: 06/16/2023 Discharge date: 06/18/2023  Admission Diagnoses: Osteoarthritis right knee  Discharge Diagnoses: Osteoarthritis right knee, postop nausea vomiting  Discharged Condition: good  Procedure: Right total knee arthroplasty  Hospital Course:   August 20 of admission patient was admitted for right total knee arthroplasty which was done under spinal anesthetic with postop saphenous nerve block and intraoperative Zynrelef through standard anterior midline incision.  There were no complications.  -She participated in physical therapy program to a limited degree secondary to nausea and pain.  August 21 postop day 1 the patient was still nauseous and this limited her participation in physical therapy and she was given Zofran on a scheduled basis to help with the nausea and vomiting.  This is improved over 24 hours  August 22 postop day 2 the nausea and vomiting was well-controlled although the patient did say that she was feeling hot after several steps that she took.  This was treated by giving her additional 500 cc bolus of fluid.  Her vitals remained stable her hemoglobin was 11.2    Attune fixed-bearing posterior stabilized total knee arthroplasty Implant Name Type Inv. Item Serial No. Manufacturer Lot No. LRB No. Used Action  CEMENT HV SMART SET - [id number] Cement CEMENT HV SMART SET  [employer] [id number] Right 2 Implanted  INSERT TIB FIX BEARNG SZ 5 - [id number] Insert INSERT TIB FIX BEARNG SZ 5  DEPU[employer]TH[employer] [id number] Right 1 Implanted  PATELLA MEDIAL ATTUN KNEE - [id number] Knees PATELLA MEDIAL ATTUN KNEE  DEPUY ORTH[employer]AEDICS [id number] Right 1 Implanted  ATTUNE PSFEM RTSZ5 NARCEM KNEE - [id number] Femur ATTUNE PSFEM RTSZ5 NARCEM KNEE  DEPU[employer]TH[employer] [id number] Right 1 Implanted  BSPLAT TIB 4 CMNT FX BRNG STRL - [id number] Stem BSPLAT  TIB 4 CMNT FX BRNG STRL  DEPU[employer]TH[employer] [id number] Right 1 Implanted         Discharge Exam: BP (!) 110/58 (BP Location: Right Arm)   Pulse 60   Temp 98 F (36.7 C) (Oral)   Resp 16   Ht 5\' 6"  (1.676 m)   Wt 79.4 kg   SpO2 98%   BMI 28.25 kg/m  Physical Exam Constitutional:      General: She is not in acute distress.    Appearance: Normal appearance. She is not ill-appearing, toxic-appearing or diaphoretic.  HENT:     Head: Normocephalic and atraumatic.  Eyes:     General: No scleral icterus. Cardiovascular:     Rate and Rhythm: Normal rate and regular rhythm.  Pulmonary:     Effort: Pulmonary effort is normal. No respiratory distress.     Breath sounds: No stridor. No wheezing or rhonchi.  Abdominal:     General: Abdomen is flat.     Palpations: Abdomen is soft.  Musculoskeletal:     Comments: Right lower extremity left lower extremity normal calf to palpation no tenderness.  Denna Haggard' sign negative.  No significant peripheral edema.  Dressing dry on the right leg with minimal drainage.  Neurological:     General: No focal deficit present.     Mental Status: She is alert and oriented to person, place, and time.     Gait: Gait abnormal.  Psychiatric:        Mood and Affect: Mood normal.        Behavior: Behavior normal.        Thought Content: Thought content normal.  Disposition: Discharge disposition: 01-Home or Self Care       Discharge Instructions     Call MD / Call 911   Complete by: As directed    If you experience chest pain or shortness of breath, CALL 911 and be transported to the hospital emergency room.  If you develope a fever above 101 F, pus (white drainage) or increased drainage or redness at the wound, or calf pain, call your surgeon's office.   Constipation Prevention   Complete by: As directed    Drink plenty of fluids.  Prune juice may be helpful.  You may use a stool softener, such as Colace (over the counter) 100 mg twice a day.   Use MiraLax (over the counter) for constipation as needed.   Diet - low sodium heart healthy   Complete by: As directed    Discharge instructions   Complete by: As directed    Blue foam 30 min 3 x a day  CPM machine: start 0-60 increase 10 per day; stop after 2 weeks   Ice cuff: 30 min 6 x a day for 30 min   White stockings for 6 weeks   Exercises: Daily as instructed   Dressing leave on   No shower or bath for the next 2 weeks   Increase activity slowly as tolerated   Complete by: As directed    Post-operative opioid taper instructions:   Complete by: As directed    POST-OPERATIVE OPIOID TAPER INSTRUCTIONS: It is important to wean off of your opioid medication as soon as possible. If you do not need pain medication after your surgery it is ok to stop day one. Opioids include: Codeine, Hydrocodone(Norco, Vicodin), Oxycodone(Percocet, oxycontin) and hydromorphone amongst others.  Long term and even short term use of opiods can cause: Increased pain response Dependence Constipation Depression Respiratory depression And more.  Withdrawal symptoms can include Flu like symptoms Nausea, vomiting And more Techniques to manage these symptoms Hydrate well Eat regular healthy meals Stay active Use relaxation techniques(deep breathing, meditating, yoga) Do Not substitute Alcohol to help with tapering If you have been on opioids for less than two weeks and do not have pain than it is ok to stop all together.  Plan to wean off of opioids This plan should start within one week post op of your joint replacement. Maintain the same interval or time between taking each dose and first decrease the dose.  Cut the total daily intake of opioids by one tablet each day Next start to increase the time between doses. The last dose that should be eliminated is the evening dose.         Allergies as of 06/18/2023   No Known Allergies      Medication List     STOP taking these  medications    meloxicam 7.5 MG tablet Commonly known as: MOBIC       TAKE these medications    alendronate 70 MG tablet Commonly known as: FOSAMAX Take 1 tablet (70 mg total) by mouth every 7 (seven) days. Take with a full glass of water on an empty stomach.   apixaban 2.5 MG Tabs tablet Commonly known as: Eliquis Take 1 tablet (2.5 mg total) by mouth 2 (two) times daily.   atorvastatin 20 MG tablet Commonly known as: LIPITOR Take 1 tablet (20 mg total) by mouth daily.   bisacodyl 5 MG EC tablet Commonly known as: DULCOLAX Take 1 tablet (5 mg total) by mouth daily as needed  for moderate constipation.   CALCIUM-MAGNESIUM-ZINC-VIT D3 PO Take 1 tablet by mouth daily.   citalopram 20 MG tablet Commonly known as: CELEXA Take 1 tablet (20 mg total) by mouth daily.   docusate sodium 100 MG capsule Commonly known as: COLACE Take 1 capsule (100 mg total) by mouth 2 (two) times daily.   fluticasone 50 MCG/ACT nasal spray Commonly known as: FLONASE Place 2 sprays into both nostrils daily.   hydrochlorothiazide 25 MG tablet Commonly known as: HYDRODIURIL Take 1 tablet (25 mg total) by mouth daily.   HYDROcodone-acetaminophen 10-325 MG tablet Commonly known as: Norco Take 1 tablet by mouth every 4 (four) hours as needed.   levocetirizine 5 MG tablet Commonly known as: XYZAL Take 1 tablet (5 mg total) by mouth every evening.   methocarbamol 500 MG tablet Commonly known as: ROBAXIN Take 1 tablet (500 mg total) by mouth every 6 (six) hours as needed for muscle spasms.   ondansetron 4 MG tablet Commonly known as: ZOFRAN Take 1 tablet (4 mg total) by mouth every 6 (six) hours.   polyethylene glycol 17 g packet Commonly known as: MIRALAX / GLYCOLAX Take 17 g by mouth daily as needed for mild constipation.         Signed: Fuller Canada 06/18/2023, 9:36 AM

## 2023-06-18 NOTE — Discharge Instructions (Signed)
Information on my medicine - ELIQUIS (apixaban)  Why was Eliquis prescribed for you? Eliquis was prescribed for you to reduce the risk of blood clots forming after orthopedic surgery.    What do You need to know about Eliquis? Take your Eliquis TWICE DAILY - one tablet in the morning and one tablet in the evening with or without food.  It would be best to take the dose about the same time each day.  If you have difficulty swallowing the tablet whole please discuss with your pharmacist how to take the medication safely.  Take Eliquis exactly as prescribed by your doctor and DO NOT stop taking Eliquis without talking to the doctor who prescribed the medication.  Stopping without other medication to take the place of Eliquis may increase your risk of developing a clot.  After discharge, you should have regular check-up appointments with your healthcare provider that is prescribing your Eliquis.  What do you do if you miss a dose? If a dose of ELIQUIS is not taken at the scheduled time, take it as soon as possible on the same day and twice-daily administration should be resumed.  The dose should not be doubled to make up for a missed dose.  Do not take more than one tablet of ELIQUIS at the same time.  Important Safety Information A possible side effect of Eliquis is bleeding. You should call your healthcare provider right away if you experience any of the following: Bleeding from an injury or your nose that does not stop. Unusual colored urine (red or dark brown) or unusual colored stools (red or black). Unusual bruising for unknown reasons. A serious fall or if you hit your head (even if there is no bleeding).  Some medicines may interact with Eliquis and might increase your risk of bleeding or clotting while on Eliquis. To help avoid this, consult your healthcare provider or pharmacist prior to using any new prescription or non-prescription medications, including herbals,  vitamins, non-steroidal anti-inflammatory drugs (NSAIDs) and supplements.  This website has more information on Eliquis (apixaban): http://[email or web address].com/eliquis/home  

## 2023-06-19 ENCOUNTER — Telehealth: Payer: Self-pay

## 2023-06-19 DIAGNOSIS — Z86718 Personal history of other venous thrombosis and embolism: Secondary | ICD-10-CM | POA: Diagnosis not present

## 2023-06-19 DIAGNOSIS — Z96651 Presence of right artificial knee joint: Secondary | ICD-10-CM | POA: Diagnosis not present

## 2023-06-19 DIAGNOSIS — Z79899 Other long term (current) drug therapy: Secondary | ICD-10-CM | POA: Diagnosis not present

## 2023-06-19 DIAGNOSIS — Z7901 Long term (current) use of anticoagulants: Secondary | ICD-10-CM | POA: Diagnosis not present

## 2023-06-19 DIAGNOSIS — F419 Anxiety disorder, unspecified: Secondary | ICD-10-CM | POA: Diagnosis not present

## 2023-06-19 DIAGNOSIS — M810 Age-related osteoporosis without current pathological fracture: Secondary | ICD-10-CM | POA: Diagnosis not present

## 2023-06-19 DIAGNOSIS — E559 Vitamin D deficiency, unspecified: Secondary | ICD-10-CM | POA: Diagnosis not present

## 2023-06-19 DIAGNOSIS — I10 Essential (primary) hypertension: Secondary | ICD-10-CM | POA: Diagnosis not present

## 2023-06-19 DIAGNOSIS — Z471 Aftercare following joint replacement surgery: Secondary | ICD-10-CM | POA: Diagnosis not present

## 2023-06-19 NOTE — Transitions of Care (Post Inpatient/ED Visit) (Signed)
   06/19/2023  Name: Wendy [facility] MRN: [id number] DOB: November 01, 1940  Today's TOC FU Call Status: Today's TOC FU Call Status:: Unsuccessful Call (1st Attempt) Unsuccessful Call (1st Attempt) Date: 06/19/23  Attempted to reach the patient regarding the most recent Inpatient/ED visit.  Follow Up Plan: Additional outreach attempts will be made to reach the patient to complete the Transitions of Care (Post Inpatient/ED visit) call.   Jodelle Gross RN, BSN, CCM [facility] RN Care Coordinator/ Transitions of Care Direct Dial: [phone]  Fax: [phone]

## 2023-06-20 DIAGNOSIS — Z79899 Other long term (current) drug therapy: Secondary | ICD-10-CM | POA: Diagnosis not present

## 2023-06-20 DIAGNOSIS — M810 Age-related osteoporosis without current pathological fracture: Secondary | ICD-10-CM | POA: Diagnosis not present

## 2023-06-20 DIAGNOSIS — I10 Essential (primary) hypertension: Secondary | ICD-10-CM | POA: Diagnosis not present

## 2023-06-20 DIAGNOSIS — E559 Vitamin D deficiency, unspecified: Secondary | ICD-10-CM | POA: Diagnosis not present

## 2023-06-20 DIAGNOSIS — Z86718 Personal history of other venous thrombosis and embolism: Secondary | ICD-10-CM | POA: Diagnosis not present

## 2023-06-20 DIAGNOSIS — Z471 Aftercare following joint replacement surgery: Secondary | ICD-10-CM | POA: Diagnosis not present

## 2023-06-20 DIAGNOSIS — Z7901 Long term (current) use of anticoagulants: Secondary | ICD-10-CM | POA: Diagnosis not present

## 2023-06-20 DIAGNOSIS — Z96651 Presence of right artificial knee joint: Secondary | ICD-10-CM | POA: Diagnosis not present

## 2023-06-20 DIAGNOSIS — F419 Anxiety disorder, unspecified: Secondary | ICD-10-CM | POA: Diagnosis not present

## 2023-06-22 ENCOUNTER — Encounter: Payer: 59 | Admitting: Pharmacist

## 2023-06-22 ENCOUNTER — Telehealth: Payer: Self-pay | Admitting: Orthopedic Surgery

## 2023-06-22 ENCOUNTER — Telehealth: Payer: Self-pay

## 2023-06-22 DIAGNOSIS — I10 Essential (primary) hypertension: Secondary | ICD-10-CM | POA: Diagnosis not present

## 2023-06-22 DIAGNOSIS — M810 Age-related osteoporosis without current pathological fracture: Secondary | ICD-10-CM | POA: Diagnosis not present

## 2023-06-22 DIAGNOSIS — M1711 Unilateral primary osteoarthritis, right knee: Secondary | ICD-10-CM | POA: Diagnosis not present

## 2023-06-22 DIAGNOSIS — Z7901 Long term (current) use of anticoagulants: Secondary | ICD-10-CM | POA: Diagnosis not present

## 2023-06-22 DIAGNOSIS — Z471 Aftercare following joint replacement surgery: Secondary | ICD-10-CM | POA: Diagnosis not present

## 2023-06-22 DIAGNOSIS — Z86718 Personal history of other venous thrombosis and embolism: Secondary | ICD-10-CM | POA: Diagnosis not present

## 2023-06-22 DIAGNOSIS — E559 Vitamin D deficiency, unspecified: Secondary | ICD-10-CM | POA: Diagnosis not present

## 2023-06-22 DIAGNOSIS — Z96651 Presence of right artificial knee joint: Secondary | ICD-10-CM | POA: Diagnosis not present

## 2023-06-22 DIAGNOSIS — F419 Anxiety disorder, unspecified: Secondary | ICD-10-CM | POA: Diagnosis not present

## 2023-06-22 DIAGNOSIS — Z79899 Other long term (current) drug therapy: Secondary | ICD-10-CM | POA: Diagnosis not present

## 2023-06-22 NOTE — Telephone Encounter (Signed)
Dr. Mort Sawyers pt - Armanda Heritage a nurse w/[facility], she works on doing transition of care for discharged pt, this patient has not gotten her CPM machine, she states she doesn't see that it was ordered. She asked that the pt be called [phone]

## 2023-06-22 NOTE — Transitions of Care (Post Inpatient/ED Visit) (Signed)
06/22/2023  Name: Wendy [facility] MRN: [id number] DOB: 1941-05-03  Today's TOC FU Call Status: Today's TOC FU Call Status:: Successful TOC FU Call Completed TOC FU Call Complete Date: 06/22/23 Patient's Name and Date of Birth confirmed.  Transition Care Management Follow-up Telephone Call Date of Discharge: 06/18/23 Discharge Facility: [facility] Type of Discharge: Inpatient Admission Primary Inpatient Discharge Diagnosis:: Right Total Knee Arthroplasty How have you been since you were released from the [facility]?: Better Any questions or concerns?: Yes Patient Questions/Concerns:: Patient has not received her CPM machine Patient Questions/Concerns Addressed: Notified Provider of Patient Questions/Concerns  Items Reviewed: Did you receive and understand the discharge instructions provided?: Yes Medications obtained,verified, and reconciled?: Yes (Medications Reviewed) Any new allergies since your discharge?: No Do you have support at home?: Yes People in Home: grandchild(ren) Name of Support/Comfort Primary Source: [facility]  Medications Reviewed Today: Medications Reviewed Today     Reviewed by Jodelle Gross, RN (Case Manager) on 06/22/23 at 1018  Med List Status: <None>   Medication Order Taking? Sig Documenting Provider Last Dose Status Informant  alendronate (FOSAMAX) 70 MG tablet [id number] Yes Take 1 tablet (70 mg total) by mouth every 7 (seven) days. Take with a full glass of water on an empty stomach. Wendy Brooks, MD Taking Active Self  apixaban (ELIQUIS) 2.5 MG TABS tablet [id number] Yes Take 1 tablet (2.5 mg total) by mouth 2 (two) times daily. Wendy Hearing, MD Taking Active   atorvastatin (LIPITOR) 20 MG tablet [id number] Yes Take 1 tablet (20 mg total) by mouth daily. Wendy Brooks, MD Taking Active Self  bisacodyl (DULCOLAX) 5 MG EC tablet [id number] Yes Take 1 tablet (5 mg total) by mouth daily as needed for moderate constipation. Wendy Hearing, MD Taking Active   CALCIUM-MAGNESIUM-ZINC-VIT D3 PO [id number] Yes Take 1 tablet by mouth daily. [provider] Taking Active Self  citalopram (CELEXA) 20 MG tablet [id number] Yes Take 1 tablet (20 mg total) by mouth daily. Wendy Brooks, MD Taking Active Self  docusate sodium (COLACE) 100 MG capsule [id number] Yes Take 1 capsule (100 mg total) by mouth 2 (two) times daily. Wendy Hearing, MD Taking Active   fluticasone [facility]) 50 MCG/ACT nasal spray [id number] Yes Place 2 sprays into both nostrils daily. Wendy Brooks, MD Taking Active Self  hydrochlorothiazide (HYDRODIURIL) 25 MG tablet [id number] Yes Take 1 tablet (25 mg total) by mouth daily. Wendy Brooks, MD Taking Active Self  HYDROcodone-acetaminophen [facility]) 10-325 MG tablet [id number] Yes Take 1 tablet by mouth every 4 (four) hours as needed. Wendy Hearing, MD Taking Active   levocetirizine (XYZAL) 5 MG tablet [id number] Yes Take 1 tablet (5 mg total) by mouth every evening. Wendy Brooks, MD Taking Active Self  methocarbamol (ROBAXIN) 500 MG tablet [id number] Yes Take 1 tablet (500 mg total) by mouth every 6 (six) hours as needed for muscle spasms. Wendy Hearing, MD Taking Active   ondansetron [facility]) 4 MG tablet [id number] Yes Take 1 tablet (4 mg total) by mouth every 6 (six) hours. Wendy Hearing, MD Taking Active   polyethylene glycol (MIRALAX / GLYCOLAX) 17 g packet [id number] Yes Take 17 g by mouth daily as needed for mild constipation. Wendy Hearing, MD Taking Active             Home Care and Equipment/Supplies: Were Home Health Services Ordered?: Yes Name of Home Health Agency:: [facility] Has Agency set up a time to come to your  home?: Yes First Home Health Visit Date: 06/20/23 Any new equipment or medical supplies ordered?: Yes (CPM Machine) Name of Medical supply agency?: Ordered through the Ortho office Were you able to get the equipment/medical supplies?: No Do  you have any questions related to the use of the equipment/supplies?: Yes What questions do you have?: Patient has not received, call placed to Ortho office and message left  Functional Questionnaire: Do you need assistance with bathing/showering or dressing?: Yes Do you need assistance with meal preparation?: No Do you need assistance with eating?: No Do you have difficulty maintaining continence: No Do you need assistance with getting out of bed/getting out of a chair/moving?: No Do you have difficulty managing or taking your medications?: No  Follow up appointments reviewed: PCP Follow-up appointment confirmed?: NA Specialist [facility] Follow-up appointment confirmed?: Yes Date of Specialist follow-up appointment?: 07/02/23 Follow-Up Specialty Provider:: Dr. Romeo Apple Do you need transportation to your follow-up appointment?: No Do you understand care options if your condition(s) worsen?: Yes-patient verbalized understanding  SDOH Interventions Today    Flowsheet Row Most Recent Value  SDOH Interventions   Food Insecurity Interventions Intervention Not Indicated  Housing Interventions Intervention Not Indicated  Transportation Interventions Intervention Not Indicated  Utilities Interventions Intervention Not Indicated      TOC Interventions Today    Flowsheet Row Most Recent Value  TOC Interventions   TOC Interventions Discussed/Reviewed TOC Interventions Discussed, TOC Interventions Reviewed, Contacted provider for patient needs     Jodelle Gross RN, BSN, CCM [place]  Population Health RN Care Coordinator/ Transitions of Care Direct Dial: [phone]  Fax: [phone]

## 2023-06-22 NOTE — Telephone Encounter (Signed)
Dr. Mort Sawyers pt - Misty Stanley w/[facility] [phone] lvm stating she needs verbal orders for this patient 2w1, 3w2

## 2023-06-22 NOTE — Telephone Encounter (Signed)
There was a delay sending order I asked someone to help, order not sent until day before surgery but it was sent I will call her

## 2023-06-22 NOTE — Telephone Encounter (Signed)
Called to leave verbal orders.

## 2023-06-23 ENCOUNTER — Telehealth: Payer: Self-pay | Admitting: Orthopedic Surgery

## 2023-06-23 LAB — TYPE AND SCREEN

## 2023-06-23 LAB — BPAM RBC

## 2023-06-23 NOTE — Telephone Encounter (Signed)
Dr. Mort Sawyers pt - spoke w/the pt, scheduled for 8/28.  She stated that she couldn't make it to the bathroom and wet on her stockings.  She took them off to wash them and she wants to know if she should put her left one back on.

## 2023-06-23 NOTE — Telephone Encounter (Signed)
Dr. Mort Sawyers pt - spoke w/the pt, she had surgery rt knee w/Dr. H on 8/20, 3 of her toes are a normal bruise color, some pain and some swelling...she wants to know if that's normal or if she needs to be seen...if seen, where do I need to put her?

## 2023-06-23 NOTE — Telephone Encounter (Signed)
I called her yes, she needs to put the hose back on, she voiced understanding

## 2023-06-24 ENCOUNTER — Encounter: Payer: Self-pay | Admitting: Orthopedic Surgery

## 2023-06-24 ENCOUNTER — Ambulatory Visit (INDEPENDENT_AMBULATORY_CARE_PROVIDER_SITE_OTHER): Payer: Medicare PPO | Admitting: Orthopedic Surgery

## 2023-06-24 ENCOUNTER — Other Ambulatory Visit (INDEPENDENT_AMBULATORY_CARE_PROVIDER_SITE_OTHER): Payer: Medicare PPO

## 2023-06-24 VITALS — BP 125/75 | HR 73

## 2023-06-24 DIAGNOSIS — I10 Essential (primary) hypertension: Secondary | ICD-10-CM | POA: Diagnosis not present

## 2023-06-24 DIAGNOSIS — Z86718 Personal history of other venous thrombosis and embolism: Secondary | ICD-10-CM | POA: Diagnosis not present

## 2023-06-24 DIAGNOSIS — M810 Age-related osteoporosis without current pathological fracture: Secondary | ICD-10-CM | POA: Diagnosis not present

## 2023-06-24 DIAGNOSIS — F419 Anxiety disorder, unspecified: Secondary | ICD-10-CM | POA: Diagnosis not present

## 2023-06-24 DIAGNOSIS — Z471 Aftercare following joint replacement surgery: Secondary | ICD-10-CM | POA: Diagnosis not present

## 2023-06-24 DIAGNOSIS — M79672 Pain in left foot: Secondary | ICD-10-CM

## 2023-06-24 DIAGNOSIS — Z7901 Long term (current) use of anticoagulants: Secondary | ICD-10-CM | POA: Diagnosis not present

## 2023-06-24 DIAGNOSIS — S92535A Nondisplaced fracture of distal phalanx of left lesser toe(s), initial encounter for closed fracture: Secondary | ICD-10-CM | POA: Diagnosis not present

## 2023-06-24 DIAGNOSIS — E559 Vitamin D deficiency, unspecified: Secondary | ICD-10-CM | POA: Diagnosis not present

## 2023-06-24 DIAGNOSIS — Z79899 Other long term (current) drug therapy: Secondary | ICD-10-CM | POA: Diagnosis not present

## 2023-06-24 DIAGNOSIS — Z96651 Presence of right artificial knee joint: Secondary | ICD-10-CM | POA: Diagnosis not present

## 2023-06-24 NOTE — Progress Notes (Signed)
   VISIT TYPE: POST OP VISIT   Chief Complaint  Patient presents with   Post-op Follow-up    Right knee 06/16/23 is having bleeding through bandage, also states left toes are bruised and sore     Encounter Diagnosis  Name Primary?   Pain in left foot Yes    PROCEDURE: Right total knee  DATE OF SURGERY: August 20  POV # 1   Current Outpatient Medications:    alendronate (FOSAMAX) 70 MG tablet, Take 1 tablet (70 mg total) by mouth every 7 (seven) days. Take with a full glass of water on an empty stomach., Disp: 12 tablet, Rfl: 2   apixaban (ELIQUIS) 2.5 MG TABS tablet, Take 1 tablet (2.5 mg total) by mouth 2 (two) times daily., Disp: 84 tablet, Rfl: 0   atorvastatin (LIPITOR) 20 MG tablet, Take 1 tablet (20 mg total) by mouth daily., Disp: 90 tablet, Rfl: 1   bisacodyl (DULCOLAX) 5 MG EC tablet, Take 1 tablet (5 mg total) by mouth daily as needed for moderate constipation., Disp: 90 each, Rfl: 0   CALCIUM-MAGNESIUM-ZINC-VIT D3 PO, Take 1 tablet by mouth daily., Disp: , Rfl:    citalopram (CELEXA) 20 MG tablet, Take 1 tablet (20 mg total) by mouth daily., Disp: 90 tablet, Rfl: 1   docusate sodium (COLACE) 100 MG capsule, Take 1 capsule (100 mg total) by mouth 2 (two) times daily., Disp: 10 capsule, Rfl: 0   fluticasone (FLONASE) 50 MCG/ACT nasal spray, Place 2 sprays into both nostrils daily., Disp: 48 g, Rfl: 2   hydrochlorothiazide (HYDRODIURIL) 25 MG tablet, Take 1 tablet (25 mg total) by mouth daily., Disp: 90 tablet, Rfl: 1   HYDROcodone-acetaminophen (NORCO) 10-325 MG tablet, Take 1 tablet by mouth every 4 (four) hours as needed., Disp: 42 tablet, Rfl: 0   levocetirizine (XYZAL) 5 MG tablet, Take 1 tablet (5 mg total) by mouth every evening., Disp: 100 tablet, Rfl: 1   methocarbamol (ROBAXIN) 500 MG tablet, Take 1 tablet (500 mg total) by mouth every 6 (six) hours as needed for muscle spasms., Disp: 60 tablet, Rfl: 2   ondansetron (ZOFRAN) 4 MG tablet, Take 1 tablet (4 mg total)  by mouth every 6 (six) hours., Disp: 20 tablet, Rfl: 0   polyethylene glycol (MIRALAX / GLYCOLAX) 17 g packet, Take 17 g by mouth daily as needed for mild constipation., Disp: 14 each, Rfl: 0   Subjective   She noticed bruising on the toes of the left nonoperative foot with no history of trauma.  She is on Eliquis.  She had a history of DVT on the left leg.  Right knee incision looks clean dry and intact small amount of bloody drainage  Images of the left foot were placed in the chart x-ray was taken.  She has 2 fractures 1 on the distal phalanx of the third digit and one on the distal phalanx of the fourth digit they are nondisplaced  No treatment needed  ASSESSMENT AND PLAN   Status post right total knee arthroplasty doing well.  Fractures of the distal phalanx of the third and fourth digit noted

## 2023-06-26 DIAGNOSIS — I10 Essential (primary) hypertension: Secondary | ICD-10-CM | POA: Diagnosis not present

## 2023-06-26 DIAGNOSIS — Z7901 Long term (current) use of anticoagulants: Secondary | ICD-10-CM | POA: Diagnosis not present

## 2023-06-26 DIAGNOSIS — E559 Vitamin D deficiency, unspecified: Secondary | ICD-10-CM | POA: Diagnosis not present

## 2023-06-26 DIAGNOSIS — F419 Anxiety disorder, unspecified: Secondary | ICD-10-CM | POA: Diagnosis not present

## 2023-06-26 DIAGNOSIS — Z79899 Other long term (current) drug therapy: Secondary | ICD-10-CM | POA: Diagnosis not present

## 2023-06-26 DIAGNOSIS — Z96651 Presence of right artificial knee joint: Secondary | ICD-10-CM | POA: Diagnosis not present

## 2023-06-26 DIAGNOSIS — M810 Age-related osteoporosis without current pathological fracture: Secondary | ICD-10-CM | POA: Diagnosis not present

## 2023-06-26 DIAGNOSIS — Z471 Aftercare following joint replacement surgery: Secondary | ICD-10-CM | POA: Diagnosis not present

## 2023-06-26 DIAGNOSIS — Z86718 Personal history of other venous thrombosis and embolism: Secondary | ICD-10-CM | POA: Diagnosis not present

## 2023-06-28 DIAGNOSIS — M810 Age-related osteoporosis without current pathological fracture: Secondary | ICD-10-CM | POA: Diagnosis not present

## 2023-06-28 DIAGNOSIS — F419 Anxiety disorder, unspecified: Secondary | ICD-10-CM | POA: Diagnosis not present

## 2023-06-28 DIAGNOSIS — Z471 Aftercare following joint replacement surgery: Secondary | ICD-10-CM | POA: Diagnosis not present

## 2023-06-28 DIAGNOSIS — Z96651 Presence of right artificial knee joint: Secondary | ICD-10-CM | POA: Diagnosis not present

## 2023-06-28 DIAGNOSIS — Z79899 Other long term (current) drug therapy: Secondary | ICD-10-CM | POA: Diagnosis not present

## 2023-06-28 DIAGNOSIS — E559 Vitamin D deficiency, unspecified: Secondary | ICD-10-CM | POA: Diagnosis not present

## 2023-06-28 DIAGNOSIS — Z86718 Personal history of other venous thrombosis and embolism: Secondary | ICD-10-CM | POA: Diagnosis not present

## 2023-06-28 DIAGNOSIS — I10 Essential (primary) hypertension: Secondary | ICD-10-CM | POA: Diagnosis not present

## 2023-06-28 DIAGNOSIS — Z7901 Long term (current) use of anticoagulants: Secondary | ICD-10-CM | POA: Diagnosis not present

## 2023-06-30 DIAGNOSIS — Z79899 Other long term (current) drug therapy: Secondary | ICD-10-CM | POA: Diagnosis not present

## 2023-06-30 DIAGNOSIS — Z7901 Long term (current) use of anticoagulants: Secondary | ICD-10-CM | POA: Diagnosis not present

## 2023-06-30 DIAGNOSIS — E559 Vitamin D deficiency, unspecified: Secondary | ICD-10-CM | POA: Diagnosis not present

## 2023-06-30 DIAGNOSIS — F419 Anxiety disorder, unspecified: Secondary | ICD-10-CM | POA: Diagnosis not present

## 2023-06-30 DIAGNOSIS — Z471 Aftercare following joint replacement surgery: Secondary | ICD-10-CM | POA: Diagnosis not present

## 2023-06-30 DIAGNOSIS — Z86718 Personal history of other venous thrombosis and embolism: Secondary | ICD-10-CM | POA: Diagnosis not present

## 2023-06-30 DIAGNOSIS — M810 Age-related osteoporosis without current pathological fracture: Secondary | ICD-10-CM | POA: Diagnosis not present

## 2023-06-30 DIAGNOSIS — I10 Essential (primary) hypertension: Secondary | ICD-10-CM | POA: Diagnosis not present

## 2023-06-30 DIAGNOSIS — Z96651 Presence of right artificial knee joint: Secondary | ICD-10-CM | POA: Diagnosis not present

## 2023-07-02 ENCOUNTER — Ambulatory Visit (INDEPENDENT_AMBULATORY_CARE_PROVIDER_SITE_OTHER): Payer: Medicare PPO | Admitting: Orthopedic Surgery

## 2023-07-02 ENCOUNTER — Encounter: Payer: Self-pay | Admitting: Orthopedic Surgery

## 2023-07-02 VITALS — BP 125/75

## 2023-07-02 DIAGNOSIS — Z86718 Personal history of other venous thrombosis and embolism: Secondary | ICD-10-CM | POA: Diagnosis not present

## 2023-07-02 DIAGNOSIS — Z96651 Presence of right artificial knee joint: Secondary | ICD-10-CM | POA: Diagnosis not present

## 2023-07-02 DIAGNOSIS — Z79899 Other long term (current) drug therapy: Secondary | ICD-10-CM | POA: Diagnosis not present

## 2023-07-02 DIAGNOSIS — Z7901 Long term (current) use of anticoagulants: Secondary | ICD-10-CM | POA: Diagnosis not present

## 2023-07-02 DIAGNOSIS — F419 Anxiety disorder, unspecified: Secondary | ICD-10-CM | POA: Diagnosis not present

## 2023-07-02 DIAGNOSIS — Z471 Aftercare following joint replacement surgery: Secondary | ICD-10-CM | POA: Diagnosis not present

## 2023-07-02 DIAGNOSIS — E559 Vitamin D deficiency, unspecified: Secondary | ICD-10-CM | POA: Diagnosis not present

## 2023-07-02 DIAGNOSIS — I10 Essential (primary) hypertension: Secondary | ICD-10-CM | POA: Diagnosis not present

## 2023-07-02 DIAGNOSIS — M810 Age-related osteoporosis without current pathological fracture: Secondary | ICD-10-CM | POA: Diagnosis not present

## 2023-07-02 NOTE — Progress Notes (Signed)
Chief Complaint  Patient presents with   Post-op Follow-up    Knee replacement right 06/16/23 improving  staples removed has outpatient therapy starting tomorrow.    Encounter Diagnosis  Name Primary?   S/P total knee replacement, right 06/16/23 Yes    2 weeks postop right total knee  Attune fixed-bearing posterior stabilized  Today Staples out  Pain is well-controlled  Therapy is going well  Wound is clean without infection  Flexion arc is 95  4-week follow-up

## 2023-07-03 ENCOUNTER — Ambulatory Visit (HOSPITAL_COMMUNITY): Payer: Medicare PPO

## 2023-07-06 ENCOUNTER — Ambulatory Visit (INDEPENDENT_AMBULATORY_CARE_PROVIDER_SITE_OTHER): Payer: Medicare PPO | Admitting: Family Medicine

## 2023-07-06 VITALS — BP 130/72 | HR 75 | Temp 98.2°F | Ht 66.0 in | Wt 171.6 lb

## 2023-07-06 DIAGNOSIS — M25561 Pain in right knee: Secondary | ICD-10-CM | POA: Diagnosis not present

## 2023-07-06 DIAGNOSIS — Z23 Encounter for immunization: Secondary | ICD-10-CM | POA: Diagnosis not present

## 2023-07-06 DIAGNOSIS — Z96651 Presence of right artificial knee joint: Secondary | ICD-10-CM

## 2023-07-06 DIAGNOSIS — G8918 Other acute postprocedural pain: Secondary | ICD-10-CM

## 2023-07-06 DIAGNOSIS — I10 Essential (primary) hypertension: Secondary | ICD-10-CM | POA: Diagnosis not present

## 2023-07-06 MED ORDER — HYDROCODONE-ACETAMINOPHEN 10-325 MG PO TABS: 1.0000 | ORAL_TABLET | ORAL | 0 refills | Status: DC | PRN

## 2023-07-06 NOTE — Progress Notes (Signed)
Subjective:    Patient ID: Wendy Newman, female    DOB: Oct 30, 1940, 82 y.o.   MRN: [id number]  Knee Pain    Patient is a very sweet 82 year old Caucasian female who is here today for hospital discharge follow-up.  She was recently admitted to the hospital and underwent a right knee replacement.  This occurred approximately 3 weeks ago.  She continues to have significant pain in her right leg.  She is walking with a cane.  She is out of pain medication and she is requesting refill on this today.  she is dealing with intermittent constipation, but this improves if she takes Dulcolax.  She denies any cough.  She denies any fever or chills.  She denies any hemoptysis.  She denies any pleurisy.  She denies any shortness of breath.  She denies any dysuria, urgency, frequency, or hematuria.  She does have some residual swelling around her right knee.  However there is no evidence of cellulitis or septic arthritis.  There is no erythema or warmth.  The operative incision is clean dry and intact.  Staples have been removed. Past Medical History:  Diagnosis Date   Anxiety    Cataract    DVT (deep venous thrombosis) (HCC)    Edema of both legs    Essential hypertension    Osteoporosis    PONV (postoperative nausea and vomiting)    Vitamin D deficiency    Past Surgical History:  Procedure Laterality Date   ABDOMINAL HYSTERECTOMY     Complete   CATARACT EXTRACTION Bilateral    CESAREAN SECTION     X 3   COLONOSCOPY  10/10/2008   HCW:[id number]   COLONOSCOPY WITH PROPOFOL N/A 12/21/2014   Dr. Rourk:noncompliant left colon and redundant right colon, rectal and colonic mucosa appeared normal.    TOTAL KNEE ARTHROPLASTY Right 06/16/2023   Procedure: TOTAL KNEE ARTHROPLASTY;  Surgeon: Vickki Hearing, MD;  Location: [facility];  Service: Orthopedics;  Laterality: Right;   Current Outpatient Medications on File Prior to Visit  Medication Sig Dispense Refill   alendronate (FOSAMAX) 70 MG tablet  Take 1 tablet (70 mg total) by mouth every 7 (seven) days. Take with a full glass of water on an empty stomach. 12 tablet 2   apixaban (ELIQUIS) 2.5 MG TABS tablet Take 1 tablet (2.5 mg total) by mouth 2 (two) times daily. 84 tablet 0   atorvastatin (LIPITOR) 20 MG tablet Take 1 tablet (20 mg total) by mouth daily. 90 tablet 1   bisacodyl (DULCOLAX) 5 MG EC tablet Take 1 tablet (5 mg total) by mouth daily as needed for moderate constipation. 90 each 0   CALCIUM-MAGNESIUM-ZINC-VIT D3 PO Take 1 tablet by mouth daily.     citalopram (CELEXA) 20 MG tablet Take 1 tablet (20 mg total) by mouth daily. 90 tablet 1   fluticasone (FLONASE) 50 MCG/ACT nasal spray Place 2 sprays into both nostrils daily. 48 g 2   hydrochlorothiazide (HYDRODIURIL) 25 MG tablet Take 1 tablet (25 mg total) by mouth daily. 90 tablet 1   levocetirizine (XYZAL) 5 MG tablet Take 1 tablet (5 mg total) by mouth every evening. 100 tablet 1   polyethylene glycol (MIRALAX / GLYCOLAX) 17 g packet Take 17 g by mouth daily as needed for mild constipation. 14 each 0   docusate sodium (COLACE) 100 MG capsule Take 1 capsule (100 mg total) by mouth 2 (two) times daily. (Patient not taking: Reported on 07/06/2023) 10 capsule 0  HYDROcodone-acetaminophen (NORCO) 10-325 MG tablet Take 1 tablet by mouth every 4 (four) hours as needed. (Patient not taking: Reported on 07/06/2023) 42 tablet 0   methocarbamol (ROBAXIN) 500 MG tablet Take 1 tablet (500 mg total) by mouth every 6 (six) hours as needed for muscle spasms. (Patient not taking: Reported on 07/06/2023) 60 tablet 2   ondansetron (ZOFRAN) 4 MG tablet Take 1 tablet (4 mg total) by mouth every 6 (six) hours. (Patient not taking: Reported on 07/06/2023) 20 tablet 0   No current facility-administered medications on file prior to visit.   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  Tobacco Use   Smoking status: Never   Smokeless tobacco: Never  Vaping Use   Vaping status: Never Used  Substance and Sexual Activity   Alcohol use: No    Alcohol/week: 0.0 standard drinks of alcohol   Drug use: No   Sexual activity: Yes    Birth control/protection: Surgical  Other Topics Concern   Not on file  Social History Narrative   Lives alone    Does not drive relies on grandson or neighbor for transportation    Social Determinants of Health   Financial Resource Strain: Low Risk  (12/17/2022)   Overall Financial Resource Strain (CARDIA)    Difficulty of Paying Living Expenses: Not very hard  Food Insecurity: No Food Insecurity (06/22/2023)   Hunger Vital Sign    Worried About Running Out of Food in the Last Year: Never true    Ran Out of Food in the Last Year: Never true  Transportation Needs: No Transportation Needs (06/22/2023)   PRAPARE - Administrator, Civil Service (Medical): No    Lack of Transportation (Non-Medical): No  Physical Activity: Not on file  Stress: No Stress Concern Present (09/30/2022)   [employer] of Occupational Health - Occupational Stress Questionnaire    Feeling of Stress : Not at all  Social Connections: Socially Isolated (09/30/2022)   Social Connection and Isolation Panel [NHANES]    Frequency of Communication with Friends and Family: Twice a week    Frequency of Social Gatherings with Friends and Family: Once a week    Attends Religious Services: Never    Database administrator or Organizations: No    Attends Banker Meetings: Never    Marital Status: Widowed  Intimate Partner Violence: Not At Risk (06/16/2023)   Humiliation, Afraid, Rape, and Kick questionnaire    Fear of Current or Ex-Partner: No    Emotionally Abused: No    Physically Abused: No    Sexually Abused: No      Review of  Systems  All other systems reviewed and are negative.      Objective:   Physical Exam Vitals reviewed.  Constitutional:      General: She is not in acute distress.    Appearance: Normal appearance. She is normal weight. She is not ill-appearing, toxic-appearing or diaphoretic.  HENT:     Head: Normocephalic and atraumatic.     Right Ear: Tympanic membrane, ear canal and external ear normal. There is no impacted cerumen.     Left Ear: Tympanic membrane, ear canal and external ear normal. There is no  impacted cerumen.     Nose: No congestion or rhinorrhea.     Mouth/Throat:     Mouth: Mucous membranes are moist.     Pharynx: Oropharynx is clear. No oropharyngeal exudate or posterior oropharyngeal erythema.  Eyes:     General:        Left eye: No discharge.     Extraocular Movements: Extraocular movements intact.     Conjunctiva/sclera: Conjunctivae normal.     Pupils: Pupils are equal, round, and reactive to light.  Neck:     Vascular: No carotid bruit.  Cardiovascular:     Rate and Rhythm: Normal rate and regular rhythm.     Pulses: Normal pulses.     Heart sounds: Normal heart sounds. No murmur heard.    No friction rub. No gallop.  Pulmonary:     Effort: Pulmonary effort is normal. No respiratory distress.     Breath sounds: Normal breath sounds. No stridor. No wheezing, rhonchi or rales.  Chest:     Chest wall: No tenderness.  Abdominal:     General: Abdomen is flat. Bowel sounds are normal. There is no distension.     Palpations: Abdomen is soft.     Tenderness: There is no abdominal tenderness. There is no guarding or rebound.  Musculoskeletal:        General: Tenderness and deformity present. No swelling.     Cervical back: Normal range of motion and neck supple. No rigidity or tenderness.     Right lower leg: No edema.     Left lower leg: No edema.  Lymphadenopathy:     Cervical: No cervical adenopathy.  Skin:    Coloration: Skin is not jaundiced.     Findings:  No bruising, erythema or rash.  Neurological:     General: No focal deficit present.     Mental Status: She is alert and oriented to person, place, and time. Mental status is at baseline.     Cranial Nerves: No cranial nerve deficit.     Sensory: No sensory deficit.     Motor: No weakness.     Coordination: Coordination normal.     Gait: Gait normal.     Deep Tendon Reflexes: Reflexes normal.  Psychiatric:        Mood and Affect: Mood normal.        Behavior: Behavior normal.        Thought Content: Thought content normal.        Judgment: Judgment normal.           Assessment & Plan:  History of total right knee replacement - Plan: CBC with Differential/Platelet, COMPLETE METABOLIC PANEL WITH GFR  Needs flu shot - Plan: Flu vaccine trivalent PF, 6mos and older(Flulaval,Afluria,Fluarix,Fluzone)  Post-op pain Physically, the patient seems to agree with.  No evidence of any significant or urinary tract infection.  She is on lifelong anticoagulation with Eliquis due to her history of DVT.  There is no evidence of the DVT on today's exam.  I will check a CBC to evaluate for any evidence of leukocytosis or postoperative anemia.  Check CMP to monitor electrolytes.  Blood pressure today is excellent.  I will refill the patient's hydrocodone for postoperative pain.  She can take this every 6 hours as needed for pain.  She is trying to wean away from this.  I will also consult home health physical therapy.  The patient does not have any transportation to physical therapy.  However she would still  benefit that she is having to walk using a cane and feels unsteady on her feet.  I will try to get this approved.

## 2023-07-07 ENCOUNTER — Other Ambulatory Visit: Payer: Self-pay

## 2023-07-07 ENCOUNTER — Ambulatory Visit (HOSPITAL_COMMUNITY): Payer: Medicare PPO | Attending: Orthopedic Surgery

## 2023-07-07 DIAGNOSIS — M25561 Pain in right knee: Secondary | ICD-10-CM | POA: Diagnosis not present

## 2023-07-07 DIAGNOSIS — Z96651 Presence of right artificial knee joint: Secondary | ICD-10-CM | POA: Diagnosis not present

## 2023-07-07 DIAGNOSIS — R262 Difficulty in walking, not elsewhere classified: Secondary | ICD-10-CM | POA: Insufficient documentation

## 2023-07-07 DIAGNOSIS — M1711 Unilateral primary osteoarthritis, right knee: Secondary | ICD-10-CM | POA: Insufficient documentation

## 2023-07-07 DIAGNOSIS — G8929 Other chronic pain: Secondary | ICD-10-CM | POA: Insufficient documentation

## 2023-07-07 LAB — COMPLETE METABOLIC PANEL WITH GFR
AG Ratio: 1.4 (calc) (ref 1.0–2.5)
ALT: 10 U/L (ref 6–29)
AST: 14 U/L (ref 10–35)
Albumin: 4.1 g/dL (ref 3.6–5.1)
Alkaline phosphatase (APISO): 70 U/L (ref 37–153)
BUN: 13 mg/dL (ref 7–25)
CO2: 26 mmol/L (ref 20–32)
Calcium: 9.7 mg/dL (ref 8.6–10.4)
Chloride: 103 mmol/L (ref 98–110)
Creat: 0.92 mg/dL (ref 0.60–1.00)
Globulin: 2.9 g/dL (ref 1.9–3.7)
Glucose, Bld: 95 mg/dL (ref 65–99)
Potassium: 4.1 mmol/L (ref 3.5–5.3)
Sodium: 139 mmol/L (ref 135–146)
Total Bilirubin: 0.7 mg/dL (ref 0.2–1.2)
Total Protein: 7 g/dL (ref 6.1–8.1)
eGFR: 63 mL/min/{1.73_m2} (ref >=60)

## 2023-07-07 LAB — CBC WITH DIFFERENTIAL/PLATELET
Absolute Monocytes: 671 {cells}/uL (ref 200–950)
Basophils Absolute: 31 {cells}/uL (ref 0–200)
Basophils Relative: 0.4 %
Eosinophils Absolute: 23 {cells}/uL (ref 15–500)
Eosinophils Relative: 0.3 %
HCT: 35.4 % (ref 35.0–45.0)
Hemoglobin: 11.6 g/dL — ABNORMAL LOW (ref 11.7–15.5)
Lymphs Abs: 2246 {cells}/uL (ref 850–3900)
MCH: 30.2 pg (ref 27.0–33.0)
MCHC: 32.8 g/dL (ref 32.0–36.0)
MCV: 92.2 fL (ref 80.0–100.0)
MPV: 10.4 fL (ref 7.5–12.5)
Monocytes Relative: 8.6 %
Neutro Abs: 4828 {cells}/uL (ref 1500–7800)
Neutrophils Relative %: 61.9 %
Platelets: 437 10*3/uL — ABNORMAL HIGH (ref 140–400)
RBC: 3.84 10*6/uL (ref 3.80–5.10)
RDW: 13 % (ref 11.0–15.0)
Total Lymphocyte: 28.8 %
WBC: 7.8 10*3/uL (ref 3.8–10.8)

## 2023-07-07 NOTE — Therapy (Signed)
OUTPATIENT PHYSICAL THERAPY EVALUATION (LOWER EXTREMITY)   Patient Name: Wendy Newman MRN: [id number] DOB:April 09, 1941, 82 y.o., female Today's Date: 07/07/2023  END OF SESSION:   PT End of Session - 07/07/23 1333     Visit Number 1    Number of Visits 16    Date for PT Re-Evaluation 08/18/23    Authorization Type [employer]    Authorization Time Period Request put in 9/10-10/22 for 12 visits    PT Start Time 1345    PT Stop Time 1425    PT Time Calculation (min) 40 min    Activity Tolerance Patient limited by pain              Past Medical History:  Diagnosis Date   Anxiety    Cataract    DVT (deep venous thrombosis) (HCC)    Edema of both legs    Essential hypertension    Osteoporosis    PONV (postoperative nausea and vomiting)    Vitamin D deficiency    Past Surgical History:  Procedure Laterality Date   ABDOMINAL HYSTERECTOMY     Complete   CATARACT EXTRACTION Bilateral    CESAREAN SECTION     X 3   COLONOSCOPY  10/10/2008   WUJ:[id number]   COLONOSCOPY WITH PROPOFOL N/A 12/21/2014   Dr. Rourk:noncompliant left colon and redundant right colon, rectal and colonic mucosa appeared normal.    TOTAL KNEE ARTHROPLASTY Right 06/16/2023   Procedure: TOTAL KNEE ARTHROPLASTY;  Surgeon: Vickki Hearing, MD;  Location: [facility];  Service: Orthopedics;  Laterality: Right;   Patient Active Problem List   Diagnosis Date Noted   Nausea & vomiting 06/17/2023   Osteoarthritis of right knee 06/16/2023   Acute deep vein thrombosis (DVT) of popliteal vein of left lower extremity (HCC) 10/02/2021   Allergic rhinoconjunctivitis 07/02/2017   Family history of colonic polyps 11/28/2014   Family history of colon cancer 11/28/2014   Constipation 11/28/2014   Vitamin D deficiency 06/16/2013   Hypertension    Anxiety    Osteoporosis    Cataract    Hyperlipemia 05/18/2009   Anxiety state 05/18/2009   NECK PAIN 05/18/2009   ALLERGIC RHINITIS 08/28/2008    OSTEOARTHRITIS 08/28/2008   POSTMENOPAUSAL OSTEOPOROSIS 08/28/2008   PAT 04/24/2008   OTHER ABNORMAL BLOOD CHEMISTRY 04/24/2008   Essential hypertension 12/16/2007   HEADACHE 12/16/2007    PCP: Donita Brooks, MDRef Provider (PCP)   REFERRING PROVIDER:   Vickki Hearing, MD    REFERRING DIAG: M17.11 (ICD-10-CM) - Unilateral primary osteoarthritis, right knee   Rationale for Evaluation and Treatment: Rehabilitation  THERAPY DIAG:  No diagnosis found.  ONSET DATE: right TKR August 20/2024 --------------------------------------------------------------------------------------------- SUBJECTIVE:  SUBJECTIVE STATEMENT: Patient underwent right TKR 06/16/23. Patient stayed in hospital for 2 nights and was discharged to home PT for 3x/wk for 2 weeks. Patient was walking at home and stubbed two toes on left lower extremity and fractured two toes right after surgery; MD did nothing to left lower extremity toes and will heal on its own. Pt presents to OPPT.   PERTINENT HISTORY:  HTN  PAIN:  Are you having pain? Yes: NPRS scale: 3/10 Pain location: right knee Pain description: dull/achy Aggravating factors: walking, standing, bending  Relieving factors: rest  PRECAUTIONS: None  RED FLAGS: None   WEIGHT BEARING RESTRICTIONS: No  FALLS:  Has patient fallen in last 6 months? No  LIVING ENVIRONMENT: Lives with: lives alone Lives in: House/apartment Stairs: No Has following equipment at home: Single point cane and Environmental consultant - 2 wheeled  OCCUPATION: retired   PLOF: Independent  PATIENT GOALS: to walk pain free  NEXT MD VISIT: October 3 --------------------------------------------------------------------------------------------- OBJECTIVE:   DIAGNOSTIC FINDINGS:  XR:  Narrative &  Impression  CLINICAL DATA:  Postop right knee replacement.   EXAM: PORTABLE RIGHT KNEE - 1-2 VIEW   COMPARISON:  None Available.   FINDINGS: Right knee arthroplasty in expected alignment. No periprosthetic lucency or fracture. There has been patellar resurfacing. Recent postsurgical change includes air and edema in the soft tissues and joint space. Anterior skin staples in place.   IMPRESSION: Right knee arthroplasty without immediate postoperative complication    PATIENT SURVEYS:  FOTO [phone]  SCREENING FOR RED FLAGS: Bowel or bladder incontinence: No Spinal tumors: No Cauda equina syndrome: No Compression fracture: No Abdominal aneurysm: No  COGNITION: Overall cognitive status: Within functional limits for tasks assessed  POSTURE: No Significant postural limitations      FUNCTIONAL TESTS:  2 minute walk test: 190 ft   GAIT ANALYSIS: Distance walked: 154feet Assistive device utilized: Single point cane Level of assistance: SBA Comments: Patient with moderate antalgia; maintains right knee in extension during stance phase  SENSATION: WFL    LOWER EXTREMITY MMT:    MMT Right eval  Hip flexion   Hip extension   Hip abduction   Hip adduction   Hip internal rotation   Hip external rotation   Knee flexion 3-/5  Knee extension 3-/5  Ankle dorsiflexion   Ankle plantarflexion   Ankle inversion   Ankle eversion    (Blank rows = not tested)  LOWER EXTREMITY ROM:     Active / PROM  Right eval  Hip flexion   Hip extension   Hip abduction   Hip adduction   Hip internal rotation   Hip external rotation   Knee flexion A:0-75* P: 0-90*  Knee extension A:(5) P:0*  Ankle dorsiflexion   Ankle plantarflexion   Ankle inversion   Ankle eversion    (Blank rows = not tested)  LOWER EXTREMITY SPECIAL TESTS Knee special tests: not indicated  PALPATION: Moderate tenderness to palpation right knee incision; moderate ms guarding  INTEGUMENTARY    Incision CDI; moderate swelling. 1+ pitting edema on right knee --------------------------------------------------------------------------------------------- TODAY'S TREATMENT:  DATE:   07/07/23 Therapeutic exercise x10' -Supine right quad sets 3-5" holds  -Supine heel slides x10    PATIENT EDUCATION:  Education details: HEP, pain management  Person educated: Patient Education method: Explanation and Demonstration Education comprehension: verbalized understanding and returned demonstration  HOME EXERCISE PROGRAM: Access Code: [email or web address] URL: [email or web address] Date: 07/07/2023 Prepared by: Seymour Bars  Exercises - Supine Quad Set  - 7 x weekly - 10-20 reps - 3secs hold - Supine Heel Slide  - 7 x weekly - 10-20 reps - Seated Long Arc Quad  - 7 x weekly - 10-20 reps --------------------------------------------------------------------------------------------- ASSESSMENT:  CLINICAL IMPRESSION: Patient is a 82 y.o. y.o. female who was seen today for physical therapy evaluation and treatment for s/p right TKR 06/16/23. Patient presents to PT with the following objective impairments: Abnormal gait, decreased activity tolerance, decreased balance, difficulty walking, decreased ROM, decreased strength, improper body mechanics, and pain. These impairments limit the patient in activities such as carrying, lifting, bending, standing, squatting, stairs, bed mobility, bathing, toileting, and locomotion level. These impairments also limit the patient in participation such as meal prep, cleaning, laundry, personal finances, interpersonal relationship, driving, shopping, community activity, yard work, and church. The patient will benefit from PT to address the limitations/impairments listed below to return to their prior level of function in the domains of  activity and participation.    PERSONAL FACTORS:  n/a  are also affecting patient's functional outcome.   REHAB POTENTIAL: Good  CLINICAL DECISION MAKING: Stable/uncomplicated  EVALUATION COMPLEXITY: Low  ----------------------------------------------------------------------------------------- GOALS: Goals reviewed with patient? No  SHORT TERM GOALS: Target date: 07/07/23   Patient will walk >/= 220 feet for the to improve lower extremity strength/endurance to facilitate safe ambulation around home and community  Baseline: 133ft Goal status: INITIAL  2.  Patient will score a  on the  >/= 53 on the FOTO  to demonstrate an improvement in ADL completion, stair negotiation, household/community ambulation, and self-care Baseline:  Goal status: INITIAL  3. Patient will be independent with a basic stretching/strengthening HEP  Baseline:  Goal status: INITIAL   LONG TERM GOALS: Target date: 08/18/2023    Patient will walk >/= 250 feet for the to improve lower extremity strength/endurance to facilitate safe ambulation around home and community  Baseline: 154ft Goal status: INITIAL  2.  Patient will demo right knee active range of motion into both flexion/ extension to [facility] to facilitate ADL completion, stairs, squatting.   Baseline: see above  Goal status: INITIAL  3.  Patient will be independent with a comprehensive strengthening HEP  Baseline:  Goal status: INITIAL  --------------------------------------------------------------------------------------------- PLAN:  PT FREQUENCY: 2x/week  PT DURATION: 6 weeks  PLANNED INTERVENTIONS: Therapeutic exercises, Therapeutic activity, Neuromuscular re-education, Balance training, Gait training, Patient/Family education, Self Care, and Joint mobilization.  PLAN FOR NEXT SESSION: Progress right knee active range of motion/ strength    Seymour Bars, PT 07/07/2023, 1:37 PM

## 2023-07-09 ENCOUNTER — Ambulatory Visit (HOSPITAL_COMMUNITY): Payer: Medicare PPO

## 2023-07-09 DIAGNOSIS — R262 Difficulty in walking, not elsewhere classified: Secondary | ICD-10-CM

## 2023-07-09 DIAGNOSIS — M25561 Pain in right knee: Secondary | ICD-10-CM | POA: Diagnosis not present

## 2023-07-09 DIAGNOSIS — Z96651 Presence of right artificial knee joint: Secondary | ICD-10-CM | POA: Diagnosis not present

## 2023-07-09 DIAGNOSIS — M1711 Unilateral primary osteoarthritis, right knee: Secondary | ICD-10-CM | POA: Diagnosis not present

## 2023-07-09 DIAGNOSIS — G8929 Other chronic pain: Secondary | ICD-10-CM | POA: Diagnosis not present

## 2023-07-09 NOTE — Therapy (Signed)
OUTPATIENT PHYSICAL THERAPY Treatment   Patient Name: Wendy Newman MRN: [id number] DOB:02/26/1941, 82 y.o., female Today's Date: 07/09/2023  END OF SESSION:   PT End of Session - 07/09/23 1036     Visit Number 2    Number of Visits 16    Date for PT Re-Evaluation 08/18/23    Authorization Type [employer]    Authorization Time Period until 08/18/23    Authorization - Visit Number 2    Authorization - Number of Visits 12    Progress Note Due on Visit 12    PT Start Time 1030    PT Stop Time 1110    PT Time Calculation (min) 40 min    Activity Tolerance Patient limited by pain    Behavior During Therapy WFL for tasks assessed/performed              Past Medical History:  Diagnosis Date   Anxiety    Cataract    DVT (deep venous thrombosis) (HCC)    Edema of both legs    Essential hypertension    Osteoporosis    PONV (postoperative nausea and vomiting)    Vitamin D deficiency    Past Surgical History:  Procedure Laterality Date   ABDOMINAL HYSTERECTOMY     Complete   CATARACT EXTRACTION Bilateral    CESAREAN SECTION     X 3   COLONOSCOPY  10/10/2008   [id number]   COLONOSCOPY WITH PROPOFOL N/A 12/21/2014   Dr. Rourk:noncompliant left colon and redundant right colon, rectal and colonic mucosa appeared normal.    TOTAL KNEE ARTHROPLASTY Right 06/16/2023   Procedure: TOTAL KNEE ARTHROPLASTY;  Surgeon: Vickki Hearing, MD;  Location: [facility];  Service: Orthopedics;  Laterality: Right;   Patient Active Problem List   Diagnosis Date Noted   Nausea & vomiting 06/17/2023   Osteoarthritis of right knee 06/16/2023   Acute deep vein thrombosis (DVT) of popliteal vein of left lower extremity (HCC) 10/02/2021   Allergic rhinoconjunctivitis 07/02/2017   Family history of colonic polyps 11/28/2014   Family history of colon cancer 11/28/2014   Constipation 11/28/2014   Vitamin D deficiency 06/16/2013   Hypertension    Anxiety    Osteoporosis    Cataract     Hyperlipemia 05/18/2009   Anxiety state 05/18/2009   NECK PAIN 05/18/2009   ALLERGIC RHINITIS 08/28/2008   OSTEOARTHRITIS 08/28/2008   POSTMENOPAUSAL OSTEOPOROSIS 08/28/2008   PAT 04/24/2008   OTHER ABNORMAL BLOOD CHEMISTRY 04/24/2008   Essential hypertension 12/16/2007   HEADACHE 12/16/2007    PCP: Donita Brooks, MDRef Provider (PCP)   REFERRING PROVIDER:   Vickki Hearing, MD    REFERRING DIAG: M17.11 (ICD-10-CM) - Unilateral primary osteoarthritis, right knee   Rationale for Evaluation and Treatment: Rehabilitation  THERAPY DIAG:  No diagnosis found.  ONSET DATE: right TKR August 20/2024 --------------------------------------------------------------------------------------------- SUBJECTIVE:  SUBJECTIVE STATEMENT: Today: Patient received in waiting room; ambulated into clinic with single point cane + stand-by assist. Patient states increased soreness today    IE: Patient underwent right TKR 06/16/23. Patient stayed in hospital for 2 nights and was discharged to home PT for 3x/wk for 2 weeks. Patient was walking at home and stubbed two toes on left lower extremity and fractured two toes right after surgery; MD did nothing to left lower extremity toes and will heal on its own. Pt presents to OPPT.   PERTINENT HISTORY:  HTN  PAIN:  Are you having pain? Yes: NPRS scale: 3/10 Pain location: right knee Pain description: dull/achy Aggravating factors: walking, standing, bending  Relieving factors: rest  PRECAUTIONS: None  RED FLAGS: None   WEIGHT BEARING RESTRICTIONS: No  FALLS:  Has patient fallen in last 6 months? No  LIVING ENVIRONMENT: Lives with: lives alone Lives in: House/apartment Stairs: No Has following equipment at home: Single point cane and Environmental consultant - 2  wheeled  OCCUPATION: retired   PLOF: Independent  PATIENT GOALS: to walk pain free  NEXT MD VISIT: October 3 --------------------------------------------------------------------------------------------- OBJECTIVE:   DIAGNOSTIC FINDINGS:  XR:  Narrative & Impression  CLINICAL DATA:  Postop right knee replacement.   EXAM: PORTABLE RIGHT KNEE - 1-2 VIEW   COMPARISON:  None Available.   FINDINGS: Right knee arthroplasty in expected alignment. No periprosthetic lucency or fracture. There has been patellar resurfacing. Recent postsurgical change includes air and edema in the soft tissues and joint space. Anterior skin staples in place.   IMPRESSION: Right knee arthroplasty without immediate postoperative complication    PATIENT SURVEYS:  FOTO [phone]  SCREENING FOR RED FLAGS: Bowel or bladder incontinence: No Spinal tumors: No Cauda equina syndrome: No Compression fracture: No Abdominal aneurysm: No  COGNITION: Overall cognitive status: Within functional limits for tasks assessed  POSTURE: No Significant postural limitations      FUNCTIONAL TESTS:  2 minute walk test: 190 ft   GAIT ANALYSIS: Distance walked: 164feet Assistive device utilized: Single point cane Level of assistance: SBA Comments: Patient with moderate antalgia; maintains right knee in extension during stance phase  SENSATION: WFL    LOWER EXTREMITY MMT:    MMT Right eval  Hip flexion   Hip extension   Hip abduction   Hip adduction   Hip internal rotation   Hip external rotation   Knee flexion 3-/5  Knee extension 3-/5  Ankle dorsiflexion   Ankle plantarflexion   Ankle inversion   Ankle eversion    (Blank rows = not tested)  LOWER EXTREMITY ROM:     Active / PROM  Right eval  Hip flexion   Hip extension   Hip abduction   Hip adduction   Hip internal rotation   Hip external rotation   Knee flexion A:0-75* P: 0-90*  Knee extension A:(5) P:0*  Ankle dorsiflexion    Ankle plantarflexion   Ankle inversion   Ankle eversion    (Blank rows = not tested)  LOWER EXTREMITY SPECIAL TESTS Knee special tests: not indicated  PALPATION: Moderate tenderness to palpation right knee incision; moderate ms guarding  INTEGUMENTARY   Incision CDI; moderate swelling. 1+ pitting edema on right knee --------------------------------------------------------------------------------------------- TODAY'S TREATMENT:  DATE:   07/09/23 Manual therapy x20' -soft tissue mobilization to right knee for blood circulation/ edema management  -PROM to right knee with end range oscillations Therapeutic exercise x20'  -Supine right quad sets with foam roller @ ankle 20x3" -Supine SAQs 1# 2x10  -Seated LAQs with 1# 2x10   -Cold pack to right knee, elevated, 5' end of session     07/07/23 Therapeutic exercise x10' -Supine right quad sets 3-5" holds  -Supine heel slides x10    PATIENT EDUCATION:  Education details: HEP, pain management  Person educated: Patient Education method: Explanation and Demonstration Education comprehension: verbalized understanding and returned demonstration  HOME EXERCISE PROGRAM: Access Code: [email or web address] URL: [email or web address] Date: 07/07/2023 Prepared by: Seymour Bars  Exercises - Supine Quad Set  - 7 x weekly - 10-20 reps - 3secs hold - Supine Heel Slide  - 7 x weekly - 10-20 reps - Seated Long Arc Quad  - 7 x weekly - 10-20 reps --------------------------------------------------------------------------------------------- ASSESSMENT:  CLINICAL IMPRESSION: Today: PT initiated gentle active range of motion strengthening for right lower extremity after soft tissue mobilization. Pt tolerated very well; requires minimal verbal cues for proper progression.  Pt will continue to benefit from PT to return  to prior level of function    IE: Patient is a 82 y.o. y.o. female who was seen today for physical therapy evaluation and treatment for s/p right TKR 06/16/23. Patient presents to PT with the following objective impairments: Abnormal gait, decreased activity tolerance, decreased balance, difficulty walking, decreased ROM, decreased strength, improper body mechanics, and pain. These impairments limit the patient in activities such as carrying, lifting, bending, standing, squatting, stairs, bed mobility, bathing, toileting, and locomotion level. These impairments also limit the patient in participation such as meal prep, cleaning, laundry, personal finances, interpersonal relationship, driving, shopping, community activity, yard work, and church. The patient will benefit from PT to address the limitations/impairments listed below to return to their prior level of function in the domains of activity and participation.    PERSONAL FACTORS:  n/a  are also affecting patient's functional outcome.   REHAB POTENTIAL: Good  CLINICAL DECISION MAKING: Stable/uncomplicated  EVALUATION COMPLEXITY: Low  ----------------------------------------------------------------------------------------- GOALS: Goals reviewed with patient? No  SHORT TERM GOALS: Target date: 07/09/23   Patient will walk >/= 220 feet for the to improve lower extremity strength/endurance to facilitate safe ambulation around home and community  Baseline: 130ft Goal status: INITIAL  2.  Patient will score a  on the  >/= 53 on the FOTO  to demonstrate an improvement in ADL completion, stair negotiation, household/community ambulation, and self-care Baseline:  Goal status: INITIAL  3. Patient will be independent with a basic stretching/strengthening HEP  Baseline:  Goal status: INITIAL   LONG TERM GOALS: Target date: 08/18/2023    Patient will walk >/= 250 feet for the to improve lower extremity strength/endurance to  facilitate safe ambulation around home and community  Baseline: 156ft Goal status: INITIAL  2.  Patient will demo right knee active range of motion into both flexion/ extension to [facility] to facilitate ADL completion, stairs, squatting.   Baseline: see above  Goal status: INITIAL  3.  Patient will be independent with a comprehensive strengthening HEP  Baseline:  Goal status: INITIAL  --------------------------------------------------------------------------------------------- PLAN:  PT FREQUENCY: 2x/week  PT DURATION: 6 weeks  PLANNED INTERVENTIONS: Therapeutic exercises, Therapeutic activity, Neuromuscular re-education, Balance training, Gait training, Patient/Family education, Self Care, and Joint mobilization.  PLAN FOR NEXT SESSION: Progress right knee active  range of motion/ strength    Seymour Bars, PT 07/09/2023, 10:39 AM

## 2023-07-13 ENCOUNTER — Ambulatory Visit (HOSPITAL_COMMUNITY): Payer: Medicare PPO

## 2023-07-13 DIAGNOSIS — Z96651 Presence of right artificial knee joint: Secondary | ICD-10-CM

## 2023-07-13 DIAGNOSIS — R262 Difficulty in walking, not elsewhere classified: Secondary | ICD-10-CM

## 2023-07-13 DIAGNOSIS — G8929 Other chronic pain: Secondary | ICD-10-CM | POA: Diagnosis not present

## 2023-07-13 DIAGNOSIS — M1711 Unilateral primary osteoarthritis, right knee: Secondary | ICD-10-CM | POA: Diagnosis not present

## 2023-07-13 DIAGNOSIS — M25561 Pain in right knee: Secondary | ICD-10-CM | POA: Diagnosis not present

## 2023-07-13 NOTE — Therapy (Signed)
OUTPATIENT PHYSICAL THERAPY Treatment   Patient Name: Wendy Newman MRN: [id number] DOB:[birth year], 82 y.o., female Today's Date: 07/13/2023  END OF SESSION:   PT End of Session - 07/13/23 1152     Visit Number 3    Number of Visits 16    Date for PT Re-Evaluation 08/18/23    Authorization Type [employer]    Authorization Time Period 12 visits until 08/18/23    Authorization - Visit Number 3    Authorization - Number of Visits 12    Progress Note Due on Visit 12    PT Start Time 1146    PT Stop Time 1226    PT Time Calculation (min) 40 min    Activity Tolerance Patient limited by pain    Behavior During Therapy WFL for tasks assessed/performed              Past Medical History:  Diagnosis Date   Anxiety    Cataract    DVT (deep venous thrombosis) (HCC)    Edema of both legs    Essential hypertension    Osteoporosis    PONV (postoperative nausea and vomiting)    Vitamin D deficiency    Past Surgical History:  Procedure Laterality Date   ABDOMINAL HYSTERECTOMY     Complete   CATARACT EXTRACTION Bilateral    CESAREAN SECTION     X 3   COLONOSCOPY  10/10/2008   [id number]   COLONOSCOPY WITH PROPOFOL N/A 12/21/2014   Dr. Rourk:noncompliant left colon and redundant right colon, rectal and colonic mucosa appeared normal.    TOTAL KNEE ARTHROPLASTY Right 06/16/2023   Procedure: TOTAL KNEE ARTHROPLASTY;  Surgeon: Vickki Hearing, MD;  Location: [facility];  Service: Orthopedics;  Laterality: Right;   Patient Active Problem List   Diagnosis Date Noted   Nausea & vomiting 06/17/2023   Osteoarthritis of right knee 06/16/2023   Acute deep vein thrombosis (DVT) of popliteal vein of left lower extremity (HCC) 10/02/2021   Allergic rhinoconjunctivitis 07/02/2017   Family history of colonic polyps 11/28/2014   Family history of colon cancer 11/28/2014   Constipation 11/28/2014   Vitamin D deficiency 06/16/2013   Hypertension    Anxiety    Osteoporosis     Cataract    Hyperlipemia 05/18/2009   Anxiety state 05/18/2009   NECK PAIN 05/18/2009   ALLERGIC RHINITIS 08/28/2008   OSTEOARTHRITIS 08/28/2008   POSTMENOPAUSAL OSTEOPOROSIS 08/28/2008   PAT 04/24/2008   OTHER ABNORMAL BLOOD CHEMISTRY 04/24/2008   Essential hypertension 12/16/2007   HEADACHE 12/16/2007    PCP: Donita Brooks, MDRef Provider (PCP)   REFERRING PROVIDER:   Vickki Hearing, MD    REFERRING DIAG: M17.11 (ICD-10-CM) - Unilateral primary osteoarthritis, right knee   Rationale for Evaluation and Treatment: Rehabilitation  THERAPY DIAG:  Difficulty in walking, not elsewhere classified  Chronic pain of right knee  Status post total right knee replacement  ONSET DATE: right TKR August 20/2024 --------------------------------------------------------------------------------------------- SUBJECTIVE:  SUBJECTIVE STATEMENT: 4/10 pain and soreness right knee; more stiff with damp weather; walking some at home without her cane  IE: Patient underwent right TKR 06/16/23. Patient stayed in hospital for 2 nights and was discharged to home PT for 3x/wk for 2 weeks. Patient was walking at home and stubbed two toes on left lower extremity and fractured two toes right after surgery; MD did nothing to left lower extremity toes and will heal on its own. Pt presents to OPPT.   PERTINENT HISTORY:  HTN  PAIN:  Are you having pain? Yes: NPRS scale: 3/10 Pain location: right knee Pain description: dull/achy Aggravating factors: walking, standing, bending  Relieving factors: rest  PRECAUTIONS: None  RED FLAGS: None   WEIGHT BEARING RESTRICTIONS: No  FALLS:  Has patient fallen in last 6 months? No  LIVING ENVIRONMENT: Lives with: lives alone Lives in: House/apartment Stairs: No Has  following equipment at home: Single point cane and Environmental consultant - 2 wheeled  OCCUPATION: retired   PLOF: Independent  PATIENT GOALS: to walk pain free  NEXT MD VISIT: October 3 --------------------------------------------------------------------------------------------- OBJECTIVE:   DIAGNOSTIC FINDINGS:  XR:  Narrative & Impression  CLINICAL DATA:  Postop right knee replacement.   EXAM: PORTABLE RIGHT KNEE - 1-2 VIEW   COMPARISON:  None Available.   FINDINGS: Right knee arthroplasty in expected alignment. No periprosthetic lucency or fracture. There has been patellar resurfacing. Recent postsurgical change includes air and edema in the soft tissues and joint space. Anterior skin staples in place.   IMPRESSION: Right knee arthroplasty without immediate postoperative complication    PATIENT SURVEYS:  FOTO [phone]  SCREENING FOR RED FLAGS: Bowel or bladder incontinence: No Spinal tumors: No Cauda equina syndrome: No Compression fracture: No Abdominal aneurysm: No  COGNITION: Overall cognitive status: Within functional limits for tasks assessed  POSTURE: No Significant postural limitations      FUNCTIONAL TESTS:  2 minute walk test: 190 ft   GAIT ANALYSIS: Distance walked: 133feet Assistive device utilized: Single point cane Level of assistance: SBA Comments: Patient with moderate antalgia; maintains right knee in extension during stance phase  SENSATION: WFL    LOWER EXTREMITY MMT:    MMT Right eval  Hip flexion   Hip extension   Hip abduction   Hip adduction   Hip internal rotation   Hip external rotation   Knee flexion 3-/5  Knee extension 3-/5  Ankle dorsiflexion   Ankle plantarflexion   Ankle inversion   Ankle eversion    (Blank rows = not tested)  LOWER EXTREMITY ROM:     Active / PROM  Right eval Right 07/13/23  Hip flexion    Hip extension    Hip abduction    Hip adduction    Hip internal rotation    Hip external rotation     Knee flexion A:0-75* P: 0-90* 111 AROM  Knee extension A:(5) P:0* -5 AROM; 0 AAROM  Ankle dorsiflexion    Ankle plantarflexion    Ankle inversion    Ankle eversion     (Blank rows = not tested)  LOWER EXTREMITY SPECIAL TESTS Knee special tests: not indicated  PALPATION: Moderate tenderness to palpation right knee incision; moderate ms guarding  INTEGUMENTARY   Incision CDI; moderate swelling. 1+ pitting edema on right knee --------------------------------------------------------------------------------------------- TODAY'S TREATMENT:  DATE:  07/13/23 Bike seat 12 half revolutions for mobility x 5'  STM and man ROM to right knee x 10' to decrease pain, increase tissue and joint mobility  Standing: Heel/toe raises 2 x 10 12" box knee drives for flexion x 2' Squats to chair x 10 TKE's RTB x 20 Hip abduction  2 x 10 each    07/09/23 Manual therapy x20' -soft tissue mobilization to right knee for blood circulation/ edema management  -PROM to right knee with end range oscillations Therapeutic exercise x20'  -Supine right quad sets with foam roller @ ankle 20x3" -Supine SAQs 1# 2x10  -Seated LAQs with 1# 2x10   -Cold pack to right knee, elevated, 5' end of session     07/07/23 Therapeutic exercise x10' -Supine right quad sets 3-5" holds  -Supine heel slides x10    PATIENT EDUCATION:  Education details: HEP, pain management  Person educated: Patient Education method: Explanation and Demonstration Education comprehension: verbalized understanding and returned demonstration  HOME EXERCISE PROGRAM: Access Code: [email or web address] URL: [email or web address] Date: 07/07/2023 Prepared by: Seymour Bars  Exercises - Supine Quad Set  - 7 x weekly - 10-20 reps - 3secs hold - Supine Heel Slide  - 7 x weekly - 10-20 reps - Seated Long Arc Quad   - 7 x weekly - 10-20 reps --------------------------------------------------------------------------------------------- ASSESSMENT:  CLINICAL IMPRESSION: Today's session continued with focus on right knee mobility and initiated some standing exercise today with good tolerance. Added bike for mobility.  Good improvement with knee flexion today although unable to make full revolution on bike.  Able to AA get full right knee extension.   Pt will continue to benefit from PT to return to prior level of function    IE: Patient is a 82 y.o. y.o. female who was seen today for physical therapy evaluation and treatment for s/p right TKR 06/16/23. Patient presents to PT with the following objective impairments: Abnormal gait, decreased activity tolerance, decreased balance, difficulty walking, decreased ROM, decreased strength, improper body mechanics, and pain. These impairments limit the patient in activities such as carrying, lifting, bending, standing, squatting, stairs, bed mobility, bathing, toileting, and locomotion level. These impairments also limit the patient in participation such as meal prep, cleaning, laundry, personal finances, interpersonal relationship, driving, shopping, community activity, yard work, and church. The patient will benefit from PT to address the limitations/impairments listed below to return to their prior level of function in the domains of activity and participation.    PERSONAL FACTORS:  n/a  are also affecting patient's functional outcome.   REHAB POTENTIAL: Good  CLINICAL DECISION MAKING: Stable/uncomplicated  EVALUATION COMPLEXITY: Low  ----------------------------------------------------------------------------------------- GOALS: Goals reviewed with patient? No  SHORT TERM GOALS: Target date: 07/13/23   Patient will walk >/= 220 feet for the to improve lower extremity strength/endurance to facilitate safe ambulation around home and community  Baseline:  141ft Goal status: INITIAL  2.  Patient will score a  on the  >/= 53 on the FOTO  to demonstrate an improvement in ADL completion, stair negotiation, household/community ambulation, and self-care Baseline:  Goal status: INITIAL  3. Patient will be independent with a basic stretching/strengthening HEP  Baseline:  Goal status: INITIAL   LONG TERM GOALS: Target date: 08/18/2023    Patient will walk >/= 250 feet for the to improve lower extremity strength/endurance to facilitate safe ambulation around home and community  Baseline: 127ft Goal status: INITIAL  2.  Patient will demo right knee active range of motion  into both flexion/ extension to [facility] to facilitate ADL completion, stairs, squatting.   Baseline: see above  Goal status: INITIAL  3.  Patient will be independent with a comprehensive strengthening HEP  Baseline:  Goal status: INITIAL  --------------------------------------------------------------------------------------------- PLAN:  PT FREQUENCY: 2x/week  PT DURATION: 6 weeks  PLANNED INTERVENTIONS: Therapeutic exercises, Therapeutic activity, Neuromuscular re-education, Balance training, Gait training, Patient/Family education, Self Care, and Joint mobilization.  PLAN FOR NEXT SESSION: Progress right knee active range of motion/ strength    12:25 PM, 07/13/23 Sundi Slevin Small Corene Resnick MPT [place] [facility] [place] [phone]

## 2023-07-15 ENCOUNTER — Ambulatory Visit (HOSPITAL_COMMUNITY): Payer: Medicare PPO | Admitting: Physical Therapy

## 2023-07-15 DIAGNOSIS — Z96651 Presence of right artificial knee joint: Secondary | ICD-10-CM | POA: Diagnosis not present

## 2023-07-15 DIAGNOSIS — R262 Difficulty in walking, not elsewhere classified: Secondary | ICD-10-CM | POA: Diagnosis not present

## 2023-07-15 DIAGNOSIS — M25561 Pain in right knee: Secondary | ICD-10-CM | POA: Diagnosis not present

## 2023-07-15 DIAGNOSIS — G8929 Other chronic pain: Secondary | ICD-10-CM

## 2023-07-15 DIAGNOSIS — M1711 Unilateral primary osteoarthritis, right knee: Secondary | ICD-10-CM | POA: Diagnosis not present

## 2023-07-15 NOTE — Therapy (Signed)
OUTPATIENT PHYSICAL THERAPY Treatment   Patient Name: Wendy Newman MRN: [id number] DOB:Jul 10, 1941, 82 y.o., female Today's Date: 07/15/2023  END OF SESSION:   PT End of Session - 07/15/23 1038     Visit Number 4    Number of Visits 16    Date for PT Re-Evaluation 08/18/23    Authorization Type [employer]    Authorization Time Period 12 visits until 08/18/23    Authorization - Visit Number 4    Authorization - Number of Visits 12    Progress Note Due on Visit 12    PT Start Time 1020    PT Stop Time 1100    PT Time Calculation (min) 40 min    Activity Tolerance Patient limited by pain    Behavior During Therapy WFL for tasks assessed/performed              Past Medical History:  Diagnosis Date   Anxiety    Cataract    DVT (deep venous thrombosis) (HCC)    Edema of both legs    Essential hypertension    Osteoporosis    PONV (postoperative nausea and vomiting)    Vitamin D deficiency    Past Surgical History:  Procedure Laterality Date   ABDOMINAL HYSTERECTOMY     Complete   CATARACT EXTRACTION Bilateral    CESAREAN SECTION     X 3   COLONOSCOPY  10/10/2008   GMW:[id number]   COLONOSCOPY WITH PROPOFOL N/A 12/21/2014   Dr. Rourk:noncompliant left colon and redundant right colon, rectal and colonic mucosa appeared normal.    TOTAL KNEE ARTHROPLASTY Right 06/16/2023   Procedure: TOTAL KNEE ARTHROPLASTY;  Surgeon: Vickki Hearing, MD;  Location: [facility];  Service: Orthopedics;  Laterality: Right;   Patient Active Problem List   Diagnosis Date Noted   Nausea & vomiting 06/17/2023   Osteoarthritis of right knee 06/16/2023   Acute deep vein thrombosis (DVT) of popliteal vein of left lower extremity (HCC) 10/02/2021   Allergic rhinoconjunctivitis 07/02/2017   Family history of colonic polyps 11/28/2014   Family history of colon cancer 11/28/2014   Constipation 11/28/2014   Vitamin D deficiency 06/16/2013   Hypertension    Anxiety    Osteoporosis     Cataract    Hyperlipemia 05/18/2009   Anxiety state 05/18/2009   NECK PAIN 05/18/2009   ALLERGIC RHINITIS 08/28/2008   OSTEOARTHRITIS 08/28/2008   POSTMENOPAUSAL OSTEOPOROSIS 08/28/2008   PAT 04/24/2008   OTHER ABNORMAL BLOOD CHEMISTRY 04/24/2008   Essential hypertension 12/16/2007   HEADACHE 12/16/2007    PCP: Donita Brooks, MDRef Provider (PCP)   REFERRING PROVIDER:   Vickki Hearing, MD    REFERRING DIAG: M17.11 (ICD-10-CM) - Unilateral primary osteoarthritis, right knee   Rationale for Evaluation and Treatment: Rehabilitation  THERAPY DIAG:  Difficulty in walking, not elsewhere classified  Status post total right knee replacement  Chronic pain of right knee  ONSET DATE: right TKR August 20/2024 --------------------------------------------------------------------------------------------- SUBJECTIVE:  SUBJECTIVE STATEMENT: Pt reports pain is a little less than last session but still there.    IE: Patient underwent right TKR 06/16/23. Patient stayed in hospital for 2 nights and was discharged to home PT for 3x/wk for 2 weeks. Patient was walking at home and stubbed two toes on left lower extremity and fractured two toes right after surgery; MD did nothing to left lower extremity toes and will heal on its own. Pt presents to OPPT.   PERTINENT HISTORY:  HTN  PAIN:  Are you having pain? Yes: NPRS scale: 3/10 Pain location: right knee Pain description: dull/achy Aggravating factors: walking, standing, bending  Relieving factors: rest  PRECAUTIONS: None  RED FLAGS: None   WEIGHT BEARING RESTRICTIONS: No  FALLS:  Has patient fallen in last 6 months? No  LIVING ENVIRONMENT: Lives with: lives alone Lives in: House/apartment Stairs: No Has following equipment at home:  Single point cane and Environmental consultant - 2 wheeled  OCCUPATION: retired   PLOF: Independent  PATIENT GOALS: to walk pain free  NEXT MD VISIT: October 3 --------------------------------------------------------------------------------------------- OBJECTIVE:   DIAGNOSTIC FINDINGS:  XR:  Narrative & Impression  CLINICAL DATA:  Postop right knee replacement.   EXAM: PORTABLE RIGHT KNEE - 1-2 VIEW   COMPARISON:  None Available.   FINDINGS: Right knee arthroplasty in expected alignment. No periprosthetic lucency or fracture. There has been patellar resurfacing. Recent postsurgical change includes air and edema in the soft tissues and joint space. Anterior skin staples in place.   IMPRESSION: Right knee arthroplasty without immediate postoperative complication    PATIENT SURVEYS:  FOTO [phone]  SCREENING FOR RED FLAGS: Bowel or bladder incontinence: No Spinal tumors: No Cauda equina syndrome: No Compression fracture: No Abdominal aneurysm: No  COGNITION: Overall cognitive status: Within functional limits for tasks assessed  POSTURE: No Significant postural limitations      FUNCTIONAL TESTS:  2 minute walk test: 190 ft   GAIT ANALYSIS: Distance walked: 117feet Assistive device utilized: Single point cane Level of assistance: SBA Comments: Patient with moderate antalgia; maintains right knee in extension during stance phase  SENSATION: WFL    LOWER EXTREMITY MMT:    MMT Right eval  Hip flexion   Hip extension   Hip abduction   Hip adduction   Hip internal rotation   Hip external rotation   Knee flexion 3-/5  Knee extension 3-/5  Ankle dorsiflexion   Ankle plantarflexion   Ankle inversion   Ankle eversion    (Blank rows = not tested)  LOWER EXTREMITY ROM:     Active / PROM  Right eval Right 07/13/23  Hip flexion    Hip extension    Hip abduction    Hip adduction    Hip internal rotation    Hip external rotation    Knee flexion A:0-75* P: 0-90*  111 AROM  Knee extension A:(5) P:0* -5 AROM; 0 AAROM  Ankle dorsiflexion    Ankle plantarflexion    Ankle inversion    Ankle eversion     (Blank rows = not tested)  LOWER EXTREMITY SPECIAL TESTS Knee special tests: not indicated  PALPATION: Moderate tenderness to palpation right knee incision; moderate ms guarding  INTEGUMENTARY   Incision CDI; moderate swelling. 1+ pitting edema on right knee --------------------------------------------------------------------------------------------- TODAY'S TREATMENT:  DATE:  07/15/23 Bike seat 12 rocking for mobility x 5' standing: Heel/toe raises 2 x 10 12" box knee drives for flexion x 10X10" holds Hamstring stretch 12" Lt step 3X30" Squats to chair x 10 Hip abduction  2 x 10 each Hip extensions 2X10 each Slant board stretch 3X30" Manual scar release AROM 112 degrees flexion   07/13/23 Bike seat 12 half revolutions for mobility x 5'  STM and man ROM to right knee x 10' to decrease pain, increase tissue and joint mobility  Standing: Heel/toe raises 2 x 10 12" box knee drives for flexion x 2' Squats to chair x 10 TKE's RTB x 20 Hip abduction  2 x 10 each    07/09/23 Manual therapy x20' -soft tissue mobilization to right knee for blood circulation/ edema management  -PROM to right knee with end range oscillations Therapeutic exercise x20'  -Supine right quad sets with foam roller @ ankle 20x3" -Supine SAQs 1# 2x10  -Seated LAQs with 1# 2x10   -Cold pack to right knee, elevated, 5' end of session     07/07/23 Therapeutic exercise x10' -Supine right quad sets 3-5" holds  -Supine heel slides x10    PATIENT EDUCATION:  Education details: HEP, pain management  Person educated: Patient Education method: Explanation and Demonstration Education comprehension: verbalized understanding and returned  demonstration  HOME EXERCISE PROGRAM: Access Code: [email or web address] URL: [email or web address] Date: 07/07/2023 Prepared by: Seymour Bars  Exercises - Supine Quad Set  - 7 x weekly - 10-20 reps - 3secs hold - Supine Heel Slide  - 7 x weekly - 10-20 reps - Seated Long Arc Quad  - 7 x weekly - 10-20 reps --------------------------------------------------------------------------------------------- ASSESSMENT:  CLINICAL IMPRESSION: Today's session continued with focus on right knee mobility.  Pt still unable to make full rev on bike, even with therapist help (pt resisted but has 112 degrees flexion AROM).  Educated on need to be more aggressive with self ROM, completing more often at home and pushing a little harder.  Pt appears to have low pain tolerance and still ambulating with stiff knee.  Worked on improving gait quality as well this session. Scar massage completed with overall good tolerance.  Most tightness located distally. Instructed to complete this at home as well.  AROM flexion at 112 today in supine.  Pt will continue to benefit from PT to return to prior level of function   IE: Patient is a 82 y.o. y.o. female who was seen today for physical therapy evaluation and treatment for s/p right TKR 06/16/23. Patient presents to PT with the following objective impairments: Abnormal gait, decreased activity tolerance, decreased balance, difficulty walking, decreased ROM, decreased strength, improper body mechanics, and pain. These impairments limit the patient in activities such as carrying, lifting, bending, standing, squatting, stairs, bed mobility, bathing, toileting, and locomotion level. These impairments also limit the patient in participation such as meal prep, cleaning, laundry, personal finances, interpersonal relationship, driving, shopping, community activity, yard work, and church. The patient will benefit from PT to address the limitations/impairments listed below to return to  their prior level of function in the domains of activity and participation.    PERSONAL FACTORS:  n/a  are also affecting patient's functional outcome.   REHAB POTENTIAL: Good  CLINICAL DECISION MAKING: Stable/uncomplicated  EVALUATION COMPLEXITY: Low  ----------------------------------------------------------------------------------------- GOALS: Goals reviewed with patient? No  SHORT TERM GOALS: Target date: 07/15/23   Patient will walk >/= 220 feet for the to improve lower extremity strength/endurance to facilitate  safe ambulation around home and community  Baseline: 176ft Goal status: INITIAL  2.  Patient will score a  on the  >/= 53 on the FOTO  to demonstrate an improvement in ADL completion, stair negotiation, household/community ambulation, and self-care Baseline:  Goal status: INITIAL  3. Patient will be independent with a basic stretching/strengthening HEP  Baseline:  Goal status: INITIAL   LONG TERM GOALS: Target date: 08/18/2023    Patient will walk >/= 250 feet for the to improve lower extremity strength/endurance to facilitate safe ambulation around home and community  Baseline: 129ft Goal status: INITIAL  2.  Patient will demo right knee active range of motion into both flexion/ extension to [facility] to facilitate ADL completion, stairs, squatting.   Baseline: see above  Goal status: INITIAL  3.  Patient will be independent with a comprehensive strengthening HEP  Baseline:  Goal status: INITIAL  --------------------------------------------------------------------------------------------- PLAN:  PT FREQUENCY: 2x/week  PT DURATION: 6 weeks  PLANNED INTERVENTIONS: Therapeutic exercises, Therapeutic activity, Neuromuscular re-education, Balance training, Gait training, Patient/Family education, Self Care, and Joint mobilization.  PLAN FOR NEXT SESSION: Progress right knee active range of motion/ strength    10:40 AM, 07/15/23 Lurena Nida,  PTA/CLT [facility] Ph: [phone]

## 2023-07-20 ENCOUNTER — Ambulatory Visit (HOSPITAL_COMMUNITY): Payer: Medicare PPO

## 2023-07-20 DIAGNOSIS — M25561 Pain in right knee: Secondary | ICD-10-CM | POA: Diagnosis not present

## 2023-07-20 DIAGNOSIS — Z96651 Presence of right artificial knee joint: Secondary | ICD-10-CM

## 2023-07-20 DIAGNOSIS — R262 Difficulty in walking, not elsewhere classified: Secondary | ICD-10-CM | POA: Diagnosis not present

## 2023-07-20 DIAGNOSIS — M1711 Unilateral primary osteoarthritis, right knee: Secondary | ICD-10-CM | POA: Diagnosis not present

## 2023-07-20 DIAGNOSIS — G8929 Other chronic pain: Secondary | ICD-10-CM

## 2023-07-20 NOTE — Therapy (Signed)
OUTPATIENT PHYSICAL THERAPY Treatment   Patient Name: Wendy Newman MRN: [id number] DOB:[birth year], 82 y.o., female Today's Date: 07/20/2023  END OF SESSION:   PT End of Session - 07/20/23 1148     Visit Number 5    Number of Visits 16    Date for PT Re-Evaluation 08/18/23    Authorization Type [employer]    Authorization Time Period 12 visits until 08/18/23    Authorization - Number of Visits 12    Progress Note Due on Visit 12    PT Start Time 1145    PT Stop Time 1225    PT Time Calculation (min) 40 min    Activity Tolerance Patient limited by pain    Behavior During Therapy WFL for tasks assessed/performed              Past Medical History:  Diagnosis Date   Anxiety    Cataract    DVT (deep venous thrombosis) (HCC)    Edema of both legs    Essential hypertension    Osteoporosis    PONV (postoperative nausea and vomiting)    Vitamin D deficiency    Past Surgical History:  Procedure Laterality Date   ABDOMINAL HYSTERECTOMY     Complete   CATARACT EXTRACTION Bilateral    CESAREAN SECTION     X 3   COLONOSCOPY  10/10/2008   ACZ:[id number]   COLONOSCOPY WITH PROPOFOL N/A 12/21/2014   Dr. Rourk:noncompliant left colon and redundant right colon, rectal and colonic mucosa appeared normal.    TOTAL KNEE ARTHROPLASTY Right 06/16/2023   Procedure: TOTAL KNEE ARTHROPLASTY;  Surgeon: Vickki Hearing, MD;  Location: [facility];  Service: Orthopedics;  Laterality: Right;   Patient Active Problem List   Diagnosis Date Noted   Nausea & vomiting 06/17/2023   Osteoarthritis of right knee 06/16/2023   Acute deep vein thrombosis (DVT) of popliteal vein of left lower extremity (HCC) 10/02/2021   Allergic rhinoconjunctivitis 07/02/2017   Family history of colonic polyps 11/28/2014   Family history of colon cancer 11/28/2014   Constipation 11/28/2014   Vitamin D deficiency 06/16/2013   Hypertension    Anxiety    Osteoporosis    Cataract    Hyperlipemia  05/18/2009   Anxiety state 05/18/2009   NECK PAIN 05/18/2009   ALLERGIC RHINITIS 08/28/2008   OSTEOARTHRITIS 08/28/2008   POSTMENOPAUSAL OSTEOPOROSIS 08/28/2008   PAT 04/24/2008   OTHER ABNORMAL BLOOD CHEMISTRY 04/24/2008   Essential hypertension 12/16/2007   HEADACHE 12/16/2007    PCP: Donita Brooks, MDRef Provider (PCP)   REFERRING PROVIDER:   Vickki Hearing, MD    REFERRING DIAG: M17.11 (ICD-10-CM) - Unilateral primary osteoarthritis, right knee   Rationale for Evaluation and Treatment: Rehabilitation  THERAPY DIAG:  Difficulty in walking, not elsewhere classified  Status post total right knee replacement  Chronic pain of right knee  ONSET DATE: right TKR August 20/2024 --------------------------------------------------------------------------------------------- SUBJECTIVE:  SUBJECTIVE STATEMENT: Pt with no new reports; states noticing a decrease in swelling    IE: Patient underwent right TKR 06/16/23. Patient stayed in hospital for 2 nights and was discharged to home PT for 3x/wk for 2 weeks. Patient was walking at home and stubbed two toes on left lower extremity and fractured two toes right after surgery; MD did nothing to left lower extremity toes and will heal on its own. Pt presents to OPPT.   PERTINENT HISTORY:  HTN  PAIN:  Are you having pain? Yes: NPRS scale: 2-3/10 Pain location: right knee Pain description: dull/achy Aggravating factors: walking, standing, bending  Relieving factors: rest  PRECAUTIONS: None  RED FLAGS: None   WEIGHT BEARING RESTRICTIONS: No  FALLS:  Has patient fallen in last 6 months? No  LIVING ENVIRONMENT: Lives with: lives alone Lives in: House/apartment Stairs: No Has following equipment at home: Single point cane and Environmental consultant - 2  wheeled  OCCUPATION: retired   PLOF: Independent  PATIENT GOALS: to walk pain free  NEXT MD VISIT: October 3 --------------------------------------------------------------------------------------------- OBJECTIVE:   DIAGNOSTIC FINDINGS:  XR:  Narrative & Impression  CLINICAL DATA:  Postop right knee replacement.   EXAM: PORTABLE RIGHT KNEE - 1-2 VIEW   COMPARISON:  None Available.   FINDINGS: Right knee arthroplasty in expected alignment. No periprosthetic lucency or fracture. There has been patellar resurfacing. Recent postsurgical change includes air and edema in the soft tissues and joint space. Anterior skin staples in place.   IMPRESSION: Right knee arthroplasty without immediate postoperative complication    PATIENT SURVEYS:  FOTO [phone]  SCREENING FOR RED FLAGS: Bowel or bladder incontinence: No Spinal tumors: No Cauda equina syndrome: No Compression fracture: No Abdominal aneurysm: No  COGNITION: Overall cognitive status: Within functional limits for tasks assessed  POSTURE: No Significant postural limitations      FUNCTIONAL TESTS:  2 minute walk test: 190 ft   GAIT ANALYSIS: Distance walked: 146feet Assistive device utilized: Single point cane Level of assistance: SBA Comments: Patient with moderate antalgia; maintains right knee in extension during stance phase  SENSATION: WFL    LOWER EXTREMITY MMT:    MMT Right eval  Hip flexion   Hip extension   Hip abduction   Hip adduction   Hip internal rotation   Hip external rotation   Knee flexion 3-/5  Knee extension 3-/5  Ankle dorsiflexion   Ankle plantarflexion   Ankle inversion   Ankle eversion    (Blank rows = not tested)  LOWER EXTREMITY ROM:     Active / PROM  Right eval Right 07/13/23  Hip flexion    Hip extension    Hip abduction    Hip adduction    Hip internal rotation    Hip external rotation    Knee flexion A:0-75* P: 0-90* 111 AROM  Knee extension A:(5)  P:0* -5 AROM; 0 AAROM  Ankle dorsiflexion    Ankle plantarflexion    Ankle inversion    Ankle eversion     (Blank rows = not tested)  LOWER EXTREMITY SPECIAL TESTS Knee special tests: not indicated  PALPATION: Moderate tenderness to palpation right knee incision; moderate ms guarding  INTEGUMENTARY   Incision CDI; moderate swelling. 1+ pitting edema on right knee --------------------------------------------------------------------------------------------- TODAY'S TREATMENT:  DATE:   07/20/23 Therapeutic exercise x40'  -Supine SAQs with 3# 2x10, bilateral  -Prolonged stretch into extension supine using 2x3# weights x2'  -Seated LAQS with 3# with 2x10  -Squats to hi/lo mat x 10 -Slant board stretch 3X30" -Seated HS Curl machine with 30# 2x10 -NuStep Seat 10, L2 cooldown     07/15/23 Bike seat 12 rocking for mobility x 5' standing: Heel/toe raises 2 x 10 12" box knee drives for flexion x 10X10" holds Hamstring stretch 12" Lt step 3X30" Squats to chair x 10 Hip abduction  2 x 10 each Hip extensions 2X10 each Slant board stretch 3X30" Manual scar release AROM 112 degrees flexion   07/13/23 Bike seat 12 half revolutions for mobility x 5'  STM and man ROM to right knee x 10' to decrease pain, increase tissue and joint mobility  Standing: Heel/toe raises 2 x 10 12" box knee drives for flexion x 2' Squats to chair x 10 TKE's RTB x 20 Hip abduction  2 x 10 each    07/09/23 Manual therapy x20' -soft tissue mobilization to right knee for blood circulation/ edema management  -PROM to right knee with end range oscillations Therapeutic exercise x20'  -Supine right quad sets with foam roller @ ankle 20x3" -Supine SAQs 1# 2x10  -Seated LAQs with 1# 2x10   -Cold pack to right knee, elevated, 5' end of session     07/07/23 Therapeutic exercise  x10' -Supine right quad sets 3-5" holds  -Supine heel slides x10    PATIENT EDUCATION:  Education details: HEP, pain management  Person educated: Patient Education method: Explanation and Demonstration Education comprehension: verbalized understanding and returned demonstration  HOME EXERCISE PROGRAM: Access Code: [email or web address] URL: [email or web address] Date: 07/07/2023 Prepared by: Seymour Bars  Exercises - Supine Quad Set  - 7 x weekly - 10-20 reps - 3secs hold - Supine Heel Slide  - 7 x weekly - 10-20 reps - Seated Long Arc Quad  - 7 x weekly - 10-20 reps --------------------------------------------------------------------------------------------- ASSESSMENT:  CLINICAL IMPRESSION: Today's session continued with focus on right knee mobility. Session focused on Knee Ext active range of motion with resistance followed by knee flexion therapeutic exercise. Patient tolerated well with no increase in pain levels.  Pt will continue to benefit from PT to return to prior level of function   IE: Patient is a 81 y.o. y.o. female who was seen today for physical therapy evaluation and treatment for s/p right TKR 06/16/23. Patient presents to PT with the following objective impairments: Abnormal gait, decreased activity tolerance, decreased balance, difficulty walking, decreased ROM, decreased strength, improper body mechanics, and pain. These impairments limit the patient in activities such as carrying, lifting, bending, standing, squatting, stairs, bed mobility, bathing, toileting, and locomotion level. These impairments also limit the patient in participation such as meal prep, cleaning, laundry, personal finances, interpersonal relationship, driving, shopping, community activity, yard work, and church. The patient will benefit from PT to address the limitations/impairments listed below to return to their prior level of function in the domains of activity and participation.     PERSONAL FACTORS:  n/a  are also affecting patient's functional outcome.   REHAB POTENTIAL: Good  CLINICAL DECISION MAKING: Stable/uncomplicated  EVALUATION COMPLEXITY: Low  ----------------------------------------------------------------------------------------- GOALS: Goals reviewed with patient? No  SHORT TERM GOALS: Target date: 07/20/23   Patient will walk >/= 220 feet for the to improve lower extremity strength/endurance to facilitate safe ambulation around home and community  Baseline: 120ft Goal status: INITIAL  2.  Patient will score a  on the  >/= 53 on the FOTO  to demonstrate an improvement in ADL completion, stair negotiation, household/community ambulation, and self-care Baseline:  Goal status: INITIAL  3. Patient will be independent with a basic stretching/strengthening HEP  Baseline:  Goal status: INITIAL   LONG TERM GOALS: Target date: 08/18/2023    Patient will walk >/= 250 feet for the to improve lower extremity strength/endurance to facilitate safe ambulation around home and community  Baseline: 119ft Goal status: INITIAL  2.  Patient will demo right knee active range of motion into both flexion/ extension to [facility] to facilitate ADL completion, stairs, squatting.   Baseline: see above  Goal status: INITIAL  3.  Patient will be independent with a comprehensive strengthening HEP  Baseline:  Goal status: INITIAL  --------------------------------------------------------------------------------------------- PLAN:  PT FREQUENCY: 2x/week  PT DURATION: 6 weeks  PLANNED INTERVENTIONS: Therapeutic exercises, Therapeutic activity, Neuromuscular re-education, Balance training, Gait training, Patient/Family education, Self Care, and Joint mobilization.  PLAN FOR NEXT SESSION: Progress right knee active range of motion/ strength    11:49 AM, 07/20/23 Seymour Bars PT, DPT  [facility] Ph:  [phone]

## 2023-07-22 ENCOUNTER — Ambulatory Visit (HOSPITAL_COMMUNITY): Payer: Medicare PPO

## 2023-07-22 DIAGNOSIS — M1711 Unilateral primary osteoarthritis, right knee: Secondary | ICD-10-CM | POA: Diagnosis not present

## 2023-07-22 DIAGNOSIS — M25561 Pain in right knee: Secondary | ICD-10-CM | POA: Diagnosis not present

## 2023-07-22 DIAGNOSIS — R262 Difficulty in walking, not elsewhere classified: Secondary | ICD-10-CM | POA: Diagnosis not present

## 2023-07-22 DIAGNOSIS — Z96651 Presence of right artificial knee joint: Secondary | ICD-10-CM

## 2023-07-22 DIAGNOSIS — G8929 Other chronic pain: Secondary | ICD-10-CM

## 2023-07-22 NOTE — Therapy (Signed)
OUTPATIENT PHYSICAL THERAPY Treatment   Patient Name: Wendy Newman MRN: [id number] DOB:Oct 17, 1941, 82 y.o., female Today's Date: 07/22/2023  END OF SESSION:   PT End of Session - 07/22/23 1147     Visit Number 6    Number of Visits 16    Date for PT Re-Evaluation 08/18/23    Authorization Type [employer]    Authorization Time Period 12 visits until 08/18/23    Authorization - Number of Visits 12    Progress Note Due on Visit 12    PT Start Time 1145    PT Stop Time 1225    PT Time Calculation (min) 40 min    Activity Tolerance Patient limited by pain    Behavior During Therapy WFL for tasks assessed/performed              Past Medical History:  Diagnosis Date   Anxiety    Cataract    DVT (deep venous thrombosis) (HCC)    Edema of both legs    Essential hypertension    Osteoporosis    PONV (postoperative nausea and vomiting)    Vitamin D deficiency    Past Surgical History:  Procedure Laterality Date   ABDOMINAL HYSTERECTOMY     Complete   CATARACT EXTRACTION Bilateral    CESAREAN SECTION     X 3   COLONOSCOPY  10/10/2008   [id number]   COLONOSCOPY WITH PROPOFOL N/A 12/21/2014   Dr. Rourk:noncompliant left colon and redundant right colon, rectal and colonic mucosa appeared normal.    TOTAL KNEE ARTHROPLASTY Right 06/16/2023   Procedure: TOTAL KNEE ARTHROPLASTY;  Surgeon: Vickki Hearing, MD;  Location: [facility];  Service: Orthopedics;  Laterality: Right;   Patient Active Problem List   Diagnosis Date Noted   Nausea & vomiting 06/17/2023   Osteoarthritis of right knee 06/16/2023   Acute deep vein thrombosis (DVT) of popliteal vein of left lower extremity (HCC) 10/02/2021   Allergic rhinoconjunctivitis 07/02/2017   Family history of colonic polyps 11/28/2014   Family history of colon cancer 11/28/2014   Constipation 11/28/2014   Vitamin D deficiency 06/16/2013   Hypertension    Anxiety    Osteoporosis    Cataract    Hyperlipemia  05/18/2009   Anxiety state 05/18/2009   NECK PAIN 05/18/2009   ALLERGIC RHINITIS 08/28/2008   OSTEOARTHRITIS 08/28/2008   POSTMENOPAUSAL OSTEOPOROSIS 08/28/2008   PAT 04/24/2008   OTHER ABNORMAL BLOOD CHEMISTRY 04/24/2008   Essential hypertension 12/16/2007   HEADACHE 12/16/2007    PCP: Donita Brooks, MDRef Provider (PCP)   REFERRING PROVIDER:   Vickki Hearing, MD    REFERRING DIAG: M17.11 (ICD-10-CM) - Unilateral primary osteoarthritis, right knee   Rationale for Evaluation and Treatment: Rehabilitation  THERAPY DIAG:  No diagnosis found.  ONSET DATE: right TKR August 20/2024 --------------------------------------------------------------------------------------------- SUBJECTIVE:  SUBJECTIVE STATEMENT: Pt with no new reports today.   *Patient only has 9 rides left for transportation; pending update if she can get more  IE: Patient underwent right TKR 06/16/23. Patient stayed in hospital for 2 nights and was discharged to home PT for 3x/wk for 2 weeks. Patient was walking at home and stubbed two toes on left lower extremity and fractured two toes right after surgery; MD did nothing to left lower extremity toes and will heal on its own. Pt presents to OPPT.   PERTINENT HISTORY:  HTN  PAIN:  Are you having pain? Yes: NPRS scale: 2-3/10 Pain location: right knee Pain description: dull/achy Aggravating factors: walking, standing, bending  Relieving factors: rest  PRECAUTIONS: None  RED FLAGS: None   WEIGHT BEARING RESTRICTIONS: No  FALLS:  Has patient fallen in last 6 months? No  LIVING ENVIRONMENT: Lives with: lives alone Lives in: House/apartment Stairs: No Has following equipment at home: Single point cane and Environmental consultant - 2 wheeled  OCCUPATION: retired   PLOF:  Independent  PATIENT GOALS: to walk pain free  NEXT MD VISIT: July 30 2023 --------------------------------------------------------------------------------------------- OBJECTIVE:   DIAGNOSTIC FINDINGS:  XR:  Narrative & Impression  CLINICAL DATA:  Postop right knee replacement.   EXAM: PORTABLE RIGHT KNEE - 1-2 VIEW   COMPARISON:  None Available.   FINDINGS: Right knee arthroplasty in expected alignment. No periprosthetic lucency or fracture. There has been patellar resurfacing. Recent postsurgical change includes air and edema in the soft tissues and joint space. Anterior skin staples in place.   IMPRESSION: Right knee arthroplasty without immediate postoperative complication    PATIENT SURVEYS:  FOTO [phone]  SCREENING FOR RED FLAGS: Bowel or bladder incontinence: No Spinal tumors: No Cauda equina syndrome: No Compression fracture: No Abdominal aneurysm: No  COGNITION: Overall cognitive status: Within functional limits for tasks assessed  POSTURE: No Significant postural limitations      FUNCTIONAL TESTS:  2 minute walk test: 190 ft   GAIT ANALYSIS: Distance walked: 140feet Assistive device utilized: Single point cane Level of assistance: SBA Comments: Patient with moderate antalgia; maintains right knee in extension during stance phase  SENSATION: WFL    LOWER EXTREMITY MMT:    MMT Right eval  Hip flexion   Hip extension   Hip abduction   Hip adduction   Hip internal rotation   Hip external rotation   Knee flexion 3-/5  Knee extension 3-/5  Ankle dorsiflexion   Ankle plantarflexion   Ankle inversion   Ankle eversion    (Blank rows = not tested)  LOWER EXTREMITY ROM:     Active / PROM  Right eval Right 07/13/23  Hip flexion    Hip extension    Hip abduction    Hip adduction    Hip internal rotation    Hip external rotation    Knee flexion A:0-75* P: 0-90* 111 AROM  Knee extension A:(5) P:0* -5 AROM; 0 AAROM  Ankle  dorsiflexion    Ankle plantarflexion    Ankle inversion    Ankle eversion     (Blank rows = not tested)  LOWER EXTREMITY SPECIAL TESTS Knee special tests: not indicated  PALPATION: Moderate tenderness to palpation right knee incision; moderate ms guarding  INTEGUMENTARY   Incision CDI; moderate swelling. 1+ pitting edema on right knee --------------------------------------------------------------------------------------------- TODAY'S TREATMENT:  DATE:  07/22/23 Therapeutic exercise x40'  -NuStep Seat 10, L2 -Seated HS Curl machine with 40# 3x10 -Seated leg press with 30# 3x10  -Standing hip flexion, abduction with 2# in // bars 2x10 -Seated LAQs with 2# 2x10 -Seated knee extension stretch  -Slant board stretch 3X30"     07/20/23 Therapeutic exercise x40'  -Supine SAQs with 3# 2x10, bilateral  -Prolonged stretch into extension supine using 2x3# weights x2'  -Seated LAQS with 3# with 2x10  -Squats to hi/lo mat x 10 -Slant board stretch 3X30" -Seated HS Curl machine with 30# 2x10 -NuStep Seat 10, L2 cooldown     07/15/23 Bike seat 12 rocking for mobility x 5' standing: Heel/toe raises 2 x 10 12" box knee drives for flexion x 10X10" holds Hamstring stretch 12" Lt step 3X30" Squats to chair x 10 Hip abduction  2 x 10 each Hip extensions 2X10 each Slant board stretch 3X30" Manual scar release AROM 112 degrees flexion   PATIENT EDUCATION:  Education details: HEP, pain management  Person educated: Patient Education method: Explanation and Demonstration Education comprehension: verbalized understanding and returned demonstration  HOME EXERCISE PROGRAM: Access Code: [email or web address] URL: [email or web address] Date: 07/07/2023 Prepared by: Seymour Bars  Exercises - Supine Quad Set  - 7 x weekly - 10-20 reps - 3secs hold - Supine Heel  Slide  - 7 x weekly - 10-20 reps - Seated Long Arc Quad  - 7 x weekly - 10-20 reps --------------------------------------------------------------------------------------------- ASSESSMENT:  CLINICAL IMPRESSION: Today's session continued with focus on right knee mobility. Session focused on Knee Ext active range of motion with resistance followed by knee flexion therapeutic exercise. Patient tolerated well with no increase in pain levels.  Pt will continue to benefit from PT to return to prior level of function   IE: Patient is a 82 y.o. y.o. female who was seen today for physical therapy evaluation and treatment for s/p right TKR 06/16/23. Patient presents to PT with the following objective impairments: Abnormal gait, decreased activity tolerance, decreased balance, difficulty walking, decreased ROM, decreased strength, improper body mechanics, and pain. These impairments limit the patient in activities such as carrying, lifting, bending, standing, squatting, stairs, bed mobility, bathing, toileting, and locomotion level. These impairments also limit the patient in participation such as meal prep, cleaning, laundry, personal finances, interpersonal relationship, driving, shopping, community activity, yard work, and church. The patient will benefit from PT to address the limitations/impairments listed below to return to their prior level of function in the domains of activity and participation.    PERSONAL FACTORS:  n/a  are also affecting patient's functional outcome.   REHAB POTENTIAL: Good  CLINICAL DECISION MAKING: Stable/uncomplicated  EVALUATION COMPLEXITY: Low  ----------------------------------------------------------------------------------------- GOALS: Goals reviewed with patient? No  SHORT TERM GOALS: Target date: 07/22/23   Patient will walk >/= 220 feet for the to improve lower extremity strength/endurance to facilitate safe ambulation around home and community   Baseline: 1103ft Goal status: INITIAL  2.  Patient will score a  on the  >/= 53 on the FOTO  to demonstrate an improvement in ADL completion, stair negotiation, household/community ambulation, and self-care Baseline:  Goal status: INITIAL  3. Patient will be independent with a basic stretching/strengthening HEP  Baseline:  Goal status: INITIAL   LONG TERM GOALS: Target date: 08/18/2023    Patient will walk >/= 250 feet for the to improve lower extremity strength/endurance to facilitate safe ambulation around home and community  Baseline: 153ft Goal status: INITIAL  2.  Patient  will demo right knee active range of motion into both flexion/ extension to [facility] to facilitate ADL completion, stairs, squatting.   Baseline: see above  Goal status: INITIAL  3.  Patient will be independent with a comprehensive strengthening HEP  Baseline:  Goal status: INITIAL  --------------------------------------------------------------------------------------------- PLAN:  PT FREQUENCY: 2x/week  PT DURATION: 6 weeks  PLANNED INTERVENTIONS: Therapeutic exercises, Therapeutic activity, Neuromuscular re-education, Balance training, Gait training, Patient/Family education, Self Care, and Joint mobilization.  PLAN FOR NEXT SESSION: Progress right knee active range of motion/ strength    11:51 AM, 07/22/23 Seymour Bars PT, DPT  [facility] Ph: [phone]

## 2023-07-28 ENCOUNTER — Ambulatory Visit (HOSPITAL_COMMUNITY): Payer: Medicare PPO | Attending: Orthopedic Surgery

## 2023-07-28 DIAGNOSIS — G8929 Other chronic pain: Secondary | ICD-10-CM | POA: Insufficient documentation

## 2023-07-28 DIAGNOSIS — M25561 Pain in right knee: Secondary | ICD-10-CM | POA: Diagnosis not present

## 2023-07-28 DIAGNOSIS — Z96651 Presence of right artificial knee joint: Secondary | ICD-10-CM | POA: Insufficient documentation

## 2023-07-28 DIAGNOSIS — R262 Difficulty in walking, not elsewhere classified: Secondary | ICD-10-CM | POA: Insufficient documentation

## 2023-07-28 NOTE — Therapy (Signed)
OUTPATIENT PHYSICAL THERAPY Treatment   Patient Name: Wendy Newman MRN: [id number] DOB:[birth year], 82 y.o., female Today's Date: 07/28/2023  END OF SESSION:   PT End of Session - 07/28/23 1114     Visit Number 7    Number of Visits 16    Date for PT Re-Evaluation 08/18/23    Authorization Type [employer]    Authorization Time Period 12 visits until 08/18/23    Authorization - Visit Number 7    Authorization - Number of Visits 12    Progress Note Due on Visit 12    PT Start Time 1100    PT Stop Time 1140    PT Time Calculation (min) 40 min    Activity Tolerance Patient limited by pain    Behavior During Therapy WFL for tasks assessed/performed               Past Medical History:  Diagnosis Date   Anxiety    Cataract    DVT (deep venous thrombosis) (HCC)    Edema of both legs    Essential hypertension    Osteoporosis    PONV (postoperative nausea and vomiting)    Vitamin D deficiency    Past Surgical History:  Procedure Laterality Date   ABDOMINAL HYSTERECTOMY     Complete   CATARACT EXTRACTION Bilateral    CESAREAN SECTION     X 3   COLONOSCOPY  10/10/2008   [id number]   COLONOSCOPY WITH PROPOFOL N/A 12/21/2014   Dr. Rourk:noncompliant left colon and redundant right colon, rectal and colonic mucosa appeared normal.    TOTAL KNEE ARTHROPLASTY Right 06/16/2023   Procedure: TOTAL KNEE ARTHROPLASTY;  Surgeon: Vickki Hearing, MD;  Location: [facility];  Service: Orthopedics;  Laterality: Right;   Patient Active Problem List   Diagnosis Date Noted   Nausea & vomiting 06/17/2023   Osteoarthritis of right knee 06/16/2023   Acute deep vein thrombosis (DVT) of popliteal vein of left lower extremity (HCC) 10/02/2021   Allergic rhinoconjunctivitis 07/02/2017   Family history of colonic polyps 11/28/2014   Family history of colon cancer 11/28/2014   Constipation 11/28/2014   Vitamin D deficiency 06/16/2013   Hypertension    Anxiety    Osteoporosis     Cataract    Hyperlipemia 05/18/2009   Anxiety state 05/18/2009   NECK PAIN 05/18/2009   ALLERGIC RHINITIS 08/28/2008   OSTEOARTHRITIS 08/28/2008   POSTMENOPAUSAL OSTEOPOROSIS 08/28/2008   PAT 04/24/2008   OTHER ABNORMAL BLOOD CHEMISTRY 04/24/2008   Essential hypertension 12/16/2007   HEADACHE 12/16/2007    PCP: Donita Brooks, MDRef Provider (PCP)   REFERRING PROVIDER:   Vickki Hearing, MD    REFERRING DIAG: M17.11 (ICD-10-CM) - Unilateral primary osteoarthritis, right knee   Rationale for Evaluation and Treatment: Rehabilitation  THERAPY DIAG:  No diagnosis found.  ONSET DATE: right TKR August 20/2024 --------------------------------------------------------------------------------------------- SUBJECTIVE:  SUBJECTIVE STATEMENT: Pt with no new reports today.   *Patient only has 9 rides left for transportation; pending update if she can get more  IE: Patient underwent right TKR 06/16/23. Patient stayed in hospital for 2 nights and was discharged to home PT for 3x/wk for 2 weeks. Patient was walking at home and stubbed two toes on left lower extremity and fractured two toes right after surgery; MD did nothing to left lower extremity toes and will heal on its own. Pt presents to OPPT.   PERTINENT HISTORY:  HTN  PAIN:  Are you having pain? Yes: NPRS scale: 2-3/10 Pain location: right knee Pain description: dull/achy Aggravating factors: walking, standing, bending  Relieving factors: rest  PRECAUTIONS: None  RED FLAGS: None   WEIGHT BEARING RESTRICTIONS: No  FALLS:  Has patient fallen in last 6 months? No  LIVING ENVIRONMENT: Lives with: lives alone Lives in: House/apartment Stairs: No Has following equipment at home: Single point cane and Environmental consultant - 2  wheeled  OCCUPATION: retired   PLOF: Independent  PATIENT GOALS: to walk pain free  NEXT MD VISIT: July 30 2023 --------------------------------------------------------------------------------------------- OBJECTIVE:   DIAGNOSTIC FINDINGS:  XR:  Narrative & Impression  CLINICAL DATA:  Postop right knee replacement.   EXAM: PORTABLE RIGHT KNEE - 1-2 VIEW   COMPARISON:  None Available.   FINDINGS: Right knee arthroplasty in expected alignment. No periprosthetic lucency or fracture. There has been patellar resurfacing. Recent postsurgical change includes air and edema in the soft tissues and joint space. Anterior skin staples in place.   IMPRESSION: Right knee arthroplasty without immediate postoperative complication    PATIENT SURVEYS:  FOTO [phone]  SCREENING FOR RED FLAGS: Bowel or bladder incontinence: No Spinal tumors: No Cauda equina syndrome: No Compression fracture: No Abdominal aneurysm: No  COGNITION: Overall cognitive status: Within functional limits for tasks assessed  POSTURE: No Significant postural limitations      FUNCTIONAL TESTS:  2 minute walk test: 190 ft   GAIT ANALYSIS: Distance walked: 157feet Assistive device utilized: Single point cane Level of assistance: SBA Comments: Patient with moderate antalgia; maintains right knee in extension during stance phase  SENSATION: WFL    LOWER EXTREMITY MMT:    MMT Right eval  Hip flexion   Hip extension   Hip abduction   Hip adduction   Hip internal rotation   Hip external rotation   Knee flexion 3-/5  Knee extension 3-/5  Ankle dorsiflexion   Ankle plantarflexion   Ankle inversion   Ankle eversion    (Blank rows = not tested)  LOWER EXTREMITY ROM:     Active / PROM  Right eval Right 07/13/23  Hip flexion    Hip extension    Hip abduction    Hip adduction    Hip internal rotation    Hip external rotation    Knee flexion A:0-75* P: 0-90* 111 AROM  Knee extension  A:(5) P:0* -5 AROM; 0 AAROM  Ankle dorsiflexion    Ankle plantarflexion    Ankle inversion    Ankle eversion     (Blank rows = not tested)  LOWER EXTREMITY SPECIAL TESTS Knee special tests: not indicated  PALPATION: Moderate tenderness to palpation right knee incision; moderate ms guarding  INTEGUMENTARY   Incision CDI; moderate swelling. 1+ pitting edema on right knee --------------------------------------------------------------------------------------------- TODAY'S TREATMENT:  DATE:  07/28/23 Therapeutic exercise x40'  -Bike Seat 12 warm up -Seated HS Curl machine with 40# 3x10 -Seated leg press with 30# 3x10  -Standing hip flexion, abduction with 3# in // bars 2x10 -Seated LAQs with 3# 2x10  07/22/23 Therapeutic exercise x40'  -NuStep Seat 10, L2 -Seated HS Curl machine with 40# 3x10 -Seated leg press with 30# 3x10  -Standing hip flexion, abduction with 2# in // bars 2x10 -Seated LAQs with 2# 2x10 -Seated knee extension stretch  -Slant board stretch 3X30"     07/20/23 Therapeutic exercise x40'  -Supine SAQs with 3# 2x10, bilateral  -Prolonged stretch into extension supine using 2x3# weights x2'  -Seated LAQS with 3# with 2x10  -Squats to hi/lo mat x 10 -Slant board stretch 3X30" -Seated HS Curl machine with 30# 2x10 -NuStep Seat 10, L2 cooldown    PATIENT EDUCATION:  Education details: HEP, pain management  Person educated: Patient Education method: Explanation and Demonstration Education comprehension: verbalized understanding and returned demonstration  HOME EXERCISE PROGRAM: Access Code: [email or web address] URL: [email or web address] Date: 07/07/2023 Prepared by: Seymour Bars  Exercises - Supine Quad Set  - 7 x weekly - 10-20 reps - 3secs hold - Supine Heel Slide  - 7 x weekly - 10-20 reps - Seated Long Arc Quad  - 7 x  weekly - 10-20 reps --------------------------------------------------------------------------------------------- ASSESSMENT:  CLINICAL IMPRESSION: Today's session continued with focus on right knee mobility. Session focused on Knee Ext active range of motion with increased resistance in machines and ankle weights today followed by knee flexion therapeutic exercise. Patient tolerated well with no increase in pain levels.  Pt will continue to benefit from PT to return to prior level of function   IE: Patient is a 82 y.o. y.o. female who was seen today for physical therapy evaluation and treatment for s/p right TKR 06/16/23. Patient presents to PT with the following objective impairments: Abnormal gait, decreased activity tolerance, decreased balance, difficulty walking, decreased ROM, decreased strength, improper body mechanics, and pain. These impairments limit the patient in activities such as carrying, lifting, bending, standing, squatting, stairs, bed mobility, bathing, toileting, and locomotion level. These impairments also limit the patient in participation such as meal prep, cleaning, laundry, personal finances, interpersonal relationship, driving, shopping, community activity, yard work, and church. The patient will benefit from PT to address the limitations/impairments listed below to return to their prior level of function in the domains of activity and participation.    PERSONAL FACTORS:  n/a  are also affecting patient's functional outcome.   REHAB POTENTIAL: Good  CLINICAL DECISION MAKING: Stable/uncomplicated  EVALUATION COMPLEXITY: Low  ----------------------------------------------------------------------------------------- GOALS: Goals reviewed with patient? No  SHORT TERM GOALS: Target date: 07/28/23   Patient will walk >/= 220 feet for the to improve lower extremity strength/endurance to facilitate safe ambulation around home and community  Baseline: 158ft Goal  status: INITIAL  2.  Patient will score a  on the  >/= 53 on the FOTO  to demonstrate an improvement in ADL completion, stair negotiation, household/community ambulation, and self-care Baseline:  Goal status: INITIAL  3. Patient will be independent with a basic stretching/strengthening HEP  Baseline:  Goal status: INITIAL   LONG TERM GOALS: Target date: 08/18/2023    Patient will walk >/= 250 feet for the to improve lower extremity strength/endurance to facilitate safe ambulation around home and community  Baseline: 151ft Goal status: INITIAL  2.  Patient will demo right knee active range of motion into both flexion/ extension to  WFL to facilitate ADL completion, stairs, squatting.   Baseline: see above  Goal status: INITIAL  3.  Patient will be independent with a comprehensive strengthening HEP  Baseline:  Goal status: INITIAL  --------------------------------------------------------------------------------------------- PLAN:  PT FREQUENCY: 2x/week  PT DURATION: 6 weeks  PLANNED INTERVENTIONS: Therapeutic exercises, Therapeutic activity, Neuromuscular re-education, Balance training, Gait training, Patient/Family education, Self Care, and Joint mobilization.  PLAN FOR NEXT SESSION: Progress right knee active range of motion/ strength    11:15 AM, 07/28/23 Seymour Bars PT, DPT  [facility] Ph: [phone]

## 2023-07-30 ENCOUNTER — Encounter: Payer: Self-pay | Admitting: Orthopedic Surgery

## 2023-07-30 ENCOUNTER — Ambulatory Visit: Payer: Medicare PPO | Admitting: Orthopedic Surgery

## 2023-07-30 ENCOUNTER — Encounter (HOSPITAL_COMMUNITY): Payer: Medicare PPO | Admitting: Physical Therapy

## 2023-07-30 VITALS — BP 159/88 | HR 74 | Ht 66.0 in | Wt 169.0 lb

## 2023-07-30 DIAGNOSIS — Z96651 Presence of right artificial knee joint: Secondary | ICD-10-CM

## 2023-07-30 NOTE — Progress Notes (Signed)
Chief Complaint  Patient presents with   Routine Post Op    Poat op Right TKA patient said it feel like something is knotted up in the front of the knee when streching or standing    Postop visit right total knee date of surgery June 16, 2023  Patient complains of some tightness in the front of the knee  I looked at her physical therapy notes and and reexamined her today.  Her wound is clean healed well no signs of DVT stop Eliquis tightness in the anterolateral portion of the knee does not affect range of motion at all she has full extension and 115 degrees if not 120 degrees of knee flexion  She can stop her Eliquis continue her therapy follow-up in 6 weeks everything looks fine

## 2023-08-03 ENCOUNTER — Ambulatory Visit (HOSPITAL_COMMUNITY): Payer: Medicare PPO

## 2023-08-03 DIAGNOSIS — R262 Difficulty in walking, not elsewhere classified: Secondary | ICD-10-CM | POA: Diagnosis not present

## 2023-08-03 DIAGNOSIS — G8929 Other chronic pain: Secondary | ICD-10-CM | POA: Diagnosis not present

## 2023-08-03 DIAGNOSIS — Z96651 Presence of right artificial knee joint: Secondary | ICD-10-CM | POA: Diagnosis not present

## 2023-08-03 DIAGNOSIS — M25561 Pain in right knee: Secondary | ICD-10-CM | POA: Diagnosis not present

## 2023-08-03 NOTE — Therapy (Signed)
OUTPATIENT PHYSICAL THERAPY Treatment   Patient Name: Wendy Newman MRN: [id number] DOB:Aug 14, 1941, 82 y.o., female Today's Date: 08/04/2023  END OF SESSION:   PT End of Session - 08/03/23 1108     Visit Number 8    Number of Visits 16    Date for PT Re-Evaluation 08/18/23    Authorization Type [employer]    Authorization Time Period 12 visits until 08/18/23    Authorization - Number of Visits 12    Progress Note Due on Visit 12    PT Start Time 1104    PT Stop Time 1144    PT Time Calculation (min) 40 min    Activity Tolerance Patient limited by pain    Behavior During Therapy WFL for tasks assessed/performed                Past Medical History:  Diagnosis Date   Anxiety    Cataract    DVT (deep venous thrombosis) (HCC)    Edema of both legs    Essential hypertension    Osteoporosis    PONV (postoperative nausea and vomiting)    Vitamin D deficiency    Past Surgical History:  Procedure Laterality Date   ABDOMINAL HYSTERECTOMY     Complete   CATARACT EXTRACTION Bilateral    CESAREAN SECTION     X 3   COLONOSCOPY  10/10/2008   NWG:[id number]   COLONOSCOPY WITH PROPOFOL N/A 12/21/2014   Dr. Rourk:noncompliant left colon and redundant right colon, rectal and colonic mucosa appeared normal.    TOTAL KNEE ARTHROPLASTY Right 06/16/2023   Procedure: TOTAL KNEE ARTHROPLASTY;  Surgeon: Vickki Hearing, MD;  Location: [facility];  Service: Orthopedics;  Laterality: Right;   Patient Active Problem List   Diagnosis Date Noted   Nausea & vomiting 06/17/2023   Osteoarthritis of right knee 06/16/2023   Acute deep vein thrombosis (DVT) of popliteal vein of left lower extremity (HCC) 10/02/2021   Allergic rhinoconjunctivitis 07/02/2017   Family history of colonic polyps 11/28/2014   Family history of colon cancer 11/28/2014   Constipation 11/28/2014   Vitamin D deficiency 06/16/2013   Hypertension    Anxiety    Osteoporosis    Cataract    Hyperlipemia  05/18/2009   Anxiety state 05/18/2009   NECK PAIN 05/18/2009   ALLERGIC RHINITIS 08/28/2008   OSTEOARTHRITIS 08/28/2008   POSTMENOPAUSAL OSTEOPOROSIS 08/28/2008   PAT 04/24/2008   OTHER ABNORMAL BLOOD CHEMISTRY 04/24/2008   Essential hypertension 12/16/2007   HEADACHE 12/16/2007    PCP: Donita Brooks, MDRef Provider (PCP)   REFERRING PROVIDER:   Vickki Hearing, MD    REFERRING DIAG: M17.11 (ICD-10-CM) - Unilateral primary osteoarthritis, right knee   Rationale for Evaluation and Treatment: Rehabilitation  THERAPY DIAG:  Difficulty in walking, not elsewhere classified  Status post total right knee replacement  Chronic pain of right knee  ONSET DATE: right TKR August 20/2024 --------------------------------------------------------------------------------------------- SUBJECTIVE:  SUBJECTIVE STATEMENT: Patient saw MD last Thursday; MD happy with knee. MD discontinued Eliquis medication, as per patient. Patient have transportation available only up to Oct 21     *Patient only has 9 rides left for transportation; pending update if she can get more  IE: Patient underwent right TKR 06/16/23. Patient stayed in hospital for 2 nights and was discharged to home PT for 3x/wk for 2 weeks. Patient was walking at home and stubbed two toes on left lower extremity and fractured two toes right after surgery; MD did nothing to left lower extremity toes and will heal on its own. Pt presents to OPPT.   PERTINENT HISTORY:  HTN  PAIN:  Are you having pain? Yes: NPRS scale: 2-3/10 Pain location: right knee Pain description: dull/achy Aggravating factors: walking, standing, bending  Relieving factors: rest  PRECAUTIONS: None  RED FLAGS: None   WEIGHT BEARING RESTRICTIONS: No  FALLS:  Has  patient fallen in last 6 months? No  LIVING ENVIRONMENT: Lives with: lives alone Lives in: House/apartment Stairs: No Has following equipment at home: Single point cane and Environmental consultant - 2 wheeled  OCCUPATION: retired   PLOF: Independent  PATIENT GOALS: to walk pain free  NEXT MD VISIT: July 30 2023 --------------------------------------------------------------------------------------------- OBJECTIVE:   DIAGNOSTIC FINDINGS:  XR:  Narrative & Impression  CLINICAL DATA:  Postop right knee replacement.   EXAM: PORTABLE RIGHT KNEE - 1-2 VIEW   COMPARISON:  None Available.   FINDINGS: Right knee arthroplasty in expected alignment. No periprosthetic lucency or fracture. There has been patellar resurfacing. Recent postsurgical change includes air and edema in the soft tissues and joint space. Anterior skin staples in place.   IMPRESSION: Right knee arthroplasty without immediate postoperative complication    PATIENT SURVEYS:  FOTO [phone]  SCREENING FOR RED FLAGS: Bowel or bladder incontinence: No Spinal tumors: No Cauda equina syndrome: No Compression fracture: No Abdominal aneurysm: No  COGNITION: Overall cognitive status: Within functional limits for tasks assessed  POSTURE: No Significant postural limitations      FUNCTIONAL TESTS:  2 minute walk test: 190 ft   GAIT ANALYSIS: Distance walked: 137feet Assistive device utilized: Single point cane Level of assistance: SBA Comments: Patient with moderate antalgia; maintains right knee in extension during stance phase  SENSATION: WFL    LOWER EXTREMITY MMT:    MMT Right eval  Hip flexion   Hip extension   Hip abduction   Hip adduction   Hip internal rotation   Hip external rotation   Knee flexion 3-/5  Knee extension 3-/5  Ankle dorsiflexion   Ankle plantarflexion   Ankle inversion   Ankle eversion    (Blank rows = not tested)  LOWER EXTREMITY ROM:     Active / PROM  Right eval  Right 07/13/23  Hip flexion    Hip extension    Hip abduction    Hip adduction    Hip internal rotation    Hip external rotation    Knee flexion A:0-75* P: 0-90* 111 AROM  Knee extension A:(5) P:0* -5 AROM; 0 AAROM  Ankle dorsiflexion    Ankle plantarflexion    Ankle inversion    Ankle eversion     (Blank rows = not tested)  LOWER EXTREMITY SPECIAL TESTS Knee special tests: not indicated  PALPATION: Moderate tenderness to palpation right knee incision; moderate ms guarding  INTEGUMENTARY   Incision CDI; moderate swelling. 1+ pitting edema on right knee --------------------------------------------------------------------------------------------- TODAY'S TREATMENT:  DATE:   08/03/23 Therapeutic exercise x32'  -Bike Seat 12 warm up -Seated HS Curl machine with 40# 2x10 -Seated leg press with 40# 2x10  -Seated LAQs with 3# 2x10 -Standing hip flexion, abduction with 3# in // bars 2x10 Therapeutic Activity x8' -Partial S2S in // bars, as tolerated -Step up onto 4" step + weight shift in // bars   07/28/23 Therapeutic exercise x40'  -Bike Seat 12 warm up -Seated HS Curl machine with 40# 3x10 -Seated leg press with 30# 3x10  -Standing hip flexion, abduction with 3# in // bars 2x10 -Seated LAQs with 3# 2x10  07/22/23 Therapeutic exercise x40'  -NuStep Seat 10, L2 -Seated HS Curl machine with 40# 3x10 -Seated leg press with 30# 3x10  -Standing hip flexion, abduction with 2# in // bars 2x10 -Seated LAQs with 2# 2x10 -Seated knee extension stretch  -Slant board stretch 3X30"     07/20/23 Therapeutic exercise x40'  -Supine SAQs with 3# 2x10, bilateral  -Prolonged stretch into extension supine using 2x3# weights x2'  -Seated LAQS with 3# with 2x10  -Squats to hi/lo mat x 10 -Slant board stretch 3X30" -Seated HS Curl machine with 30# 2x10 -NuStep  Seat 10, L2 cooldown    PATIENT EDUCATION:  Education details: HEP, pain management  Person educated: Patient Education method: Explanation and Demonstration Education comprehension: verbalized understanding and returned demonstration  HOME EXERCISE PROGRAM: Access Code: [email or web address] URL: [email or web address] Date: 07/07/2023 Prepared by: Seymour Bars  Exercises - Supine Quad Set  - 7 x weekly - 10-20 reps - 3secs hold - Supine Heel Slide  - 7 x weekly - 10-20 reps - Seated Long Arc Quad  - 7 x weekly - 10-20 reps --------------------------------------------------------------------------------------------- ASSESSMENT:  CLINICAL IMPRESSION: Today's session continued with focus on right knee lower extremity strength. Session focused on resistive active range of motion. Pt able to tolerated added weight # used in session. Patient tolerated well with no increase in pain levels. Pt will continue to benefit from PT to return to prior level of function   IE: Patient is a 82 y.o. y.o. female who was seen today for physical therapy evaluation and treatment for s/p right TKR 06/16/23. Patient presents to PT with the following objective impairments: Abnormal gait, decreased activity tolerance, decreased balance, difficulty walking, decreased ROM, decreased strength, improper body mechanics, and pain. These impairments limit the patient in activities such as carrying, lifting, bending, standing, squatting, stairs, bed mobility, bathing, toileting, and locomotion level. These impairments also limit the patient in participation such as meal prep, cleaning, laundry, personal finances, interpersonal relationship, driving, shopping, community activity, yard work, and church. The patient will benefit from PT to address the limitations/impairments listed below to return to their prior level of function in the domains of activity and participation.    PERSONAL FACTORS:  n/a  are also affecting  patient's functional outcome.   REHAB POTENTIAL: Good  CLINICAL DECISION MAKING: Stable/uncomplicated  EVALUATION COMPLEXITY: Low  ----------------------------------------------------------------------------------------- GOALS: Goals reviewed with patient? No  SHORT TERM GOALS: Target date: 08/04/23   Patient will walk >/= 220 feet for the to improve lower extremity strength/endurance to facilitate safe ambulation around home and community  Baseline: 181ft Goal status: INITIAL  2.  Patient will score a  on the  >/= 53 on the FOTO  to demonstrate an improvement in ADL completion, stair negotiation, household/community ambulation, and self-care Baseline:  Goal status: INITIAL  3. Patient will be independent with a basic stretching/strengthening HEP  Baseline:  Goal  status: INITIAL   LONG TERM GOALS: Target date: 08/18/2023    Patient will walk >/= 250 feet for the to improve lower extremity strength/endurance to facilitate safe ambulation around home and community  Baseline: 127ft Goal status: INITIAL  2.  Patient will demo right knee active range of motion into both flexion/ extension to [facility] to facilitate ADL completion, stairs, squatting.   Baseline: see above  Goal status: INITIAL  3.  Patient will be independent with a comprehensive strengthening HEP  Baseline:  Goal status: INITIAL  --------------------------------------------------------------------------------------------- PLAN:  PT FREQUENCY: 2x/week  PT DURATION: 6 weeks  PLANNED INTERVENTIONS: Therapeutic exercises, Therapeutic activity, Neuromuscular re-education, Balance training, Gait training, Patient/Family education, Self Care, and Joint mobilization.  PLAN FOR NEXT SESSION: Progress into functional motion such as step up/down, weight shifting/lunging, squats, hip hinges   8:12 AM, 08/04/23 Seymour Bars PT, DPT  [facility] Ph:  [phone]

## 2023-08-05 ENCOUNTER — Ambulatory Visit (HOSPITAL_COMMUNITY): Payer: Medicare PPO | Admitting: Physical Therapy

## 2023-08-05 DIAGNOSIS — M25561 Pain in right knee: Secondary | ICD-10-CM | POA: Diagnosis not present

## 2023-08-05 DIAGNOSIS — G8929 Other chronic pain: Secondary | ICD-10-CM | POA: Diagnosis not present

## 2023-08-05 DIAGNOSIS — R262 Difficulty in walking, not elsewhere classified: Secondary | ICD-10-CM | POA: Diagnosis not present

## 2023-08-05 DIAGNOSIS — Z96651 Presence of right artificial knee joint: Secondary | ICD-10-CM

## 2023-08-05 NOTE — Therapy (Signed)
OUTPATIENT PHYSICAL THERAPY Treatment   Patient Name: Wendy Newman MRN: [id number] DOB:1941/03/09, 82 y.o., female Today's Date: 08/05/2023  END OF SESSION:   PT End of Session - 08/05/23 1114     Visit Number 9    Number of Visits 16    Date for PT Re-Evaluation 08/18/23    Authorization Type [employer]    Authorization Time Period 12 visits until 08/18/23    Authorization - Number of Visits 12    Progress Note Due on Visit 12    PT Start Time 1110    PT Stop Time 1145    PT Time Calculation (min) 35 min    Activity Tolerance Patient limited by pain    Behavior During Therapy WFL for tasks assessed/performed                Past Medical History:  Diagnosis Date   Anxiety    Cataract    DVT (deep venous thrombosis) (HCC)    Edema of both legs    Essential hypertension    Osteoporosis    PONV (postoperative nausea and vomiting)    Vitamin D deficiency    Past Surgical History:  Procedure Laterality Date   ABDOMINAL HYSTERECTOMY     Complete   CATARACT EXTRACTION Bilateral    CESAREAN SECTION     X 3   COLONOSCOPY  10/10/2008   NWG:[id number]   COLONOSCOPY WITH PROPOFOL N/A 12/21/2014   Dr. Rourk:noncompliant left colon and redundant right colon, rectal and colonic mucosa appeared normal.    TOTAL KNEE ARTHROPLASTY Right 06/16/2023   Procedure: TOTAL KNEE ARTHROPLASTY;  Surgeon: Vickki Hearing, MD;  Location: [facility];  Service: Orthopedics;  Laterality: Right;   Patient Active Problem List   Diagnosis Date Noted   Nausea & vomiting 06/17/2023   Osteoarthritis of right knee 06/16/2023   Acute deep vein thrombosis (DVT) of popliteal vein of left lower extremity (HCC) 10/02/2021   Allergic rhinoconjunctivitis 07/02/2017   Family history of colonic polyps 11/28/2014   Family history of colon cancer 11/28/2014   Constipation 11/28/2014   Vitamin D deficiency 06/16/2013   Hypertension    Anxiety    Osteoporosis    Cataract    Hyperlipemia  05/18/2009   Anxiety state 05/18/2009   NECK PAIN 05/18/2009   ALLERGIC RHINITIS 08/28/2008   OSTEOARTHRITIS 08/28/2008   POSTMENOPAUSAL OSTEOPOROSIS 08/28/2008   PAT 04/24/2008   OTHER ABNORMAL BLOOD CHEMISTRY 04/24/2008   Essential hypertension 12/16/2007   HEADACHE 12/16/2007    PCP: Donita Brooks, MDRef Provider (PCP)   REFERRING PROVIDER:   Vickki Hearing, MD    REFERRING DIAG: M17.11 (ICD-10-CM) - Unilateral primary osteoarthritis, right knee   Rationale for Evaluation and Treatment: Rehabilitation  THERAPY DIAG:  Difficulty in walking, not elsewhere classified  Status post total right knee replacement  Chronic pain of right knee  ONSET DATE: right TKR August 20/2024 --------------------------------------------------------------------------------------------- SUBJECTIVE:  SUBJECTIVE STATEMENT: Patient running late today due to transportation arriving late. Pt to have transportation available only up to Oct 21.  Reports Rt knee pain of 5/10 today.   *Patient only has 8 rides left for transportation; pending update if she can get more   IE: Patient underwent right TKR 06/16/23. Patient stayed in hospital for 2 nights and was discharged to home PT for 3x/wk for 2 weeks. Patient was walking at home and stubbed two toes on left lower extremity and fractured two toes right after surgery; MD did nothing to left lower extremity toes and will heal on its own. Pt presents to OPPT.   PERTINENT HISTORY:  HTN  PAIN:  Are you having pain? Yes: NPRS scale: 5/10 Pain location: right knee Pain description: dull/achy Aggravating factors: walking, standing, bending  Relieving factors: rest  PRECAUTIONS: None  RED FLAGS: None   WEIGHT BEARING RESTRICTIONS: No  FALLS:  Has patient  fallen in last 6 months? No  LIVING ENVIRONMENT: Lives with: lives alone Lives in: House/apartment Stairs: No Has following equipment at home: Single point cane and Environmental consultant - 2 wheeled  OCCUPATION: retired   PLOF: Independent  PATIENT GOALS: to walk pain free  NEXT MD VISIT: July 30 2023 --------------------------------------------------------------------------------------------- OBJECTIVE:   DIAGNOSTIC FINDINGS:  XR:  Narrative & Impression  CLINICAL DATA:  Postop right knee replacement.   EXAM: PORTABLE RIGHT KNEE - 1-2 VIEW   COMPARISON:  None Available.   FINDINGS: Right knee arthroplasty in expected alignment. No periprosthetic lucency or fracture. There has been patellar resurfacing. Recent postsurgical change includes air and edema in the soft tissues and joint space. Anterior skin staples in place.   IMPRESSION: Right knee arthroplasty without immediate postoperative complication    PATIENT SURVEYS:  FOTO [phone]  SCREENING FOR RED FLAGS: Bowel or bladder incontinence: No Spinal tumors: No Cauda equina syndrome: No Compression fracture: No Abdominal aneurysm: No  COGNITION: Overall cognitive status: Within functional limits for tasks assessed  POSTURE: No Significant postural limitations      FUNCTIONAL TESTS:  2 minute walk test: 190 ft   GAIT ANALYSIS: Distance walked: 150feet Assistive device utilized: Single point cane Level of assistance: SBA Comments: Patient with moderate antalgia; maintains right knee in extension during stance phase  SENSATION: WFL    LOWER EXTREMITY MMT:    MMT Right eval  Hip flexion   Hip extension   Hip abduction   Hip adduction   Hip internal rotation   Hip external rotation   Knee flexion 3-/5  Knee extension 3-/5  Ankle dorsiflexion   Ankle plantarflexion   Ankle inversion   Ankle eversion    (Blank rows = not tested)  LOWER EXTREMITY ROM:     Active / PROM  Right eval Right 07/13/23   Hip flexion    Hip extension    Hip abduction    Hip adduction    Hip internal rotation    Hip external rotation    Knee flexion A:0-75* P: 0-90* 111 AROM  Knee extension A:(5) P:0* -5 AROM; 0 AAROM  Ankle dorsiflexion    Ankle plantarflexion    Ankle inversion    Ankle eversion     (Blank rows = not tested)  LOWER EXTREMITY SPECIAL TESTS Knee special tests: not indicated  PALPATION: Moderate tenderness to palpation right knee incision; moderate ms guarding  INTEGUMENTARY   Incision CDI; moderate swelling. 1+ pitting edema on right knee --------------------------------------------------------------------------------------------- TODAY'S TREATMENT:  DATE:  08/05/23 Bike seat 12 full rev 5' Standing:  Rt knee flexion onto 12" step 10X10"   Lunges onto 4" step no UE 10X2 sets each LE  4" forward step ups 1 UE assist 2X10 each  4" lateral step ups 1 UE assist 2X10 each Gait without AD  08/03/23 Therapeutic exercise x32'  -Bike Seat 12 warm up -Seated HS Curl machine with 40# 2x10 -Seated leg press with 40# 2x10  -Seated LAQs with 3# 2x10 -Standing hip flexion, abduction with 3# in // bars 2x10 Therapeutic Activity x8' -Partial S2S in // bars, as tolerated -Step up onto 4" step + weight shift in // bars  07/28/23 Therapeutic exercise x40'  -Bike Seat 12 warm up -Seated HS Curl machine with 40# 3x10 -Seated leg press with 30# 3x10  -Standing hip flexion, abduction with 3# in // bars 2x10 -Seated LAQs with 3# 2x10  07/22/23 Therapeutic exercise x40'  -NuStep Seat 10, L2 -Seated HS Curl machine with 40# 3x10 -Seated leg press with 30# 3x10  -Standing hip flexion, abduction with 2# in // bars 2x10 -Seated LAQs with 2# 2x10 -Seated knee extension stretch  -Slant board stretch 3X30"  07/20/23 Therapeutic exercise x40'  -Supine SAQs with 3# 2x10,  bilateral  -Prolonged stretch into extension supine using 2x3# weights x2'  -Seated LAQS with 3# with 2x10  -Squats to hi/lo mat x 10 -Slant board stretch 3X30" -Seated HS Curl machine with 30# 2x10 -NuStep Seat 10, L2 cooldown    PATIENT EDUCATION:  Education details: HEP, pain management  Person educated: Patient Education method: Explanation and Demonstration Education comprehension: verbalized understanding and returned demonstration  HOME EXERCISE PROGRAM: Access Code: [email or web address] URL: [email or web address] Date: 07/07/2023 Prepared by: Seymour Bars  Exercises - Supine Quad Set  - 7 x weekly - 10-20 reps - 3secs hold - Supine Heel Slide  - 7 x weekly - 10-20 reps - Seated Long Arc Quad  - 7 x weekly - 10-20 reps --------------------------------------------------------------------------------------------- ASSESSMENT:  CLINICAL IMPRESSION: Pt currently approx 2 months post op.  Still with c/o Rt knee pain and generally slow mobility.  Ambulating with SPC and requires frequent rest breaks with activities.  Began lunges without UE assist with visible mm tremors from weakness.  Progressed step activities.  Pt requires "pushing" and encouragement but able to complete challenges. Treatment limited today by late arrival. Pt will continue to benefit from PT to return to prior level of function   IE: Patient is a 82 y.o. y.o. female who was seen today for physical therapy evaluation and treatment for s/p right TKR 06/16/23. Patient presents to PT with the following objective impairments: Abnormal gait, decreased activity tolerance, decreased balance, difficulty walking, decreased ROM, decreased strength, improper body mechanics, and pain. These impairments limit the patient in activities such as carrying, lifting, bending, standing, squatting, stairs, bed mobility, bathing, toileting, and locomotion level. These impairments also limit the patient in participation such as meal  prep, cleaning, laundry, personal finances, interpersonal relationship, driving, shopping, community activity, yard work, and church. The patient will benefit from PT to address the limitations/impairments listed below to return to their prior level of function in the domains of activity and participation.    PERSONAL FACTORS:  n/a  are also affecting patient's functional outcome.   REHAB POTENTIAL: Good  CLINICAL DECISION MAKING: Stable/uncomplicated  EVALUATION COMPLEXITY: Low  ----------------------------------------------------------------------------------------- GOALS: Goals reviewed with patient? No  SHORT TERM GOALS: Target date: 08/05/23   Patient will walk >/= 220 feet for  the to improve lower extremity strength/endurance to facilitate safe ambulation around home and community  Baseline: 134ft Goal status: INITIAL  2.  Patient will score a  on the  >/= 53 on the FOTO  to demonstrate an improvement in ADL completion, stair negotiation, household/community ambulation, and self-care Baseline:  Goal status: INITIAL  3. Patient will be independent with a basic stretching/strengthening HEP  Baseline:  Goal status: INITIAL   LONG TERM GOALS: Target date: 08/18/2023    Patient will walk >/= 250 feet for the to improve lower extremity strength/endurance to facilitate safe ambulation around home and community  Baseline: 124ft Goal status: INITIAL  2.  Patient will demo right knee active range of motion into both flexion/ extension to [facility] to facilitate ADL completion, stairs, squatting.   Baseline: see above  Goal status: INITIAL  3.  Patient will be independent with a comprehensive strengthening HEP  Baseline:  Goal status: INITIAL  --------------------------------------------------------------------------------------------- PLAN:  PT FREQUENCY: 2x/week  PT DURATION: 6 weeks  PLANNED INTERVENTIONS: Therapeutic exercises, Therapeutic activity,  Neuromuscular re-education, Balance training, Gait training, Patient/Family education, Self Care, and Joint mobilization.  PLAN FOR NEXT SESSION: Increase functional endurance, mobility and functional tasks. Reduce gait to no AD when able.  11:15 AM, 08/05/23 Lurena Nida, PTA/CLT [facility] Ph: [phone]

## 2023-08-10 ENCOUNTER — Ambulatory Visit (HOSPITAL_COMMUNITY): Payer: Medicare PPO

## 2023-08-10 DIAGNOSIS — R262 Difficulty in walking, not elsewhere classified: Secondary | ICD-10-CM | POA: Diagnosis not present

## 2023-08-10 DIAGNOSIS — Z96651 Presence of right artificial knee joint: Secondary | ICD-10-CM | POA: Diagnosis not present

## 2023-08-10 DIAGNOSIS — G8929 Other chronic pain: Secondary | ICD-10-CM

## 2023-08-10 DIAGNOSIS — M25561 Pain in right knee: Secondary | ICD-10-CM | POA: Diagnosis not present

## 2023-08-10 NOTE — Therapy (Signed)
OUTPATIENT PHYSICAL THERAPY Treatment   Patient Name: Wendy Newman MRN: [id number] DOB:Jun 25, 1941, 82 y.o., female Today's Date: 08/10/2023  END OF SESSION:   PT End of Session - 08/10/23 1055     Visit Number 10    Number of Visits 16    Date for PT Re-Evaluation 08/18/23    Authorization Type [employer]    Authorization Time Period 12 visits until 08/18/23    Authorization - Visit Number 8    Authorization - Number of Visits 12    Progress Note Due on Visit 12    PT Start Time 1100    PT Stop Time 1140    PT Time Calculation (min) 40 min    Activity Tolerance Patient limited by pain    Behavior During Therapy WFL for tasks assessed/performed                Past Medical History:  Diagnosis Date   Anxiety    Cataract    DVT (deep venous thrombosis) (HCC)    Edema of both legs    Essential hypertension    Osteoporosis    PONV (postoperative nausea and vomiting)    Vitamin D deficiency    Past Surgical History:  Procedure Laterality Date   ABDOMINAL HYSTERECTOMY     Complete   CATARACT EXTRACTION Bilateral    CESAREAN SECTION     X 3   COLONOSCOPY  10/10/2008   LKG:[id number]   COLONOSCOPY WITH PROPOFOL N/A 12/21/2014   Dr. Rourk:noncompliant left colon and redundant right colon, rectal and colonic mucosa appeared normal.    TOTAL KNEE ARTHROPLASTY Right 06/16/2023   Procedure: TOTAL KNEE ARTHROPLASTY;  Surgeon: Vickki Hearing, MD;  Location: [facility];  Service: Orthopedics;  Laterality: Right;   Patient Active Problem List   Diagnosis Date Noted   Nausea & vomiting 06/17/2023   Osteoarthritis of right knee 06/16/2023   Acute deep vein thrombosis (DVT) of popliteal vein of left lower extremity (HCC) 10/02/2021   Allergic rhinoconjunctivitis 07/02/2017   Family history of colonic polyps 11/28/2014   Family history of colon cancer 11/28/2014   Constipation 11/28/2014   Vitamin D deficiency 06/16/2013   Hypertension    Anxiety     Osteoporosis    Cataract    Hyperlipemia 05/18/2009   Anxiety state 05/18/2009   NECK PAIN 05/18/2009   ALLERGIC RHINITIS 08/28/2008   OSTEOARTHRITIS 08/28/2008   POSTMENOPAUSAL OSTEOPOROSIS 08/28/2008   PAT 04/24/2008   OTHER ABNORMAL BLOOD CHEMISTRY 04/24/2008   Essential hypertension 12/16/2007   HEADACHE 12/16/2007    PCP: Donita Brooks, MDRef Provider (PCP)   REFERRING PROVIDER:   Vickki Hearing, MD    REFERRING DIAG: M17.11 (ICD-10-CM) - Unilateral primary osteoarthritis, right knee   Rationale for Evaluation and Treatment: Rehabilitation  THERAPY DIAG:  Difficulty in walking, not elsewhere classified  Status post total right knee replacement  Chronic pain of right knee  ONSET DATE: right TKR August 20/2024 --------------------------------------------------------------------------------------------- SUBJECTIVE:  SUBJECTIVE STATEMENT: "My knee hurts today"; reports 5/10 pain; most trouble with bending. Has trouble getting up off the couch at home.    *Patient only has 8 rides left for transportation; pending update if she can get more   IE: Patient underwent right TKR 06/16/23. Patient stayed in hospital for 2 nights and was discharged to home PT for 3x/wk for 2 weeks. Patient was walking at home and stubbed two toes on left lower extremity and fractured two toes right after surgery; MD did nothing to left lower extremity toes and will heal on its own. Pt presents to OPPT.   PERTINENT HISTORY:  HTN  PAIN:  Are you having pain? Yes: NPRS scale: 5/10 Pain location: right knee Pain description: dull/achy Aggravating factors: walking, standing, bending  Relieving factors: rest  PRECAUTIONS: None  RED FLAGS: None   WEIGHT BEARING RESTRICTIONS: No  FALLS:  Has patient  fallen in last 6 months? No  LIVING ENVIRONMENT: Lives with: lives alone Lives in: House/apartment Stairs: No Has following equipment at home: Single point cane and Environmental consultant - 2 wheeled  OCCUPATION: retired   PLOF: Independent  PATIENT GOALS: to walk pain free  NEXT MD VISIT: July 30 2023 --------------------------------------------------------------------------------------------- OBJECTIVE:   DIAGNOSTIC FINDINGS:  XR:  Narrative & Impression  CLINICAL DATA:  Postop right knee replacement.   EXAM: PORTABLE RIGHT KNEE - 1-2 VIEW   COMPARISON:  None Available.   FINDINGS: Right knee arthroplasty in expected alignment. No periprosthetic lucency or fracture. There has been patellar resurfacing. Recent postsurgical change includes air and edema in the soft tissues and joint space. Anterior skin staples in place.   IMPRESSION: Right knee arthroplasty without immediate postoperative complication    PATIENT SURVEYS:  FOTO [phone]  SCREENING FOR RED FLAGS: Bowel or bladder incontinence: No Spinal tumors: No Cauda equina syndrome: No Compression fracture: No Abdominal aneurysm: No  COGNITION: Overall cognitive status: Within functional limits for tasks assessed  POSTURE: No Significant postural limitations      FUNCTIONAL TESTS:  2 minute walk test: 190 ft   GAIT ANALYSIS: Distance walked: 119feet Assistive device utilized: Single point cane Level of assistance: SBA Comments: Patient with moderate antalgia; maintains right knee in extension during stance phase  SENSATION: WFL    LOWER EXTREMITY MMT:    MMT Right eval  Hip flexion   Hip extension   Hip abduction   Hip adduction   Hip internal rotation   Hip external rotation   Knee flexion 3-/5  Knee extension 3-/5  Ankle dorsiflexion   Ankle plantarflexion   Ankle inversion   Ankle eversion    (Blank rows = not tested)  LOWER EXTREMITY ROM:     Active / PROM  Right eval Right 07/13/23  Right 08/10/23  Hip flexion     Hip extension     Hip abduction     Hip adduction     Hip internal rotation     Hip external rotation     Knee flexion A:0-75* P: 0-90* 111 AROM 116  Knee extension A:(5) P:0* -5 AROM; 0 AAROM -5  Ankle dorsiflexion     Ankle plantarflexion     Ankle inversion     Ankle eversion      (Blank rows = not tested)  LOWER EXTREMITY SPECIAL TESTS Knee special tests: not indicated  PALPATION: Moderate tenderness to palpation right knee incision; moderate ms guarding  INTEGUMENTARY   Incision CDI; moderate swelling. 1+ pitting edema on right knee --------------------------------------------------------------------------------------------- TODAY'S  TREATMENT:                                                                                                                              DATE:  08/10/23 Bike seat 12 full rev 5' dynamic warm up  Standing: Heel/toe raises x 20 12" step knee drives for flexion x 2' Slant board 5 x 20" Hamstring 12" step stretch 5 x 20" Squats 2 x 10 4" box step ups 2 x 10 right leading with 1 UE assist 4" box lateral step ups 2 x 10 with light bilateral UE assist  Sit to stand no UE assist x 8 -5 to 116 today right knee AROM in supine   08/05/23 Bike seat 12 full rev 5' Standing:  Rt knee flexion onto 12" step 10X10"   Lunges onto 4" step no UE 10X2 sets each LE  4" forward step ups 1 UE assist 2X10 each  4" lateral step ups 1 UE assist 2X10 each Gait without AD  08/03/23 Therapeutic exercise x32'  -Bike Seat 12 warm up -Seated HS Curl machine with 40# 2x10 -Seated leg press with 40# 2x10  -Seated LAQs with 3# 2x10 -Standing hip flexion, abduction with 3# in // bars 2x10 Therapeutic Activity x8' -Partial S2S in // bars, as tolerated -Step up onto 4" step + weight shift in // bars  07/28/23 Therapeutic exercise x40'  -Bike Seat 12 warm up -Seated HS Curl machine with 40# 3x10 -Seated leg press with 30# 3x10   -Standing hip flexion, abduction with 3# in // bars 2x10 -Seated LAQs with 3# 2x10  07/22/23 Therapeutic exercise x40'  -NuStep Seat 10, L2 -Seated HS Curl machine with 40# 3x10 -Seated leg press with 30# 3x10  -Standing hip flexion, abduction with 2# in // bars 2x10 -Seated LAQs with 2# 2x10 -Seated knee extension stretch  -Slant board stretch 3X30"  07/20/23 Therapeutic exercise x40'  -Supine SAQs with 3# 2x10, bilateral  -Prolonged stretch into extension supine using 2x3# weights x2'  -Seated LAQS with 3# with 2x10  -Squats to hi/lo mat x 10 -Slant board stretch 3X30" -Seated HS Curl machine with 30# 2x10 -NuStep Seat 10, L2 cooldown    PATIENT EDUCATION:  Education details: HEP, pain management  Person educated: Patient Education method: Explanation and Demonstration Education comprehension: verbalized understanding and returned demonstration  HOME EXERCISE PROGRAM: Access Code: [email or web address] URL: [email or web address] Date: 07/07/2023 Prepared by: Seymour Bars  Exercises - Supine Quad Set  - 7 x weekly - 10-20 reps - 3secs hold - Supine Heel Slide  - 7 x weekly - 10-20 reps - Seated Long Arc Quad  - 7 x weekly - 10-20 reps --------------------------------------------------------------------------------------------- ASSESSMENT:  CLINICAL IMPRESSION: Today's session with continued work on right knee mobility and strength. Patient continues with antalgic gait; decreased stance right lower extremity versus left; using SPC. Tends to advance right leg with sit to stand and stand to sit; improves with cues.   Patient  will benefit from continued skilled therapy services  to address deficits and promote return to optimal function.       IE: Patient is a 82 y.o. y.o. female who was seen today for physical therapy evaluation and treatment for s/p right TKR 06/16/23. Patient presents to PT with the following objective impairments: Abnormal gait, decreased activity  tolerance, decreased balance, difficulty walking, decreased ROM, decreased strength, improper body mechanics, and pain. These impairments limit the patient in activities such as carrying, lifting, bending, standing, squatting, stairs, bed mobility, bathing, toileting, and locomotion level. These impairments also limit the patient in participation such as meal prep, cleaning, laundry, personal finances, interpersonal relationship, driving, shopping, community activity, yard work, and church. The patient will benefit from PT to address the limitations/impairments listed below to return to their prior level of function in the domains of activity and participation.    PERSONAL FACTORS:  n/a  are also affecting patient's functional outcome.   REHAB POTENTIAL: Good  CLINICAL DECISION MAKING: Stable/uncomplicated  EVALUATION COMPLEXITY: Low  ----------------------------------------------------------------------------------------- GOALS: Goals reviewed with patient? No  SHORT TERM GOALS: Target date: 08/10/23   Patient will walk >/= 220 feet for the to improve lower extremity strength/endurance to facilitate safe ambulation around home and community  Baseline: 180ft Goal status: INITIAL  2.  Patient will score a  on the  >/= 53 on the FOTO  to demonstrate an improvement in ADL completion, stair negotiation, household/community ambulation, and self-care Baseline:  Goal status: INITIAL  3. Patient will be independent with a basic stretching/strengthening HEP  Baseline:  Goal status: INITIAL   LONG TERM GOALS: Target date: 08/18/2023    Patient will walk >/= 250 feet for the to improve lower extremity strength/endurance to facilitate safe ambulation around home and community  Baseline: 171ft Goal status: INITIAL  2.  Patient will demo right knee active range of motion into both flexion/ extension to [facility] to facilitate ADL completion, stairs, squatting.   Baseline: see above   Goal status: INITIAL  3.  Patient will be independent with a comprehensive strengthening HEP  Baseline:  Goal status: INITIAL  --------------------------------------------------------------------------------------------- PLAN:  PT FREQUENCY: 2x/week  PT DURATION: 6 weeks  PLANNED INTERVENTIONS: Therapeutic exercises, Therapeutic activity, Neuromuscular re-education, Balance training, Gait training, Patient/Family education, Self Care, and Joint mobilization.  PLAN FOR NEXT SESSION: Increase functional endurance, mobility and functional tasks. Reduce gait to no AD when able. 2 visits remain with availability for rides.   11:29 AM, 08/10/23 Vannia Pola Small Slayde Brault MPT [facility] [place] [phone]

## 2023-08-12 ENCOUNTER — Ambulatory Visit (HOSPITAL_COMMUNITY): Payer: Medicare PPO

## 2023-08-12 DIAGNOSIS — R262 Difficulty in walking, not elsewhere classified: Secondary | ICD-10-CM | POA: Diagnosis not present

## 2023-08-12 DIAGNOSIS — G8929 Other chronic pain: Secondary | ICD-10-CM | POA: Diagnosis not present

## 2023-08-12 DIAGNOSIS — Z96651 Presence of right artificial knee joint: Secondary | ICD-10-CM

## 2023-08-12 DIAGNOSIS — M25561 Pain in right knee: Secondary | ICD-10-CM | POA: Diagnosis not present

## 2023-08-12 NOTE — Therapy (Signed)
OUTPATIENT PHYSICAL THERAPY Treatment   Patient Name: Wendy Newman MRN: [id number] DOB:[birth year], 82 y.o., female Today's Date: 08/12/2023  END OF SESSION:   PT End of Session - 08/12/23 1115     Visit Number 11    Number of Visits 16    Date for PT Re-Evaluation 08/18/23    Authorization Type [employer]    Authorization Time Period 12 visits until 08/18/23    Authorization - Number of Visits 12    Progress Note Due on Visit 12    PT Start Time 1100    PT Stop Time 1140    PT Time Calculation (min) 40 min    Activity Tolerance Patient limited by pain    Behavior During Therapy WFL for tasks assessed/performed                Past Medical History:  Diagnosis Date   Anxiety    Cataract    DVT (deep venous thrombosis) (HCC)    Edema of both legs    Essential hypertension    Osteoporosis    PONV (postoperative nausea and vomiting)    Vitamin D deficiency    Past Surgical History:  Procedure Laterality Date   ABDOMINAL HYSTERECTOMY     Complete   CATARACT EXTRACTION Bilateral    CESAREAN SECTION     X 3   COLONOSCOPY  10/10/2008   OHY:[id number]   COLONOSCOPY WITH PROPOFOL N/A 12/21/2014   Dr. Rourk:noncompliant left colon and redundant right colon, rectal and colonic mucosa appeared normal.    TOTAL KNEE ARTHROPLASTY Right 06/16/2023   Procedure: TOTAL KNEE ARTHROPLASTY;  Surgeon: Vickki Hearing, MD;  Location: [facility];  Service: Orthopedics;  Laterality: Right;   Patient Active Problem List   Diagnosis Date Noted   Nausea & vomiting 06/17/2023   Osteoarthritis of right knee 06/16/2023   Acute deep vein thrombosis (DVT) of popliteal vein of left lower extremity (HCC) 10/02/2021   Allergic rhinoconjunctivitis 07/02/2017   Family history of colonic polyps 11/28/2014   Family history of colon cancer 11/28/2014   Constipation 11/28/2014   Vitamin D deficiency 06/16/2013   Hypertension    Anxiety    Osteoporosis    Cataract    Hyperlipemia  05/18/2009   Anxiety state 05/18/2009   NECK PAIN 05/18/2009   ALLERGIC RHINITIS 08/28/2008   OSTEOARTHRITIS 08/28/2008   POSTMENOPAUSAL OSTEOPOROSIS 08/28/2008   PAT 04/24/2008   OTHER ABNORMAL BLOOD CHEMISTRY 04/24/2008   Essential hypertension 12/16/2007   HEADACHE 12/16/2007    PCP: Donita Brooks, MDRef Provider (PCP)   REFERRING PROVIDER:   Vickki Hearing, MD    REFERRING DIAG: M17.11 (ICD-10-CM) - Unilateral primary osteoarthritis, right knee   Rationale for Evaluation and Treatment: Rehabilitation  THERAPY DIAG:  No diagnosis found.  ONSET DATE: right TKR August 20/2024 --------------------------------------------------------------------------------------------- SUBJECTIVE:  SUBJECTIVE STATEMENT: "My knee hurts today"; reports 5/10 pain in right knee   *Patient only has 8 rides left for transportation; pending update if she can get more   IE: Patient underwent right TKR 06/16/23. Patient stayed in hospital for 2 nights and was discharged to home PT for 3x/wk for 2 weeks. Patient was walking at home and stubbed two toes on left lower extremity and fractured two toes right after surgery; MD did nothing to left lower extremity toes and will heal on its own. Pt presents to OPPT.   PERTINENT HISTORY:  HTN  PAIN:  Are you having pain? Yes: NPRS scale: 5/10 Pain location: right knee Pain description: dull/achy Aggravating factors: walking, standing, bending  Relieving factors: rest  PRECAUTIONS: None  RED FLAGS: None   WEIGHT BEARING RESTRICTIONS: No  FALLS:  Has patient fallen in last 6 months? No  LIVING ENVIRONMENT: Lives with: lives alone Lives in: House/apartment Stairs: No Has following equipment at home: Single point cane and Environmental consultant - 2 wheeled  OCCUPATION:  retired   PLOF: Independent  PATIENT GOALS: to walk pain free  NEXT MD VISIT: July 30 2023 --------------------------------------------------------------------------------------------- OBJECTIVE:   DIAGNOSTIC FINDINGS:  XR:  Narrative & Impression  CLINICAL DATA:  Postop right knee replacement.   EXAM: PORTABLE RIGHT KNEE - 1-2 VIEW   COMPARISON:  None Available.   FINDINGS: Right knee arthroplasty in expected alignment. No periprosthetic lucency or fracture. There has been patellar resurfacing. Recent postsurgical change includes air and edema in the soft tissues and joint space. Anterior skin staples in place.   IMPRESSION: Right knee arthroplasty without immediate postoperative complication    PATIENT SURVEYS:  FOTO [phone]  SCREENING FOR RED FLAGS: Bowel or bladder incontinence: No Spinal tumors: No Cauda equina syndrome: No Compression fracture: No Abdominal aneurysm: No  COGNITION: Overall cognitive status: Within functional limits for tasks assessed  POSTURE: No Significant postural limitations      FUNCTIONAL TESTS:  2 minute walk test: 190 ft   GAIT ANALYSIS: Distance walked: 125feet Assistive device utilized: Single point cane Level of assistance: SBA Comments: Patient with moderate antalgia; maintains right knee in extension during stance phase  SENSATION: WFL    LOWER EXTREMITY MMT:    MMT Right eval  Hip flexion   Hip extension   Hip abduction   Hip adduction   Hip internal rotation   Hip external rotation   Knee flexion 3-/5  Knee extension 3-/5  Ankle dorsiflexion   Ankle plantarflexion   Ankle inversion   Ankle eversion    (Blank rows = not tested)  LOWER EXTREMITY ROM:     Active / PROM  Right eval Right 07/13/23 Right 08/10/23  Hip flexion     Hip extension     Hip abduction     Hip adduction     Hip internal rotation     Hip external rotation     Knee flexion A:0-75* P: 0-90* 111 AROM 116  Knee extension  A:(5) P:0* -5 AROM; 0 AAROM -5  Ankle dorsiflexion     Ankle plantarflexion     Ankle inversion     Ankle eversion      (Blank rows = not tested)  LOWER EXTREMITY SPECIAL TESTS Knee special tests: not indicated  PALPATION: Moderate tenderness to palpation right knee incision; moderate ms guarding  INTEGUMENTARY   Incision CDI; moderate swelling. 1+ pitting edema on right knee --------------------------------------------------------------------------------------------- TODAY'S TREATMENT:  DATE:   08/12/23 Bike seat 12 full rev 5' dynamic warm up Seated  Knee extensions with 3# x20   Hip marches with 3# x20 Standing  Hip abduction, flexion with 3# 2x10   4" box step ups with 3# + UE assist in // bars  4" box lateral step ups with 3#+ UE assist in // bars S2S to elevated chair    08/10/23 Bike seat 12 full rev 5' dynamic warm up  Standing: Heel/toe raises x 20 12" step knee drives for flexion x 2' Slant board 5 x 20" Hamstring 12" step stretch 5 x 20" Squats 2 x 10 4" box step ups 2 x 10 right leading with 1 UE assist 4" box lateral step ups 2 x 10 with light bilateral UE assist  Sit to stand no UE assist x 8 -5 to 116 today right knee AROM in supine   08/05/23 Bike seat 12 full rev 5' Standing:  Rt knee flexion onto 12" step 10X10"   Lunges onto 4" step no UE 10X2 sets each LE  4" forward step ups 1 UE assist 2X10 each  4" lateral step ups 1 UE assist 2X10 each Gait without AD  08/03/23 Therapeutic exercise x32'  -Bike Seat 12 warm up -Seated HS Curl machine with 40# 2x10 -Seated leg press with 40# 2x10  -Seated LAQs with 3# 2x10 -Standing hip flexion, abduction with 3# in // bars 2x10 Therapeutic Activity x8' -Partial S2S in // bars, as tolerated -Step up onto 4" step + weight shift in // bars  07/28/23 Therapeutic exercise x40'   -Bike Seat 12 warm up -Seated HS Curl machine with 40# 3x10 -Seated leg press with 30# 3x10  -Standing hip flexion, abduction with 3# in // bars 2x10 -Seated LAQs with 3# 2x10  PATIENT EDUCATION:  Education details: HEP, pain management  Person educated: Patient Education method: Explanation and Demonstration Education comprehension: verbalized understanding and returned demonstration  HOME EXERCISE PROGRAM: Access Code: [email or web address] URL: [email or web address] Date: 07/07/2023 Prepared by: Seymour Bars  Exercises - Supine Quad Set  - 7 x weekly - 10-20 reps - 3secs hold - Supine Heel Slide  - 7 x weekly - 10-20 reps - Seated Long Arc Quad  - 7 x weekly - 10-20 reps --------------------------------------------------------------------------------------------- ASSESSMENT:  CLINICAL IMPRESSION: Today's session with continued work on right knee mobility and strength. Patient continues with antalgic gait; decreased right knee quad terminal extension.  ends to advance right leg with sit to stand and stand to sit; improves with cues.   Patient will benefit from continued skilled therapy services  to address deficits and promote return to optimal function.       IE: Patient is a 82 y.o. y.o. female who was seen today for physical therapy evaluation and treatment for s/p right TKR 06/16/23. Patient presents to PT with the following objective impairments: Abnormal gait, decreased activity tolerance, decreased balance, difficulty walking, decreased ROM, decreased strength, improper body mechanics, and pain. These impairments limit the patient in activities such as carrying, lifting, bending, standing, squatting, stairs, bed mobility, bathing, toileting, and locomotion level. These impairments also limit the patient in participation such as meal prep, cleaning, laundry, personal finances, interpersonal relationship, driving, shopping, community activity, yard work, and church. The patient  will benefit from PT to address the limitations/impairments listed below to return to their prior level of function in the domains of activity and participation.    PERSONAL FACTORS:  n/a  are also affecting patient's functional outcome.  REHAB POTENTIAL: Good  CLINICAL DECISION MAKING: Stable/uncomplicated  EVALUATION COMPLEXITY: Low  ----------------------------------------------------------------------------------------- GOALS: Goals reviewed with patient? No  SHORT TERM GOALS: Target date: 08/12/23   Patient will walk >/= 220 feet for the to improve lower extremity strength/endurance to facilitate safe ambulation around home and community  Baseline: 156ft Goal status: INITIAL  2.  Patient will score a  on the  >/= 53 on the FOTO  to demonstrate an improvement in ADL completion, stair negotiation, household/community ambulation, and self-care Baseline:  Goal status: INITIAL  3. Patient will be independent with a basic stretching/strengthening HEP  Baseline:  Goal status: INITIAL   LONG TERM GOALS: Target date: 08/18/2023    Patient will walk >/= 250 feet for the to improve lower extremity strength/endurance to facilitate safe ambulation around home and community  Baseline: 123ft Goal status: INITIAL  2.  Patient will demo right knee active range of motion into both flexion/ extension to [facility] to facilitate ADL completion, stairs, squatting.   Baseline: see above  Goal status: INITIAL  3.  Patient will be independent with a comprehensive strengthening HEP  Baseline:  Goal status: INITIAL  --------------------------------------------------------------------------------------------- PLAN:  PT FREQUENCY: 2x/week  PT DURATION: 6 weeks  PLANNED INTERVENTIONS: Therapeutic exercises, Therapeutic activity, Neuromuscular re-education, Balance training, Gait training, Patient/Family education, Self Care, and Joint mobilization.  PLAN FOR NEXT SESSION: Increase  functional endurance, mobility and functional tasks. Reduce gait to no AD when able. 2 visits remain with availability for rides.   11:17 AM, 08/12/23 Seymour Bars PT, DPT  [facility] [place] [phone]

## 2023-08-17 ENCOUNTER — Ambulatory Visit (HOSPITAL_COMMUNITY): Payer: Medicare PPO

## 2023-08-17 DIAGNOSIS — G8929 Other chronic pain: Secondary | ICD-10-CM | POA: Diagnosis not present

## 2023-08-17 DIAGNOSIS — R262 Difficulty in walking, not elsewhere classified: Secondary | ICD-10-CM

## 2023-08-17 DIAGNOSIS — M25561 Pain in right knee: Secondary | ICD-10-CM | POA: Diagnosis not present

## 2023-08-17 DIAGNOSIS — Z96651 Presence of right artificial knee joint: Secondary | ICD-10-CM

## 2023-08-17 NOTE — Therapy (Signed)
OUTPATIENT PHYSICAL THERAPY Discharge   Patient Name: Wendy Newman MRN: [id number] DOB:[birth year], 82 y.o., female Today's Date: 08/17/2023  PHYSICAL THERAPY DISCHARGE SUMMARY  Visits from Start of Care: 12  Current functional level related to goals / functional outcomes: See below    Remaining deficits: See below   Education / Equipment: See below   Patient agrees to discharge. Patient goals were partially met. Patient is being discharged due to  transportation issues.     END OF SESSION:   PT End of Session - 08/17/23 1102     Visit Number 12    Number of Visits 16    Date for PT Re-Evaluation 08/18/23    Authorization Type [employer]    Authorization Time Period 12 visits until 08/18/23    Authorization - Number of Visits 12    Progress Note Due on Visit 12    PT Start Time 1100    PT Stop Time 1138    PT Time Calculation (min) 38 min    Activity Tolerance Patient limited by pain    Behavior During Therapy WFL for tasks assessed/performed                Past Medical History:  Diagnosis Date   Anxiety    Cataract    DVT (deep venous thrombosis) (HCC)    Edema of both legs    Essential hypertension    Osteoporosis    PONV (postoperative nausea and vomiting)    Vitamin D deficiency    Past Surgical History:  Procedure Laterality Date   ABDOMINAL HYSTERECTOMY     Complete   CATARACT EXTRACTION Bilateral    CESAREAN SECTION     X 3   COLONOSCOPY  10/10/2008   VQQ:[id number]   COLONOSCOPY WITH PROPOFOL N/A 12/21/2014   Dr. Rourk:noncompliant left colon and redundant right colon, rectal and colonic mucosa appeared normal.    TOTAL KNEE ARTHROPLASTY Right 06/16/2023   Procedure: TOTAL KNEE ARTHROPLASTY;  Surgeon: Vickki Hearing, MD;  Location: [facility];  Service: Orthopedics;  Laterality: Right;   Patient Active Problem List   Diagnosis Date Noted   Nausea & vomiting 06/17/2023   Osteoarthritis of right knee 06/16/2023   Acute deep  vein thrombosis (DVT) of popliteal vein of left lower extremity (HCC) 10/02/2021   Allergic rhinoconjunctivitis 07/02/2017   Family history of colonic polyps 11/28/2014   Family history of colon cancer 11/28/2014   Constipation 11/28/2014   Vitamin D deficiency 06/16/2013   Hypertension    Anxiety    Osteoporosis    Cataract    Hyperlipemia 05/18/2009   Anxiety state 05/18/2009   NECK PAIN 05/18/2009   ALLERGIC RHINITIS 08/28/2008   OSTEOARTHRITIS 08/28/2008   POSTMENOPAUSAL OSTEOPOROSIS 08/28/2008   PAT 04/24/2008   OTHER ABNORMAL BLOOD CHEMISTRY 04/24/2008   Essential hypertension 12/16/2007   HEADACHE 12/16/2007    PCP: Donita Brooks, MDRef Provider (PCP)   REFERRING PROVIDER:   Vickki Hearing, MD    REFERRING DIAG: M17.11 (ICD-10-CM) - Unilateral primary osteoarthritis, right knee   Rationale for Evaluation and Treatment: Rehabilitation  THERAPY DIAG:  No diagnosis found.  ONSET DATE: right TKR August 20/2024 --------------------------------------------------------------------------------------------- SUBJECTIVE:  SUBJECTIVE STATEMENT: Discharge Note: Patient states that she "sometimes she has tightness in right lower extremity" and she tries to move the knee around to maintain mobility. Pain is usually at a 2/10 daily with an achy quality. Patient states she has been able to return to church   IE: Patient underwent right TKR 06/16/23. Patient stayed in hospital for 2 nights and was discharged to home PT for 3x/wk for 2 weeks. Patient was walking at home and stubbed two toes on left lower extremity and fractured two toes right after surgery; MD did nothing to left lower extremity toes and will heal on its own. Pt presents to OPPT.   PERTINENT HISTORY:  HTN  PAIN:  Are you  having pain? Yes: NPRS scale: 2/10 Pain location: right knee Pain description: dull/achy Aggravating factors: walking, standing, bending  Relieving factors: rest  PRECAUTIONS: None  RED FLAGS: None   WEIGHT BEARING RESTRICTIONS: No  FALLS:  Has patient fallen in last 6 months? No  LIVING ENVIRONMENT: Lives with: lives alone Lives in: House/apartment Stairs: No Has following equipment at home: Single point cane and Environmental consultant - 2 wheeled  OCCUPATION: retired   PLOF: Independent  PATIENT GOALS: to walk pain free  NEXT MD VISIT: July 30 2023 --------------------------------------------------------------------------------------------- OBJECTIVE:   DIAGNOSTIC FINDINGS:  XR:  Narrative & Impression  CLINICAL DATA:  Postop right knee replacement.   EXAM: PORTABLE RIGHT KNEE - 1-2 VIEW   COMPARISON:  None Available.   FINDINGS: Right knee arthroplasty in expected alignment. No periprosthetic lucency or fracture. There has been patellar resurfacing. Recent postsurgical change includes air and edema in the soft tissues and joint space. Anterior skin staples in place.   IMPRESSION: Right knee arthroplasty without immediate postoperative complication    PATIENT SURVEYS:  FOTO [phone]  10/21: [phone]  SCREENING FOR RED FLAGS: Bowel or bladder incontinence: No Spinal tumors: No Cauda equina syndrome: No Compression fracture: No Abdominal aneurysm: No  COGNITION: Overall cognitive status: Within functional limits for tasks assessed  POSTURE: No Significant postural limitations      FUNCTIONAL TESTS:  2 minute walk test: 190 ft  10/21: 262 feet   GAIT ANALYSIS: Distance walked: 129feet Assistive device utilized: Single point cane Level of assistance: SBA Comments: Patient with MIN antalgia; maintains right knee in extension during stance phase  SENSATION: WFL    LOWER EXTREMITY MMT:    MMT Right eval  Hip flexion   Hip extension   Hip  abduction   Hip adduction   Hip internal rotation   Hip external rotation   Knee flexion 3-/5  Knee extension 3-/5  Ankle dorsiflexion   Ankle plantarflexion   Ankle inversion   Ankle eversion    (Blank rows = not tested)  LOWER EXTREMITY ROM:     Active / PROM  Right eval Right 07/13/23 Right 08/10/23 Right 08/17/23  Hip flexion      Hip extension      Hip abduction      Hip adduction      Hip internal rotation      Hip external rotation      Knee flexion A:0-75* P: 0-90* 111 AROM 116 WFL  Knee extension A:(5) P:0* -5 AROM; 0 AAROM -5 Lacking 3  Ankle dorsiflexion      Ankle plantarflexion      Ankle inversion      Ankle eversion       (Blank rows = not tested)  LOWER EXTREMITY SPECIAL TESTS Knee special tests:  not indicated  PALPATION: Moderate tenderness to palpation right knee incision; moderate ms guarding  INTEGUMENTARY   Incision CDI; moderate swelling. 1+ pitting edema on right knee --------------------------------------------------------------------------------------------- TODAY'S TREATMENT:                                                                                                                              DATE:   08/17/23 Pt Discharge  HEP review  Self-care/ADLs  Objective measures   08/12/23 Bike seat 12 full rev 5' dynamic warm up Seated  Knee extensions with 3# x20   Hip marches with 3# x20 Standing  Hip abduction, flexion with 3# 2x10   4" box step ups with 3# + UE assist in // bars  4" box lateral step ups with 3#+ UE assist in // bars S2S to elevated chair    08/10/23 Bike seat 12 full rev 5' dynamic warm up  Standing: Heel/toe raises x 20 12" step knee drives for flexion x 2' Slant board 5 x 20" Hamstring 12" step stretch 5 x 20" Squats 2 x 10 4" box step ups 2 x 10 right leading with 1 UE assist 4" box lateral step ups 2 x 10 with light bilateral UE assist  Sit to stand no UE assist x 8 -5 to 116 today right knee  AROM in supine   08/05/23 Bike seat 12 full rev 5' Standing:  Rt knee flexion onto 12" step 10X10"   Lunges onto 4" step no UE 10X2 sets each LE  4" forward step ups 1 UE assist 2X10 each  4" lateral step ups 1 UE assist 2X10 each Gait without AD  08/03/23 Therapeutic exercise x32'  -Bike Seat 12 warm up -Seated HS Curl machine with 40# 2x10 -Seated leg press with 40# 2x10  -Seated LAQs with 3# 2x10 -Standing hip flexion, abduction with 3# in // bars 2x10 Therapeutic Activity x8' -Partial S2S in // bars, as tolerated -Step up onto 4" step + weight shift in // bars  07/28/23 Therapeutic exercise x40'  -Bike Seat 12 warm up -Seated HS Curl machine with 40# 3x10 -Seated leg press with 30# 3x10  -Standing hip flexion, abduction with 3# in // bars 2x10 -Seated LAQs with 3# 2x10  PATIENT EDUCATION:  Education details: HEP, pain management  Person educated: Patient Education method: Explanation and Demonstration Education comprehension: verbalized understanding and returned demonstration  HOME EXERCISE PROGRAM: Access Code: [email or web address] URL: [email or web address] Date: 08/17/2023 Prepared by: Seymour Bars  Exercises - Supine Heel Slide  - 1-2 x daily - 20 reps - 3 seconds hold - Supine Quad Set  - 1-2 x daily - 20 reps - 3 seconds hold - Seated Passive Knee Extension  - 1-3 x daily - 3-minutes hold - Bilateral Long Arc Quad  - 1-2 x daily - 20 reps - 3 seoncds hold - Mini Squat with Counter Support  - 1-2 x daily - 20 reps - 3 seconds hold -  Heel Raises with Counter Support  - 1-2 x daily - 20 reps - 3 seconds hold - Standing Hip Abduction with Counter Support  - 1-2 x daily - 20 reps - 3 seconds hold  --------------------------------------------------------------------------------------------- ASSESSMENT:  CLINICAL IMPRESSION: Patient has shown improvements in overall strength, balance, endurance- see above. Patient has met 5/6 PT goals. Patient discharge to  HEP         IE: Patient is a 82 y.o. y.o. female who was seen today for physical therapy evaluation and treatment for s/p right TKR 06/16/23. Patient presents to PT with the following objective impairments: Abnormal gait, decreased activity tolerance, decreased balance, difficulty walking, decreased ROM, decreased strength, improper body mechanics, and pain. These impairments limit the patient in activities such as carrying, lifting, bending, standing, squatting, stairs, bed mobility, bathing, toileting, and locomotion level. These impairments also limit the patient in participation such as meal prep, cleaning, laundry, personal finances, interpersonal relationship, driving, shopping, community activity, yard work, and church. The patient will benefit from PT to address the limitations/impairments listed below to return to their prior level of function in the domains of activity and participation.    PERSONAL FACTORS:  n/a  are also affecting patient's functional outcome.   REHAB POTENTIAL: Good  CLINICAL DECISION MAKING: Stable/uncomplicated  EVALUATION COMPLEXITY: Low  ----------------------------------------------------------------------------------------- GOALS: Goals reviewed with patient? No  SHORT TERM GOALS: Target date: 6 visits    Patient will walk >/= 220 feet for the to improve lower extremity strength/endurance to facilitate safe ambulation around home and community  Baseline: 111ft Goal status: goal met  2.  Patient will score a  on the  >/= 53 on the FOTO  to demonstrate an improvement in ADL completion, stair negotiation, household/community ambulation, and self-care Baseline:  Goal status: goal met  3. Patient will be independent with a basic stretching/strengthening HEP  Baseline:  Goal status: goal met   LONG TERM GOALS: Target date: 08/18/2023    Patient will walk >/= 250 feet for the to improve lower extremity strength/endurance to facilitate safe  ambulation around home and community  Baseline: 139ft Goal status: goal met  2.  Patient will demo right knee active range of motion into both flexion/ extension to [facility] to facilitate ADL completion, stairs, squatting.   Baseline: see above  Goal status: partially met    Lacking 3 degrees into ext  3.  Patient will be independent with a comprehensive strengthening HEP  Baseline:  Goal status: goal met  --------------------------------------------------------------------------------------------- PLAN:  PT FREQUENCY: 2x/week  PT DURATION: 6 weeks  PLANNED INTERVENTIONS: Therapeutic exercises, Therapeutic activity, Neuromuscular re-education, Balance training, Gait training, Patient/Family education, Self Care, and Joint mobilization.  PLAN FOR NEXT SESSION: PT discharge 11:36 AM, 08/17/23 Seymour Bars PT, DPT  [facility] [place] [phone]

## 2023-08-21 ENCOUNTER — Encounter (HOSPITAL_COMMUNITY): Payer: Medicare PPO

## 2023-08-25 ENCOUNTER — Encounter (HOSPITAL_COMMUNITY): Payer: Medicare PPO | Admitting: Physical Therapy

## 2023-09-10 ENCOUNTER — Ambulatory Visit (INDEPENDENT_AMBULATORY_CARE_PROVIDER_SITE_OTHER): Payer: Medicare PPO | Admitting: Orthopedic Surgery

## 2023-09-10 DIAGNOSIS — Z96651 Presence of right artificial knee joint: Secondary | ICD-10-CM

## 2023-09-10 NOTE — Progress Notes (Signed)
Chief Complaint  Patient presents with   Knee Pain    DOS 06/16/23  R/ it hurts sometimes and feels a little puffy on the outside of .   Encounter Diagnosis  Name Primary?   S/P total knee replacement, right 06/16/23 Yes   Status post right total knee arthroplasty   Wendy Newman is doing well she has 2 complaints 1 that the anterior part of the knee is "puffy" and she has some pain on terminal flexion in the patellofemoral region.  She is still using her cane occasionally  Her range of motion is 0-1 20  Follow-up in 3 months continue quadriceps exercises expect resolution of current minor symptoms

## 2023-10-08 ENCOUNTER — Ambulatory Visit: Payer: Medicare Other | Admitting: *Deleted

## 2023-10-08 DIAGNOSIS — Z0000 Encounter for general adult medical examination without abnormal findings: Secondary | ICD-10-CM

## 2023-10-08 NOTE — Patient Instructions (Signed)
Wendy Newman , Thank you for taking time to come for your Medicare Wellness Visit. I appreciate your ongoing commitment to your health goals. Please review the following plan we discussed and let me know if I can assist you in the future.   Screening recommendations/referrals: Colonoscopy: no longer required Mammogram: Education provided Bone Density: Education provided Recommended yearly ophthalmology/optometry visit for glaucoma screening and checkup Recommended yearly dental visit for hygiene and checkup  Vaccinations: Influenza vaccine: up to date Pneumococcal vaccine: up to date Tdap vaccine: Education provided Shingles vaccine: Education provided       Preventive Care 65 Years and Older, Female Preventive care refers to lifestyle choices and visits with your health care provider that can promote health and wellness. What does preventive care include? A yearly physical exam. This is also called an annual well check. Dental exams once or twice a year. Routine eye exams. Ask your health care provider how often you should have your eyes checked. Personal lifestyle choices, including: Daily care of your teeth and gums. Regular physical activity. Eating a healthy diet. Avoiding tobacco and drug use. Limiting alcohol use. Practicing safe sex. Taking low-dose aspirin every day. Taking vitamin and mineral supplements as recommended by your health care provider. What happens during an annual well check? The services and screenings done by your health care provider during your annual well check will depend on your age, overall health, lifestyle risk factors, and family history of disease. Counseling  Your health care provider may ask you questions about your: Alcohol use. Tobacco use. Drug use. Emotional well-being. Home and relationship well-being. Sexual activity. Eating habits. History of falls. Memory and ability to understand (cognition). Work and work  Astronomer. Reproductive health. Screening  You may have the following tests or measurements: Height, weight, and BMI. Blood pressure. Lipid and cholesterol levels. These may be checked every 5 years, or more frequently if you are over 52 years old. Skin check. Lung cancer screening. You may have this screening every year starting at age 47 if you have a 30-pack-year history of smoking and currently smoke or have quit within the past 15 years. Fecal occult blood test (FOBT) of the stool. You may have this test every year starting at age 76. Flexible sigmoidoscopy or colonoscopy. You may have a sigmoidoscopy every 5 years or a colonoscopy every 10 years starting at age 52. Hepatitis C blood test. Hepatitis B blood test. Sexually transmitted disease (STD) testing. Diabetes screening. This is done by checking your blood sugar (glucose) after you have not eaten for a while (fasting). You may have this done every 1-3 years. Bone density scan. This is done to screen for osteoporosis. You may have this done starting at age 59. Mammogram. This may be done every 1-2 years. Talk to your health care provider about how often you should have regular mammograms. Talk with your health care provider about your test results, treatment options, and if necessary, the need for more tests. Vaccines  Your health care provider may recommend certain vaccines, such as: Influenza vaccine. This is recommended every year. Tetanus, diphtheria, and acellular pertussis (Tdap, Td) vaccine. You may need a Td booster every 10 years. Zoster vaccine. You may need this after age 83. Pneumococcal 13-valent conjugate (PCV13) vaccine. One dose is recommended after age 22. Pneumococcal polysaccharide (PPSV23) vaccine. One dose is recommended after age 40. Talk to your health care provider about which screenings and vaccines you need and how often you need them. This information is not  intended to replace advice given to you by  your health care provider. Make sure you discuss any questions you have with your health care provider. Document Released: 11/09/2015 Document Revised: 07/02/2016 Document Reviewed: 08/14/2015 Elsevier Interactive Patient Education  2017 [employer].  Fall Prevention in the Home Falls can cause injuries. They can happen to people of all ages. There are many things you can do to make your home safe and to help prevent falls. What can I do on the outside of my home? Regularly fix the edges of walkways and driveways and fix any cracks. Remove anything that might make you trip as you walk through a door, such as a raised step or threshold. Trim any bushes or trees on the path to your home. Use bright outdoor lighting. Clear any walking paths of anything that might make someone trip, such as rocks or tools. Regularly check to see if handrails are loose or broken. Make sure that both sides of any steps have handrails. Any raised decks and porches should have guardrails on the edges. Have any leaves, snow, or ice cleared regularly. Use sand or salt on walking paths during winter. Clean up any spills in your garage right away. This includes oil or grease spills. What can I do in the bathroom? Use night lights. Install grab bars by the toilet and in the tub and shower. Do not use towel bars as grab bars. Use non-skid mats or decals in the tub or shower. If you need to sit down in the shower, use a plastic, non-slip stool. Keep the floor dry. Clean up any water that spills on the floor as soon as it happens. Remove soap buildup in the tub or shower regularly. Attach bath mats securely with double-sided non-slip rug tape. Do not have throw rugs and other things on the floor that can make you trip. What can I do in the bedroom? Use night lights. Make sure that you have a light by your bed that is easy to reach. Do not use any sheets or blankets that are too big for your bed. They should not hang  down onto the floor. Have a firm chair that has side arms. You can use this for support while you get dressed. Do not have throw rugs and other things on the floor that can make you trip. What can I do in the kitchen? Clean up any spills right away. Avoid walking on wet floors. Keep items that you use a lot in easy-to-reach places. If you need to reach something above you, use a strong step stool that has a grab bar. Keep electrical cords out of the way. Do not use floor polish or wax that makes floors slippery. If you must use wax, use non-skid floor wax. Do not have throw rugs and other things on the floor that can make you trip. What can I do with my stairs? Do not leave any items on the stairs. Make sure that there are handrails on both sides of the stairs and use them. Fix handrails that are broken or loose. Make sure that handrails are as long as the stairways. Check any carpeting to make sure that it is firmly attached to the stairs. Fix any carpet that is loose or worn. Avoid having throw rugs at the top or bottom of the stairs. If you do have throw rugs, attach them to the floor with carpet tape. Make sure that you have a light switch at the top of the stairs  and the bottom of the stairs. If you do not have them, ask someone to add them for you. What else can I do to help prevent falls? Wear shoes that: Do not have high heels. Have rubber bottoms. Are comfortable and fit you well. Are closed at the toe. Do not wear sandals. If you use a stepladder: Make sure that it is fully opened. Do not climb a closed stepladder. Make sure that both sides of the stepladder are locked into place. Ask someone to hold it for you, if possible. Clearly mark and make sure that you can see: Any grab bars or handrails. First and last steps. Where the edge of each step is. Use tools that help you move around (mobility aids) if they are needed. These  include: Canes. Walkers. Scooters. Crutches. Turn on the lights when you go into a dark area. Replace any light bulbs as soon as they burn out. Set up your furniture so you have a clear path. Avoid moving your furniture around. If any of your floors are uneven, fix them. If there are any pets around you, be aware of where they are. Review your medicines with your doctor. Some medicines can make you feel dizzy. This can increase your chance of falling. Ask your doctor what other things that you can do to help prevent falls. This information is not intended to replace advice given to you by your health care provider. Make sure you discuss any questions you have with your health care provider. Document Released: 08/09/2009 Document Revised: 03/20/2016 Document Reviewed: 11/17/2014 Elsevier Interactive Patient Education  2017 [employer].

## 2023-10-08 NOTE — Progress Notes (Signed)
Subjective:   Wendy Newman is a 82 y.o. female who presents for Medicare Annual (Subsequent) preventive examination.  Visit Complete: Virtual I connected with  [name] on 10/08/23 by a audio enabled telemedicine application and verified that I am speaking with the correct person using two identifiers.  Patient Location: Home  Provider Location: Home Office  I discussed the limitations of evaluation and management by telemedicine. The patient expressed understanding and agreed to proceed.  Vital Signs: Because this visit was a virtual/telehealth visit, some criteria may be missing or patient reported. Any vitals not documented were not able to be obtained and vitals that have been documented are patient reported.  Cardiac Risk Factors include: advanced age (>59men, >33 women);family history of premature cardiovascular disease;hypertension     Objective:    Today's Vitals   10/08/23 0832  PainSc: 5    There is no height or weight on file to calculate BMI.     10/08/2023    8:40 AM 07/07/2023    1:36 PM 06/17/2023   12:00 PM 06/16/2023    6:18 AM 06/08/2023   11:32 AM 03/25/2023    9:53 AM 09/30/2022    9:37 AM  Advanced Directives  Does Patient Have a Medical Advance Directive? No No  No No No No  Would patient like information on creating a medical advance directive? No - Patient declined No - Patient declined No - Patient declined  No - Patient declined Yes (Inpatient - patient defers creating a medical advance directive at this time - Information given) Yes (MAU/Ambulatory/Procedural Areas - Information given)    Current Medications (verified) Outpatient Encounter Medications as of 10/08/2023  Medication Sig   alendronate (FOSAMAX) 70 MG tablet Take 1 tablet (70 mg total) by mouth every 7 (seven) days. Take with a full glass of water on an empty stomach.   apixaban (ELIQUIS) 2.5 MG TABS tablet Take 1 tablet (2.5 mg total) by mouth 2 (two) times daily.    atorvastatin (LIPITOR) 20 MG tablet Take 1 tablet (20 mg total) by mouth daily.   bisacodyl (DULCOLAX) 5 MG EC tablet Take 1 tablet (5 mg total) by mouth daily as needed for moderate constipation.   CALCIUM-MAGNESIUM-ZINC-VIT D3 PO Take 1 tablet by mouth daily.   citalopram (CELEXA) 20 MG tablet Take 1 tablet (20 mg total) by mouth daily.   docusate sodium (COLACE) 100 MG capsule Take 1 capsule (100 mg total) by mouth 2 (two) times daily.   fluticasone (FLONASE) 50 MCG/ACT nasal spray Place 2 sprays into both nostrils daily.   hydrochlorothiazide (HYDRODIURIL) 25 MG tablet Take 1 tablet (25 mg total) by mouth daily.   HYDROcodone-acetaminophen (NORCO) 10-325 MG tablet Take 1 tablet by mouth every 4 (four) hours as needed.   levocetirizine (XYZAL) 5 MG tablet Take 1 tablet (5 mg total) by mouth every evening.   methocarbamol (ROBAXIN) 500 MG tablet Take 1 tablet (500 mg total) by mouth every 6 (six) hours as needed for muscle spasms.   ondansetron (ZOFRAN) 4 MG tablet Take 1 tablet (4 mg total) by mouth every 6 (six) hours.   polyethylene glycol (MIRALAX / GLYCOLAX) 17 g packet Take 17 g by mouth daily as needed for mild constipation.   No facility-administered encounter medications on file as of 10/08/2023.    Allergies (verified) Patient has no known allergies.   History: Past Medical History:  Diagnosis Date   Anxiety    Cataract    DVT (deep venous thrombosis) (  HCC)    Edema of both legs    Essential hypertension    Osteoporosis    PONV (postoperative nausea and vomiting)    Vitamin D deficiency    Past Surgical History:  Procedure Laterality Date   ABDOMINAL HYSTERECTOMY     Complete   CATARACT EXTRACTION Bilateral    CESAREAN SECTION     X 3   COLONOSCOPY  10/10/2008   ZOX:[id number]   COLONOSCOPY WITH PROPOFOL N/A 12/21/2014   Dr. Rourk:noncompliant left colon and redundant right colon, rectal and colonic mucosa appeared normal.    TOTAL KNEE ARTHROPLASTY Right  06/16/2023   Procedure: TOTAL KNEE ARTHROPLASTY;  Surgeon: Vickki Hearing, MD;  Location: [facility];  Service: Orthopedics;  Laterality: Right;   Family History  Problem Relation Age of Onset   Cancer Mother 63       Breast. age 55.    Colon polyps Mother    Heart disease Father 37       CABG   Cancer Sister 44       Lung   Cancer Brother 91       Started in the back   Colon cancer Other        Age 83s. 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  Tobacco Use   Smoking status: Never   Smokeless tobacco: Never  Vaping Use   Vaping status: Never Used  Substance and Sexual Activity   Alcohol use: No    Alcohol/week: 0.0 standard drinks of alcohol   Drug use: No   Sexual activity: Yes    Birth control/protection: Surgical  Other Topics Concern   Not on file  Social History Narrative   Lives alone    Does not drive relies on grandson or neighbor for transportation    Social Drivers of Health   Financial Resource Strain: Low Risk  (10/08/2023)   Overall Financial Resource Strain (CARDIA)    Difficulty of Paying Living Expenses: Not hard at all  Food Insecurity: No Food Insecurity (10/08/2023)   Hunger Vital Sign    Worried About Running Out of Food in the Last Year: Never true    Ran Out of Food in the Last Year: Never true  Transportation Needs: No Transportation Needs (10/08/2023)   PRAPARE - Administrator, Civil Service (Medical): No    Lack of Transportation (Non-Medical): No  Physical Activity: Inactive (10/08/2023)   Exercise Vital Sign    Days of Exercise per Week: 0 days    Minutes of Exercise per Session: 0 min  Stress: No Stress Concern Present (10/08/2023)   [employer] of Occupational Health -  Occupational Stress Questionnaire    Feeling of Stress : Not at all  Social Connections: Moderately Isolated (10/08/2023)   Social Connection and Isolation Panel [NHANES]    Frequency of Communication with Friends and Family: More than three times a week    Frequency of Social Gatherings with Friends and Family: Three times a week    Attends Religious Services: More than 4 times per year    Active Member of Clubs or Organizations: No    Attends Banker Meetings: Never    Marital Status: Widowed  Tobacco Counseling Counseling given: Not Answered   Clinical Intake:  Pre-visit preparation completed: Yes  Pain : 0-10 Pain Score: 5  Pain Type: Chronic pain Pain Location: Knee Pain Orientation: Right Pain Descriptors / Indicators: Burning, Aching, Shooting Pain Onset: More than a month ago Pain Frequency: Constant     Diabetes: No  How often do you need to have someone help you when you read instructions, pamphlets, or other written materials from your doctor or pharmacy?: 1 - Never  Interpreter Needed?: No  Information entered by :: Roland Lipke lpn   Activities of Daily Living    10/08/2023    8:42 AM 06/16/2023    3:00 PM  In your present state of health, do you have any difficulty performing the following activities:  Hearing? 0 0  Vision? 0 0  Difficulty concentrating or making decisions? 0 0  Walking or climbing stairs? 1 1  Dressing or bathing? 0 0  Doing errands, shopping? 0 0  Preparing Food and eating ? N   Using the Toilet? N   In the past six months, have you accidently leaked urine? N   Do you have problems with loss of bowel control? N   Managing your Medications? N   Managing your Finances? N   Housekeeping or managing your Housekeeping? N     Patient Care Team: Donita Brooks, MD as PCP - General (Family Medicine) West Bali, MD (Inactive) as Consulting Physician (Gastroenterology)  Indicate any recent Medical Services  you may have received from other than [facility] providers in the past year (date may be approximate).     Assessment:   This is a routine wellness examination for Paradis.  Hearing/Vision screen Hearing Screening - Comments:: No trouble hearing Vision Screening - Comments:: Not up to date 2020 Cataract surgery    Goals Addressed               This Visit's Progress     LIFESTYLE - DECREASE FALLS RISK (pt-stated)   On track     Patient Stated        Continue Current  Lifestyle       Depression Screen    10/08/2023    8:45 AM 07/06/2023   11:20 AM 09/30/2022    9:35 AM 07/24/2022   10:16 AM 07/24/2022   10:15 AM 07/15/2021   10:09 AM 03/26/2021    2:25 PM  PHQ 2/9 Scores  PHQ - 2 Score 0 0 0 0 0 0 0  PHQ- 9 Score 0      0    Fall Risk    10/08/2023    8:39 AM 07/06/2023   11:20 AM 09/30/2022    9:21 AM 09/16/2022    9:47 AM 09/16/2022    9:13 AM  Fall Risk   Falls in the past year? 0 0 1 1 1   Number falls in past yr: 0 0 1 1 0  Injury with Fall? 0 0 1 1 1   Risk for fall due to : Impaired balance/gait  Impaired balance/gait;Impaired mobility;History of fall(s) Impaired balance/gait   Follow up Falls evaluation completed;Education provided;Falls prevention discussed  Falls prevention discussed;Education provided;Falls evaluation completed      MEDICARE RISK AT HOME: Medicare Risk at Home Any stairs in or around the home?: No If so, are there any without handrails?: No Home free of loose throw rugs in walkways, pet beds, electrical cords, etc?: Yes Adequate lighting in your home to reduce risk of falls?:  Yes Life alert?: No Use of a cane, walker or w/c?: Yes Grab bars in the bathroom?: Yes Shower chair or bench in shower?: Yes Elevated toilet seat or a handicapped toilet?: Yes  TIMED UP AND GO:  Was the test performed?  No    Cognitive Function:        10/08/2023    8:43 AM 09/30/2022    9:38 AM  6CIT Screen  What Year? 0 points 0 points  What month? 0  points 0 points  What time? 0 points 0 points  Count back from 20 0 points 0 points  Months in reverse 0 points 0 points  Repeat phrase 10 points 0 points  Total Score 10 points 0 points    Immunizations Immunization History  Administered Date(s) Administered   Fluad Quad(high Dose 65+) 07/15/2019, 07/10/2020   Influenza Split 09/15/2012   Influenza Whole 08/28/2008   Influenza, High Dose Seasonal PF 08/26/2018, 07/15/2021   Influenza, Seasonal, Injecte, Preservative Fre 07/06/2023   Influenza,inj,Quad PF,6+ Mos 09/19/2013, 08/16/2014, 10/17/2015, 09/22/2016, 07/02/2017   Influenza-Unspecified 07/24/2022   Moderna Covid-19 Vaccine Bivalent Booster 24yrs & up 01/13/2022   Moderna Sars-Covid-2 Vaccination 12/22/2019, 01/20/2020   Pneumococcal Conjugate-13 06/21/2014   Pneumococcal Polysaccharide-23 06/16/2012   Zoster Recombinant(Shingrix) 01/13/2022    TDAP status: Due, Education has been provided regarding the importance of this vaccine. Advised may receive this vaccine at local pharmacy or Health Dept. Aware to provide a copy of the vaccination record if obtained from local pharmacy or Health Dept. Verbalized acceptance and understanding.  Flu Vaccine status: Up to date  Pneumococcal vaccine status: Up to date  Covid-19 vaccine status: Declined, Education has been provided regarding the importance of this vaccine but patient still declined. Advised may receive this vaccine at local pharmacy or Health Dept.or vaccine clinic. Aware to provide a copy of the vaccination record if obtained from local pharmacy or Health Dept. Verbalized acceptance and understanding.  Qualifies for Shingles Vaccine? Yes   Zostavax completed No   Shingrix Completed?: No.    Education has been provided regarding the importance of this vaccine. Patient has been advised to call insurance company to determine out of pocket expense if they have not yet received this vaccine. Advised may also receive vaccine at  local pharmacy or Health Dept. Verbalized acceptance and understanding.  Screening Tests Health Maintenance  Topic Date Due   COVID-19 Vaccine (4 - 2024-25 season) 10/24/2023 (Originally 06/28/2023)   Zoster Vaccines- Shingrix (2 of 2) 01/06/2024 (Originally 03/10/2022)   DTaP/Tdap/Td (1 - Tdap) 10/07/2024 (Originally 09/15/1960)   Medicare Annual Wellness (AWV)  10/07/2024   Pneumonia Vaccine 58+ Years old  Completed   INFLUENZA VACCINE  Completed   DEXA SCAN  Completed   HPV VACCINES  Aged Out   Colonoscopy  Discontinued    Health Maintenance  There are no preventive care reminders to display for this patient.   Colorectal cancer screening: No longer required.   Mammogram status: No longer required due to age.  Bone Density status: Completed 2022. Results reflect: Bone density results: OSTEOPOROSIS. Repeat every 2 years.  Lung Cancer Screening: (Low Dose CT Chest recommended if Age 73-80 years, 20 pack-year currently smoking OR have quit w/in 15years.) does not qualify.   Lung Cancer Screening Referral:   Additional Screening:  Hepatitis C Screening: does not qualify;   Vision Screening: Recommended annual ophthalmology exams for early detection of glaucoma and other disorders of the eye. Is the patient up to date with their  annual eye exam?  No  Who is the provider or what is the name of the office in which the patient attends annual eye exams?    If pt is not established with a provider, would they like to be referred to a provider to establish care? No .   Dental Screening: Recommended annual dental exams for proper oral hygiene    Community Resource Referral / Chronic Care Management: CRR required this visit?  No   CCM required this visit?  No     Plan:     I have personally reviewed and noted the following in the patient's chart:   Medical and social history Use of alcohol, tobacco or illicit drugs  Current medications and supplements including opioid  prescriptions. Patient is not currently taking opioid prescriptions. Functional ability and status Nutritional status Physical activity Advanced directives List of other physicians Hospitalizations, surgeries, and ER visits in previous 12 months Vitals Screenings to include cognitive, depression, and falls Referrals and appointments  In addition, I have reviewed and discussed with patient certain preventive protocols, quality metrics, and best practice recommendations. A written personalized care plan for preventive services as well as general preventive health recommendations were provided to patient.     Remi Haggard, LPN   16/07/9603   After Visit Summary: (MyChart) Due to this being a telephonic visit, the after visit summary with patients personalized plan was offered to patient via MyChart   Nurse Notes:

## 2023-10-29 ENCOUNTER — Telehealth: Payer: Self-pay | Admitting: Family Medicine

## 2023-10-29 ENCOUNTER — Other Ambulatory Visit: Payer: Self-pay

## 2023-10-29 DIAGNOSIS — I10 Essential (primary) hypertension: Secondary | ICD-10-CM

## 2023-10-29 MED ORDER — HYDROCHLOROTHIAZIDE 25 MG PO TABS: 25.0000 mg | ORAL_TABLET | Freq: Every day | ORAL | 1 refills | Status: DC

## 2023-10-29 NOTE — Telephone Encounter (Signed)
 Prescription Request  10/29/2023  LOV: 07/06/2023  What is the name of the medication or equipment? hydrochlorothiazide  (HYDRODIURIL ) 25 MG tablet   Have you contacted your pharmacy to request a refill? Yes   Which pharmacy would you like this sent to?  [facility] [place], [place] - [address] [facility] [facility] [address] [place] [address] [place] [address] Phone: [phone] Fax: [phone]    Patient notified that their request is being sent to the clinical staff for review and that they should receive a response within 2 business days.   Please advise at [facility] [phone]

## 2023-12-03 ENCOUNTER — Other Ambulatory Visit: Payer: Self-pay | Admitting: Family Medicine

## 2023-12-03 DIAGNOSIS — J309 Allergic rhinitis, unspecified: Secondary | ICD-10-CM

## 2023-12-09 NOTE — Progress Notes (Unsigned)
   There were no vitals taken for this visit.  There is no height or weight on file to calculate BMI.  No chief complaint on file.   Encounter Diagnosis  Name Primary?   S/P total knee replacement, right 06/16/23 Yes    DOI/DOS/ Date: 06/16/23  {CHL AMB ORT SYMPTOMS POST TREATMENT:21798}

## 2023-12-11 ENCOUNTER — Encounter: Payer: Self-pay | Admitting: Orthopedic Surgery

## 2023-12-11 ENCOUNTER — Ambulatory Visit: Payer: 59 | Admitting: Orthopedic Surgery

## 2023-12-11 DIAGNOSIS — M7631 Iliotibial band syndrome, right leg: Secondary | ICD-10-CM | POA: Diagnosis not present

## 2023-12-11 DIAGNOSIS — Z96651 Presence of right artificial knee joint: Secondary | ICD-10-CM

## 2023-12-11 MED ORDER — NAPROXEN 500 MG PO TABS
500.0000 mg | ORAL_TABLET | Freq: Two times a day (BID) | ORAL | 2 refills | Status: DC
Start: 2023-12-11 — End: 2024-01-25

## 2023-12-11 NOTE — Progress Notes (Signed)
Patient ID: Wendy Newman, female   DOB: 1941/03/02, 83 y.o.   MRN: [id number]    Chief Complaint  Patient presents with   Post-op Follow-up    Right knee 06/16/23 states it still feels tight and has numbness in it     83 year old female postop from her right total knee valgus knee.  She complains of tightness and tenderness along the iliotibial band of the right knee and some quadriceps soreness at the patellar insertion.  She still using her cane.  Otherwise doing well  Exam shows tenderness over the ITB starting at Wendy Newman's tubercle and approximately halfway up the thigh.  Range of motion slight flexion contracture maybe 2 or 3 degrees flexion is 100 degrees and it gets tight I can get it to 110 degrees.  Encounter Diagnoses  Name Primary?   S/P total knee replacement, right 06/16/23    Iliotibial band syndrome affecting right lower leg Yes    Recommend physical therapy for ITB stretching and naproxen to help with inflammation  Meds ordered this encounter  Medications   naproxen (NAPROSYN) 500 MG tablet    Sig: Take 1 tablet (500 mg total) by mouth 2 (two) times daily with a meal.    Dispense:  60 tablet    Refill:  2   Follow-up for 1 year x-ray follow-up

## 2023-12-11 NOTE — Patient Instructions (Signed)
Physical therapy has been ordered for you at [facility]. They should call you to schedule, [phone] is the phone number to call, if you want to call to schedule.

## 2024-01-04 ENCOUNTER — Encounter (HOSPITAL_COMMUNITY): Payer: Self-pay

## 2024-01-04 ENCOUNTER — Other Ambulatory Visit: Payer: Self-pay

## 2024-01-04 ENCOUNTER — Ambulatory Visit (HOSPITAL_COMMUNITY): Payer: 59 | Attending: Orthopedic Surgery

## 2024-01-04 DIAGNOSIS — M7631 Iliotibial band syndrome, right leg: Secondary | ICD-10-CM | POA: Insufficient documentation

## 2024-01-04 DIAGNOSIS — M6281 Muscle weakness (generalized): Secondary | ICD-10-CM | POA: Diagnosis not present

## 2024-01-04 DIAGNOSIS — M25661 Stiffness of right knee, not elsewhere classified: Secondary | ICD-10-CM | POA: Diagnosis not present

## 2024-01-04 DIAGNOSIS — R262 Difficulty in walking, not elsewhere classified: Secondary | ICD-10-CM | POA: Insufficient documentation

## 2024-01-04 NOTE — Therapy (Signed)
 OUTPATIENT PHYSICAL THERAPY LOWER EXTREMITY EVALUATION   Patient Name: Wendy Newman MRN: [id number] DOB:08/26/1941, 83 y.o., female Today's Date: 01/04/2024  END OF SESSION:  PT End of Session - 01/04/24 1435     Visit Number 1    Date for PT Re-Evaluation 02/15/24    Authorization Type [employer] Dual Complete    Authorization Time Period no auth; no limit    Progress Note Due on Visit 10    PT Start Time 1345    PT Stop Time 1427    PT Time Calculation (min) 42 min    Activity Tolerance Patient tolerated treatment well    Behavior During Therapy WFL for tasks assessed/performed             Past Medical History:  Diagnosis Date   Anxiety    Cataract    DVT (deep venous thrombosis) (HCC)    Edema of both legs    Essential hypertension    Osteoporosis    PONV (postoperative nausea and vomiting)    Vitamin D deficiency    Past Surgical History:  Procedure Laterality Date   ABDOMINAL HYSTERECTOMY     Complete   CATARACT EXTRACTION Bilateral    CESAREAN SECTION     X 3   COLONOSCOPY  10/10/2008   BJY:[id number]   COLONOSCOPY WITH PROPOFOL N/A 12/21/2014   Dr. Rourk:noncompliant left colon and redundant right colon, rectal and colonic mucosa appeared normal.    TOTAL KNEE ARTHROPLASTY Right 06/16/2023   Procedure: TOTAL KNEE ARTHROPLASTY;  Surgeon: Vickki Hearing, MD;  Location: [facility];  Service: Orthopedics;  Laterality: Right;   Patient Active Problem List   Diagnosis Date Noted   Nausea & vomiting 06/17/2023   Osteoarthritis of right knee 06/16/2023   Acute deep vein thrombosis (DVT) of popliteal vein of left lower extremity (HCC) 10/02/2021   Allergic rhinoconjunctivitis 07/02/2017   Family history of colonic polyps 11/28/2014   Family history of colon cancer 11/28/2014   Constipation 11/28/2014   Vitamin D deficiency 06/16/2013   Hypertension    Anxiety    Osteoporosis    Cataract    Hyperlipemia 05/18/2009   Anxiety state 05/18/2009   NECK PAIN  05/18/2009   ALLERGIC RHINITIS 08/28/2008   OSTEOARTHRITIS 08/28/2008   POSTMENOPAUSAL OSTEOPOROSIS 08/28/2008   PAT 04/24/2008   OTHER ABNORMAL BLOOD CHEMISTRY 04/24/2008   Essential hypertension 12/16/2007   HEADACHE 12/16/2007    PCP: Donita Brooks, MD  REFERRING PROVIDER: Vickki Hearing, MD  REFERRING DIAG: M76.31 (ICD-10-CM) - Iliotibial band syndrome affecting right lower leg   THERAPY DIAG:  Iliotibial band syndrome affecting right lower leg  Decreased range of motion (ROM) of right knee  Muscle weakness (generalized)  Rationale for Evaluation and Treatment: Rehabilitation  ONSET DATE: 5 months ago  SUBJECTIVE:   SUBJECTIVE STATEMENT: Pt reports tightness in R latearl knee with tenderness to touch along R lateral thigh. She reports its difficult to remember when it started or its onset.   PERTINENT HISTORY: R TKA 8/24.  PAIN:  Are you having pain? Yes: NPRS scale: 7/10 Pain location: R lateral knee Pain description: sharp Aggravating factors: Transitions Relieving factors: walking around  PRECAUTIONS: None  RED FLAGS: None   WEIGHT BEARING RESTRICTIONS: No  FALLS:  Has patient fallen in last 6 months? No   PATIENT GOALS: "get out of pain"  NEXT MD VISIT: August   OBJECTIVE:  Note: Objective measures were completed at Evaluation unless otherwise noted.  DIAGNOSTIC FINDINGS:  PATIENT SURVEYS:  LEFS 64/80 = 80%   COGNITION: Overall cognitive status: Within functional limits for tasks assessed     SENSATION: WFL   POSTURE: rounded shoulders and forward head  PALPATION: Tender to palpate on gurdys tubercle and traveling proximal along R lateral right  LOWER EXTREMITY ROM:  Active ROM Right eval Left eval  Hip flexion    Hip extension    Hip abduction    Hip adduction    Hip internal rotation    Hip external rotation    Knee flexion 115 140  Knee extension -6 0  Ankle dorsiflexion 0 2  Ankle plantarflexion    Ankle  inversion    Ankle eversion     (Blank rows = not tested)  LOWER EXTREMITY MMT:  MMT Right eval Left eval  Hip flexion 3+ 4-  Hip extension 3+ 3+  Hip abduction 3+ 3+  Hip adduction    Hip internal rotation    Hip external rotation    Knee flexion 3- 3-  Knee extension 4- 4-  Ankle dorsiflexion    Ankle plantarflexion    Ankle inversion    Ankle eversion     (Blank rows = not tested)   FUNCTIONAL TESTS:  30 seconds chair stand test: 7x   GAIT: Distance walked: 36ft Assistive device utilized: Single point cane Level of assistance: Modified independence Comments: Antalgia through RLE with bilateral knee valgus and constant R knee flexion through stance and swing phase.                                                                                                                                 TREATMENT DATE:   01/04/2024 PT Evaluation and HEP   PATIENT EDUCATION:  Education details: PT Evaluation, findings, prognosis, frequency, attendance policy, and HEP below. Person educated: Patient Education method: Medical illustrator Education comprehension: verbalized understanding  HOME EXERCISE PROGRAM: Access Code: [email or web address] URL: [email or web address] Date: 01/04/2024 Prepared by: Starling Manns  Exercises - Supine Bridge with Resistance Band  - 1 x daily - 7 x weekly - 3 sets - 12-15 reps - 3 hold - Clamshell with Resistance  - 1 x daily - 7 x weekly - 3 sets - 12-15 reps - 3 hold - Standing Ankle Dorsiflexion Stretch  - 1 x daily - 7 x weekly - 3 sets - 10 reps - 30 hold  ASSESSMENT:  CLINICAL IMPRESSION: Patient is a 83 y.o. female who was seen today for physical therapy evaluation and treatment for M76.31 (ICD-10-CM) - Iliotibial band syndrome affecting right lower leg.   Pt with previous R TKR within the year and present with R knee flexion, limited to lacking 6 degrees from 0. Pt demonstrating limitations in functional mobility,  transfers, safety concerns due to muscle weakness, ROM deficits, and RLE pain. Pt will benefit from skilled Physical Therapy services to address deficits/limitations in order to improve functional and QOL.  OBJECTIVE IMPAIRMENTS: Abnormal gait, decreased activity tolerance, decreased balance, decreased mobility, difficulty walking, decreased ROM, decreased strength, postural dysfunction, and pain.   ACTIVITY LIMITATIONS: carrying, lifting, bending, standing, transfers, and locomotion level  PARTICIPATION LIMITATIONS: shopping, community activity, occupation, and yard work  PERSONAL FACTORS: Age, Behavior pattern, and Time since onset of injury/illness/exacerbation are also affecting patient's functional outcome.   REHAB POTENTIAL: Good  CLINICAL DECISION MAKING: Stable/uncomplicated  EVALUATION COMPLEXITY: Low   GOALS: Goals reviewed with patient? No  SHORT TERM GOALS: Target date: 01/25/2024  Pt will be independent with HEP in order to demonstrate participation in Physical Therapy POC.  Baseline: Goal status: INITIAL  2.  Pt will 6/10 pain during mobility in order to demonstrate improved pain while performing ADLs.  Baseline:  Goal status: INITIAL  LONG TERM GOALS: Target date: 02/15/2024  Pt will improve 30 Second Chair Stand Test by 2 in order to demonstrate improved functional strength to return to desired activities.  Baseline: See objective.  Goal status: INITIAL   2.  Pt will improve LEFS score by 8 points in order to demonstrate improved pain with functional goals and outcomes. Baseline: See objective.  Goal status: INITIAL  3.  Pt will improve R knee ROM  by 0-120 in order to improve mechanics and efficiency during functional activities.. Baseline: See objective.  Goal status: INITIAL  4.  Pt will improve BLE MMT by at least 1/2 grade in order to improve safety concerns and reduce fall risks during functional activities.. Baseline: See objective.  Goal status:  INITIAL   PLAN:  PT FREQUENCY: 2x/week  PT DURATION: 6 weeks  PLANNED INTERVENTIONS: 97164- PT Re-evaluation, 97110-Therapeutic exercises, 97530- Therapeutic activity, [id number]- Neuromuscular re-education, 97535- Self Care, 16109- Manual therapy, [id number]- Gait training, [phone]- Electrical stimulation (unattended), [phone]- Electrical stimulation (manual), [id number]- Traction (mechanical), Balance training, Stair training, Taping, Dry Needling, Joint mobilization, Joint manipulation, Spinal manipulation, Spinal mobilization, Cryotherapy, and Moist heat  PLAN FOR NEXT SESSION: R knee ROM, gluteal strengthening, and focus on gaining lacking knee extension.  Nelida Meuse PT, DPT [facility] [phone] office   Nelida Meuse, PT 01/04/2024, 2:37 PM

## 2024-01-04 NOTE — Therapy (Deleted)
 OUTPATIENT PHYSICAL THERAPY LOWER EXTREMITY EVALUATION   Patient Name: Wendy Newman MRN: [id number] DOB:11-12-1940, 83 y.o., female Today's Date: 01/04/2024  END OF SESSION:   Past Medical History:  Diagnosis Date   Anxiety    Cataract    DVT (deep venous thrombosis) (HCC)    Edema of both legs    Essential hypertension    Osteoporosis    PONV (postoperative nausea and vomiting)    Vitamin D deficiency    Past Surgical History:  Procedure Laterality Date   ABDOMINAL HYSTERECTOMY     Complete   CATARACT EXTRACTION Bilateral    CESAREAN SECTION     X 3   COLONOSCOPY  10/10/2008   WUJ:[id number]   COLONOSCOPY WITH PROPOFOL N/A 12/21/2014   Dr. Rourk:noncompliant left colon and redundant right colon, rectal and colonic mucosa appeared normal.    TOTAL KNEE ARTHROPLASTY Right 06/16/2023   Procedure: TOTAL KNEE ARTHROPLASTY;  Surgeon: Vickki Hearing, MD;  Location: [facility];  Service: Orthopedics;  Laterality: Right;   Patient Active Problem List   Diagnosis Date Noted   Nausea & vomiting 06/17/2023   Osteoarthritis of right knee 06/16/2023   Acute deep vein thrombosis (DVT) of popliteal vein of left lower extremity (HCC) 10/02/2021   Allergic rhinoconjunctivitis 07/02/2017   Family history of colonic polyps 11/28/2014   Family history of colon cancer 11/28/2014   Constipation 11/28/2014   Vitamin D deficiency 06/16/2013   Hypertension    Anxiety    Osteoporosis    Cataract    Hyperlipemia 05/18/2009   Anxiety state 05/18/2009   NECK PAIN 05/18/2009   ALLERGIC RHINITIS 08/28/2008   OSTEOARTHRITIS 08/28/2008   POSTMENOPAUSAL OSTEOPOROSIS 08/28/2008   PAT 04/24/2008   OTHER ABNORMAL BLOOD CHEMISTRY 04/24/2008   Essential hypertension 12/16/2007   HEADACHE 12/16/2007    PCP: Donita Brooks, MD  REFERRING PROVIDER: Vickki Hearing, MD  REFERRING DIAG: M76.31 (ICD-10-CM) - Iliotibial band syndrome affecting right lower leg   THERAPY DIAG:  No  diagnosis found.  Rationale for Evaluation and Treatment: Rehabilitation  ONSET DATE: ***  SUBJECTIVE:   SUBJECTIVE STATEMENT: ***  PERTINENT HISTORY: *** PAIN:  Are you having pain? {OPRCPAIN:27236}  PRECAUTIONS: {Therapy precautions:24002}  RED FLAGS: {PT Red Flags:29287}   WEIGHT BEARING RESTRICTIONS: {Yes ***/No:24003}  FALLS:  Has patient fallen in last 6 months? {fallsyesno:27318}   PATIENT GOALS: ***  NEXT MD VISIT: ***  OBJECTIVE:  Note: Objective measures were completed at Evaluation unless otherwise noted.  DIAGNOSTIC FINDINGS: ***  PATIENT SURVEYS:  {rehab surveys:24030}  COGNITION: Overall cognitive status: {cognition:24006}     SENSATION: {sensation:27233}  EDEMA:  {edema:24020}  MUSCLE LENGTH: Hamstrings: Right *** deg; Left *** deg Maisie Fus test: Right *** deg; Left *** deg  POSTURE: {posture:25561}  PALPATION: ***  LOWER EXTREMITY ROM:  Active ROM Right eval Left eval  Hip flexion    Hip extension    Hip abduction    Hip adduction    Hip internal rotation    Hip external rotation    Knee flexion    Knee extension    Ankle dorsiflexion    Ankle plantarflexion    Ankle inversion    Ankle eversion     (Blank rows = not tested)  LOWER EXTREMITY MMT:  MMT Right eval Left eval  Hip flexion    Hip extension    Hip abduction    Hip adduction    Hip internal rotation    Hip external rotation  Knee flexion    Knee extension    Ankle dorsiflexion    Ankle plantarflexion    Ankle inversion    Ankle eversion     (Blank rows = not tested)  LOWER EXTREMITY SPECIAL TESTS:  {LEspecialtests:26242}  FUNCTIONAL TESTS:  {Functional tests:24029}  GAIT: Distance walked: *** Assistive device utilized: {Assistive devices:23999} Level of assistance: {Levels of assistance:24026} Comments: ***                                                                                                                                 TREATMENT DATE: ***    PATIENT EDUCATION:  Education details: PT Evaluation, findings, prognosis, frequency, attendance policy, and ***. Person educated: Patient Education method: Medical illustrator Education comprehension: verbalized understanding  HOME EXERCISE PROGRAM: ***  ASSESSMENT:  CLINICAL IMPRESSION: Patient is a *** y.o. *** who was seen today for physical therapy evaluation and treatment for M76.31 (ICD-10-CM) - Iliotibial band syndrome affecting right lower leg. Pt demonstrating *** due to ***. Pt will benefit from skilled Physical Therapy services to address deficits/limitations in order to improve functional and QOL.    OBJECTIVE IMPAIRMENTS: {opptimpairments:25111}.   ACTIVITY LIMITATIONS: {activitylimitations:27494}  PARTICIPATION LIMITATIONS: {participationrestrictions:25113}  PERSONAL FACTORS: {Personal factors:25162} are also affecting patient's functional outcome.   REHAB POTENTIAL: {rehabpotential:25112}  CLINICAL DECISION MAKING: {clinical decision making:25114}  EVALUATION COMPLEXITY: {Evaluation complexity:25115}   GOALS: Goals reviewed with patient? {yes/no:20286}  SHORT TERM GOALS: Target date: ***  Pt will be independent with HEP in order to demonstrate participation in Physical Therapy POC.  Baseline: Goal status: {GOALSTATUS:25110}  2.  Pt will ***/10 pain during mobility in order to demonstrate improved pain while performing ADLs.  Baseline:  Goal status: {GOALSTATUS:25110}  LONG TERM GOALS: Target date: ***  Pt will improve 30 Second Chair Stand Test by *** in order to demonstrate improved functional strength to return to desired activities.  Baseline: *** Goal status: {GOALSTATUS:25110}  2.  Pt will improve 2 MWT by *** in order to demonstrate improved functional ambulatory capacity in community setting.  Baseline: *** Goal status: {GOALSTATUS:25110}  3.  Pt will improve LEFS score by *** in order to demonstrate  improved pain with functional goals and outcomes. Baseline: *** Goal status: {GOALSTATUS:25110}  4.  Pt will improve *** by *** in order to improve *** during functional activities.. Baseline: *** Goal status: {GOALSTATUS:25110}  5.  Pt will improve *** by *** in order to improve *** during functional activities.. Baseline: *** Goal status: {GOALSTATUS:25110}   PLAN:  PT FREQUENCY: {rehab frequency:25116}  PT DURATION: {rehab duration:25117}  PLANNED INTERVENTIONS: {rehab planned interventions:25118::"97110-Therapeutic exercises","97530- Therapeutic [phone]- Neuromuscular re-education","97535- Self WNUU","72536- Manual therapy"}  PLAN FOR NEXT SESSION: ***   Nelida Meuse, PT 01/04/2024, 12:37 PM

## 2024-01-06 ENCOUNTER — Other Ambulatory Visit: Payer: Self-pay | Admitting: Family Medicine

## 2024-01-06 DIAGNOSIS — I10 Essential (primary) hypertension: Secondary | ICD-10-CM

## 2024-01-06 NOTE — Telephone Encounter (Signed)
 Request too soon for refill,last refill 10/29/23 for 90 and 1 refill.  . Requested Prescriptions  Pending Prescriptions Disp Refills   hydrochlorothiazide (HYDRODIURIL) 25 MG tablet [Pharmacy Med Name: hydroCHLOROthiazide 25 MG Oral Tablet] 100 tablet 2    Sig: TAKE 1 TABLET BY MOUTH DAILY     Cardiovascular: Diuretics - Thiazide Failed - 01/06/2024  3:32 PM      Failed - Cr in normal range and within 180 days    Creat  Date Value Ref Range Status  07/06/2023 0.92 0.60 - 1.00 mg/dL Final         Failed - K in normal range and within 180 days    Potassium  Date Value Ref Range Status  07/06/2023 4.1 3.5 - 5.3 mmol/L Final         Failed - Na in normal range and within 180 days    Sodium  Date Value Ref Range Status  07/06/2023 139 135 - 146 mmol/L Final         Failed - Last BP in normal range    BP Readings from Last 1 Encounters:  07/30/23 (!) 159/88         Failed - Valid encounter within last 6 months    Recent Outpatient Visits           1 year ago Benign essential HTN   [facility] Donita Brooks, MD   2 years ago Acute deep vein thrombosis (DVT) of popliteal vein of left lower extremity (HCC)   [facility] Valentino Nose, NP   2 years ago Pure hypercholesterolemia   [facility] Donita Brooks, MD   2 years ago Ptosis of right eyelid   [facility] Pickard, Priscille Heidelberg, MD   3 years ago Pure hypercholesterolemia   [facility] Pickard, Priscille Heidelberg, MD       Future Appointments             In 1 week Pickard, Priscille Heidelberg, MD [facility]   In 5 months Vickki Hearing, MD [facility]

## 2024-01-07 ENCOUNTER — Encounter (HOSPITAL_COMMUNITY)

## 2024-01-13 ENCOUNTER — Ambulatory Visit (HOSPITAL_COMMUNITY)

## 2024-01-13 DIAGNOSIS — M6281 Muscle weakness (generalized): Secondary | ICD-10-CM | POA: Diagnosis not present

## 2024-01-13 DIAGNOSIS — M7631 Iliotibial band syndrome, right leg: Secondary | ICD-10-CM

## 2024-01-13 DIAGNOSIS — M25661 Stiffness of right knee, not elsewhere classified: Secondary | ICD-10-CM

## 2024-01-13 DIAGNOSIS — R262 Difficulty in walking, not elsewhere classified: Secondary | ICD-10-CM | POA: Diagnosis not present

## 2024-01-13 NOTE — Therapy (Signed)
 OUTPATIENT PHYSICAL THERAPY LOWER EXTREMITY TREATMENT   Patient Name: Wendy Newman MRN: [id number] DOB:1941-05-28, 83 y.o., female Today's Date: 01/13/2024  END OF SESSION:  PT End of Session - 01/13/24 1021     Visit Number 2    Number of Visits 12    Date for PT Re-Evaluation 02/15/24    Authorization Type [employer] Dual Complete    Authorization Time Period no auth; no limit    Progress Note Due on Visit 10    PT Start Time 1020    PT Stop Time 1100    PT Time Calculation (min) 40 min    Activity Tolerance Patient tolerated treatment well    Behavior During Therapy WFL for tasks assessed/performed             Past Medical History:  Diagnosis Date   Anxiety    Cataract    DVT (deep venous thrombosis) (HCC)    Edema of both legs    Essential hypertension    Osteoporosis    PONV (postoperative nausea and vomiting)    Vitamin D deficiency    Past Surgical History:  Procedure Laterality Date   ABDOMINAL HYSTERECTOMY     Complete   CATARACT EXTRACTION Bilateral    CESAREAN SECTION     X 3   COLONOSCOPY  10/10/2008   HYQ:MVHQIONGEX   COLONOSCOPY WITH PROPOFOL N/A 12/21/2014   Dr. Rourk:noncompliant left colon and redundant right colon, rectal and colonic mucosa appeared normal.    TOTAL KNEE ARTHROPLASTY Right 06/16/2023   Procedure: TOTAL KNEE ARTHROPLASTY;  Surgeon: Vickki Hearing, MD;  Location: [facility];  Service: Orthopedics;  Laterality: Right;   Patient Active Problem List   Diagnosis Date Noted   Nausea & vomiting 06/17/2023   Osteoarthritis of right knee 06/16/2023   Acute deep vein thrombosis (DVT) of popliteal vein of left lower extremity (HCC) 10/02/2021   Allergic rhinoconjunctivitis 07/02/2017   Family history of colonic polyps 11/28/2014   Family history of colon cancer 11/28/2014   Constipation 11/28/2014   Vitamin D deficiency 06/16/2013   Hypertension    Anxiety    Osteoporosis    Cataract    Hyperlipemia 05/18/2009   Anxiety state  05/18/2009   NECK PAIN 05/18/2009   ALLERGIC RHINITIS 08/28/2008   OSTEOARTHRITIS 08/28/2008   POSTMENOPAUSAL OSTEOPOROSIS 08/28/2008   PAT 04/24/2008   OTHER ABNORMAL BLOOD CHEMISTRY 04/24/2008   Essential hypertension 12/16/2007   HEADACHE 12/16/2007    PCP: Donita Brooks, MD  REFERRING PROVIDER: Vickki Hearing, MD  REFERRING DIAG: M76.31 (ICD-10-CM) - Iliotibial band syndrome affecting right lower leg   THERAPY DIAG:  Iliotibial band syndrome affecting right lower leg  Decreased range of motion (ROM) of right knee  Muscle weakness (generalized)  Rationale for Evaluation and Treatment: Rehabilitation  ONSET DATE: 5 months ago  SUBJECTIVE:   SUBJECTIVE STATEMENT: Pt reports tightness in R latearl knee with tenderness to touch along R lateral thigh. She reports its difficult to remember when it started or its onset.   PERTINENT HISTORY: R TKA 8/24.  PAIN:  Are you having pain? Yes: NPRS scale: 7/10 Pain location: R lateral knee Pain description: sharp Aggravating factors: Transitions Relieving factors: walking around  PRECAUTIONS: None  RED FLAGS: None   WEIGHT BEARING RESTRICTIONS: No  FALLS:  Has patient fallen in last 6 months? No   PATIENT GOALS: "get out of pain"  NEXT MD VISIT: August   OBJECTIVE:  Note: Objective measures were completed at Evaluation  unless otherwise noted.  DIAGNOSTIC FINDINGS:   PATIENT SURVEYS:  LEFS 64/80 = 80%   COGNITION: Overall cognitive status: Within functional limits for tasks assessed     SENSATION: WFL   POSTURE: rounded shoulders and forward head  PALPATION: Tender to palpate on gurdys tubercle and traveling proximal along R lateral right  LOWER EXTREMITY ROM:  Active ROM Right eval Left eval  Hip flexion    Hip extension    Hip abduction    Hip adduction    Hip internal rotation    Hip external rotation    Knee flexion 115 140  Knee extension -6 0  Ankle dorsiflexion 0 2  Ankle  plantarflexion    Ankle inversion    Ankle eversion     (Blank rows = not tested)  LOWER EXTREMITY MMT:  MMT Right eval Left eval  Hip flexion 3+ 4-  Hip extension 3+ 3+  Hip abduction 3+ 3+  Hip adduction    Hip internal rotation    Hip external rotation    Knee flexion 3- 3-  Knee extension 4- 4-  Ankle dorsiflexion    Ankle plantarflexion    Ankle inversion    Ankle eversion     (Blank rows = not tested)   FUNCTIONAL TESTS:  30 seconds chair stand test: 7x   GAIT: Distance walked: 36ft Assistive device utilized: Single point cane Level of assistance: Modified independence Comments: Antalgia through RLE with bilateral knee valgus and constant R knee flexion through stance and swing phase.                                                                                                                                 TREATMENT DATE:  01/13/24 Review of HEP and goals Supine: Bridge with red theraband 2 x 10 with 3" hold Sidelying clam RTB 2 x 10 Hamstring stretch 3 x 20" Hamstring stretch with strap 10 x 20" Nustep seat 9 x 5' Slant board 5 x 20" Standing: Sidebend stretch/IT band stretch 10" x 10 6" box hamstring stretch 5 x 20" Hip extension 2 x 10 Updated HEP     01/04/2024 PT Evaluation and HEP   PATIENT EDUCATION:  Education details: PT Evaluation, findings, prognosis, frequency, attendance policy, and HEP below. Person educated: Patient Education method: Medical illustrator Education comprehension: verbalized understanding  HOME EXERCISE PROGRAM: 01/13/24 - TL Sidebending Stretch - Single Arm Overhead  - 2 x daily - 7 x weekly - 1 sets - 10 reps - 10 sec hold - Hooklying Hamstring Stretch with Strap  - 2 x daily - 7 x weekly - 1 sets - 10 reps - 20 sec hold - Supine Hamstring Stretch  - 2 x daily - 7 x weekly - 1 sets - 10 reps - 20 sec hold  Access Code: [email or web address] URL: [email or web address] Date: 01/04/2024 Prepared  by: Starling Manns  Exercises - Supine Bridge with Resistance Band  -  1 x daily - 7 x weekly - 3 sets - 12-15 reps - 3 hold - Clamshell with Resistance  - 1 x daily - 7 x weekly - 3 sets - 12-15 reps - 3 hold - Standing Ankle Dorsiflexion Stretch  - 1 x daily - 7 x weekly - 3 sets - 10 reps - 30 hold  ASSESSMENT:  CLINICAL IMPRESSION: Today's session started with a review of goals and HEP.  Patient verbalizes agreement with set rehab goals.  Continued with stretching and strengthening of lower extremities. Needs cues with hip extension to avoid trunk substitution and for correct form as she tends to extend an abduct hip at the same time.  Updated HEP.  Patient will benefit from continued skilled therapy servicesto address deficits and promote return to optimal function.        Eval:Patient is a 83 y.o. female who was seen today for physical therapy evaluation and treatment for M76.31 (ICD-10-CM) - Iliotibial band syndrome affecting right lower leg.   Pt with previous R TKR within the year and present with R knee flexion, limited to lacking 6 degrees from 0. Pt demonstrating limitations in functional mobility, transfers, safety concerns due to muscle weakness, ROM deficits, and RLE pain. Pt will benefit from skilled Physical Therapy services to address deficits/limitations in order to improve functional and QOL.    OBJECTIVE IMPAIRMENTS: Abnormal gait, decreased activity tolerance, decreased balance, decreased mobility, difficulty walking, decreased ROM, decreased strength, postural dysfunction, and pain.   ACTIVITY LIMITATIONS: carrying, lifting, bending, standing, transfers, and locomotion level  PARTICIPATION LIMITATIONS: shopping, community activity, occupation, and yard work  PERSONAL FACTORS: Age, Behavior pattern, and Time since onset of injury/illness/exacerbation are also affecting patient's functional outcome.   REHAB POTENTIAL: Good  CLINICAL DECISION MAKING:  Stable/uncomplicated  EVALUATION COMPLEXITY: Low   GOALS: Goals reviewed with patient? No  SHORT TERM GOALS: Target date: 01/25/2024  Pt will be independent with HEP in order to demonstrate participation in Physical Therapy POC.  Baseline: Goal status: IN PROGRESS  2.  Pt will 6/10 pain during mobility in order to demonstrate improved pain while performing ADLs.  Baseline:  Goal status: IN PROGRESS  LONG TERM GOALS: Target date: 02/15/2024  Pt will improve 30 Second Chair Stand Test by 2 in order to demonstrate improved functional strength to return to desired activities.  Baseline: See objective. 7x at eval Goal status: IN PROGRESS   2.  Pt will improve LEFS score by 8 points in order to demonstrate improved pain with functional goals and outcomes. Baseline: See objective.  Goal status: IN PROGRESS  3.  Pt will improve R knee ROM  by 0-120 in order to improve mechanics and efficiency during functional activities.. Baseline: See objective.  Goal status: IN PROGRESS  4.  Pt will improve BLE MMT by at least 1/2 grade in order to improve safety concerns and reduce fall risks during functional activities.. Baseline: See objective.  Goal status: IN PROGRESS   PLAN:  PT FREQUENCY: 2x/week  PT DURATION: 6 weeks  PLANNED INTERVENTIONS: 97164- PT Re-evaluation, 97110-Therapeutic exercises, 97530- Therapeutic activity, [id number]- Neuromuscular re-education, 97535- Self Care, [id number]- Manual therapy, [id number]- Gait training, [phone]- Electrical stimulation (unattended), [phone]- Electrical stimulation (manual), [id number]- Traction (mechanical), Balance training, Stair training, Taping, Dry Needling, Joint mobilization, Joint manipulation, Spinal manipulation, Spinal mobilization, Cryotherapy, and Moist heat  PLAN FOR NEXT SESSION: R knee ROM, gluteal strengthening, and focus on gaining lacking knee extension.  11:06 AM, 01/13/24 Kellina Dreese Small Esmee Fallaw MPT [facility]  [facility] [place]  [phone]

## 2024-01-14 ENCOUNTER — Ambulatory Visit: Payer: Medicare PPO | Admitting: Family Medicine

## 2024-01-17 ENCOUNTER — Encounter (HOSPITAL_COMMUNITY): Payer: Self-pay

## 2024-01-17 ENCOUNTER — Other Ambulatory Visit: Payer: Self-pay

## 2024-01-17 ENCOUNTER — Emergency Department (HOSPITAL_COMMUNITY)
Admission: EM | Admit: 2024-01-17 | Discharge: 2024-01-17 | Disposition: A | Discharge status: Home / Self Care | Attending: Emergency Medicine | Admitting: Emergency Medicine

## 2024-01-17 ENCOUNTER — Emergency Department (HOSPITAL_COMMUNITY)

## 2024-01-17 DIAGNOSIS — Z79899 Other long term (current) drug therapy: Secondary | ICD-10-CM | POA: Insufficient documentation

## 2024-01-17 DIAGNOSIS — R319 Hematuria, unspecified: Secondary | ICD-10-CM | POA: Insufficient documentation

## 2024-01-17 DIAGNOSIS — N390 Urinary tract infection, site not specified: Secondary | ICD-10-CM | POA: Diagnosis not present

## 2024-01-17 DIAGNOSIS — D72829 Elevated white blood cell count, unspecified: Secondary | ICD-10-CM | POA: Diagnosis not present

## 2024-01-17 DIAGNOSIS — I10 Essential (primary) hypertension: Secondary | ICD-10-CM | POA: Diagnosis not present

## 2024-01-17 DIAGNOSIS — R109 Unspecified abdominal pain: Secondary | ICD-10-CM | POA: Diagnosis not present

## 2024-01-17 DIAGNOSIS — M549 Dorsalgia, unspecified: Secondary | ICD-10-CM | POA: Diagnosis present

## 2024-01-17 LAB — BASIC METABOLIC PANEL
Anion gap: 9 (ref 5–15)
BUN: 21 mg/dL (ref 8–23)
CO2: 28 mmol/L (ref 22–32)
Calcium: 9 mg/dL (ref 8.9–10.3)
Chloride: 101 mmol/L (ref 98–111)
Creatinine, Ser: 0.96 mg/dL (ref 0.44–1.00)
GFR, Estimated: 60 mL/min — ABNORMAL LOW (ref >60)
Glucose, Bld: 94 mg/dL (ref 70–99)
Potassium: 3.5 mmol/L (ref 3.5–5.1)
Sodium: 138 mmol/L (ref 135–145)

## 2024-01-17 LAB — CBC WITH DIFFERENTIAL/PLATELET
Abs Immature Granulocytes: 0.04 10*3/uL (ref 0.00–0.07)
Basophils Absolute: 0 10*3/uL (ref 0.0–0.1)
Basophils Relative: 0 %
Eosinophils Absolute: 0.1 10*3/uL (ref 0.0–0.5)
Eosinophils Relative: 1 %
HCT: 39.1 % (ref 36.0–46.0)
Hemoglobin: 12.7 g/dL (ref 12.0–15.0)
Immature Granulocytes: 0 %
Lymphocytes Relative: 24 %
Lymphs Abs: 2.9 10*3/uL (ref 0.7–4.0)
MCH: 29.8 pg (ref 26.0–34.0)
MCHC: 32.5 g/dL (ref 30.0–36.0)
MCV: 91.8 fL (ref 80.0–100.0)
Monocytes Absolute: 1.1 10*3/uL — ABNORMAL HIGH (ref 0.1–1.0)
Monocytes Relative: 9 %
Neutro Abs: 7.9 10*3/uL — ABNORMAL HIGH (ref 1.7–7.7)
Neutrophils Relative %: 66 %
Platelets: 217 10*3/uL (ref 150–400)
RBC: 4.26 MIL/uL (ref 3.87–5.11)
RDW: 14 % (ref 11.5–15.5)
WBC: 12 10*3/uL — ABNORMAL HIGH (ref 4.0–10.5)
nRBC: 0 % (ref 0.0–0.2)

## 2024-01-17 LAB — URINALYSIS, W/ REFLEX TO CULTURE (INFECTION SUSPECTED)
Bilirubin Urine: NEGATIVE
Glucose, UA: NEGATIVE mg/dL
Hgb urine dipstick: NEGATIVE
Ketones, ur: NEGATIVE mg/dL
Nitrite: NEGATIVE
Protein, ur: 100 mg/dL — AB
Specific Gravity, Urine: 1.018 (ref 1.005–1.030)
WBC, UA: >50 WBC/hpf (ref 0–5)
pH: 7 (ref 5.0–8.0)

## 2024-01-17 MED ORDER — CEPHALEXIN 500 MG PO CAPS: 500.0000 mg | ORAL_CAPSULE | Freq: Once | ORAL | Status: DC

## 2024-01-17 MED ORDER — ACETAMINOPHEN 500 MG PO TABS
1000.0000 mg | ORAL_TABLET | Freq: Once | ORAL | Status: AC
  Administered 2024-01-17: 1000 mg via ORAL
  Filled 2024-01-17: qty 2

## 2024-01-17 MED ORDER — CEFUROXIME AXETIL 250 MG PO TABS
500.0000 mg | ORAL_TABLET | Freq: Once | ORAL | Status: AC
  Administered 2024-01-17: 500 mg via ORAL
  Filled 2024-01-17: qty 2

## 2024-01-17 MED ORDER — CEFPODOXIME PROXETIL 200 MG PO TABS: 200.0000 mg | ORAL_TABLET | Freq: Two times a day (BID) | ORAL | 0 refills | Status: AC

## 2024-01-17 MED ORDER — POLYETHYLENE GLYCOL 3350 17 G PO PACK: 17.0000 g | PACK | Freq: Every day | ORAL | 0 refills | Status: AC

## 2024-01-17 NOTE — ED Triage Notes (Signed)
 Pt reports right side lower back pain and is concerned it may be her kidney's because she thinks she is going to the bathroom more often.

## 2024-01-17 NOTE — Discharge Instructions (Addendum)
 You are seen in the emergency room today for back pain and burning with urination.  You do have a urinary tract infection.  You have been prescribed antibiotics to treat this infection. Your CT scan showed that you are constipated.  I am prescribing MiraLAX to start taking daily.  Follow-up closely with your primary care doctor.  Come back to the ER for new or worsening symptoms.

## 2024-01-17 NOTE — ED Provider Notes (Signed)
 [facility] EMERGENCY DEPARTMENT AT [facility] Provider Note   CSN: [id number] Arrival date & time: 01/17/24  1615     History  Chief Complaint  Patient presents with   Back Pain    Wendy Newman is a 83 y.o. female.  She has PMH of hyperlipidemia, hypertension, anxiety, past history of DVT.  Presents to ER complaining of right low back pain for the past 2 days and urinary frequency and occasional dysuria.  She is worried about possible urinary tract infection.  Denies injury or trauma, no numbness ting or weakness, no urinary incontinence, no saddle anesthesia or paresthesia.  No headache ,fevers or chills, she has not had nausea or vomiting.   Back Pain      Home Medications Prior to Admission medications   Medication Sig Start Date End Date Taking? Authorizing Provider  cefpodoxime (VANTIN) 200 MG tablet Take 1 tablet (200 mg total) by mouth 2 (two) times daily for 7 days. 01/17/24 01/24/24 Yes Toyia Jelinek A, PA-C  polyethylene glycol (MIRALAX) 17 g packet Take 17 g by mouth daily. 01/17/24  Yes Winna Golla A, PA-C  alendronate (FOSAMAX) 70 MG tablet Take 1 tablet (70 mg total) by mouth every 7 (seven) days. Take with a full glass of water on an empty stomach. 05/19/23   Donita Brooks, MD  atorvastatin (LIPITOR) 20 MG tablet Take 1 tablet (20 mg total) by mouth daily. 05/05/23   Donita Brooks, MD  bisacodyl (DULCOLAX) 5 MG EC tablet Take 1 tablet (5 mg total) by mouth daily as needed for moderate constipation. 06/18/23   Vickki Hearing, MD  CALCIUM-MAGNESIUM-ZINC-VIT D3 PO Take 1 tablet by mouth daily.    [provider]  citalopram (CELEXA) 20 MG tablet Take 1 tablet (20 mg total) by mouth daily. 05/05/23   Donita Brooks, MD  docusate sodium (COLACE) 100 MG capsule Take 1 capsule (100 mg total) by mouth 2 (two) times daily. 06/18/23   Vickki Hearing, MD  fluticasone (FLONASE) 50 MCG/ACT nasal spray Place 2 sprays into both nostrils daily.  05/19/23   Donita Brooks, MD  hydrochlorothiazide (HYDRODIURIL) 25 MG tablet Take 1 tablet (25 mg total) by mouth daily. 10/29/23   Donita Brooks, MD  HYDROcodone-acetaminophen (NORCO) 10-325 MG tablet Take 1 tablet by mouth every 4 (four) hours as needed. 07/06/23   Donita Brooks, MD  levocetirizine (XYZAL) 5 MG tablet TAKE 1 TABLET BY MOUTH DAILY IN  THE EVENING 12/03/23   Donita Brooks, MD  methocarbamol (ROBAXIN) 500 MG tablet Take 1 tablet (500 mg total) by mouth every 6 (six) hours as needed for muscle spasms. 06/18/23   Vickki Hearing, MD  naproxen (NAPROSYN) 500 MG tablet Take 1 tablet (500 mg total) by mouth 2 (two) times daily with a meal. 12/11/23   Vickki Hearing, MD  ondansetron (ZOFRAN) 4 MG tablet Take 1 tablet (4 mg total) by mouth every 6 (six) hours. 06/18/23   Vickki Hearing, MD      Allergies    Patient has no known allergies.    Review of Systems   Review of Systems  Musculoskeletal:  Positive for back pain.    Physical Exam Updated Vital Signs BP (!) 150/75 (BP Location: Right Arm)   Pulse 71   Temp 98.9 F (37.2 C) (Oral)   Resp 16   Ht 5\' 6"  (1.676 m)   Wt 74.8 kg   SpO2 99%  BMI 26.63 kg/m  Physical Exam Vitals and nursing note reviewed.  Constitutional:      General: She is not in acute distress.    Appearance: She is well-developed.  HENT:     Head: Normocephalic and atraumatic.     Mouth/Throat:     Mouth: Mucous membranes are moist.  Eyes:     Extraocular Movements: Extraocular movements intact.     Conjunctiva/sclera: Conjunctivae normal.     Pupils: Pupils are equal, round, and reactive to light.  Cardiovascular:     Rate and Rhythm: Normal rate and regular rhythm.     Heart sounds: No murmur heard. Pulmonary:     Effort: Pulmonary effort is normal. No respiratory distress.     Breath sounds: Normal breath sounds.  Abdominal:     Palpations: Abdomen is soft.     Tenderness: There is no abdominal tenderness. There  is no right CVA tenderness or left CVA tenderness.  Musculoskeletal:        General: No swelling.     Cervical back: Neck supple.       Back:  Skin:    General: Skin is warm and dry.     Capillary Refill: Capillary refill takes less than 2 seconds.  Neurological:     General: No focal deficit present.     Mental Status: She is alert and oriented to person, place, and time.  Psychiatric:        Mood and Affect: Mood normal.     ED Results / Procedures / Treatments   Labs (all labs ordered are listed, but only abnormal results are displayed) Labs Reviewed  URINALYSIS, W/ REFLEX TO CULTURE (INFECTION SUSPECTED) - Abnormal; Notable for the following components:      Result Value   APPearance HAZY (*)    Protein, ur 100 (*)    Leukocytes,Ua MODERATE (*)    Bacteria, UA RARE (*)    Non Squamous Epithelial 0-5 (*)    All other components within normal limits  BASIC METABOLIC PANEL - Abnormal; Notable for the following components:   GFR, Estimated 60 (*)    All other components within normal limits  CBC WITH DIFFERENTIAL/PLATELET - Abnormal; Notable for the following components:   WBC 12.0 (*)    Neutro Abs 7.9 (*)    Monocytes Absolute 1.1 (*)    All other components within normal limits  URINE CULTURE    EKG None  Radiology CT Renal Stone Study Result Date: 01/17/2024 CLINICAL DATA:  Right abdominal flank pain EXAM: CT ABDOMEN AND PELVIS WITHOUT CONTRAST TECHNIQUE: Multidetector CT imaging of the abdomen and pelvis was performed following the standard protocol without IV contrast. RADIATION DOSE REDUCTION: This exam was performed according to the departmental dose-optimization program which includes automated exposure control, adjustment of the mA and/or kV according to patient size and/or use of iterative reconstruction technique. COMPARISON:  None Available. FINDINGS: Lower chest: Faint nonspecific interstitial accentuation at the lung bases. Hepatobiliary: Unremarkable  Pancreas: Unremarkable Spleen: Unremarkable Adrenals/Urinary Tract: Adrenal glands unremarkable. Bilateral fluid density renal cysts. No further imaging workup of these lesions is indicated. No urinary tract calculi identified. Stomach/Bowel: Prominent stool throughout the colon favors constipation. Lax ligament of Treitz with the proximal jejunum lower than the expected location. Vascular/Lymphatic: Atherosclerosis is present, including aortoiliac atherosclerotic disease. Atheromatous plaque observed at the origin of the celiac trunk. Appendix poorly seen but no pericecal inflammatory process is observed. Reproductive: Uterus absent.  Adnexa unremarkable. Other: No supplemental non-categorized findings. Musculoskeletal:  Mild lumbar spondylosis and degenerative disc disease, cannot exclude left subarticular lateral recess stenosis at L3-4 or L4-5. IMPRESSION: 1. Prominent stool throughout the colon favors constipation. 2. No urinary tract calculi identified. 3. Mild lumbar spondylosis and degenerative disc disease, cannot exclude left subarticular lateral recess stenosis at L3-4 or L4-5. 4.  Aortic Atherosclerosis (ICD10-I70.0). Electronically Signed   By: Gaylyn Rong M.D.   On: 01/17/2024 18:18    Procedures Procedures    Medications Ordered in ED Medications  cefUROXime (CEFTIN) tablet 500 mg (has no administration in time range)  acetaminophen (TYLENOL) tablet 1,000 mg (has no administration in time range)    ED Course/ Medical Decision Making/ A&P                                 Medical Decision Making Differential diagnosis includes but not limited to UTI, pyelonephritis, ureterolithiasis, muscle strain, herpes zoster, HNP, compression fracture, other  Course: Patient with right low back pain and UTI symptoms, UA does show UTI, renal function is normal, CBC shows white blood cell count of 12 otherwise normal, UA shows moderate leukocytes, 11-20 red blood cells, greater than 20 white  blood cells and rare bacteria.  There is any contamination but given patient's symptoms we will treat for UTI, urine culture was ordered as well.  Advised on follow-up and return precautions.  Note patient is well-appearing, well-hydrated, ambulates well.  Amount and/or Complexity of Data Reviewed Labs: ordered. Radiology: ordered.  Risk OTC drugs. Prescription drug management.           Final Clinical Impression(s) / ED Diagnoses Final diagnoses:  Urinary tract infection with hematuria, site unspecified    Rx / DC Orders ED Discharge Orders          Ordered    cefpodoxime (VANTIN) 200 MG tablet  2 times daily        01/17/24 1853    polyethylene glycol (MIRALAX) 17 g packet  Daily        01/17/24 1857              Josem Kaufmann 01/17/24 1901    Jacalyn Lefevre, MD 01/17/24 2336

## 2024-01-20 LAB — URINE CULTURE: Culture: >=100000 — AB

## 2024-01-21 ENCOUNTER — Ambulatory Visit (HOSPITAL_COMMUNITY)

## 2024-01-21 ENCOUNTER — Telehealth (HOSPITAL_BASED_OUTPATIENT_CLINIC_OR_DEPARTMENT_OTHER): Payer: Self-pay

## 2024-01-21 DIAGNOSIS — M7631 Iliotibial band syndrome, right leg: Secondary | ICD-10-CM | POA: Diagnosis not present

## 2024-01-21 DIAGNOSIS — M6281 Muscle weakness (generalized): Secondary | ICD-10-CM | POA: Diagnosis not present

## 2024-01-21 DIAGNOSIS — R262 Difficulty in walking, not elsewhere classified: Secondary | ICD-10-CM

## 2024-01-21 DIAGNOSIS — M25661 Stiffness of right knee, not elsewhere classified: Secondary | ICD-10-CM

## 2024-01-21 NOTE — Therapy (Signed)
 OUTPATIENT PHYSICAL THERAPY LOWER EXTREMITY TREATMENT   Patient Name: Wendy Newman MRN: [id number] DOB:June 22, 1941, 83 y.o., female Today's Date: 01/21/2024  END OF SESSION:  PT End of Session - 01/21/24 0954     Visit Number 3    Number of Visits 12    Date for PT Re-Evaluation 02/15/24    Authorization Type [employer] Dual Complete    Authorization Time Period no auth; no limit    Progress Note Due on Visit 10    PT Start Time 0955    PT Stop Time 1040    PT Time Calculation (min) 45 min    Activity Tolerance Patient tolerated treatment well    Behavior During Therapy WFL for tasks assessed/performed             Past Medical History:  Diagnosis Date   Anxiety    Cataract    DVT (deep venous thrombosis) (HCC)    Edema of both legs    Essential hypertension    Osteoporosis    PONV (postoperative nausea and vomiting)    Vitamin D deficiency    Past Surgical History:  Procedure Laterality Date   ABDOMINAL HYSTERECTOMY     Complete   CATARACT EXTRACTION Bilateral    CESAREAN SECTION     X 3   COLONOSCOPY  10/10/2008   NWG:[id number]   COLONOSCOPY WITH PROPOFOL N/A 12/21/2014   Dr. Rourk:noncompliant left colon and redundant right colon, rectal and colonic mucosa appeared normal.    TOTAL KNEE ARTHROPLASTY Right 06/16/2023   Procedure: TOTAL KNEE ARTHROPLASTY;  Surgeon: Vickki Hearing, MD;  Location: [facility];  Service: Orthopedics;  Laterality: Right;   Patient Active Problem List   Diagnosis Date Noted   Nausea & vomiting 06/17/2023   Osteoarthritis of right knee 06/16/2023   Acute deep vein thrombosis (DVT) of popliteal vein of left lower extremity (HCC) 10/02/2021   Allergic rhinoconjunctivitis 07/02/2017   Family history of colonic polyps 11/28/2014   Family history of colon cancer 11/28/2014   Constipation 11/28/2014   Vitamin D deficiency 06/16/2013   Hypertension    Anxiety    Osteoporosis    Cataract    Hyperlipemia 05/18/2009   Anxiety state  05/18/2009   NECK PAIN 05/18/2009   ALLERGIC RHINITIS 08/28/2008   OSTEOARTHRITIS 08/28/2008   POSTMENOPAUSAL OSTEOPOROSIS 08/28/2008   PAT 04/24/2008   OTHER ABNORMAL BLOOD CHEMISTRY 04/24/2008   Essential hypertension 12/16/2007   HEADACHE 12/16/2007    PCP: Donita Brooks, MD  REFERRING PROVIDER: Vickki Hearing, MD  REFERRING DIAG: M76.31 (ICD-10-CM) - Iliotibial band syndrome affecting right lower leg   THERAPY DIAG:  Iliotibial band syndrome affecting right lower leg  Decreased range of motion (ROM) of right knee  Muscle weakness (generalized)  Difficulty in walking, not elsewhere classified  Rationale for Evaluation and Treatment: Rehabilitation  ONSET DATE: 5 months ago  SUBJECTIVE:   SUBJECTIVE STATEMENT: 6/10 pain right lateral knee today; arrives with cane; no new falls  PERTINENT HISTORY: R TKA 8/24.  PAIN:  Are you having pain? Yes: NPRS scale: 7/10 Pain location: R lateral knee Pain description: sharp Aggravating factors: Transitions Relieving factors: walking around  PRECAUTIONS: None  RED FLAGS: None   WEIGHT BEARING RESTRICTIONS: No  FALLS:  Has patient fallen in last 6 months? No   PATIENT GOALS: "get out of pain"  NEXT MD VISIT: August   OBJECTIVE:  Note: Objective measures were completed at Evaluation unless otherwise noted.  DIAGNOSTIC FINDINGS:   PATIENT  SURVEYS:  LEFS 64/80 = 80%   COGNITION: Overall cognitive status: Within functional limits for tasks assessed     SENSATION: WFL   POSTURE: rounded shoulders and forward head  PALPATION: Tender to palpate on gurdys tubercle and traveling proximal along R lateral right  LOWER EXTREMITY ROM:  Active ROM Right eval Left eval  Hip flexion    Hip extension    Hip abduction    Hip adduction    Hip internal rotation    Hip external rotation    Knee flexion 115 140  Knee extension -6 0  Ankle dorsiflexion 0 2  Ankle plantarflexion    Ankle inversion     Ankle eversion     (Blank rows = not tested)  LOWER EXTREMITY MMT:  MMT Right eval Left eval  Hip flexion 3+ 4-  Hip extension 3+ 3+  Hip abduction 3+ 3+  Hip adduction    Hip internal rotation    Hip external rotation    Knee flexion 3- 3-  Knee extension 4- 4-  Ankle dorsiflexion    Ankle plantarflexion    Ankle inversion    Ankle eversion     (Blank rows = not tested)   FUNCTIONAL TESTS:  30 seconds chair stand test: 7x   GAIT: Distance walked: 8ft Assistive device utilized: Single point cane Level of assistance: Modified independence Comments: Antalgia through RLE with bilateral knee valgus and constant R knee flexion through stance and swing phase.                                                                                                                                 TREATMENT DATE:  01/21/24 Nustep seat 8 x 5' dynamic warm up Standing: Heel raises 2 x 10 Slant board 5 x 20" Standing side bend stretch 10" x 10 in // bars Hamstring stretch on 6" step 5 x 20" Seated: Hamstring stretch with strap 10 x 10" Left sidelying right IT band stretch 5 x 20" (Ober's position) Supine IT band stretch with strap 10 x 10" Supine knee extension stretch with 2# weight and heel elevated    01/13/24 Review of HEP and goals Supine: Bridge with red theraband 2 x 10 with 3" hold Sidelying clam RTB 2 x 10 Hamstring stretch 3 x 20" Hamstring stretch with strap 10 x 20" Nustep seat 9 x 5' Slant board 5 x 20" Standing: Sidebend stretch/IT band stretch 10" x 10 6" box hamstring stretch 5 x 20" Hip extension 2 x 10 Updated HEP     01/04/2024 PT Evaluation and HEP   PATIENT EDUCATION:  Education details: PT Evaluation, findings, prognosis, frequency, attendance policy, and HEP below. Person educated: Patient Education method: Explanation and Demonstration Education comprehension: verbalized understanding  HOME EXERCISE PROGRAM: 01/21/24  - Supine ITB  Stretch with Strap  - 1 x daily - 7 x weekly - 1 sets - 10 reps - 10 sec hold -  Sidelying ITB Stretch off Table  - 1 x daily - 7 x weekly - 1 sets - 5 reps - 20 sec hold 01/13/24 - TL Sidebending Stretch - Single Arm Overhead  - 2 x daily - 7 x weekly - 1 sets - 10 reps - 10 sec hold - Hooklying Hamstring Stretch with Strap  - 2 x daily - 7 x weekly - 1 sets - 10 reps - 20 sec hold - Supine Hamstring Stretch  - 2 x daily - 7 x weekly - 1 sets - 10 reps - 20 sec hold  Access Code: [email or web address] URL: [email or web address] Date: 01/04/2024 Prepared by: Starling Manns  Exercises - Supine Bridge with Resistance Band  - 1 x daily - 7 x weekly - 3 sets - 12-15 reps - 3 hold - Clamshell with Resistance  - 1 x daily - 7 x weekly - 3 sets - 12-15 reps - 3 hold - Standing Ankle Dorsiflexion Stretch  - 1 x daily - 7 x weekly - 3 sets - 10 reps - 30 hold  ASSESSMENT:  CLINICAL IMPRESSION:   Today's session with focus on Right side IT band stretching and knee extension mobility. Required one seated rest break during treatment today.   Patient continues with slight knee extension lag.  Added knee extension stretch and 2 additional IT band stretches for HEP.  Continues with generally antalgic gait and decreased gait speed.  Verbalizes understanding of new exercises for home program. Reports knee feels better/looser after treatment.    Patient will benefit from continued skilled therapy servicesto address deficits and promote return to optimal function.        Eval:Patient is a 83 y.o. female who was seen today for physical therapy evaluation and treatment for M76.31 (ICD-10-CM) - Iliotibial band syndrome affecting right lower leg.   Pt with previous R TKR within the year and present with R knee flexion, limited to lacking 6 degrees from 0. Pt demonstrating limitations in functional mobility, transfers, safety concerns due to muscle weakness, ROM deficits, and RLE pain. Pt will benefit from skilled  Physical Therapy services to address deficits/limitations in order to improve functional and QOL.    OBJECTIVE IMPAIRMENTS: Abnormal gait, decreased activity tolerance, decreased balance, decreased mobility, difficulty walking, decreased ROM, decreased strength, postural dysfunction, and pain.   ACTIVITY LIMITATIONS: carrying, lifting, bending, standing, transfers, and locomotion level  PARTICIPATION LIMITATIONS: shopping, community activity, occupation, and yard work  PERSONAL FACTORS: Age, Behavior pattern, and Time since onset of injury/illness/exacerbation are also affecting patient's functional outcome.   REHAB POTENTIAL: Good  CLINICAL DECISION MAKING: Stable/uncomplicated  EVALUATION COMPLEXITY: Low   GOALS: Goals reviewed with patient? No  SHORT TERM GOALS: Target date: 01/25/2024  Pt will be independent with HEP in order to demonstrate participation in Physical Therapy POC.  Baseline: Goal status: IN PROGRESS  2.  Pt will 6/10 pain during mobility in order to demonstrate improved pain while performing ADLs.  Baseline:  Goal status: IN PROGRESS  LONG TERM GOALS: Target date: 02/15/2024  Pt will improve 30 Second Chair Stand Test by 2 in order to demonstrate improved functional strength to return to desired activities.  Baseline: See objective. 7x at eval Goal status: IN PROGRESS   2.  Pt will improve LEFS score by 8 points in order to demonstrate improved pain with functional goals and outcomes. Baseline: See objective.  Goal status: IN PROGRESS  3.  Pt will improve R knee ROM  by 0-120 in order to improve  mechanics and efficiency during functional activities.. Baseline: See objective.  Goal status: IN PROGRESS  4.  Pt will improve BLE MMT by at least 1/2 grade in order to improve safety concerns and reduce fall risks during functional activities.. Baseline: See objective.  Goal status: IN PROGRESS   PLAN:  PT FREQUENCY: 2x/week  PT DURATION: 6  weeks  PLANNED INTERVENTIONS: 97164- PT Re-evaluation, 97110-Therapeutic exercises, 97530- Therapeutic activity, [id number]- Neuromuscular re-education, 97535- Self Care, 16109- Manual therapy, [id number]- Gait training, [phone]- Electrical stimulation (unattended), [phone]- Electrical stimulation (manual), [id number]- Traction (mechanical), Balance training, Stair training, Taping, Dry Needling, Joint mobilization, Joint manipulation, Spinal manipulation, Spinal mobilization, Cryotherapy, and Moist heat  PLAN FOR NEXT SESSION: R knee ROM, gluteal strengthening, and focus on gaining lacking knee extension. Add prone hang next visit  10:40 AM, 01/21/24 Airelle Everding Small Harveen Flesch MPT [place] [facility] [place] [phone]

## 2024-01-21 NOTE — Telephone Encounter (Signed)
 Post ED Visit - Positive Culture Follow-up  Culture report reviewed by antimicrobial stewardship pharmacist: [facility] [x]  [place], [place].D. []  Celedonio Miyamoto, Pharm.D., BCPS AQ-ID []  Garvin Fila, Pharm.D., BCPS []  Georgina Pillion, [address].D., BCPS []  [place], [address].D., BCPS, AAHIVP []  Estella Husk, Pharm.D., BCPS, AAHIVP []  Lysle Pearl, PharmD, BCPS []  Phillips Climes, PharmD, BCPS []  Agapito Games, PharmD, BCPS []  Verlan Friends, PharmD []  Mervyn Gay, PharmD, BCPS []  Vinnie Level, PharmD  [facility] []  Len Childs, PharmD []  Greer Pickerel, PharmD []  Adalberto Cole, PharmD []  Perlie Gold, Rph []  Lonell Face) Jean Rosenthal, PharmD []  Earl Many, PharmD []  Junita Push, PharmD []  Dorna Leitz, PharmD []  Terrilee Files, PharmD []  Lynann Beaver, PharmD []  Keturah Barre, PharmD []  Loralee Pacas, PharmD []  Bernadene Person, PharmD   Positive urine culture Treated with Cefpodoxime, organism sensitive to the same and no further patient follow-up is required at this time.  Sandria Senter 01/21/2024, 9:11 AM

## 2024-01-25 ENCOUNTER — Ambulatory Visit (INDEPENDENT_AMBULATORY_CARE_PROVIDER_SITE_OTHER): Admitting: Family Medicine

## 2024-01-25 ENCOUNTER — Encounter: Payer: Self-pay | Admitting: Family Medicine

## 2024-01-25 VITALS — BP 128/70 | HR 59 | Temp 98.1°F | Ht 66.0 in | Wt 171.6 lb

## 2024-01-25 DIAGNOSIS — M810 Age-related osteoporosis without current pathological fracture: Secondary | ICD-10-CM | POA: Diagnosis not present

## 2024-01-25 DIAGNOSIS — E7800 Pure hypercholesterolemia, unspecified: Secondary | ICD-10-CM

## 2024-01-25 DIAGNOSIS — I10 Essential (primary) hypertension: Secondary | ICD-10-CM

## 2024-01-25 MED ORDER — MELOXICAM 15 MG PO TABS: 15.0000 mg | ORAL_TABLET | Freq: Every day | ORAL | 3 refills | Status: DC

## 2024-01-25 NOTE — Progress Notes (Signed)
 Subjective:    Patient ID: Wendy Newman, female    DOB: [birth year], 83 y.o.   MRN: [id number] Patient is here today for a checkup.  She has been on Fosamax now for at least 5 years.  She has a history of osteoporosis.  Her last bone density test was 3 years ago.  She continues to have pain in her right knee after her knee replacement.  She received physical therapy for this.  Her blood pressure well-controlled.  Does not chest pain shortness of breath or dyspnea on exertion.  She is here today for lab work.  She does complain of pain in the right and left first MCP joints.  She is tender to palpation around these joints.  She has been trying Voltaren gel with minimal relief Past Medical History:  Diagnosis Date   Anxiety    Cataract    DVT (deep venous thrombosis) (HCC)    Edema of both legs    Essential hypertension    Osteoporosis    PONV (postoperative nausea and vomiting)    Vitamin D deficiency    Past Surgical History:  Procedure Laterality Date   ABDOMINAL HYSTERECTOMY     Complete   CATARACT EXTRACTION Bilateral    CESAREAN SECTION     X 3   COLONOSCOPY  10/10/2008   ION:[id number]   COLONOSCOPY WITH PROPOFOL N/A 12/21/2014   Dr. Rourk:noncompliant left colon and redundant right colon, rectal and colonic mucosa appeared normal.    TOTAL KNEE ARTHROPLASTY Right 06/16/2023   Procedure: TOTAL KNEE ARTHROPLASTY;  Surgeon: Vickki Hearing, MD;  Location: [facility];  Service: Orthopedics;  Laterality: Right;   Current Outpatient Medications on File Prior to Visit  Medication Sig Dispense Refill   alendronate (FOSAMAX) 70 MG tablet Take 1 tablet (70 mg total) by mouth every 7 (seven) days. Take with a full glass of water on an empty stomach. 12 tablet 2   atorvastatin (LIPITOR) 20 MG tablet Take 1 tablet (20 mg total) by mouth daily. 90 tablet 1   bisacodyl (DULCOLAX) 5 MG EC tablet Take 1 tablet (5 mg total) by mouth daily as needed for moderate constipation. 90 each 0    CALCIUM-MAGNESIUM-ZINC-VIT D3 PO Take 1 tablet by mouth daily.     citalopram (CELEXA) 20 MG tablet Take 1 tablet (20 mg total) by mouth daily. 90 tablet 1   docusate sodium (COLACE) 100 MG capsule Take 1 capsule (100 mg total) by mouth 2 (two) times daily. 10 capsule 0   fluticasone (FLONASE) 50 MCG/ACT nasal spray Place 2 sprays into both nostrils daily. 48 g 2   hydrochlorothiazide (HYDRODIURIL) 25 MG tablet Take 1 tablet (25 mg total) by mouth daily. 90 tablet 1   HYDROcodone-acetaminophen (NORCO) 10-325 MG tablet Take 1 tablet by mouth every 4 (four) hours as needed. 42 tablet 0   levocetirizine (XYZAL) 5 MG tablet TAKE 1 TABLET BY MOUTH DAILY IN  THE EVENING 100 tablet 1   methocarbamol (ROBAXIN) 500 MG tablet Take 1 tablet (500 mg total) by mouth every 6 (six) hours as needed for muscle spasms. 60 tablet 2   naproxen (NAPROSYN) 500 MG tablet Take 1 tablet (500 mg total) by mouth 2 (two) times daily with a meal. 60 tablet 2   ondansetron (ZOFRAN) 4 MG tablet Take 1 tablet (4 mg total) by mouth every 6 (six) hours. 20 tablet 0   polyethylene glycol (MIRALAX) 17 g packet Take 17 g by mouth daily. 14 each  0   No current facility-administered medications on file prior to visit.   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  Tobacco Use   Smoking status: Never   Smokeless tobacco: Never  Vaping Use   Vaping status: Never Used  Substance and Sexual Activity   Alcohol use: No    Alcohol/week: 0.0 standard drinks of alcohol   Drug use: No   Sexual activity: Yes    Birth control/protection: Surgical  Other Topics Concern   Not on file  Social History Narrative   Lives alone    Does not drive relies on grandson or neighbor for transportation    Social  Drivers of Health   Financial Resource Strain: Low Risk  (10/08/2023)   Overall Financial Resource Strain (CARDIA)    Difficulty of Paying Living Expenses: Not hard at all  Food Insecurity: No Food Insecurity (10/08/2023)   Hunger Vital Sign    Worried About Running Out of Food in the Last Year: Never true    Ran Out of Food in the Last Year: Never true  Transportation Needs: No Transportation Needs (10/08/2023)   PRAPARE - Administrator, Civil Service (Medical): No    Lack of Transportation (Non-Medical): No  Physical Activity: Inactive (10/08/2023)   Exercise Vital Sign    Days of Exercise per Week: 0 days    Minutes of Exercise per Session: 0 min  Stress: No Stress Concern Present (10/08/2023)   [employer] of Occupational Health - Occupational Stress Questionnaire    Feeling of Stress : Not at all  Social Connections: Moderately Isolated (10/08/2023)   Social Connection and Isolation Panel [NHANES]    Frequency of Communication with Friends and Family: More than three times a week    Frequency of Social Gatherings with Friends and Family: Three times a week    Attends Religious Services: More than 4 times per year    Active Member of Clubs or Organizations: No    Attends Banker Meetings: Never    Marital Status: Widowed  Intimate Partner Violence: Not At Risk (10/08/2023)   Humiliation, Afraid, Rape, and Kick questionnaire    Fear of Current or Ex-Partner: No    Emotionally Abused: No    Physically Abused: No    Sexually Abused: No      Review of Systems  All other systems reviewed and are negative.      Objective:   Physical Exam Vitals reviewed.  Constitutional:      General: She is not in acute distress.    Appearance: Normal appearance. She is normal weight. She is not ill-appearing, toxic-appearing or diaphoretic.  HENT:     Head: Normocephalic and atraumatic.     Right Ear: Tympanic membrane, ear canal and external ear  normal. There is no impacted cerumen.     Left Ear: Tympanic membrane, ear canal and external ear normal. There is no impacted cerumen.     Nose: No congestion or rhinorrhea.     Mouth/Throat:     Mouth: Mucous membranes are moist.     Pharynx: Oropharynx is clear. No oropharyngeal exudate or posterior oropharyngeal erythema.  Eyes:     General:  Left eye: No discharge.     Extraocular Movements: Extraocular movements intact.     Conjunctiva/sclera: Conjunctivae normal.     Pupils: Pupils are equal, round, and reactive to light.  Neck:     Vascular: No carotid bruit.  Cardiovascular:     Rate and Rhythm: Normal rate and regular rhythm.     Pulses: Normal pulses.     Heart sounds: Normal heart sounds. No murmur heard.    No friction rub. No gallop.  Pulmonary:     Effort: Pulmonary effort is normal. No respiratory distress.     Breath sounds: Normal breath sounds. No stridor. No wheezing, rhonchi or rales.  Chest:     Chest wall: No tenderness.  Abdominal:     General: Abdomen is flat. Bowel sounds are normal. There is no distension.     Palpations: Abdomen is soft.     Tenderness: There is no abdominal tenderness. There is no guarding or rebound.  Musculoskeletal:        General: Tenderness and deformity present. No swelling.     Cervical back: Normal range of motion and neck supple. No rigidity or tenderness.     Right lower leg: No edema.     Left lower leg: No edema.  Lymphadenopathy:     Cervical: No cervical adenopathy.  Skin:    Coloration: Skin is not jaundiced.     Findings: No bruising, erythema or rash.  Neurological:     General: No focal deficit present.     Mental Status: She is alert and oriented to person, place, and time. Mental status is at baseline.     Cranial Nerves: No cranial nerve deficit.     Sensory: No sensory deficit.     Motor: No weakness.     Coordination: Coordination normal.     Gait: Gait normal.     Deep Tendon Reflexes: Reflexes  normal.  Psychiatric:        Mood and Affect: Mood normal.        Behavior: Behavior normal.        Thought Content: Thought content normal.        Judgment: Judgment normal.           Assessment & Plan:  Benign essential HTN - Plan: CBC with Differential/Platelet, COMPLETE METABOLIC PANEL WITHOUT GFR, Lipid panel  Pure hypercholesterolemia - Plan: CBC with Differential/Platelet, COMPLETE METABOLIC PANEL WITHOUT GFR, Lipid panel  Osteoporosis, unspecified osteoporosis type, unspecified pathological fracture presence - Plan: DG Bone Density In the past, patient took meloxicam for knee pain.  We will trial meloxicam again to see if it helps the pain at the base of the thumb.  Discontinue Fosamax but continue calcium and vitamin D.  Repeat a bone density to get a baseline.  Check this again in 2 years.  Check CBC CMP and fasting lipid panel.  Goal LDL cholesterol is less than 100.  Blood pressure today is well-controlled

## 2024-01-26 ENCOUNTER — Encounter (HOSPITAL_COMMUNITY): Payer: Self-pay

## 2024-01-26 ENCOUNTER — Ambulatory Visit (HOSPITAL_COMMUNITY): Attending: Orthopedic Surgery

## 2024-01-26 DIAGNOSIS — Z96651 Presence of right artificial knee joint: Secondary | ICD-10-CM | POA: Insufficient documentation

## 2024-01-26 DIAGNOSIS — M25661 Stiffness of right knee, not elsewhere classified: Secondary | ICD-10-CM | POA: Insufficient documentation

## 2024-01-26 DIAGNOSIS — G8929 Other chronic pain: Secondary | ICD-10-CM | POA: Diagnosis not present

## 2024-01-26 DIAGNOSIS — M25561 Pain in right knee: Secondary | ICD-10-CM | POA: Insufficient documentation

## 2024-01-26 DIAGNOSIS — M7631 Iliotibial band syndrome, right leg: Secondary | ICD-10-CM | POA: Diagnosis not present

## 2024-01-26 DIAGNOSIS — M6281 Muscle weakness (generalized): Secondary | ICD-10-CM | POA: Diagnosis not present

## 2024-01-26 DIAGNOSIS — R262 Difficulty in walking, not elsewhere classified: Secondary | ICD-10-CM | POA: Insufficient documentation

## 2024-01-26 LAB — CBC WITH DIFFERENTIAL/PLATELET
Absolute Lymphocytes: 2332 {cells}/uL (ref 850–3900)
Absolute Monocytes: 619 {cells}/uL (ref 200–950)
Basophils Absolute: 20 {cells}/uL (ref 0–200)
Basophils Relative: 0.3 %
Eosinophils Absolute: 102 {cells}/uL (ref 15–500)
Eosinophils Relative: 1.5 %
HCT: 37.5 % (ref 35.0–45.0)
Hemoglobin: 12.1 g/dL (ref 11.7–15.5)
MCH: 29.9 pg (ref 27.0–33.0)
MCHC: 32.3 g/dL (ref 32.0–36.0)
MCV: 92.6 fL (ref 80.0–100.0)
MPV: 11.6 fL (ref 7.5–12.5)
Monocytes Relative: 9.1 %
Neutro Abs: 3726 {cells}/uL (ref 1500–7800)
Neutrophils Relative %: 54.8 %
Platelets: 237 10*3/uL (ref 140–400)
RBC: 4.05 10*6/uL (ref 3.80–5.10)
RDW: 13.1 % (ref 11.0–15.0)
Total Lymphocyte: 34.3 %
WBC: 6.8 10*3/uL (ref 3.8–10.8)

## 2024-01-26 LAB — LIPID PANEL
Cholesterol: 151 mg/dL (ref <200)
HDL: 48 mg/dL — ABNORMAL LOW (ref >=50)
LDL Cholesterol (Calc): 85 mg/dL
Non-HDL Cholesterol (Calc): 103 mg/dL (ref <130)
Total CHOL/HDL Ratio: 3.1 (calc) (ref <5.0)
Triglycerides: 87 mg/dL (ref <150)

## 2024-01-26 LAB — COMPLETE METABOLIC PANEL WITHOUT GFR
AG Ratio: 1.8 (calc) (ref 1.0–2.5)
ALT: 9 U/L (ref 6–29)
AST: 16 U/L (ref 10–35)
Albumin: 4.3 g/dL (ref 3.6–5.1)
Alkaline phosphatase (APISO): 51 U/L (ref 37–153)
BUN: 17 mg/dL (ref 7–25)
CO2: 29 mmol/L (ref 20–32)
Calcium: 9.3 mg/dL (ref 8.6–10.4)
Chloride: 104 mmol/L (ref 98–110)
Creat: 0.84 mg/dL (ref 0.60–0.95)
Globulin: 2.4 g/dL (ref 1.9–3.7)
Glucose, Bld: 81 mg/dL (ref 65–99)
Potassium: 3.7 mmol/L (ref 3.5–5.3)
Sodium: 141 mmol/L (ref 135–146)
Total Bilirubin: 0.5 mg/dL (ref 0.2–1.2)
Total Protein: 6.7 g/dL (ref 6.1–8.1)

## 2024-01-26 NOTE — Therapy (Signed)
 OUTPATIENT PHYSICAL THERAPY LOWER EXTREMITY TREATMENT   Patient Name: Wendy Newman MRN: [id number] DOB:1941-07-05, 83 y.o., female Today's Date: 01/26/2024  END OF SESSION:  PT End of Session - 01/26/24 1005     Visit Number 4    Number of Visits 12    Date for PT Re-Evaluation 02/15/24    Authorization Type [employer] Dual Complete    Authorization Time Period no auth; no limit    Progress Note Due on Visit 10    PT Start Time 1010    PT Stop Time 1055    PT Time Calculation (min) 45 min    Activity Tolerance Patient tolerated treatment well    Behavior During Therapy WFL for tasks assessed/performed             Past Medical History:  Diagnosis Date   Anxiety    Cataract    DVT (deep venous thrombosis) (HCC)    Edema of both legs    Essential hypertension    Osteoporosis    PONV (postoperative nausea and vomiting)    Vitamin D deficiency    Past Surgical History:  Procedure Laterality Date   ABDOMINAL HYSTERECTOMY     Complete   CATARACT EXTRACTION Bilateral    CESAREAN SECTION     X 3   COLONOSCOPY  10/10/2008   [id number]   COLONOSCOPY WITH PROPOFOL N/A 12/21/2014   Dr. Rourk:noncompliant left colon and redundant right colon, rectal and colonic mucosa appeared normal.    TOTAL KNEE ARTHROPLASTY Right 06/16/2023   Procedure: TOTAL KNEE ARTHROPLASTY;  Surgeon: Vickki Hearing, MD;  Location: [facility];  Service: Orthopedics;  Laterality: Right;   Patient Active Problem List   Diagnosis Date Noted   Nausea & vomiting 06/17/2023   Osteoarthritis of right knee 06/16/2023   Acute deep vein thrombosis (DVT) of popliteal vein of left lower extremity (HCC) 10/02/2021   Allergic rhinoconjunctivitis 07/02/2017   Family history of colonic polyps 11/28/2014   Family history of colon cancer 11/28/2014   Constipation 11/28/2014   Vitamin D deficiency 06/16/2013   Hypertension    Anxiety    Osteoporosis    Cataract    Hyperlipemia 05/18/2009   Anxiety state  05/18/2009   NECK PAIN 05/18/2009   ALLERGIC RHINITIS 08/28/2008   OSTEOARTHRITIS 08/28/2008   POSTMENOPAUSAL OSTEOPOROSIS 08/28/2008   PAT 04/24/2008   OTHER ABNORMAL BLOOD CHEMISTRY 04/24/2008   Essential hypertension 12/16/2007   HEADACHE 12/16/2007    PCP: Donita Brooks, MD  REFERRING PROVIDER: Vickki Hearing, MD  REFERRING DIAG: M76.31 (ICD-10-CM) - Iliotibial band syndrome affecting right lower leg   THERAPY DIAG:  Iliotibial band syndrome affecting right lower leg  Decreased range of motion (ROM) of right knee  Muscle weakness (generalized)  Rationale for Evaluation and Treatment: Rehabilitation  ONSET DATE: 5 months ago  SUBJECTIVE:   SUBJECTIVE STATEMENT: Knee feels tight today, no reports of pain. 6/10 pain right lateral knee today; arrives with cane; no new falls  PERTINENT HISTORY: R TKA 8/24.  PAIN:  Are you having pain? Yes: NPRS scale: 0/10 Pain location: R lateral knee Pain description: sharp Aggravating factors: Transitions Relieving factors: walking around  PRECAUTIONS: None  RED FLAGS: None   WEIGHT BEARING RESTRICTIONS: No  FALLS:  Has patient fallen in last 6 months? No   PATIENT GOALS: "get out of pain"  NEXT MD VISIT: August   OBJECTIVE:  Note: Objective measures were completed at Evaluation unless otherwise noted.  DIAGNOSTIC FINDINGS:  PATIENT SURVEYS:  LEFS 64/80 = 80%   COGNITION: Overall cognitive status: Within functional limits for tasks assessed     SENSATION: WFL   POSTURE: rounded shoulders and forward head  PALPATION: Tender to palpate on gurdys tubercle and traveling proximal along R lateral right  LOWER EXTREMITY ROM:  Active ROM Right eval Left eval  Hip flexion    Hip extension    Hip abduction    Hip adduction    Hip internal rotation    Hip external rotation    Knee flexion 115 140  Knee extension -6 0  Ankle dorsiflexion 0 2  Ankle plantarflexion    Ankle inversion    Ankle  eversion     (Blank rows = not tested)  LOWER EXTREMITY MMT:  MMT Right eval Left eval  Hip flexion 3+ 4-  Hip extension 3+ 3+  Hip abduction 3+ 3+  Hip adduction    Hip internal rotation    Hip external rotation    Knee flexion 3- 3-  Knee extension 4- 4-  Ankle dorsiflexion    Ankle plantarflexion    Ankle inversion    Ankle eversion     (Blank rows = not tested)   FUNCTIONAL TESTS:  30 seconds chair stand test: 7x   GAIT: Distance walked: 17ft Assistive device utilized: Single point cane Level of assistance: Modified independence Comments: Antalgia through RLE with bilateral knee valgus and constant R knee flexion through stance and swing phase.                                                                                                                                 TREATMENT DATE:  01/26/24 Nustep seat 8 x 5' dynamic warm up [place] SPM > 65  Standing: Heel raise on incline slope 10x 3" 2 sets Toe raises decline slope 10x  Knee drive on 40JW step height for flexion 5x 10" TKE 10x 5" with RTB Sidestep with RTB around thigh 3RT inside // with minimal to no HHA Hamstring stretch 3x 30" 12in step height Slant board 3x 30" IT band stretch by 12in step 2x 30"   Gait training with focus on heel strike, knee flexion with toe push off x 200 ft no AD  Prone: 2 min prone knee hang  01/21/24 Nustep seat 8 x 5' dynamic warm up Standing: Heel raises 2 x 10 Slant board 5 x 20" Standing side bend stretch 10" x 10 in // bars Hamstring stretch on 6" step 5 x 20" Seated: Hamstring stretch with strap 10 x 10" Left sidelying right IT band stretch 5 x 20" (Ober's position) Supine IT band stretch with strap 10 x 10" Supine knee extension stretch with 2# weight and heel elevated    01/13/24 Review of HEP and goals Supine: Bridge with red theraband 2 x 10 with 3" hold Sidelying clam RTB 2 x 10 Hamstring stretch 3 x 20" Hamstring stretch  with strap 10 x  20" Nustep seat 9 x 5' Slant board 5 x 20" Standing: Sidebend stretch/IT band stretch 10" x 10 6" box hamstring stretch 5 x 20" Hip extension 2 x 10 Updated HEP     01/04/2024 PT Evaluation and HEP   PATIENT EDUCATION:  Education details: PT Evaluation, findings, prognosis, frequency, attendance policy, and HEP below. Person educated: Patient Education method: Explanation and Demonstration Education comprehension: verbalized understanding  HOME EXERCISE PROGRAM: 01/21/24  - Supine ITB Stretch with Strap  - 1 x daily - 7 x weekly - 1 sets - 10 reps - 10 sec hold - Sidelying ITB Stretch off Table  - 1 x daily - 7 x weekly - 1 sets - 5 reps - 20 sec hold 01/13/24 - TL Sidebending Stretch - Single Arm Overhead  - 2 x daily - 7 x weekly - 1 sets - 10 reps - 10 sec hold - Hooklying Hamstring Stretch with Strap  - 2 x daily - 7 x weekly - 1 sets - 10 reps - 20 sec hold - Supine Hamstring Stretch  - 2 x daily - 7 x weekly - 1 sets - 10 reps - 20 sec hold  Access Code: [email or web address] URL: [email or web address] Date: 01/04/2024 Prepared by: Starling Manns  Exercises - Supine Bridge with Resistance Band  - 1 x daily - 7 x weekly - 3 sets - 12-15 reps - 3 hold - Clamshell with Resistance  - 1 x daily - 7 x weekly - 3 sets - 12-15 reps - 3 hold - Standing Ankle Dorsiflexion Stretch  - 1 x daily - 7 x weekly - 3 sets - 10 reps - 30 hold  ASSESSMENT:  CLINICAL IMPRESSION: Session focus with knee mobility and strengthening.  Added TKE and prone knee hang to address extension lag as well as sidestep for glut and prone hamstring curls for posterior chain strengthening.  Pt tolerated well to session with no reoprts of pain.  Reviewed gait mechanics to improve heel strike and toe push off to improve knee mobility during gait.       Eval:Patient is a 83 y.o. female who was seen today for physical therapy evaluation and treatment for M76.31 (ICD-10-CM) - Iliotibial band syndrome  affecting right lower leg.   Pt with previous R TKR within the year and present with R knee flexion, limited to lacking 6 degrees from 0. Pt demonstrating limitations in functional mobility, transfers, safety concerns due to muscle weakness, ROM deficits, and RLE pain. Pt will benefit from skilled Physical Therapy services to address deficits/limitations in order to improve functional and QOL.    OBJECTIVE IMPAIRMENTS: Abnormal gait, decreased activity tolerance, decreased balance, decreased mobility, difficulty walking, decreased ROM, decreased strength, postural dysfunction, and pain.   ACTIVITY LIMITATIONS: carrying, lifting, bending, standing, transfers, and locomotion level  PARTICIPATION LIMITATIONS: shopping, community activity, occupation, and yard work  PERSONAL FACTORS: Age, Behavior pattern, and Time since onset of injury/illness/exacerbation are also affecting patient's functional outcome.   REHAB POTENTIAL: Good  CLINICAL DECISION MAKING: Stable/uncomplicated  EVALUATION COMPLEXITY: Low   GOALS: Goals reviewed with patient? No  SHORT TERM GOALS: Target date: 01/25/2024  Pt will be independent with HEP in order to demonstrate participation in Physical Therapy POC.  Baseline: Goal status: IN PROGRESS  2.  Pt will 6/10 pain during mobility in order to demonstrate improved pain while performing ADLs.  Baseline:  Goal status: IN PROGRESS  LONG TERM GOALS: Target date: 02/15/2024  Pt will  improve 30 Second Chair Stand Test by 2 in order to demonstrate improved functional strength to return to desired activities.  Baseline: See objective. 7x at eval Goal status: IN PROGRESS   2.  Pt will improve LEFS score by 8 points in order to demonstrate improved pain with functional goals and outcomes. Baseline: See objective.  Goal status: IN PROGRESS  3.  Pt will improve R knee ROM  by 0-120 in order to improve mechanics and efficiency during functional activities.. Baseline:  See objective.  Goal status: IN PROGRESS  4.  Pt will improve BLE MMT by at least 1/2 grade in order to improve safety concerns and reduce fall risks during functional activities.. Baseline: See objective.  Goal status: IN PROGRESS   PLAN:  PT FREQUENCY: 2x/week  PT DURATION: 6 weeks  PLANNED INTERVENTIONS: 97164- PT Re-evaluation, 97110-Therapeutic exercises, 97530- Therapeutic activity, [id number]- Neuromuscular re-education, 97535- Self Care, 53664- Manual therapy, [id number]- Gait training, [phone]- Electrical stimulation (unattended), [phone]- Electrical stimulation (manual), [id number]- Traction (mechanical), Balance training, Stair training, Taping, Dry Needling, Joint mobilization, Joint manipulation, Spinal manipulation, Spinal mobilization, Cryotherapy, and Moist heat  PLAN FOR NEXT SESSION: R knee ROM, gluteal strengthening, and focus on gaining lacking knee extension. Update HEP  Becky Sax, LPTA/CLT; CBIS [phone]  10:58 AM, 01/26/24

## 2024-01-28 ENCOUNTER — Encounter (HOSPITAL_COMMUNITY)

## 2024-01-29 ENCOUNTER — Ambulatory Visit (HOSPITAL_COMMUNITY)

## 2024-01-29 DIAGNOSIS — M7631 Iliotibial band syndrome, right leg: Secondary | ICD-10-CM | POA: Diagnosis not present

## 2024-01-29 DIAGNOSIS — Z96651 Presence of right artificial knee joint: Secondary | ICD-10-CM | POA: Diagnosis not present

## 2024-01-29 DIAGNOSIS — M6281 Muscle weakness (generalized): Secondary | ICD-10-CM | POA: Diagnosis not present

## 2024-01-29 DIAGNOSIS — R262 Difficulty in walking, not elsewhere classified: Secondary | ICD-10-CM | POA: Diagnosis not present

## 2024-01-29 DIAGNOSIS — G8929 Other chronic pain: Secondary | ICD-10-CM | POA: Diagnosis not present

## 2024-01-29 DIAGNOSIS — M25661 Stiffness of right knee, not elsewhere classified: Secondary | ICD-10-CM

## 2024-01-29 DIAGNOSIS — M25561 Pain in right knee: Secondary | ICD-10-CM | POA: Diagnosis not present

## 2024-01-29 NOTE — Therapy (Signed)
 OUTPATIENT PHYSICAL THERAPY LOWER EXTREMITY TREATMENT   Patient Name: Wendy Newman MRN: [id number] DOB:[birth year], 83 y.o., female Today's Date: 01/29/2024  END OF SESSION:  PT End of Session - 01/29/24 1259     Visit Number 5    Number of Visits 12    Date for PT Re-Evaluation 02/15/24    Authorization Type [employer] Dual Complete    Authorization Time Period no auth; no limit    Progress Note Due on Visit 10    PT Start Time 1300    PT Stop Time 1340    PT Time Calculation (min) 40 min    Activity Tolerance Patient tolerated treatment well    Behavior During Therapy WFL for tasks assessed/performed             Past Medical History:  Diagnosis Date   Anxiety    Cataract    DVT (deep venous thrombosis) (HCC)    Edema of both legs    Essential hypertension    Osteoporosis    PONV (postoperative nausea and vomiting)    Vitamin D deficiency    Past Surgical History:  Procedure Laterality Date   ABDOMINAL HYSTERECTOMY     Complete   CATARACT EXTRACTION Bilateral    CESAREAN SECTION     X 3   COLONOSCOPY  10/10/2008   [id number]   COLONOSCOPY WITH PROPOFOL N/A 12/21/2014   Dr. Rourk:noncompliant left colon and redundant right colon, rectal and colonic mucosa appeared normal.    TOTAL KNEE ARTHROPLASTY Right 06/16/2023   Procedure: TOTAL KNEE ARTHROPLASTY;  Surgeon: Vickki Hearing, MD;  Location: [facility];  Service: Orthopedics;  Laterality: Right;   Patient Active Problem List   Diagnosis Date Noted   Nausea & vomiting 06/17/2023   Osteoarthritis of right knee 06/16/2023   Acute deep vein thrombosis (DVT) of popliteal vein of left lower extremity (HCC) 10/02/2021   Allergic rhinoconjunctivitis 07/02/2017   Family history of colonic polyps 11/28/2014   Family history of colon cancer 11/28/2014   Constipation 11/28/2014   Vitamin D deficiency 06/16/2013   Hypertension    Anxiety    Osteoporosis    Cataract    Hyperlipemia 05/18/2009   Anxiety state  05/18/2009   NECK PAIN 05/18/2009   ALLERGIC RHINITIS 08/28/2008   OSTEOARTHRITIS 08/28/2008   POSTMENOPAUSAL OSTEOPOROSIS 08/28/2008   PAT 04/24/2008   OTHER ABNORMAL BLOOD CHEMISTRY 04/24/2008   Essential hypertension 12/16/2007   HEADACHE 12/16/2007    PCP: Donita Brooks, MD  REFERRING PROVIDER: Vickki Hearing, MD  REFERRING DIAG: M76.31 (ICD-10-CM) - Iliotibial band syndrome affecting right lower leg   THERAPY DIAG:  Iliotibial band syndrome affecting right lower leg  Decreased range of motion (ROM) of right knee  Muscle weakness (generalized)  Difficulty in walking, not elsewhere classified  Status post total right knee replacement  Chronic pain of right knee  Rationale for Evaluation and Treatment: Rehabilitation  ONSET DATE: 5 months ago  SUBJECTIVE:   SUBJECTIVE STATEMENT: Patient with no pain in the right knee on arrival. "Just tight" ; walking without her cane in the house  PERTINENT HISTORY: R TKA 8/24.  PAIN:  Are you having pain? Yes: NPRS scale: 0/10 Pain location: R lateral knee Pain description: sharp Aggravating factors: Transitions Relieving factors: walking around  PRECAUTIONS: None  RED FLAGS: None   WEIGHT BEARING RESTRICTIONS: No  FALLS:  Has patient fallen in last 6 months? No   PATIENT GOALS: "get out of pain"  NEXT MD VISIT:  August   OBJECTIVE:  Note: Objective measures were completed at Evaluation unless otherwise noted.  DIAGNOSTIC FINDINGS:   PATIENT SURVEYS:  LEFS 64/80 = 80%   COGNITION: Overall cognitive status: Within functional limits for tasks assessed     SENSATION: WFL   POSTURE: rounded shoulders and forward head  PALPATION: Tender to palpate on gurdys tubercle and traveling proximal along R lateral right  LOWER EXTREMITY ROM:  Active ROM Right eval Left eval Right 01/29/24  Hip flexion     Hip extension     Hip abduction     Hip adduction     Hip internal rotation     Hip  external rotation     Knee flexion 115 140 122  Knee extension -6 0 -4  Ankle dorsiflexion 0 2   Ankle plantarflexion     Ankle inversion     Ankle eversion      (Blank rows = not tested)  LOWER EXTREMITY MMT:  MMT Right eval Left eval Right 01/29/24  Hip flexion 3+ 4-   Hip extension 3+ 3+   Hip abduction 3+ 3+   Hip adduction     Hip internal rotation     Hip external rotation     Knee flexion 3- 3-   Knee extension 4- 4- 4+  Ankle dorsiflexion     Ankle plantarflexion     Ankle inversion     Ankle eversion      (Blank rows = not tested)   FUNCTIONAL TESTS:  30 seconds chair stand test: 7x   GAIT: Distance walked: 30ft Assistive device utilized: Single point cane Level of assistance: Modified independence Comments: Antalgia through RLE with bilateral knee valgus and constant R knee flexion through stance and swing phase.                                                                                                                                 TREATMENT DATE:  01/29/24 Nustep seat 8 x 5' dynamic warm up   Heel raises on incline 2 x 10 Slant board 5 x 20" Decline toe raises 2 x 10 Standing side bend stretch 10 x 10" (stretching right side) Sit to stand with RTB around knees 2 x 10 no UE assist Sidestepping with RTB around knees in // bars down and back x 5  Seated hamstring stretch with strap 10 x 10"  Supine: IT band stretch with strap 10 x 20" AROM right knee -4 to 122 Prone hang 2# x 2'   01/26/24 Nustep seat 8 x 5' dynamic warm up [place] SPM > 65  Standing: Heel raise on incline slope 10x 3" 2 sets Toe raises decline slope 10x  Knee drive on 59DG step height for flexion 5x 10" TKE 10x 5" with RTB Sidestep with RTB around thigh 3RT inside // with minimal to no HHA Hamstring stretch 3x 30" 12in step height Slant board 3x 30"  IT band stretch by 12in step 2x 30"   Gait training with focus on heel strike, knee flexion with toe push off x  200 ft no AD  Prone: 2 min prone knee hang  01/21/24 Nustep seat 8 x 5' dynamic warm up Standing: Heel raises 2 x 10 Slant board 5 x 20" Standing side bend stretch 10" x 10 in // bars Hamstring stretch on 6" step 5 x 20" Seated: Hamstring stretch with strap 10 x 10" Left sidelying right IT band stretch 5 x 20" (Ober's position) Supine IT band stretch with strap 10 x 10" Supine knee extension stretch with 2# weight and heel elevated    01/13/24 Review of HEP and goals Supine: Bridge with red theraband 2 x 10 with 3" hold Sidelying clam RTB 2 x 10 Hamstring stretch 3 x 20" Hamstring stretch with strap 10 x 20" Nustep seat 9 x 5' Slant board 5 x 20" Standing: Sidebend stretch/IT band stretch 10" x 10 6" box hamstring stretch 5 x 20" Hip extension 2 x 10 Updated HEP     01/04/2024 PT Evaluation and HEP   PATIENT EDUCATION:  Education details: PT Evaluation, findings, prognosis, frequency, attendance policy, and HEP below. Person educated: Patient Education method: Explanation and Demonstration Education comprehension: verbalized understanding  HOME EXERCISE PROGRAM: 01/21/24  - Supine ITB Stretch with Strap  - 1 x daily - 7 x weekly - 1 sets - 10 reps - 10 sec hold - Sidelying ITB Stretch off Table  - 1 x daily - 7 x weekly - 1 sets - 5 reps - 20 sec hold 01/13/24 - TL Sidebending Stretch - Single Arm Overhead  - 2 x daily - 7 x weekly - 1 sets - 10 reps - 10 sec hold - Hooklying Hamstring Stretch with Strap  - 2 x daily - 7 x weekly - 1 sets - 10 reps - 20 sec hold - Supine Hamstring Stretch  - 2 x daily - 7 x weekly - 1 sets - 10 reps - 20 sec hold  Access Code: [email or web address] URL: [email or web address] Date: 01/04/2024 Prepared by: Starling Manns  Exercises - Supine Bridge with Resistance Band  - 1 x daily - 7 x weekly - 3 sets - 12-15 reps - 3 hold - Clamshell with Resistance  - 1 x daily - 7 x weekly - 3 sets - 12-15 reps - 3 hold - Standing Ankle  Dorsiflexion Stretch  - 1 x daily - 7 x weekly - 3 sets - 10 reps - 30 hold  ASSESSMENT:  CLINICAL IMPRESSION: Today's session with focus on right knee mobility and strength.  She is able to walk around some in the gym without her AD today.  Put RTB around the knees with sit to stand to encourage keeping hips and knees from collapsing with squats (prevent hip internal rotation and knee valgus).  Improving knee mobility noted. Encouraged patient to continue to work on prone hang at home. Increased knee extension strength noted today.    Patient will benefit from continued skilled therapy services to address deficits and promote return to optimal function.        Eval:Patient is a 83 y.o. female who was seen today for physical therapy evaluation and treatment for M76.31 (ICD-10-CM) - Iliotibial band syndrome affecting right lower leg.   Pt with previous R TKR within the year and present with R knee flexion, limited to lacking 6 degrees from 0. Pt demonstrating limitations in functional mobility, transfers,  safety concerns due to muscle weakness, ROM deficits, and RLE pain. Pt will benefit from skilled Physical Therapy services to address deficits/limitations in order to improve functional and QOL.    OBJECTIVE IMPAIRMENTS: Abnormal gait, decreased activity tolerance, decreased balance, decreased mobility, difficulty walking, decreased ROM, decreased strength, postural dysfunction, and pain.   ACTIVITY LIMITATIONS: carrying, lifting, bending, standing, transfers, and locomotion level  PARTICIPATION LIMITATIONS: shopping, community activity, occupation, and yard work  PERSONAL FACTORS: Age, Behavior pattern, and Time since onset of injury/illness/exacerbation are also affecting patient's functional outcome.   REHAB POTENTIAL: Good  CLINICAL DECISION MAKING: Stable/uncomplicated  EVALUATION COMPLEXITY: Low   GOALS: Goals reviewed with patient? No  SHORT TERM GOALS: Target date:  01/25/2024  Pt will be independent with HEP in order to demonstrate participation in Physical Therapy POC.  Baseline: Goal status: IN PROGRESS  2.  Pt will 6/10 pain during mobility in order to demonstrate improved pain while performing ADLs.  Baseline:  Goal status: IN PROGRESS  LONG TERM GOALS: Target date: 02/15/2024  Pt will improve 30 Second Chair Stand Test by 2 in order to demonstrate improved functional strength to return to desired activities.  Baseline: See objective. 7x at eval Goal status: IN PROGRESS   2.  Pt will improve LEFS score by 8 points in order to demonstrate improved pain with functional goals and outcomes. Baseline: See objective.  Goal status: IN PROGRESS  3.  Pt will improve R knee ROM  by 0-120 in order to improve mechanics and efficiency during functional activities.. Baseline: See objective.  Goal status: IN PROGRESS  4.  Pt will improve BLE MMT by at least 1/2 grade in order to improve safety concerns and reduce fall risks during functional activities.. Baseline: See objective.  Goal status: IN PROGRESS   PLAN:  PT FREQUENCY: 2x/week  PT DURATION: 6 weeks  PLANNED INTERVENTIONS: 97164- PT Re-evaluation, 97110-Therapeutic exercises, 97530- Therapeutic activity, [id number]- Neuromuscular re-education, 97535- Self Care, 40981- Manual therapy, [id number]- Gait training, [phone]- Electrical stimulation (unattended), [phone]- Electrical stimulation (manual), [id number]- Traction (mechanical), Balance training, Stair training, Taping, Dry Needling, Joint mobilization, Joint manipulation, Spinal manipulation, Spinal mobilization, Cryotherapy, and Moist heat  PLAN FOR NEXT SESSION: R knee ROM, gluteal strengthening, and focus on gaining lacking knee extension. Update HEP  1:42 PM, 01/29/24 Brailyn Delman Small Adiah Guereca MPT [facility] [place] [phone]

## 2024-02-02 ENCOUNTER — Ambulatory Visit (HOSPITAL_COMMUNITY)

## 2024-02-02 ENCOUNTER — Encounter (HOSPITAL_COMMUNITY): Payer: Self-pay

## 2024-02-02 DIAGNOSIS — M6281 Muscle weakness (generalized): Secondary | ICD-10-CM | POA: Diagnosis not present

## 2024-02-02 DIAGNOSIS — M25661 Stiffness of right knee, not elsewhere classified: Secondary | ICD-10-CM | POA: Diagnosis not present

## 2024-02-02 DIAGNOSIS — Z96651 Presence of right artificial knee joint: Secondary | ICD-10-CM

## 2024-02-02 DIAGNOSIS — M7631 Iliotibial band syndrome, right leg: Secondary | ICD-10-CM

## 2024-02-02 DIAGNOSIS — R262 Difficulty in walking, not elsewhere classified: Secondary | ICD-10-CM | POA: Diagnosis not present

## 2024-02-02 DIAGNOSIS — G8929 Other chronic pain: Secondary | ICD-10-CM

## 2024-02-02 DIAGNOSIS — M25561 Pain in right knee: Secondary | ICD-10-CM | POA: Diagnosis not present

## 2024-02-02 NOTE — Therapy (Signed)
 OUTPATIENT PHYSICAL THERAPY LOWER EXTREMITY TREATMENT   Patient Name: Wendy Newman MRN: [id number] DOB:1941/06/29, 83 y.o., female Today's Date: 02/02/2024  END OF SESSION:  PT End of Session - 02/02/24 1025     Visit Number 6    Number of Visits 12    Date for PT Re-Evaluation 02/15/24    Authorization Type [employer] Dual Complete    Authorization Time Period no auth; no limit    Progress Note Due on Visit 10    PT Start Time 1020    PT Stop Time 1058    PT Time Calculation (min) 38 min    Activity Tolerance Patient tolerated treatment well    Behavior During Therapy WFL for tasks assessed/performed              Past Medical History:  Diagnosis Date   Anxiety    Cataract    DVT (deep venous thrombosis) (HCC)    Edema of both legs    Essential hypertension    Osteoporosis    PONV (postoperative nausea and vomiting)    Vitamin D deficiency    Past Surgical History:  Procedure Laterality Date   ABDOMINAL HYSTERECTOMY     Complete   CATARACT EXTRACTION Bilateral    CESAREAN SECTION     X 3   COLONOSCOPY  10/10/2008   WGN:[id number]   COLONOSCOPY WITH PROPOFOL N/A 12/21/2014   Dr. Rourk:noncompliant left colon and redundant right colon, rectal and colonic mucosa appeared normal.    TOTAL KNEE ARTHROPLASTY Right 06/16/2023   Procedure: TOTAL KNEE ARTHROPLASTY;  Surgeon: Vickki Hearing, MD;  Location: [facility];  Service: Orthopedics;  Laterality: Right;   Patient Active Problem List   Diagnosis Date Noted   Nausea & vomiting 06/17/2023   Osteoarthritis of right knee 06/16/2023   Acute deep vein thrombosis (DVT) of popliteal vein of left lower extremity (HCC) 10/02/2021   Allergic rhinoconjunctivitis 07/02/2017   Family history of colonic polyps 11/28/2014   Family history of colon cancer 11/28/2014   Constipation 11/28/2014   Vitamin D deficiency 06/16/2013   Hypertension    Anxiety    Osteoporosis    Cataract    Hyperlipemia 05/18/2009   Anxiety state  05/18/2009   NECK PAIN 05/18/2009   ALLERGIC RHINITIS 08/28/2008   OSTEOARTHRITIS 08/28/2008   POSTMENOPAUSAL OSTEOPOROSIS 08/28/2008   PAT 04/24/2008   OTHER ABNORMAL BLOOD CHEMISTRY 04/24/2008   Essential hypertension 12/16/2007   HEADACHE 12/16/2007    PCP: Donita Brooks, MD  REFERRING PROVIDER: Vickki Hearing, MD  REFERRING DIAG: M76.31 (ICD-10-CM) - Iliotibial band syndrome affecting right lower leg   THERAPY DIAG:  Iliotibial band syndrome affecting right lower leg  Decreased range of motion (ROM) of right knee  Muscle weakness (generalized)  Difficulty in walking, not elsewhere classified  Status post total right knee replacement  Chronic pain of right knee  Rationale for Evaluation and Treatment: Rehabilitation  ONSET DATE: 5 months ago  SUBJECTIVE:   SUBJECTIVE STATEMENT: No reports of pain in Rt knee, just feels tight.  Has been walking around the house without AD.    PERTINENT HISTORY: R TKA 8/24.  PAIN:  Are you having pain? Yes: NPRS scale: 0/10 Pain location: R lateral knee Pain description: sharp Aggravating factors: Transitions Relieving factors: walking around  PRECAUTIONS: None  RED FLAGS: None   WEIGHT BEARING RESTRICTIONS: No  FALLS:  Has patient fallen in last 6 months? No   PATIENT GOALS: "get out of pain"  NEXT  MD VISIT: August   OBJECTIVE:  Note: Objective measures were completed at Evaluation unless otherwise noted.  DIAGNOSTIC FINDINGS:   PATIENT SURVEYS:  LEFS 64/80 = 80%   COGNITION: Overall cognitive status: Within functional limits for tasks assessed     SENSATION: WFL   POSTURE: rounded shoulders and forward head  PALPATION: Tender to palpate on gurdys tubercle and traveling proximal along R lateral right  LOWER EXTREMITY ROM:  Active ROM Right eval Left eval Right 01/29/24  Hip flexion     Hip extension     Hip abduction     Hip adduction     Hip internal rotation     Hip external  rotation     Knee flexion 115 140 122  Knee extension -6 0 -4  Ankle dorsiflexion 0 2   Ankle plantarflexion     Ankle inversion     Ankle eversion      (Blank rows = not tested)  LOWER EXTREMITY MMT:  MMT Right eval Left eval Right 01/29/24  Hip flexion 3+ 4-   Hip extension 3+ 3+   Hip abduction 3+ 3+   Hip adduction     Hip internal rotation     Hip external rotation     Knee flexion 3- 3-   Knee extension 4- 4- 4+  Ankle dorsiflexion     Ankle plantarflexion     Ankle inversion     Ankle eversion      (Blank rows = not tested)   FUNCTIONAL TESTS:  30 seconds chair stand test: 7x   GAIT: Distance walked: 109ft Assistive device utilized: Single point cane Level of assistance: Modified independence Comments: Antalgia through RLE with bilateral knee valgus and constant R knee flexion through stance and swing phase.                                                                                                                                 TREATMENT DATE:  02/02/24: Bike seat 7 x5' Heel raises 20x Stairs 7in reciprocal pattern 2RT [employer]craft TKE 2Pl 10x TKE with towel behind knee 10x 5" Slant baord 3x 30" 12in hamstring stretch 3x 30" Squat with RTB around thigh front of chair 2x 10 Sidestep with RTB around thigh 2RT 384ft no AD, cueing for heel strike and equal stride length.  01/29/24 Nustep seat 8 x 5' dynamic warm up   Heel raises on incline 2 x 10 Slant board 5 x 20" Decline toe raises 2 x 10 Standing side bend stretch 10 x 10" (stretching right side) Sit to stand with RTB around knees 2 x 10 no UE assist Sidestepping with RTB around knees in // bars down and back x 5  Seated hamstring stretch with strap 10 x 10"  Supine: IT band stretch with strap 10 x 20" AROM right knee -4 to 122 Prone hang 2# x 2'   01/26/24 Nustep seat 8 x 5'  dynamic warm up [place] SPM > 65  Standing: Heel raise on incline slope 10x 3" 2 sets Toe raises  decline slope 10x  Knee drive on 13YQ step height for flexion 5x 10" TKE 10x 5" with RTB Sidestep with RTB around thigh 3RT inside // with minimal to no HHA Hamstring stretch 3x 30" 12in step height Slant board 3x 30" IT band stretch by 12in step 2x 30"   Gait training with focus on heel strike, knee flexion with toe push off x 200 ft no AD  Prone: 2 min prone knee hang  01/21/24 Nustep seat 8 x 5' dynamic warm up Standing: Heel raises 2 x 10 Slant board 5 x 20" Standing side bend stretch 10" x 10 in // bars Hamstring stretch on 6" step 5 x 20" Seated: Hamstring stretch with strap 10 x 10" Left sidelying right IT band stretch 5 x 20" (Ober's position) Supine IT band stretch with strap 10 x 10" Supine knee extension stretch with 2# weight and heel elevated    01/13/24 Review of HEP and goals Supine: Bridge with red theraband 2 x 10 with 3" hold Sidelying clam RTB 2 x 10 Hamstring stretch 3 x 20" Hamstring stretch with strap 10 x 20" Nustep seat 9 x 5' Slant board 5 x 20" Standing: Sidebend stretch/IT band stretch 10" x 10 6" box hamstring stretch 5 x 20" Hip extension 2 x 10 Updated HEP     01/04/2024 PT Evaluation and HEP   PATIENT EDUCATION:  Education details: PT Evaluation, findings, prognosis, frequency, attendance policy, and HEP below. Person educated: Patient Education method: Explanation and Demonstration Education comprehension: verbalized understanding  HOME EXERCISE PROGRAM: 02/02/24: - Prone Knee Extension Hang  - 2 x daily - 7 x weekly - 1 sets - 3 reps - 1'+ hold - Standing Terminal Knee Extension at Wall with Ball  - 2 x daily - 7 x weekly - 1 sets - 10 reps - 5" hold 01/21/24  - Supine ITB Stretch with Strap  - 1 x daily - 7 x weekly - 1 sets - 10 reps - 10 sec hold - Sidelying ITB Stretch off Table  - 1 x daily - 7 x weekly - 1 sets - 5 reps - 20 sec hold 01/13/24 - TL Sidebending Stretch - Single Arm Overhead  - 2 x daily - 7 x weekly - 1 sets  - 10 reps - 10 sec hold - Hooklying Hamstring Stretch with Strap  - 2 x daily - 7 x weekly - 1 sets - 10 reps - 20 sec hold - Supine Hamstring Stretch  - 2 x daily - 7 x weekly - 1 sets - 10 reps - 20 sec hold  Access Code: [email or web address] URL: [email or web address] Date: 01/04/2024 Prepared by: Starling Manns  Exercises - Supine Bridge with Resistance Band  - 1 x daily - 7 x weekly - 3 sets - 12-15 reps - 3 hold - Clamshell with Resistance  - 1 x daily - 7 x weekly - 3 sets - 12-15 reps - 3 hold - Standing Ankle Dorsiflexion Stretch  - 1 x daily - 7 x weekly - 3 sets - 10 reps - 30 hold  ASSESSMENT:  CLINICAL IMPRESSION: Session focus with knee mobility and functional strengthening.  complete without AD, able to complete 301 ft with no LOB episodes, did require cueing for heel to toe mechanics and equalized stride length.  Added exercises for TKE and prone knee hang  to HEP with printout given and verbalized understanding.  Pt required cueing for knee alignment with STS and squats to reduce IR and knee valgus, improved mechanics with additional theraband resistance for improved proprioception following cueing.  No reports of increased pain at EOS.    Eval:Patient is a 83 y.o. female who was seen today for physical therapy evaluation and treatment for M76.31 (ICD-10-CM) - Iliotibial band syndrome affecting right lower leg.   Pt with previous R TKR within the year and present with R knee flexion, limited to lacking 6 degrees from 0. Pt demonstrating limitations in functional mobility, transfers, safety concerns due to muscle weakness, ROM deficits, and RLE pain. Pt will benefit from skilled Physical Therapy services to address deficits/limitations in order to improve functional and QOL.    OBJECTIVE IMPAIRMENTS: Abnormal gait, decreased activity tolerance, decreased balance, decreased mobility, difficulty walking, decreased ROM, decreased strength, postural dysfunction, and pain.    ACTIVITY LIMITATIONS: carrying, lifting, bending, standing, transfers, and locomotion level  PARTICIPATION LIMITATIONS: shopping, community activity, occupation, and yard work  PERSONAL FACTORS: Age, Behavior pattern, and Time since onset of injury/illness/exacerbation are also affecting patient's functional outcome.   REHAB POTENTIAL: Good  CLINICAL DECISION MAKING: Stable/uncomplicated  EVALUATION COMPLEXITY: Low   GOALS: Goals reviewed with patient? No  SHORT TERM GOALS: Target date: 01/25/2024  Pt will be independent with HEP in order to demonstrate participation in Physical Therapy POC.  Baseline: Goal status: IN PROGRESS  2.  Pt will 6/10 pain during mobility in order to demonstrate improved pain while performing ADLs.  Baseline:  Goal status: IN PROGRESS  LONG TERM GOALS: Target date: 02/15/2024  Pt will improve 30 Second Chair Stand Test by 2 in order to demonstrate improved functional strength to return to desired activities.  Baseline: See objective. 7x at eval Goal status: IN PROGRESS   2.  Pt will improve LEFS score by 8 points in order to demonstrate improved pain with functional goals and outcomes. Baseline: See objective.  Goal status: IN PROGRESS  3.  Pt will improve R knee ROM  by 0-120 in order to improve mechanics and efficiency during functional activities.. Baseline: See objective.  Goal status: IN PROGRESS  4.  Pt will improve BLE MMT by at least 1/2 grade in order to improve safety concerns and reduce fall risks during functional activities.. Baseline: See objective.  Goal status: IN PROGRESS   PLAN:  PT FREQUENCY: 2x/week  PT DURATION: 6 weeks  PLANNED INTERVENTIONS: 97164- PT Re-evaluation, 97110-Therapeutic exercises, 97530- Therapeutic activity, [id number]- Neuromuscular re-education, 97535- Self Care, 16109- Manual therapy, [id number]- Gait training, [phone]- Electrical stimulation (unattended), [phone]- Electrical stimulation (manual), [id number]-  Traction (mechanical), Balance training, Stair training, Taping, Dry Needling, Joint mobilization, Joint manipulation, Spinal manipulation, Spinal mobilization, Cryotherapy, and Moist heat  PLAN FOR NEXT SESSION: R knee ROM, gluteal strengthening, and focus on gaining lacking knee extension. Update HEP   Becky Sax, LPTA/CLT; [facility]IS [phone]  5:08 PM, 02/02/24

## 2024-02-04 ENCOUNTER — Encounter (HOSPITAL_COMMUNITY)

## 2024-02-05 ENCOUNTER — Ambulatory Visit (HOSPITAL_COMMUNITY)

## 2024-02-05 ENCOUNTER — Encounter (HOSPITAL_COMMUNITY): Payer: Self-pay

## 2024-02-05 DIAGNOSIS — M25561 Pain in right knee: Secondary | ICD-10-CM | POA: Diagnosis not present

## 2024-02-05 DIAGNOSIS — M7631 Iliotibial band syndrome, right leg: Secondary | ICD-10-CM

## 2024-02-05 DIAGNOSIS — R262 Difficulty in walking, not elsewhere classified: Secondary | ICD-10-CM

## 2024-02-05 DIAGNOSIS — M25661 Stiffness of right knee, not elsewhere classified: Secondary | ICD-10-CM | POA: Diagnosis not present

## 2024-02-05 DIAGNOSIS — M6281 Muscle weakness (generalized): Secondary | ICD-10-CM

## 2024-02-05 DIAGNOSIS — Z96651 Presence of right artificial knee joint: Secondary | ICD-10-CM | POA: Diagnosis not present

## 2024-02-05 DIAGNOSIS — G8929 Other chronic pain: Secondary | ICD-10-CM | POA: Diagnosis not present

## 2024-02-05 NOTE — Therapy (Signed)
 OUTPATIENT PHYSICAL THERAPY LOWER EXTREMITY TREATMENT   Patient Name: Wendy Newman MRN: [id number] DOB:1941/03/16, 83 y.o., female Today's Date: 02/05/2024  END OF SESSION:  PT End of Session - 02/05/24 1258     Visit Number 7    Number of Visits 12    Date for PT Re-Evaluation 02/15/24    Authorization Type [employer] Dual Complete    Authorization Time Period no auth; no limit    Progress Note Due on Visit 10    PT Start Time 1300    PT Stop Time 1342    PT Time Calculation (min) 42 min    Activity Tolerance Patient tolerated treatment well    Behavior During Therapy WFL for tasks assessed/performed              Past Medical History:  Diagnosis Date   Anxiety    Cataract    DVT (deep venous thrombosis) (HCC)    Edema of both legs    Essential hypertension    Osteoporosis    PONV (postoperative nausea and vomiting)    Vitamin D deficiency    Past Surgical History:  Procedure Laterality Date   ABDOMINAL HYSTERECTOMY     Complete   CATARACT EXTRACTION Bilateral    CESAREAN SECTION     X 3   COLONOSCOPY  10/10/2008   [id number]   COLONOSCOPY WITH PROPOFOL N/A 12/21/2014   Dr. Rourk:noncompliant left colon and redundant right colon, rectal and colonic mucosa appeared normal.    TOTAL KNEE ARTHROPLASTY Right 06/16/2023   Procedure: TOTAL KNEE ARTHROPLASTY;  Surgeon: Vickki Hearing, MD;  Location: [facility];  Service: Orthopedics;  Laterality: Right;   Patient Active Problem List   Diagnosis Date Noted   Nausea & vomiting 06/17/2023   Osteoarthritis of right knee 06/16/2023   Acute deep vein thrombosis (DVT) of popliteal vein of left lower extremity (HCC) 10/02/2021   Allergic rhinoconjunctivitis 07/02/2017   Family history of colonic polyps 11/28/2014   Family history of colon cancer 11/28/2014   Constipation 11/28/2014   Vitamin D deficiency 06/16/2013   Hypertension    Anxiety    Osteoporosis    Cataract    Hyperlipemia 05/18/2009   Anxiety state  05/18/2009   NECK PAIN 05/18/2009   ALLERGIC RHINITIS 08/28/2008   OSTEOARTHRITIS 08/28/2008   POSTMENOPAUSAL OSTEOPOROSIS 08/28/2008   PAT 04/24/2008   OTHER ABNORMAL BLOOD CHEMISTRY 04/24/2008   Essential hypertension 12/16/2007   HEADACHE 12/16/2007    PCP: Donita Brooks, MD  REFERRING PROVIDER: Vickki Hearing, MD  REFERRING DIAG: M76.31 (ICD-10-CM) - Iliotibial band syndrome affecting right lower leg   THERAPY DIAG:  Iliotibial band syndrome affecting right lower leg  Decreased range of motion (ROM) of right knee  Muscle weakness (generalized)  Difficulty in walking, not elsewhere classified  Status post total right knee replacement  Rationale for Evaluation and Treatment: Rehabilitation  ONSET DATE: 5 months ago  SUBJECTIVE:   SUBJECTIVE STATEMENT: No reports of pain in Rt knee, just tightness.  Exercises are going well at home.  Has began the prone knee hang, able to tolerate 2' last night.  Pt excited for this weekend, will be the first time seeing her grandson in 7 years.    PERTINENT HISTORY: R TKA 8/24.  PAIN:  Are you having pain? Yes: NPRS scale: 0/10 Pain location: R lateral knee Pain description: sharp Aggravating factors: Transitions Relieving factors: walking around  PRECAUTIONS: None  RED FLAGS: None   WEIGHT BEARING RESTRICTIONS: No  FALLS:  Has patient fallen in last 6 months? No   PATIENT GOALS: "get out of pain"  NEXT MD VISIT: August   OBJECTIVE:  Note: Objective measures were completed at Evaluation unless otherwise noted.  DIAGNOSTIC FINDINGS:   PATIENT SURVEYS:  LEFS 64/80 = 80%   COGNITION: Overall cognitive status: Within functional limits for tasks assessed     SENSATION: WFL   POSTURE: rounded shoulders and forward head  PALPATION: Tender to palpate on gurdys tubercle and traveling proximal along R lateral right  LOWER EXTREMITY ROM:  Active ROM Right eval Left eval Right 01/29/24  Hip flexion      Hip extension     Hip abduction     Hip adduction     Hip internal rotation     Hip external rotation     Knee flexion 115 140 122  Knee extension -6 0 -4  Ankle dorsiflexion 0 2   Ankle plantarflexion     Ankle inversion     Ankle eversion      (Blank rows = not tested)  LOWER EXTREMITY MMT:  MMT Right eval Left eval Right 01/29/24  Hip flexion 3+ 4-   Hip extension 3+ 3+   Hip abduction 3+ 3+   Hip adduction     Hip internal rotation     Hip external rotation     Knee flexion 3- 3-   Knee extension 4- 4- 4+  Ankle dorsiflexion     Ankle plantarflexion     Ankle inversion     Ankle eversion      (Blank rows = not tested)   FUNCTIONAL TESTS:  30 seconds chair stand test: 7x   GAIT: Distance walked: 62ft Assistive device utilized: Single point cane Level of assistance: Modified independence Comments: Antalgia through RLE with bilateral knee valgus and constant R knee flexion through stance and swing phase.                                                                                                                                 TREATMENT DATE:  02/05/24: Bike seat 7 x5' 371ft no AD Squat front of chair 10x 3" Forward lunge onto 6in step with 1 HHA 15x Stairs 7in reciprocal pattern 2RT Lateral step up 4in 15x 1 HR Step down 4in 15x 1 HR [employer]craft walkout 3Pl retro 2Pl sidestep 5RT each SLS 30" with 1 HHA Tandem stance 2x 30"  02/02/24: Bike seat 7 x5' Heel raises 20x Stairs 7in reciprocal pattern 2RT [employer] TKE 2Pl 10x TKE with towel behind knee 10x 5" Slant baord 3x 30" 12in hamstring stretch 3x 30" Squat with RTB around thigh front of chair 2x 10 Sidestep with RTB around thigh 2RT 384ft no AD, cueing for heel strike and equal stride length.  01/29/24 Nustep seat 8 x 5' dynamic warm up   Heel raises on incline 2 x 10 Slant board 5 x 20"  Decline toe raises 2 x 10 Standing side bend stretch 10 x 10" (stretching right side) Sit to  stand with RTB around knees 2 x 10 no UE assist Sidestepping with RTB around knees in // bars down and back x 5  Seated hamstring stretch with strap 10 x 10"  Supine: IT band stretch with strap 10 x 20" AROM right knee -4 to 122 Prone hang 2# x 2'   01/26/24 Nustep seat 8 x 5' dynamic warm up [place] SPM > 65  Standing: Heel raise on incline slope 10x 3" 2 sets Toe raises decline slope 10x  Knee drive on 21HY step height for flexion 5x 10" TKE 10x 5" with RTB Sidestep with RTB around thigh 3RT inside // with minimal to no HHA Hamstring stretch 3x 30" 12in step height Slant board 3x 30" IT band stretch by 12in step 2x 30"   Gait training with focus on heel strike, knee flexion with toe push off x 200 ft no AD  Prone: 2 min prone knee hang  01/21/24 Nustep seat 8 x 5' dynamic warm up Standing: Heel raises 2 x 10 Slant board 5 x 20" Standing side bend stretch 10" x 10 in // bars Hamstring stretch on 6" step 5 x 20" Seated: Hamstring stretch with strap 10 x 10" Left sidelying right IT band stretch 5 x 20" (Ober's position) Supine IT band stretch with strap 10 x 10" Supine knee extension stretch with 2# weight and heel elevated    01/13/24 Review of HEP and goals Supine: Bridge with red theraband 2 x 10 with 3" hold Sidelying clam RTB 2 x 10 Hamstring stretch 3 x 20" Hamstring stretch with strap 10 x 20" Nustep seat 9 x 5' Slant board 5 x 20" Standing: Sidebend stretch/IT band stretch 10" x 10 6" box hamstring stretch 5 x 20" Hip extension 2 x 10 Updated HEP     01/04/2024 PT Evaluation and HEP   PATIENT EDUCATION:  Education details: PT Evaluation, findings, prognosis, frequency, attendance policy, and HEP below. Person educated: Patient Education method: Explanation and Demonstration Education comprehension: verbalized understanding  HOME EXERCISE PROGRAM: 02/02/24: - Prone Knee Extension Hang  - 2 x daily - 7 x weekly - 1 sets - 3 reps - 1'+  hold - Standing Terminal Knee Extension at Wall with Ball  - 2 x daily - 7 x weekly - 1 sets - 10 reps - 5" hold 01/21/24  - Supine ITB Stretch with Strap  - 1 x daily - 7 x weekly - 1 sets - 10 reps - 10 sec hold - Sidelying ITB Stretch off Table  - 1 x daily - 7 x weekly - 1 sets - 5 reps - 20 sec hold 01/13/24 - TL Sidebending Stretch - Single Arm Overhead  - 2 x daily - 7 x weekly - 1 sets - 10 reps - 10 sec hold - Hooklying Hamstring Stretch with Strap  - 2 x daily - 7 x weekly - 1 sets - 10 reps - 20 sec hold - Supine Hamstring Stretch  - 2 x daily - 7 x weekly - 1 sets - 10 reps - 20 sec hold  Access Code: [email or web address] URL: [email or web address] Date: 01/04/2024 Prepared by: Starling Manns  Exercises - Supine Bridge with Resistance Band  - 1 x daily - 7 x weekly - 3 sets - 12-15 reps - 3 hold - Clamshell with Resistance  - 1 x daily - 7 x weekly -  3 sets - 12-15 reps - 3 hold - Standing Ankle Dorsiflexion Stretch  - 1 x daily - 7 x weekly - 3 sets - 10 reps - 30 hold  ASSESSMENT:  CLINICAL IMPRESSION: Continued session focus with knee mobility and functional strengtheing.  Noted decreased eccentric control descending stairs, added step up training with good mechanics.  Pt continues to require cueing for eccentric control with majority of exercises due to weakness.  Pt tolerated well to session with no reports of increased pain.  Pt with extreme difficulty with additional balance activities, continue to address in future session.    Eval:Patient is a 83 y.o. female who was seen today for physical therapy evaluation and treatment for M76.31 (ICD-10-CM) - Iliotibial band syndrome affecting right lower leg.   Pt with previous R TKR within the year and present with R knee flexion, limited to lacking 6 degrees from 0. Pt demonstrating limitations in functional mobility, transfers, safety concerns due to muscle weakness, ROM deficits, and RLE pain. Pt will benefit from skilled  Physical Therapy services to address deficits/limitations in order to improve functional and QOL.    OBJECTIVE IMPAIRMENTS: Abnormal gait, decreased activity tolerance, decreased balance, decreased mobility, difficulty walking, decreased ROM, decreased strength, postural dysfunction, and pain.   ACTIVITY LIMITATIONS: carrying, lifting, bending, standing, transfers, and locomotion level  PARTICIPATION LIMITATIONS: shopping, community activity, occupation, and yard work  PERSONAL FACTORS: Age, Behavior pattern, and Time since onset of injury/illness/exacerbation are also affecting patient's functional outcome.   REHAB POTENTIAL: Good  CLINICAL DECISION MAKING: Stable/uncomplicated  EVALUATION COMPLEXITY: Low   GOALS: Goals reviewed with patient? No  SHORT TERM GOALS: Target date: 01/25/2024  Pt will be independent with HEP in order to demonstrate participation in Physical Therapy POC.  Baseline: Goal status: IN PROGRESS  2.  Pt will 6/10 pain during mobility in order to demonstrate improved pain while performing ADLs.  Baseline:  Goal status: IN PROGRESS  LONG TERM GOALS: Target date: 02/15/2024  Pt will improve 30 Second Chair Stand Test by 2 in order to demonstrate improved functional strength to return to desired activities.  Baseline: See objective. 7x at eval Goal status: IN PROGRESS   2.  Pt will improve LEFS score by 8 points in order to demonstrate improved pain with functional goals and outcomes. Baseline: See objective.  Goal status: IN PROGRESS  3.  Pt will improve R knee ROM  by 0-120 in order to improve mechanics and efficiency during functional activities.. Baseline: See objective.  Goal status: IN PROGRESS  4.  Pt will improve BLE MMT by at least 1/2 grade in order to improve safety concerns and reduce fall risks during functional activities.. Baseline: See objective.  Goal status: IN PROGRESS   PLAN:  PT FREQUENCY: 2x/week  PT DURATION: 6  weeks  PLANNED INTERVENTIONS: 97164- PT Re-evaluation, 97110-Therapeutic exercises, 97530- Therapeutic activity, [id number]- Neuromuscular re-education, 97535- Self Care, 78295- Manual therapy, [id number]- Gait training, [phone]- Electrical stimulation (unattended), [phone]- Electrical stimulation (manual), [id number]- Traction (mechanical), Balance training, Stair training, Taping, Dry Needling, Joint mobilization, Joint manipulation, Spinal manipulation, Spinal mobilization, Cryotherapy, and Moist heat  PLAN FOR NEXT SESSION: Rt knee ROM for extension, functional strengthening to improve eccentric control and increase focus with balance activities.   Becky Sax, LPTA/CLT; [place] [phone]  3:39 PM, 02/05/24

## 2024-02-09 ENCOUNTER — Encounter (HOSPITAL_COMMUNITY): Payer: Self-pay

## 2024-02-09 ENCOUNTER — Ambulatory Visit (HOSPITAL_COMMUNITY)

## 2024-02-09 DIAGNOSIS — M7631 Iliotibial band syndrome, right leg: Secondary | ICD-10-CM

## 2024-02-09 DIAGNOSIS — Z96651 Presence of right artificial knee joint: Secondary | ICD-10-CM | POA: Diagnosis not present

## 2024-02-09 DIAGNOSIS — M25561 Pain in right knee: Secondary | ICD-10-CM | POA: Diagnosis not present

## 2024-02-09 DIAGNOSIS — G8929 Other chronic pain: Secondary | ICD-10-CM | POA: Diagnosis not present

## 2024-02-09 DIAGNOSIS — M25661 Stiffness of right knee, not elsewhere classified: Secondary | ICD-10-CM

## 2024-02-09 DIAGNOSIS — M6281 Muscle weakness (generalized): Secondary | ICD-10-CM | POA: Diagnosis not present

## 2024-02-09 DIAGNOSIS — R262 Difficulty in walking, not elsewhere classified: Secondary | ICD-10-CM

## 2024-02-09 NOTE — Therapy (Signed)
 OUTPATIENT PHYSICAL THERAPY LOWER EXTREMITY TREATMENT   Patient Name: Wendy Newman MRN: [id number] DOB:April 09, 1941, 83 y.o., female Today's Date: 02/09/2024  END OF SESSION:  PT End of Session - 02/09/24 1118     Visit Number 8    Number of Visits 12    Date for PT Re-Evaluation 02/15/24    Authorization Type [employer] Dual Complete    Authorization Time Period no auth; no limit    Progress Note Due on Visit 10    PT Start Time 1105    PT Stop Time 1146    PT Time Calculation (min) 41 min    Activity Tolerance Patient tolerated treatment well    Behavior During Therapy WFL for tasks assessed/performed               Past Medical History:  Diagnosis Date   Anxiety    Cataract    DVT (deep venous thrombosis) (HCC)    Edema of both legs    Essential hypertension    Osteoporosis    PONV (postoperative nausea and vomiting)    Vitamin D deficiency    Past Surgical History:  Procedure Laterality Date   ABDOMINAL HYSTERECTOMY     Complete   CATARACT EXTRACTION Bilateral    CESAREAN SECTION     X 3   COLONOSCOPY  10/10/2008   OZD:[id number]   COLONOSCOPY WITH PROPOFOL N/A 12/21/2014   Dr. Rourk:noncompliant left colon and redundant right colon, rectal and colonic mucosa appeared normal.    TOTAL KNEE ARTHROPLASTY Right 06/16/2023   Procedure: TOTAL KNEE ARTHROPLASTY;  Surgeon: Darrin Emerald, MD;  Location: [facility];  Service: Orthopedics;  Laterality: Right;   Patient Active Problem List   Diagnosis Date Noted   Nausea & vomiting 06/17/2023   Osteoarthritis of right knee 06/16/2023   Acute deep vein thrombosis (DVT) of popliteal vein of left lower extremity (HCC) 10/02/2021   Allergic rhinoconjunctivitis 07/02/2017   Family history of colonic polyps 11/28/2014   Family history of colon cancer 11/28/2014   Constipation 11/28/2014   Vitamin D deficiency 06/16/2013   Hypertension    Anxiety    Osteoporosis    Cataract    Hyperlipemia 05/18/2009   Anxiety state  05/18/2009   NECK PAIN 05/18/2009   ALLERGIC RHINITIS 08/28/2008   OSTEOARTHRITIS 08/28/2008   POSTMENOPAUSAL OSTEOPOROSIS 08/28/2008   PAT 04/24/2008   OTHER ABNORMAL BLOOD CHEMISTRY 04/24/2008   Essential hypertension 12/16/2007   HEADACHE 12/16/2007    PCP: Austine Lefort, MD  REFERRING PROVIDER: Darrin Emerald, MD  REFERRING DIAG: M76.31 (ICD-10-CM) - Iliotibial band syndrome affecting right lower leg   THERAPY DIAG:  Iliotibial band syndrome affecting right lower leg  Decreased range of motion (ROM) of right knee  Muscle weakness (generalized)  Difficulty in walking, not elsewhere classified  Status post total right knee replacement  Chronic pain of right knee  Rationale for Evaluation and Treatment: Rehabilitation  ONSET DATE: 5 months ago  SUBJECTIVE:   SUBJECTIVE STATEMENT: No reports of pain in Rt knee, just tight/stiffness.  Reports compliance with HEP, able to tolerated 2 minutes with prone knee hang at home.  PERTINENT HISTORY: R TKA 8/24.  PAIN:  Are you having pain? Yes: NPRS scale: 0/10 Pain location: R lateral knee Pain description: sharp Aggravating factors: Transitions Relieving factors: walking around  PRECAUTIONS: None  RED FLAGS: None   WEIGHT BEARING RESTRICTIONS: No  FALLS:  Has patient fallen in last 6 months? No   PATIENT GOALS: "get  out of pain"  NEXT MD VISIT: August   OBJECTIVE:  Note: Objective measures were completed at Evaluation unless otherwise noted.  DIAGNOSTIC FINDINGS:   PATIENT SURVEYS:  LEFS 64/80 = 80%   COGNITION: Overall cognitive status: Within functional limits for tasks assessed     SENSATION: WFL   POSTURE: rounded shoulders and forward head  PALPATION: Tender to palpate on gurdys tubercle and traveling proximal along R lateral right  LOWER EXTREMITY ROM:  Active ROM Right eval Left eval Right 01/29/24  Hip flexion     Hip extension     Hip abduction     Hip adduction      Hip internal rotation     Hip external rotation     Knee flexion 115 140 122  Knee extension -6 0 -4  Ankle dorsiflexion 0 2   Ankle plantarflexion     Ankle inversion     Ankle eversion      (Blank rows = not tested)  LOWER EXTREMITY MMT:  MMT Right eval Left eval Right 01/29/24  Hip flexion 3+ 4-   Hip extension 3+ 3+   Hip abduction 3+ 3+   Hip adduction     Hip internal rotation     Hip external rotation     Knee flexion 3- 3-   Knee extension 4- 4- 4+  Ankle dorsiflexion     Ankle plantarflexion     Ankle inversion     Ankle eversion      (Blank rows = not tested)   FUNCTIONAL TESTS:  30 seconds chair stand test: 7x   GAIT: Distance walked: 8ft Assistive device utilized: Single point cane Level of assistance: Modified independence Comments: Antalgia through RLE with bilateral knee valgus and constant R knee flexion through stance and swing phase.                                                                                                                                 TREATMENT DATE:  02/09/24: Bike seat 7 x5' Holding yellow weighted ball STS then heel raise with ball overhead 10x 3" eccenctric control Squat front of chair 10x 3" 6in power up with 1 HR A 10x 5" Lateral step up 4in step height 10x Step down 4in 10x 1HR Slant board 3x 30" Hamstring stretch 3x 30" on 12 in  Bodycraft walkout 3Pl retro 2Pl sidestep 5RT each SLS Vector stance 3x 5"  02/05/24: Bike seat 7 x5' 375ft no AD Squat front of chair 10x 3" Forward lunge onto 6in step with 1 HHA 15x Stairs 7in reciprocal pattern 2RT Lateral step up 4in 15x 1 HR Step down 4in 15x 1 HR Bodycraft walkout 3Pl retro 2Pl sidestep 5RT each SLS 30" with 1 HHA Tandem stance 2x 30"  02/02/24: Bike seat 7 x5' Heel raises 20x Stairs 7in reciprocal pattern 2RT Bodycraft TKE 2Pl 10x TKE with towel behind knee 10x 5" Slant baord 3x  30" 12in hamstring stretch 3x 30" Squat with RTB around thigh  front of chair 2x 10 Sidestep with RTB around thigh 2RT 348ft no AD, cueing for heel strike and equal stride length.  01/29/24 Nustep seat 8 x 5' dynamic warm up   Heel raises on incline 2 x 10 Slant board 5 x 20" Decline toe raises 2 x 10 Standing side bend stretch 10 x 10" (stretching right side) Sit to stand with RTB around knees 2 x 10 no UE assist Sidestepping with RTB around knees in // bars down and back x 5  Seated hamstring stretch with strap 10 x 10"  Supine: IT band stretch with strap 10 x 20" AROM right knee -4 to 122 Prone hang 2# x 2'   01/26/24 Nustep seat 8 x 5' dynamic warm up [place] SPM > 65  Standing: Heel raise on incline slope 10x 3" 2 sets Toe raises decline slope 10x  Knee drive on 16XW step height for flexion 5x 10" TKE 10x 5" with RTB Sidestep with RTB around thigh 3RT inside // with minimal to no HHA Hamstring stretch 3x 30" 12in step height Slant board 3x 30" IT band stretch by 12in step 2x 30"   Gait training with focus on heel strike, knee flexion with toe push off x 200 ft no AD  Prone: 2 min prone knee hang  01/21/24 Nustep seat 8 x 5' dynamic warm up Standing: Heel raises 2 x 10 Slant board 5 x 20" Standing side bend stretch 10" x 10 in // bars Hamstring stretch on 6" step 5 x 20" Seated: Hamstring stretch with strap 10 x 10" Left sidelying right IT band stretch 5 x 20" (Ober's position) Supine IT band stretch with strap 10 x 10" Supine knee extension stretch with 2# weight and heel elevated    01/13/24 Review of HEP and goals Supine: Bridge with red theraband 2 x 10 with 3" hold Sidelying clam RTB 2 x 10 Hamstring stretch 3 x 20" Hamstring stretch with strap 10 x 20" Nustep seat 9 x 5' Slant board 5 x 20" Standing: Sidebend stretch/IT band stretch 10" x 10 6" box hamstring stretch 5 x 20" Hip extension 2 x 10 Updated HEP     01/04/2024 PT Evaluation and HEP   PATIENT EDUCATION:  Education details: PT  Evaluation, findings, prognosis, frequency, attendance policy, and HEP below. Person educated: Patient Education method: Explanation and Demonstration Education comprehension: verbalized understanding  HOME EXERCISE PROGRAM: 02/09/24: - Standing Tandem Balance with Counter Support  - 2 x daily - 7 x weekly - 1 sets - 3 reps - 30" hold - Standing 3-Way Kick  - 1 x daily - 7 x weekly - 3 sets - 10 reps - Single Leg Stance  - 2 x daily - 7 x weekly - 1 sets - 3 reps - 30" hold 02/02/24: - Prone Knee Extension Hang  - 2 x daily - 7 x weekly - 1 sets - 3 reps - 1'+ hold - Standing Terminal Knee Extension at Wall with Ball  - 2 x daily - 7 x weekly - 1 sets - 10 reps - 5" hold 01/21/24  - Supine ITB Stretch with Strap  - 1 x daily - 7 x weekly - 1 sets - 10 reps - 10 sec hold - Sidelying ITB Stretch off Table  - 1 x daily - 7 x weekly - 1 sets - 5 reps - 20 sec hold 01/13/24 -  TL Sidebending Stretch - Single Arm Overhead  - 2 x daily - 7 x weekly - 1 sets - 10 reps - 10 sec hold - Hooklying Hamstring Stretch with Strap  - 2 x daily - 7 x weekly - 1 sets - 10 reps - 20 sec hold - Supine Hamstring Stretch  - 2 x daily - 7 x weekly - 1 sets - 10 reps - 20 sec hold  Access Code: [email or web address] URL: [email or web address] Date: 01/04/2024 Prepared by: Irene Mannheim  Exercises - Supine Bridge with Resistance Band  - 1 x daily - 7 x weekly - 3 sets - 12-15 reps - 3 hold - Clamshell with Resistance  - 1 x daily - 7 x weekly - 3 sets - 12-15 reps - 3 hold - Standing Ankle Dorsiflexion Stretch  - 1 x daily - 7 x weekly - 3 sets - 10 reps - 30 hold  ASSESSMENT:  CLINICAL IMPRESSION: Continued session focus with knee mobility and balance activities incorporated with functional strengthening activities.  Pt continues to have balance impairments requiring HHA or stepping outside BOS.  Given copy of balance activities added to HEP, encouraged to complete near sink/counter for safety.  Continued  functional strengthening exercises with noted eccentric control weakness, cueing for controlled movements.   Eval:Patient is a 83 y.o. female who was seen today for physical therapy evaluation and treatment for M76.31 (ICD-10-CM) - Iliotibial band syndrome affecting right lower leg.   Pt with previous R TKR within the year and present with R knee flexion, limited to lacking 6 degrees from 0. Pt demonstrating limitations in functional mobility, transfers, safety concerns due to muscle weakness, ROM deficits, and RLE pain. Pt will benefit from skilled Physical Therapy services to address deficits/limitations in order to improve functional and QOL.    OBJECTIVE IMPAIRMENTS: Abnormal gait, decreased activity tolerance, decreased balance, decreased mobility, difficulty walking, decreased ROM, decreased strength, postural dysfunction, and pain.   ACTIVITY LIMITATIONS: carrying, lifting, bending, standing, transfers, and locomotion level  PARTICIPATION LIMITATIONS: shopping, community activity, occupation, and yard work  PERSONAL FACTORS: Age, Behavior pattern, and Time since onset of injury/illness/exacerbation are also affecting patient's functional outcome.   REHAB POTENTIAL: Good  CLINICAL DECISION MAKING: Stable/uncomplicated  EVALUATION COMPLEXITY: Low   GOALS: Goals reviewed with patient? No  SHORT TERM GOALS: Target date: 01/25/2024  Pt will be independent with HEP in order to demonstrate participation in Physical Therapy POC.  Baseline: Goal status: IN PROGRESS  2.  Pt will 6/10 pain during mobility in order to demonstrate improved pain while performing ADLs.  Baseline:  Goal status: IN PROGRESS  LONG TERM GOALS: Target date: 02/15/2024  Pt will improve 30 Second Chair Stand Test by 2 in order to demonstrate improved functional strength to return to desired activities.  Baseline: See objective. 7x at eval Goal status: IN PROGRESS   2.  Pt will improve LEFS score by 8 points  in order to demonstrate improved pain with functional goals and outcomes. Baseline: See objective.  Goal status: IN PROGRESS  3.  Pt will improve R knee ROM  by 0-120 in order to improve mechanics and efficiency during functional activities.. Baseline: See objective.  Goal status: IN PROGRESS  4.  Pt will improve BLE MMT by at least 1/2 grade in order to improve safety concerns and reduce fall risks during functional activities.. Baseline: See objective.  Goal status: IN PROGRESS   PLAN:  PT FREQUENCY: 2x/week  PT DURATION: 6 weeks  PLANNED INTERVENTIONS:  16109- PT Re-evaluation, 97110-Therapeutic exercises, 97530- Therapeutic activity, [id number]- Neuromuscular re-education, [phone]- Self Care, 09811- Manual therapy, [phone]- Gait training, [phone]- Electrical stimulation (unattended), [phone]- Electrical stimulation (manual), [id number]- Traction (mechanical), Balance training, Stair training, Taping, Dry Needling, Joint mobilization, Joint manipulation, Spinal manipulation, Spinal mobilization, Cryotherapy, and Moist heat  PLAN FOR NEXT SESSION: Rt knee ROM for extension, functional strengthening to improve eccentric control and increase focus with balance activities.   Minor Amble, LPTA/CLT; Johnye Napoleon [phone]  4:26 PM, 02/09/24

## 2024-02-11 ENCOUNTER — Other Ambulatory Visit: Payer: Self-pay | Admitting: Orthopedic Surgery

## 2024-02-11 ENCOUNTER — Encounter (HOSPITAL_COMMUNITY)

## 2024-02-11 DIAGNOSIS — M7631 Iliotibial band syndrome, right leg: Secondary | ICD-10-CM

## 2024-02-12 ENCOUNTER — Ambulatory Visit (HOSPITAL_COMMUNITY)

## 2024-02-12 ENCOUNTER — Encounter (HOSPITAL_COMMUNITY): Payer: Self-pay

## 2024-02-12 DIAGNOSIS — M6281 Muscle weakness (generalized): Secondary | ICD-10-CM | POA: Diagnosis not present

## 2024-02-12 DIAGNOSIS — Z96651 Presence of right artificial knee joint: Secondary | ICD-10-CM | POA: Diagnosis not present

## 2024-02-12 DIAGNOSIS — M25661 Stiffness of right knee, not elsewhere classified: Secondary | ICD-10-CM | POA: Diagnosis not present

## 2024-02-12 DIAGNOSIS — M7631 Iliotibial band syndrome, right leg: Secondary | ICD-10-CM | POA: Diagnosis not present

## 2024-02-12 DIAGNOSIS — G8929 Other chronic pain: Secondary | ICD-10-CM | POA: Diagnosis not present

## 2024-02-12 DIAGNOSIS — R262 Difficulty in walking, not elsewhere classified: Secondary | ICD-10-CM | POA: Diagnosis not present

## 2024-02-12 DIAGNOSIS — M25561 Pain in right knee: Secondary | ICD-10-CM | POA: Diagnosis not present

## 2024-02-12 NOTE — Therapy (Signed)
 OUTPATIENT PHYSICAL THERAPY LOWER EXTREMITY TREATMENT/PROGRESSNOTE/DISCHARGE  Progress Note Reporting Period 01/04/24 to 02/15/24  See note below for Objective Data and Assessment of Progress/Goals.   PHYSICAL THERAPY DISCHARGE SUMMARY  Visits from Start of Care: 9  Current functional level related to goals / functional outcomes: See below   Remaining deficits: See below   Education / Equipment: Discharge to daily mobility and HEP   Patient agrees to discharge. Patient goals were partially met. Patient is being discharged due to being pleased with the current functional level.   Patient Name: Wendy Newman MRN: [id number] DOB:30-Nov-1940, 83 y.o., female Today's Date: 02/12/2024  END OF SESSION:  PT End of Session - 02/12/24 0957     Visit Number 9    Number of Visits 12    Date for PT Re-Evaluation 02/15/24    Authorization Type [employer] Dual Complete    Authorization Time Period no auth; no limit    Progress Note Due on Visit 9    PT Start Time 0931    PT Stop Time 0958    PT Time Calculation (min) 27 min    Activity Tolerance Patient tolerated treatment well    Behavior During Therapy WFL for tasks assessed/performed                Past Medical History:  Diagnosis Date   Anxiety    Cataract    DVT (deep venous thrombosis) (HCC)    Edema of both legs    Essential hypertension    Osteoporosis    PONV (postoperative nausea and vomiting)    Vitamin D  deficiency    Past Surgical History:  Procedure Laterality Date   ABDOMINAL HYSTERECTOMY     Complete   CATARACT EXTRACTION Bilateral    CESAREAN SECTION     X 3   COLONOSCOPY  10/10/2008   MFM:[email or web address]   COLONOSCOPY WITH PROPOFOL  N/A 12/21/2014   Dr. Rourk:noncompliant left colon and redundant right colon, rectal and colonic mucosa appeared normal.    TOTAL KNEE ARTHROPLASTY Right 06/16/2023   Procedure: TOTAL KNEE ARTHROPLASTY;  Surgeon: Margrette Taft BRAVO, MD;  Location: [facility];  Service:  Orthopedics;  Laterality: Right;   Patient Active Problem List   Diagnosis Date Noted   Nausea & vomiting 06/17/2023   Osteoarthritis of right knee 06/16/2023   Acute deep vein thrombosis (DVT) of popliteal vein of left lower extremity (HCC) 10/02/2021   Allergic rhinoconjunctivitis 07/02/2017   Family history of colonic polyps 11/28/2014   Family history of colon cancer 11/28/2014   Constipation 11/28/2014   Vitamin D  deficiency 06/16/2013   Hypertension    Anxiety    Osteoporosis    Cataract    Hyperlipemia 05/18/2009   Anxiety state 05/18/2009   NECK PAIN 05/18/2009   ALLERGIC RHINITIS 08/28/2008   OSTEOARTHRITIS 08/28/2008   POSTMENOPAUSAL OSTEOPOROSIS 08/28/2008   PAT 04/24/2008   OTHER ABNORMAL BLOOD CHEMISTRY 04/24/2008   Essential hypertension 12/16/2007   HEADACHE 12/16/2007    PCP: Duanne Butler DASEN, MD  REFERRING PROVIDER: Margrette Taft BRAVO, MD  REFERRING DIAG: M76.31 (ICD-10-CM) - Iliotibial band syndrome affecting right lower leg   THERAPY DIAG:  Iliotibial band syndrome affecting right lower leg  Decreased range of motion (ROM) of right knee  Rationale for Evaluation and Treatment: Rehabilitation  ONSET DATE: 5 months ago  SUBJECTIVE:   SUBJECTIVE STATEMENT: No reports of pain in Rt knee, just tight/stiffness.  Pt understanding today is re-assessment and feels that she is much better.  PERTINENT HISTORY: R TKA 8/24.  PAIN:  Are you having pain? Yes: NPRS scale: 0/10 Pain location: R lateral knee Pain description: sharp Aggravating factors: Transitions Relieving factors: walking around  PRECAUTIONS: None  RED FLAGS: None   WEIGHT BEARING RESTRICTIONS: No  FALLS:  Has patient fallen in last 6 months? No   PATIENT GOALS: get out of pain  NEXT MD VISIT: August   OBJECTIVE:  Note: Objective measures were completed at Evaluation unless otherwise noted.  DIAGNOSTIC FINDINGS:   PATIENT SURVEYS:  LEFS 64/80 = 80%   02/12/2024  LEFS: 74/80 =  92.5%  COGNITION: Overall cognitive status: Within functional limits for tasks assessed     SENSATION: WFL   POSTURE: rounded shoulders and forward head  PALPATION: Tender to palpate on gurdys tubercle and traveling proximal along R lateral right  LOWER EXTREMITY ROM:  Active ROM Right eval Left eval Right 01/29/24 Right 02/12/2024  Hip flexion      Hip extension      Hip abduction      Hip adduction      Hip internal rotation      Hip external rotation      Knee flexion 115 140 122 125  Knee extension -6 0 -4 -2  Ankle dorsiflexion 0 2    Ankle plantarflexion      Ankle inversion      Ankle eversion       (Blank rows = not tested)  LOWER EXTREMITY MMT:  MMT Right eval Left eval Right 01/29/24 Right 02/12/2024 Left 4/18/025  Hip flexion 3+ 4-  4- 4-  Hip extension 3+ 3+  4- 4-  Hip abduction 3+ 3+  4- 4-  Hip adduction       Hip internal rotation       Hip external rotation       Knee flexion 3- 3-  4- 4-  Knee extension 4- 4- 4+ 4+ 5  Ankle dorsiflexion       Ankle plantarflexion       Ankle inversion       Ankle eversion        (Blank rows = not tested)   FUNCTIONAL TESTS:  30 seconds chair stand test: 7x  30 Second Chair Stand test: 10x 02/12/2024  GAIT: Distance walked: 51ft Assistive device utilized: Single point cane Level of assistance: Modified independence Comments: Antalgia through RLE with bilateral knee valgus and constant R knee flexion through stance and swing phase.                                                                                                                                 TREATMENT DATE:  02/12/2024  -LEFS -ROM -MMT -30 Second Chair Stand Test: 10x  Norms:   Age 52-64 107-69 70-74 75-79 80-84 85-89 90-94  Women 15 15 14 13 12 11 9   Men 17 16 15 14 13 11 9    -Educated  on pt's status, and DC from skilled PT services.   02/09/24: Bike seat 7 x5' Holding yellow weighted ball STS then heel raise  with ball overhead 10x 3 eccenctric control Squat front of chair 10x 3 6in power up with 1 HR A 10x 5 Lateral step up 4in step height 10x Step down 4in 10x 1HR Slant board 3x 30 Hamstring stretch 3x 30 on 12 in  Bodycraft walkout 3Pl retro 2Pl sidestep 5RT each SLS Vector stance 3x 5  02/05/24: Bike seat 7 x5' 3103ft no AD Squat front of chair 10x 3 Forward lunge onto 6in step with 1 HHA 15x Stairs 7in reciprocal pattern 2RT Lateral step up 4in 15x 1 HR Step down 4in 15x 1 HR Bodycraft walkout 3Pl retro 2Pl sidestep 5RT each SLS 30 with 1 HHA Tandem stance 2x 30    PATIENT EDUCATION:  Education details: PT Evaluation, findings, prognosis, frequency, attendance policy, and HEP below. Person educated: Patient Education method: Explanation and Demonstration Education comprehension: verbalized understanding  HOME EXERCISE PROGRAM: 02/09/24: - Standing Tandem Balance with Counter Support  - 2 x daily - 7 x weekly - 1 sets - 3 reps - 30 hold - Standing 3-Way Kick  - 1 x daily - 7 x weekly - 3 sets - 10 reps - Single Leg Stance  - 2 x daily - 7 x weekly - 1 sets - 3 reps - 30 hold 02/02/24: - Prone Knee Extension Hang  - 2 x daily - 7 x weekly - 1 sets - 3 reps - 1'+ hold - Standing Terminal Knee Extension at Wall with Ball  - 2 x daily - 7 x weekly - 1 sets - 10 reps - 5 hold 01/21/24  - Supine ITB Stretch with Strap  - 1 x daily - 7 x weekly - 1 sets - 10 reps - 10 sec hold - Sidelying ITB Stretch off Table  - 1 x daily - 7 x weekly - 1 sets - 5 reps - 20 sec hold 01/13/24 - TL Sidebending Stretch - Single Arm Overhead  - 2 x daily - 7 x weekly - 1 sets - 10 reps - 10 sec hold - Hooklying Hamstring Stretch with Strap  - 2 x daily - 7 x weekly - 1 sets - 10 reps - 20 sec hold - Supine Hamstring Stretch  - 2 x daily - 7 x weekly - 1 sets - 10 reps - 20 sec hold  Access Code: [email or web address] URL: [email or web address] Date: 01/04/2024 Prepared by: Omega Bottcher  Exercises - Supine Bridge with Resistance Band  - 1 x daily - 7 x weekly - 3 sets - 12-15 reps - 3 hold - Clamshell with Resistance  - 1 x daily - 7 x weekly - 3 sets - 12-15 reps - 3 hold - Standing Ankle Dorsiflexion Stretch  - 1 x daily - 7 x weekly - 3 sets - 10 reps - 30 hold  ASSESSMENT:  CLINICAL IMPRESSION: Progress note today. Pt meeting all but one LTG related to full knee extension. Pt has made notable progress, lacking 2 degrees from 0. Pt does not note any concerns, noticeable improvements in endurance, strength and more mobility in R knee. Pt agrees to DC from skilled PT services at this time.    OBJECTIVE IMPAIRMENTS: Abnormal gait, decreased activity tolerance, decreased balance, decreased mobility, difficulty walking, decreased ROM, decreased strength, postural dysfunction, and pain.   ACTIVITY LIMITATIONS: carrying, lifting, bending, standing, transfers, and locomotion  level  PARTICIPATION LIMITATIONS: shopping, community activity, occupation, and yard work  PERSONAL FACTORS: Age, Behavior pattern, and Time since onset of injury/illness/exacerbation are also affecting patient's functional outcome.   REHAB POTENTIAL: Good  CLINICAL DECISION MAKING: Stable/uncomplicated  EVALUATION COMPLEXITY: Low   GOALS: Goals reviewed with patient? No  SHORT TERM GOALS: Target date: 01/25/2024  Pt will be independent with HEP in order to demonstrate participation in Physical Therapy POC.  Baseline: Goal status: MET  2.  Pt will 6/10 pain during mobility in order to demonstrate improved pain while performing ADLs.  Baseline:  Goal status: MET  LONG TERM GOALS: Target date: 02/15/2024  Pt will improve 30 Second Chair Stand Test by 2 in order to demonstrate improved functional strength to return to desired activities.  Baseline: See objective. 7x at eval Goal status: MET   2.  Pt will improve LEFS score by 8 points in order to demonstrate improved pain with  functional goals and outcomes. Baseline: See objective.  Goal status: MET  3.  Pt will improve R knee ROM  by 0-120 in order to improve mechanics and efficiency during functional activities.. Baseline: See objective.  Goal status: NOT MET  4.  Pt will improve BLE MMT by at least 1/2 grade in order to improve safety concerns and reduce fall risks during functional activities.. Baseline: See objective.  Goal status: MET   PLAN:  PT FREQUENCY: 2x/week  PT DURATION: 6 weeks  PLANNED INTERVENTIONS: 97164- PT Re-evaluation, 97110-Therapeutic exercises, 97530- Therapeutic activity, 97112- Neuromuscular re-education, 97535- Self Care, 02859- Manual therapy, [phone]- Gait training, [phone]- Electrical stimulation (unattended), [phone]- Electrical stimulation (manual), [id number]- Traction (mechanical), Balance training, Stair training, Taping, Dry Needling, Joint mobilization, Joint manipulation, Spinal manipulation, Spinal mobilization, Cryotherapy, and Moist heat  PLAN FOR NEXT SESSION: Discharge   Omega JONETTA Donna ALMETA, DPT [facility]-  [phone] office  9:58 AM, 02/12/24

## 2024-02-16 ENCOUNTER — Encounter (HOSPITAL_COMMUNITY)

## 2024-02-18 ENCOUNTER — Encounter (HOSPITAL_COMMUNITY)

## 2024-02-19 ENCOUNTER — Encounter (HOSPITAL_COMMUNITY)

## 2024-02-23 ENCOUNTER — Encounter (HOSPITAL_COMMUNITY)

## 2024-02-23 ENCOUNTER — Other Ambulatory Visit: Payer: Self-pay

## 2024-02-23 NOTE — Telephone Encounter (Signed)
 Prescription Request  02/23/2024  LOV: 01/25/24  What is the name of the medication or equipment? meloxicam  (MOBIC ) 15 MG tablet [[id number]]   Have you contacted your pharmacy to request a refill? Yes   Which pharmacy would you like this sent to?  [facility] Delivery) [place], [place] - [address] [facility] [address] [place] [address] [place] [address] Phone: [phone] Fax: [phone]  [facility] Delivery - [place], [place] - [address] [place] [address] Phone: [phone] Fax: [phone]    Patient notified that their request is being sent to the clinical staff for review and that they should receive a response within 2 business days.   Please advise at [facility] [place] [phone]

## 2024-02-25 ENCOUNTER — Other Ambulatory Visit: Payer: Self-pay | Admitting: Family Medicine

## 2024-02-25 DIAGNOSIS — F411 Generalized anxiety disorder: Secondary | ICD-10-CM

## 2024-02-25 MED ORDER — MELOXICAM 15 MG PO TABS: 15.0000 mg | ORAL_TABLET | Freq: Every day | ORAL | 3 refills | Status: DC

## 2024-02-25 NOTE — Telephone Encounter (Signed)
 2cd request: Prescription Request  02/25/2024  LOV: 01/25/24  What is the name of the medication or equipment? meloxicam  (MOBIC ) 15 MG tablet [[id number]]   Have you contacted your pharmacy to request a refill? Yes   Which pharmacy would you like this sent to?  [facility] Delivery) [place], [place] - [address] [facility] [address] [place] [address] [place] [address] Phone: [phone] Fax: [phone]  [facility] Delivery - [place], [place] - [address] [place]  [address] Phone: [phone] Fax: [phone]    Patient notified that their request is being sent to the clinical staff for review and that they should receive a response within 2 business days.   Please advise at [facility] [phone]

## 2024-02-25 NOTE — Telephone Encounter (Signed)
 Patient calling to follow up on rx request for meloxicam , patient only has one tablet left. Pt received message from [facility] and it stated if they did not hear from anyone order would be cancelled.   Pt requesting rx to be sent in as soon as possible.

## 2024-02-29 NOTE — Telephone Encounter (Signed)
 Requested Prescriptions  Pending Prescriptions Disp Refills   citalopram  (CELEXA ) 20 MG tablet [Pharmacy Med Name: Citalopram  Hydrobromide 20 MG Oral Tablet] 90 tablet 0    Sig: TAKE 1 TABLET BY MOUTH DAILY     Psychiatry:  Antidepressants - SSRI Passed - 02/29/2024  1:38 PM      Passed - Valid encounter within last 6 months    Recent Outpatient Visits           1 month ago Benign essential HTN   [place] [facility] Austine Lefort, MD   7 months ago History of total right knee replacement   [place] [facility] Cheril Cork, Cisco Crest, MD   1 year ago Essential hypertension   [place] [facility] Austine Lefort, MD   1 year ago Acute pain of right knee   [place] [facility] Jenelle Mis, FNP   1 year ago Benign essential HTN   [facility] Pickard, Cisco Crest, MD       Future Appointments             In 3 months Darrin Emerald, MD [facility]

## 2024-04-02 ENCOUNTER — Other Ambulatory Visit: Payer: Self-pay | Admitting: Family Medicine

## 2024-04-02 DIAGNOSIS — E7800 Pure hypercholesterolemia, unspecified: Secondary | ICD-10-CM

## 2024-05-08 ENCOUNTER — Other Ambulatory Visit: Payer: Self-pay | Admitting: Family Medicine

## 2024-05-10 NOTE — Telephone Encounter (Signed)
 Requested Prescriptions  Pending Prescriptions Disp Refills   meloxicam  (MOBIC ) 15 MG tablet [Pharmacy Med Name: Meloxicam  15 MG Oral Tablet] 100 tablet 2    Sig: TAKE 1 TABLET BY MOUTH DAILY     Analgesics:  COX2 Inhibitors Failed - 05/10/2024  2:25 PM      Failed - Manual Review: Labs are only required if the patient has taken medication for more than 8 weeks.      Passed - HGB in normal range and within 360 days    Hemoglobin  Date Value Ref Range Status  01/25/2024 12.1 11.7 - 15.5 g/dL Final         Passed - Cr in normal range and within 360 days    Creat  Date Value Ref Range Status  01/25/2024 0.84 0.60 - 0.95 mg/dL Final         Passed - HCT in normal range and within 360 days    HCT  Date Value Ref Range Status  01/25/2024 37.5 35.0 - 45.0 % Final         Passed - AST in normal range and within 360 days    AST  Date Value Ref Range Status  01/25/2024 16 10 - 35 U/L Final         Passed - ALT in normal range and within 360 days    ALT  Date Value Ref Range Status  01/25/2024 9 6 - 29 U/L Final         Passed - eGFR is 30 or above and within 360 days    GFR, Est African American  Date Value Ref Range Status  12/14/2020 68 > OR = 60 mL/min/1.68m2 Final   GFR, Est Non African American  Date Value Ref Range Status  12/14/2020 59 (L) > OR = 60 mL/min/1.24m2 Final   GFR, Estimated  Date Value Ref Range Status  01/17/2024 60 (L) >60 mL/min Final    Comment:    (NOTE) Calculated using the CKD-EPI Creatinine Equation (2021)    eGFR  Date Value Ref Range Status  07/06/2023 63 > OR = 60 mL/min/1.49m2 Final         Passed - Patient is not pregnant      Passed - Valid encounter within last 12 months    Recent Outpatient Visits           3 months ago Benign essential HTN   [place] [facility] Pickard, Butler DASEN, MD   10 months ago History of total right knee replacement   [place] [facility] Duanne, Butler DASEN, MD    1 year ago Essential hypertension   [place] [facility] Duanne Butler DASEN, MD   1 year ago Acute pain of right knee   [place] [facility] Kayla Jeoffrey RAMAN, FNP   1 year ago Benign essential HTN   [place] [facility] Pickard, Butler DASEN, MD       Future Appointments             In 1 month Margrette Taft BRAVO, MD [facility]

## 2024-05-21 ENCOUNTER — Other Ambulatory Visit: Payer: Self-pay | Admitting: Family Medicine

## 2024-05-21 DIAGNOSIS — F411 Generalized anxiety disorder: Secondary | ICD-10-CM

## 2024-05-29 ENCOUNTER — Encounter (HOSPITAL_COMMUNITY): Payer: Self-pay

## 2024-05-29 ENCOUNTER — Emergency Department (HOSPITAL_COMMUNITY)
Admission: EM | Admit: 2024-05-29 | Discharge: 2024-05-29 | Discharge status: Against Medical Advice | Attending: Emergency Medicine | Admitting: Emergency Medicine

## 2024-05-29 ENCOUNTER — Other Ambulatory Visit: Payer: Self-pay

## 2024-05-29 DIAGNOSIS — Z79899 Other long term (current) drug therapy: Secondary | ICD-10-CM | POA: Insufficient documentation

## 2024-05-29 DIAGNOSIS — I10 Essential (primary) hypertension: Secondary | ICD-10-CM | POA: Diagnosis not present

## 2024-05-29 DIAGNOSIS — M79605 Pain in left leg: Secondary | ICD-10-CM | POA: Insufficient documentation

## 2024-05-29 DIAGNOSIS — M79662 Pain in left lower leg: Secondary | ICD-10-CM | POA: Diagnosis not present

## 2024-05-29 LAB — CBC WITH DIFFERENTIAL/PLATELET
Abs Immature Granulocytes: 0.02 K/uL (ref 0.00–0.07)
Basophils Absolute: 0 K/uL (ref 0.0–0.1)
Basophils Relative: 0 %
Eosinophils Absolute: 0.1 K/uL (ref 0.0–0.5)
Eosinophils Relative: 1 %
HCT: 40 % (ref 36.0–46.0)
Hemoglobin: 12.8 g/dL (ref 12.0–15.0)
Immature Granulocytes: 0 %
Lymphocytes Relative: 35 %
Lymphs Abs: 2.9 K/uL (ref 0.7–4.0)
MCH: 29.8 pg (ref 26.0–34.0)
MCHC: 32 g/dL (ref 30.0–36.0)
MCV: 93.2 fL (ref 80.0–100.0)
Monocytes Absolute: 0.7 K/uL (ref 0.1–1.0)
Monocytes Relative: 8 %
Neutro Abs: 4.6 K/uL (ref 1.7–7.7)
Neutrophils Relative %: 56 %
Platelets: 204 K/uL (ref 150–400)
RBC: 4.29 MIL/uL (ref 3.87–5.11)
RDW: 13.8 % (ref 11.5–15.5)
WBC: 8.4 K/uL (ref 4.0–10.5)
nRBC: 0 % (ref 0.0–0.2)

## 2024-05-29 LAB — BASIC METABOLIC PANEL WITH GFR
Anion gap: 12 (ref 5–15)
BUN: 32 mg/dL — ABNORMAL HIGH (ref 8–23)
CO2: 25 mmol/L (ref 22–32)
Calcium: 9.3 mg/dL (ref 8.9–10.3)
Chloride: 104 mmol/L (ref 98–111)
Creatinine, Ser: 0.91 mg/dL (ref 0.44–1.00)
GFR, Estimated: >60 mL/min (ref >60)
Glucose, Bld: 95 mg/dL (ref 70–99)
Potassium: 3.8 mmol/L (ref 3.5–5.1)
Sodium: 141 mmol/L (ref 135–145)

## 2024-05-29 LAB — D-DIMER, QUANTITATIVE: D-Dimer, Quant: 0.61 ug{FEU}/mL — ABNORMAL HIGH (ref 0.00–0.50)

## 2024-05-29 NOTE — ED Triage Notes (Signed)
 Pt stated that the front of her left lower leg has been hurting for a few days but got worse today. Pt stated that she has hx of DVT in same leg. No swelling or discoloration noted.

## 2024-05-29 NOTE — ED Notes (Addendum)
 Pts granddaughter made pt leave AMA due to the ride situation. Pt stated that she will be back tomorrow for US 

## 2024-05-30 ENCOUNTER — Emergency Department (HOSPITAL_COMMUNITY)

## 2024-05-30 ENCOUNTER — Encounter (HOSPITAL_COMMUNITY): Payer: Self-pay | Admitting: *Deleted

## 2024-05-30 ENCOUNTER — Other Ambulatory Visit: Payer: Self-pay

## 2024-05-30 ENCOUNTER — Emergency Department (HOSPITAL_COMMUNITY)
Admission: EM | Admit: 2024-05-30 | Discharge: 2024-05-30 | Disposition: A | Discharge status: Home / Self Care | Attending: Emergency Medicine | Admitting: Emergency Medicine

## 2024-05-30 DIAGNOSIS — M79605 Pain in left leg: Secondary | ICD-10-CM | POA: Insufficient documentation

## 2024-05-30 DIAGNOSIS — M79662 Pain in left lower leg: Secondary | ICD-10-CM | POA: Diagnosis not present

## 2024-05-30 DIAGNOSIS — R600 Localized edema: Secondary | ICD-10-CM | POA: Diagnosis not present

## 2024-05-30 NOTE — ED Triage Notes (Signed)
 Pt here for US  to left lower leg due to pain.  Hx of DVT about 2 years ago and not currently on blood thinner.

## 2024-05-30 NOTE — ED Provider Notes (Signed)
 [place] EMERGENCY DEPARTMENT AT [facility] Provider Note   CSN: [id number] Arrival date & time: 05/30/24  1418     Patient presents with: Leg Pain   Wendy Newman is a 83 y.o. female.   Patient returns today as instructed for DVT study of the left lower extremity. She was seen yesterday, c/o pain and swelling in that leg, with history of clot 2 years ago, no longer anticoagulated. No shortness of breath or chest pain. She returns today as doppler was not available yesterday.   The history is provided by the patient. No language interpreter was used.  Leg Pain      Prior to Admission medications   Medication Sig Start Date End Date Taking? Authorizing Provider  atorvastatin  (LIPITOR) 20 MG tablet TAKE 1 TABLET BY MOUTH ONCE  DAILY 04/04/24   Duanne Butler DASEN, MD  bisacodyl  (DULCOLAX) 5 MG EC tablet Take 1 tablet (5 mg total) by mouth daily as needed for moderate constipation. 06/18/23   Margrette Taft BRAVO, MD  CALCIUM -MAGNESIUM -ZINC -VIT D3 PO Take 1 tablet by mouth daily.    [provider]  citalopram  (CELEXA ) 20 MG tablet TAKE 1 TABLET BY MOUTH DAILY 02/29/24   Duanne Butler DASEN, MD  fluticasone  (FLONASE ) 50 MCG/ACT nasal spray Place 2 sprays into both nostrils daily. 05/19/23   Duanne Butler DASEN, MD  hydrochlorothiazide  (HYDRODIURIL ) 25 MG tablet Take 1 tablet (25 mg total) by mouth daily. 10/29/23   Duanne Butler DASEN, MD  levocetirizine (XYZAL ) 5 MG tablet TAKE 1 TABLET BY MOUTH DAILY IN  THE EVENING 12/03/23   Duanne Butler DASEN, MD  meloxicam  (MOBIC ) 15 MG tablet TAKE 1 TABLET BY MOUTH DAILY 05/10/24   Duanne Butler DASEN, MD  naproxen  (NAPROSYN ) 500 MG tablet TAKE 1 TABLET BY MOUTH TWICE  DAILY WITH A MEAL 02/15/24   Margrette Taft BRAVO, MD  polyethylene glycol (MIRALAX ) 17 g packet Take 17 g by mouth daily. 01/17/24   Suellen Cantor A, PA-C    Allergies: Patient has no known allergies.    Review of Systems  Updated Vital Signs BP (!) 160/84 (BP Location:  Right Arm)   Pulse 60   Temp 98.4 F (36.9 C) (Oral)   Resp 16   Ht 5' 6 (1.676 m)   Wt 72.6 kg   SpO2 99%   BMI 25.83 kg/m   Physical Exam Vitals and nursing note reviewed.  Constitutional:      General: She is not in acute distress.    Appearance: She is well-developed. She is not ill-appearing.  Pulmonary:     Effort: Pulmonary effort is normal.  Musculoskeletal:        General: Normal range of motion.     Cervical back: Normal range of motion.     Comments: Lower left leg is mildly tender to posterior and lateral musculature. No significant swelling or discoloration. Distal pulse 2+.  Skin:    General: Skin is warm and dry.  Neurological:     Mental Status: She is alert and oriented to person, place, and time.     (all labs ordered are listed, but only abnormal results are displayed) Labs Reviewed - No data to display  EKG: None  Radiology: US  Venous Img Lower  Left (DVT Study) Result Date: 05/30/2024 CLINICAL DATA:  LEFT lower extremity pain and edema for 2-3 days EXAM: LEFT LOWER EXTREMITY VENOUS DOPPLER ULTRASOUND TECHNIQUE: Gray-scale sonography with compression, as well as color and duplex ultrasound, were performed to  evaluate the deep venous system(s) from the level of the common femoral vein through the popliteal and proximal calf veins. COMPARISON:  None available FINDINGS: VENOUS Normal compressibility of the common femoral, superficial femoral, and popliteal veins, as well as the visualized calf veins. Visualized portions of profunda femoral vein and great saphenous vein unremarkable. No filling defects to suggest DVT on grayscale or color Doppler imaging. Doppler waveforms show normal direction of venous flow, normal respiratory plasticity and response to augmentation. Limited views of the contralateral common femoral vein are unremarkable. OTHER None. Limitations: none IMPRESSION: No DVT of the left lower extremity. Electronically Signed   By: Aliene Lloyd M.D.    On: 05/30/2024 15:54     Procedures   Medications Ordered in the ED - No data to display  Clinical Course as of 05/30/24 1628  Mon May 30, 2024  1627 Doppler study of the left lower extremity negative for clots. Likely MSK pain. No evidence of infection. Follow up PCP.  [SU]    Clinical Course User Index [SU] Odell Balls, PA-C                                 Medical Decision Making       Final diagnoses:  Left leg pain    ED Discharge Orders     None          Odell Balls RIGGERS 05/30/24 1629    Garrick Charleston, MD 05/30/24 2309

## 2024-05-30 NOTE — ED Provider Notes (Signed)
 [place] EMERGENCY DEPARTMENT AT [facility] Provider Note   CSN: [id number] Arrival date & time: 05/29/24  1437     Patient presents with: Leg Pain   Wendy Newman is a 83 y.o. female.    Leg Pain Associated symptoms: no fever        Wendy Newman is a 83 y.o. female past medical history of anxiety, hypertension, and prior DVT (no longer anticoagulated) who presents to the Emergency Department complaining of pain to the anterior left lower leg.  States the pain has been present for several days but worse upon waking.  She denies known injury, pain is worse with weightbearing and movement of her foot.  She denies any chest pain or shortness of breath.  She also denies any recent travel or extended periods of sitting.  Prior to Admission medications   Medication Sig Start Date End Date Taking? Authorizing Provider  atorvastatin  (LIPITOR) 20 MG tablet TAKE 1 TABLET BY MOUTH ONCE  DAILY 04/04/24   Duanne Butler DASEN, MD  bisacodyl  (DULCOLAX) 5 MG EC tablet Take 1 tablet (5 mg total) by mouth daily as needed for moderate constipation. 06/18/23   Margrette Taft BRAVO, MD  CALCIUM -MAGNESIUM -ZINC -VIT D3 PO Take 1 tablet by mouth daily.    [provider]  citalopram  (CELEXA ) 20 MG tablet TAKE 1 TABLET BY MOUTH DAILY 02/29/24   Duanne Butler DASEN, MD  fluticasone  (FLONASE ) 50 MCG/ACT nasal spray Place 2 sprays into both nostrils daily. 05/19/23   Duanne Butler DASEN, MD  hydrochlorothiazide  (HYDRODIURIL ) 25 MG tablet Take 1 tablet (25 mg total) by mouth daily. 10/29/23   Duanne Butler DASEN, MD  levocetirizine (XYZAL ) 5 MG tablet TAKE 1 TABLET BY MOUTH DAILY IN  THE EVENING 12/03/23   Duanne Butler DASEN, MD  meloxicam  (MOBIC ) 15 MG tablet TAKE 1 TABLET BY MOUTH DAILY 05/10/24   Duanne Butler DASEN, MD  naproxen  (NAPROSYN ) 500 MG tablet TAKE 1 TABLET BY MOUTH TWICE  DAILY WITH A MEAL 02/15/24   Margrette Taft BRAVO, MD  polyethylene glycol (MIRALAX ) 17 g packet Take 17 g by mouth daily.  01/17/24   Suellen Cantor A, PA-C    Allergies: Patient has no known allergies.    Review of Systems  Constitutional:  Negative for chills and fever.  Respiratory:  Negative for shortness of breath.   Cardiovascular:  Negative for chest pain.  Gastrointestinal:  Negative for nausea and vomiting.  Musculoskeletal:  Positive for myalgias (Left lower leg pain).  Skin:  Negative for color change, rash and wound.  Neurological:  Negative for weakness and numbness.    Updated Vital Signs BP (!) 166/80   Pulse (!) 59   Temp 98.4 F (36.9 C)   Resp 18   Ht 5' 6 (1.676 m)   Wt 72.6 kg   SpO2 100%   BMI 25.82 kg/m   Physical Exam Vitals and nursing note reviewed.  Constitutional:      General: She is not in acute distress.    Appearance: Normal appearance. She is not toxic-appearing.  Cardiovascular:     Rate and Rhythm: Normal rate and regular rhythm.     Pulses: Normal pulses.  Pulmonary:     Effort: Pulmonary effort is normal.  Musculoskeletal:        General: Tenderness present. Normal range of motion.     Right lower leg: No edema.     Left lower leg: Tenderness present. No deformity or bony tenderness. No edema.  Comments: Localized tenderness palpation anterior left lower leg.  I do not appreciate any edema, erythema, or rash.  No posterior calf pain.  Left knee is nontender on range of motion.  Skin:    General: Skin is warm.  Neurological:     Mental Status: She is alert.     (all labs ordered are listed, but only abnormal results are displayed) Labs Reviewed  D-DIMER, QUANTITATIVE - Abnormal; Notable for the following components:      Result Value   D-Dimer, Quant 0.61 (*)    All other components within normal limits  BASIC METABOLIC PANEL WITH GFR - Abnormal; Notable for the following components:   BUN 32 (*)    All other components within normal limits  CBC WITH DIFFERENTIAL/PLATELET    EKG: None  Radiology: No results found.   Procedures    Medications Ordered in the ED - No data to display                                  Medical Decision Making Patient here for evaluation of anterior left lower leg pain for few days.  She does have history of DVT no longer on anticoagulant.  She denies any recent travel or extended periods of sitting.  She is not having any chest pain or shortness of breath.  I suspect this is musculoskeletal, but given history of prior DVT, I will obtain labs including D-dimer.  Unfortunately, ultrasound imaging unavailable at this time  Amount and/or Complexity of Data Reviewed Labs: ordered.    Details: Labs show mildly elevated D-dimer otherwise unremarkable Discussion of management or test interpretation with external provider(s):   1550 informed that patient left the department prior to completion of my workup.  I was informed by nursing staff that patient stated that she would return for ultrasound tomorrow.  I will place order for outpatient DVT study        Final diagnoses:  Left leg pain    ED Discharge Orders          Ordered    US  Venous Img Lower Unilateral Left        05/29/24 1600               Herlinda Milling, PA-C 05/30/24 1536    Melvenia Motto, MD 05/30/24 2352

## 2024-05-30 NOTE — ED Provider Triage Note (Signed)
 Emergency Medicine Provider Triage Evaluation Note  Wendy Newman , a 83 y.o. female  was evaluated in triage.  Pt complains of left calf/lower leg pain and swelling without injury. DVT in same leg 2 years ago. Not anticoagulated. No SOB, CP.  Review of Systems  Positive: Left lower leg pain and swelling, positive D-dimer yesterday Negative: CP, SOB  Physical Exam  BP (!) 160/84 (BP Location: Right Arm)   Pulse 60   Temp 98.4 F (36.9 C) (Oral)   Resp 16   Ht 5' 6 (1.676 m)   Wt 72.6 kg   SpO2 99%   BMI 25.83 kg/m  Gen:   Awake, no distress   Resp:  Normal effort  MSK:   Moves extremities without difficulty  Other:  Left leg minimally swollen. Tender over calf. No discoloration.   Medical Decision Making  Medically screening exam initiated at 3:18 PM.  Appropriate orders placed.  MARIENE DICKERMAN was informed that the remainder of the evaluation will be completed by another provider, this initial triage assessment does not replace that evaluation, and the importance of remaining in the ED until their evaluation is complete.  Venous doppler pending.   Odell Balls, PA-C 05/30/24 1519

## 2024-05-30 NOTE — Discharge Instructions (Signed)
 Your DVT study is negative for recurrent clot. The discomfort is likely musculoskeletal. Continue meloxicam  daily which is an anti-inflammatory and should help. Rest the leg. Follow up with your doctor for recheck in a week if pain persists.

## 2024-06-09 ENCOUNTER — Encounter: Payer: Self-pay | Admitting: Orthopedic Surgery

## 2024-06-09 ENCOUNTER — Other Ambulatory Visit (INDEPENDENT_AMBULATORY_CARE_PROVIDER_SITE_OTHER)

## 2024-06-09 ENCOUNTER — Ambulatory Visit (INDEPENDENT_AMBULATORY_CARE_PROVIDER_SITE_OTHER): Payer: 59 | Admitting: Orthopedic Surgery

## 2024-06-09 DIAGNOSIS — Z96651 Presence of right artificial knee joint: Secondary | ICD-10-CM | POA: Diagnosis not present

## 2024-06-09 DIAGNOSIS — M1711 Unilateral primary osteoarthritis, right knee: Secondary | ICD-10-CM

## 2024-06-09 DIAGNOSIS — M7631 Iliotibial band syndrome, right leg: Secondary | ICD-10-CM

## 2024-06-09 MED ORDER — METHYLPREDNISOLONE ACETATE 40 MG/ML IJ SUSP
40.0000 mg | Freq: Once | INTRAMUSCULAR | Status: AC
  Administered 2024-06-09: 40 mg via INTRA_ARTICULAR

## 2024-06-09 NOTE — Progress Notes (Signed)
   There were no vitals taken for this visit.  There is no height or weight on file to calculate BMI.  Chief Complaint  Patient presents with   Post-op Follow-up    Right knee replaced     Encounter Diagnoses  Name Primary?   S/P total knee replacement, right 06/16/23 Yes   Unilateral primary osteoarthritis, right knee     DOI/DOS/ Date: 06/16/23  Improved but has some tightness lateral to patella (tx w naproxen  and stretching)

## 2024-06-09 NOTE — Progress Notes (Signed)
 FU APPT  1 YR TKA RT KNEE   Chief Complaint  Patient presents with   Post-op Follow-up    Right knee replaced     Encounter Diagnoses  Name Primary?   S/P total knee replacement, right 06/16/23 Yes   Unilateral primary osteoarthritis, right knee    Iliotibial band syndrome of right side     DOI/DOS/ Date: 06/16/23  Improved but has some tightness lateral to patella (tx w naproxen  and stretching)   ANNUAL FOLLOW UP FOR right TKA   Chief Complaint  Patient presents with   Post-op Follow-up    Right knee replaced      HPI: The patient is here for the annual  follow-up x-ray for knee replacement. The patient is not complaining of pain weakness instability or stiffness in the repaired knee.    Examination of the RIGHT  KNEE  There were no vitals taken for this visit. General the patient is normally groomed in no distress Inspection shows : incision healed nicely without erythema, no swelling THERE IS TENDERNESS AT [facility] TUBERCLE  Range of motion total range of motion is 0120 Stability the knee is stable anterior to posterior as well as medial to lateral Strength quadriceps strength is normal Skin no erythema around the skin incision Neuro: normal sensation in the operative leg  Gait: normal expected gait without cane    Medical decision-making section X-rays ordered, internal imaging shows (see full dictated report) stable implant with no signs of loosening  Diagnosis  Encounter Diagnoses  Name Primary?   S/P total knee replacement, right 06/16/23 Yes   Unilateral primary osteoarthritis, right knee    Iliotibial band syndrome of right side    SHE is willing to try injection Continue naproxen  , ice   Plan follow-up 1 year repeat x-rays    A steroid injection was performed at Gertie's tubercle of the right knee using 1% plain Lidocaine and 1 mg of Depo-Medrol . This was well tolerated.

## 2024-06-22 ENCOUNTER — Ambulatory Visit (INDEPENDENT_AMBULATORY_CARE_PROVIDER_SITE_OTHER): Admitting: Family Medicine

## 2024-06-22 ENCOUNTER — Ambulatory Visit: Payer: Self-pay | Admitting: *Deleted

## 2024-06-22 ENCOUNTER — Encounter: Payer: Self-pay | Admitting: Family Medicine

## 2024-06-22 VITALS — BP 147/85 | HR 61 | Temp 97.7°F | Ht 66.0 in | Wt 169.2 lb

## 2024-06-22 DIAGNOSIS — M5432 Sciatica, left side: Secondary | ICD-10-CM | POA: Diagnosis not present

## 2024-06-22 MED ORDER — PREDNISONE 20 MG PO TABS: ORAL_TABLET | ORAL | 0 refills | Status: AC

## 2024-06-22 NOTE — Progress Notes (Signed)
 Subjective:  HPI: Wendy Newman is a 83 y.o. female presenting on 06/22/2024 for Acute Visit (Achy Left leg pain from the hip down to the foot x 2 weeks )   HPI Patient is in today for left lower extremity pain for 2 weeks. Was seen in ED for similar on 05/29/2024 and 05/30/2024. DVT was ruled out, does have hx of DVT 2 years ago. Has also seen orthopedics for this on 06/09/2024 and diagnosed with IT band syndrome. Symptoms today described as starting in her left buttock and radiates down to her anterior knee and shin. Denies weakness, numbness, tingling, saddle numbness, incontinence of urine or stool. Has tried heat, ice, tylenol . Is ambulating with a cane today due to acute pain. Discussed risk of GI bleed with simultaneous prednisone , meloxicam , ibuprofen  Review of Systems  All other systems reviewed and are negative.   Relevant past medical history reviewed and updated as indicated.   Past Medical History:  Diagnosis Date   Anxiety    Cataract    DVT (deep venous thrombosis) (HCC)    Edema of both legs    Essential hypertension    Osteoporosis    PONV (postoperative nausea and vomiting)    Vitamin D  deficiency      Past Surgical History:  Procedure Laterality Date   ABDOMINAL HYSTERECTOMY     Complete   CATARACT EXTRACTION Bilateral    CESAREAN SECTION     X 3   COLONOSCOPY  10/10/2008   MFM:[email or web address]   COLONOSCOPY WITH PROPOFOL  N/A 12/21/2014   Dr. Rourk:noncompliant left colon and redundant right colon, rectal and colonic mucosa appeared normal.    TOTAL KNEE ARTHROPLASTY Right 06/16/2023   Procedure: TOTAL KNEE ARTHROPLASTY;  Surgeon: Margrette Taft BRAVO, MD;  Location: [facility];  Service: Orthopedics;  Laterality: Right;    Allergies and medications reviewed and updated.   Current Outpatient Medications:    atorvastatin  (LIPITOR) 20 MG tablet, TAKE 1 TABLET BY MOUTH ONCE  DAILY, Disp: 100 tablet, Rfl: 2   bisacodyl  (DULCOLAX) 5 MG EC tablet, Take 1 tablet (5 mg  total) by mouth daily as needed for moderate constipation., Disp: 90 each, Rfl: 0   CALCIUM -MAGNESIUM -ZINC -VIT D3 PO, Take 1 tablet by mouth daily., Disp: , Rfl:    citalopram  (CELEXA ) 20 MG tablet, TAKE 1 TABLET BY MOUTH DAILY, Disp: 90 tablet, Rfl: 0   fluticasone  (FLONASE ) 50 MCG/ACT nasal spray, Place 2 sprays into both nostrils daily., Disp: 48 g, Rfl: 2   hydrochlorothiazide  (HYDRODIURIL ) 25 MG tablet, Take 1 tablet (25 mg total) by mouth daily., Disp: 90 tablet, Rfl: 1   levocetirizine (XYZAL ) 5 MG tablet, TAKE 1 TABLET BY MOUTH DAILY IN  THE EVENING, Disp: 100 tablet, Rfl: 1   meloxicam  (MOBIC ) 15 MG tablet, TAKE 1 TABLET BY MOUTH DAILY, Disp: 100 tablet, Rfl: 1   naproxen  (NAPROSYN ) 500 MG tablet, TAKE 1 TABLET BY MOUTH TWICE  DAILY WITH A MEAL, Disp: 180 tablet, Rfl: 3   polyethylene glycol (MIRALAX ) 17 g packet, Take 17 g by mouth daily., Disp: 14 each, Rfl: 0   predniSONE  (DELTASONE ) 20 MG tablet, 3 tabs poqday 1-2, 2 tabs poqday 3-4, 1 tab poqday 5-6, hold meloxicam  while taking, Disp: 12 tablet, Rfl: 0  No Known Allergies  Objective:   BP (!) 147/85   Pulse 61   Temp 97.7 F (36.5 C)   Ht 5' 6 (1.676 m)   Wt 169 lb 3.2 oz (76.7 kg)   SpO2  98%   BMI 27.31 kg/m      06/22/2024    3:18 PM 05/30/2024    4:35 PM 05/30/2024    2:38 PM  Vitals with BMI  Height 5' 6    Weight 169 lbs 3 oz    BMI 27.32    Systolic 147 178 839  Diastolic 85 76 84  Pulse 61 60 60     Physical Exam Vitals and nursing note reviewed.  Constitutional:      Appearance: Normal appearance. She is normal weight.  HENT:     Head: Normocephalic and atraumatic.  Musculoskeletal:     Thoracic back: Normal.     Lumbar back: Positive left straight leg raise test.     Right hip: Normal.     Left hip: Normal.     Right upper leg: Normal.     Left upper leg: Normal.     Right knee: Normal.     Left knee: Normal.  Skin:    General: Skin is warm and dry.  Neurological:     General: No focal  deficit present.     Mental Status: She is alert and oriented to person, place, and time. Mental status is at baseline.  Psychiatric:        Mood and Affect: Mood normal.        Behavior: Behavior normal.        Thought Content: Thought content normal.        Judgment: Judgment normal.     Assessment & Plan:  Left sided sciatica Assessment & Plan: Symptoms consistent with left sciatica. Start prednisone  taper. Discussed risks of GI bleed with simultaneous Mobic , Ibuprofen. Will hold Mobic  while on Prednisone . Home stretches provided. If continue may continue with PT. Consider lumbar imaging. Follow up PRN  Orders: -     Ambulatory referral to Physical Therapy  Other orders -     predniSONE ; 3 tabs poqday 1-2, 2 tabs poqday 3-4, 1 tab poqday 5-6, hold meloxicam  while taking  Dispense: 12 tablet; Refill: 0     Follow up plan: Return if symptoms worsen or fail to improve.  Jeoffrey GORMAN Barrio, FNP

## 2024-06-22 NOTE — Telephone Encounter (Signed)
 FYI Only or Action Required?: FYI only for provider.  Patient was last seen in primary care on 01/25/2024 by Wendy Newman DASEN, MD.  Called Nurse Triage reporting Leg Pain.  Symptoms began several weeks ago.  Interventions attempted: Rest, hydration, or home remedies.  Symptoms are: gradually worsening.  Triage Disposition: See HCP Within 4 Hours (Or PCP Triage)  Patient/caregiver understands and will follow disposition?: yes    Reason for Disposition  [1] SEVERE pain (e.g., excruciating, unable to do any normal activities) AND [2] not improved after 2 hours of pain medicine  Answer Assessment - Initial Assessment Questions 1. ONSET: When did the pain start?      2 weeks- was seen at ED Sunday 2 weeks ago- negative for clot 2. LOCATION: Where is the pain located?      Top of leg to foot 3. PAIN: How bad is the pain?    (Scale 1-10; or mild, moderate, severe)     9/10 4. WORK OR EXERCISE: Has there been any recent work or exercise that involved this part of the body?      no 5. CAUSE: What do you think is causing the leg pain?     unsure 6. OTHER SYMPTOMS: Do you have any other symptoms? (e.g., chest pain, back pain, breathing difficulty, swelling, rash, fever, numbness, weakness)     no  Protocols used: Leg Pain-A-AH    Copied from CRM #[id number]. Topic: Clinical - Red Word Triage >> Jun 22, 2024 12:27 PM Tobias L wrote: Red Word that prompted transfer to Nurse Triage: sever left leg pain, top of leg to foot

## 2024-06-22 NOTE — Assessment & Plan Note (Signed)
 Symptoms consistent with left sciatica. Start prednisone  taper. Discussed risks of GI bleed with simultaneous Mobic , Ibuprofen. Will hold Mobic  while on Prednisone . Home stretches provided. If continue may continue with PT. Consider lumbar imaging. Follow up PRN

## 2024-07-05 NOTE — Therapy (Unsigned)
 OUTPATIENT PHYSICAL THERAPY LOWER EXTREMITY EVALUATION   Patient Name: Wendy Newman MRN: [id number] DOB:1941-02-17, 83 y.o., female Today's Date: 07/05/2024  END OF SESSION:   Past Medical History:  Diagnosis Date   Anxiety    Cataract    DVT (deep venous thrombosis) (HCC)    Edema of both legs    Essential hypertension    Osteoporosis    PONV (postoperative nausea and vomiting)    Vitamin D  deficiency    Past Surgical History:  Procedure Laterality Date   ABDOMINAL HYSTERECTOMY     Complete   CATARACT EXTRACTION Bilateral    CESAREAN SECTION     X 3   COLONOSCOPY  10/10/2008   MFM:[email or web address]   COLONOSCOPY WITH PROPOFOL  N/A 12/21/2014   Dr. Rourk:noncompliant left colon and redundant right colon, rectal and colonic mucosa appeared normal.    TOTAL KNEE ARTHROPLASTY Right 06/16/2023   Procedure: TOTAL KNEE ARTHROPLASTY;  Surgeon: Margrette Taft BRAVO, MD;  Location: [facility];  Service: Orthopedics;  Laterality: Right;   Patient Active Problem List   Diagnosis Date Noted   Left sided sciatica 06/22/2024   Nausea & vomiting 06/17/2023   Osteoarthritis of right knee 06/16/2023   Acute deep vein thrombosis (DVT) of popliteal vein of left lower extremity (HCC) 10/02/2021   Allergic rhinoconjunctivitis 07/02/2017   Family history of colonic polyps 11/28/2014   Family history of colon cancer 11/28/2014   Constipation 11/28/2014   Vitamin D  deficiency 06/16/2013   Hypertension    Anxiety    Osteoporosis    Cataract    Hyperlipemia 05/18/2009   Anxiety state 05/18/2009   NECK PAIN 05/18/2009   ALLERGIC RHINITIS 08/28/2008   OSTEOARTHRITIS 08/28/2008   POSTMENOPAUSAL OSTEOPOROSIS 08/28/2008   PAT 04/24/2008   OTHER ABNORMAL BLOOD CHEMISTRY 04/24/2008   Essential hypertension 12/16/2007   HEADACHE 12/16/2007    PCP: Duanne Butler DASEN, MD  REFERRING PROVIDER: Kayla Jeoffrey GORMAN, FNP  REFERRING DIAG: [phone] (ICD-10-CM) - Left sided sciatica  THERAPY DIAG:  No  diagnosis found.  Rationale for Evaluation and Treatment: Rehabilitation  ONSET DATE: ***  SUBJECTIVE:   SUBJECTIVE STATEMENT: ***  PERTINENT HISTORY: 1 year post-op on Right PAIN:  Are you having pain? {OPRCPAIN:27236}  PRECAUTIONS: None  RED FLAGS: {PT Red Flags:29287}   WEIGHT BEARING RESTRICTIONS: No  FALLS:  Has patient fallen in last 6 months? {fallsyesno:27318}  LIVING ENVIRONMENT: Lives with: {OPRC lives with:25569::lives with their family} Lives in: {Lives in:25570} Stairs: {opstairs:27293} Has following equipment at home: {Assistive devices:23999}  OCCUPATION: ***  PLOF: {PLOF:24004}  PATIENT GOALS: ***  NEXT MD VISIT: ***  OBJECTIVE:  Note: Objective measures were completed at Evaluation unless otherwise noted.  DIAGNOSTIC FINDINGS: N/A  PATIENT SURVEYS:  LEFS  Extreme difficulty/unable (0), Quite a bit of difficulty (1), Moderate difficulty (2), Little difficulty (3), No difficulty (4) Survey date:    Any of your usual work, housework or school activities   2. Usual hobbies, recreational or sporting activities   3. Getting into/out of the bath   4. Walking between rooms   5. Putting on socks/shoes   6. Squatting    7. Lifting an object, like a bag of groceries from the floor   8. Performing light activities around your home   9. Performing heavy activities around your home   10. Getting into/out of a car   11. Walking 2 blocks   12. Walking 1 mile   13. Going up/down 10 stairs (1 flight)   14. Standing  for 1 hour   15.  sitting for 1 hour   16. Running on even ground   17. Running on uneven ground   18. Making sharp turns while running fast   19. Hopping    20. Rolling over in bed   Score total:  ***     COGNITION: Overall cognitive status: {cognition:24006}     SENSATION: {sensation:27233}  EDEMA:  {edema:24020}  MUSCLE LENGTH: Hamstrings: Right *** deg; Left *** deg Debby test: Right *** deg; Left *** deg  POSTURE:  {posture:25561}  PALPATION: ***  LOWER EXTREMITY ROM:  {AROM/PROM:27142} ROM Right eval Left eval  Hip flexion    Hip extension    Hip abduction    Hip adduction    Hip internal rotation    Hip external rotation    Knee flexion    Knee extension    Ankle dorsiflexion    Ankle plantarflexion    Ankle inversion    Ankle eversion     (Blank rows = not tested)  LOWER EXTREMITY MMT:  MMT Right eval Left eval  Hip flexion    Hip extension    Hip abduction    Hip adduction    Hip internal rotation    Hip external rotation    Knee flexion    Knee extension    Ankle dorsiflexion    Ankle plantarflexion    Ankle inversion    Ankle eversion     (Blank rows = not tested)  LOWER EXTREMITY SPECIAL TESTS:  {LEspecialtests:26242}  FUNCTIONAL TESTS:  30 seconds chair stand test 2 minute walk test: ***  GAIT: Distance walked: *** Assistive device utilized: {Assistive devices:23999} Level of assistance: {Levels of assistance:24026} Comments: ***                                                                                                                                TREATMENT DATE:  07/06/24: PT Eval and HEP    PATIENT EDUCATION:  Education details: PT evaluation, objective findings, POC, Importance of HEP, Precautions, Clinic policies  Person educated: Patient Education method: Explanation and Demonstration Education comprehension: verbalized understanding and returned demonstration  HOME EXERCISE PROGRAM: ***  ASSESSMENT:  CLINICAL IMPRESSION: Patient is a 83 y.o. female who was seen today for physical therapy evaluation and treatment for M54.32 (ICD-10-CM) - Left sided sciatica.   OBJECTIVE IMPAIRMENTS: {opptimpairments:25111}.   ACTIVITY LIMITATIONS: {activitylimitations:27494}  PARTICIPATION LIMITATIONS: {participationrestrictions:25113}  PERSONAL FACTORS: {Personal factors:25162} are also affecting patient's functional outcome.   REHAB  POTENTIAL: {rehabpotential:25112}  CLINICAL DECISION MAKING: {clinical decision making:25114}  EVALUATION COMPLEXITY: {Evaluation complexity:25115}   GOALS: Goals reviewed with patient? Yes  SHORT TERM GOALS: Target date: 07/27/24 Patient will be independent with performance of HEP to demonstrate adequate self management of symptoms.  Baseline:  Goal status: INITIAL  2.   Patient will report at least a 25% improvement with function and/or pain reduction overall since beginning PT. Baseline:  Goal  status: INITIAL   LONG TERM GOALS: Target date: 08/17/24  *** Baseline:  Goal status: INITIAL  2.  *** Baseline:  Goal status: INITIAL  3.  *** Baseline:  Goal status: INITIAL  4.  *** Baseline:  Goal status: INITIAL  5.  *** Baseline:  Goal status: INITIAL  6.  *** Baseline:  Goal status: INITIAL   PLAN:  PT FREQUENCY: 2x/week  PT DURATION: 6 weeks  PLANNED INTERVENTIONS: 97164- PT Re-evaluation, 97110-Therapeutic exercises, 97530- Therapeutic activity, [id number]- Neuromuscular re-education, 97535- Self Care, 02859- Manual therapy, [id number]- Gait training, [id number]- Electrical stimulation (manual), [id number]- Traction (mechanical), 20560 (1-2 muscles), 20561 (3+ muscles)- Dry Needling, Patient/Family education, Balance training, Stair training, Taping, Joint mobilization, Spinal mobilization, Cryotherapy, and Moist heat  PLAN FOR NEXT SESSION: Review HEP and goals,    3:42 PM, 07/05/24 Princes Finger Powell-Butler, PT, DPT [facility]

## 2024-07-06 ENCOUNTER — Other Ambulatory Visit: Payer: Self-pay

## 2024-07-06 ENCOUNTER — Encounter (HOSPITAL_COMMUNITY): Payer: Self-pay

## 2024-07-06 ENCOUNTER — Ambulatory Visit (HOSPITAL_COMMUNITY): Attending: Family Medicine

## 2024-07-06 DIAGNOSIS — M6289 Other specified disorders of muscle: Secondary | ICD-10-CM | POA: Insufficient documentation

## 2024-07-06 DIAGNOSIS — M5459 Other low back pain: Secondary | ICD-10-CM | POA: Insufficient documentation

## 2024-07-06 DIAGNOSIS — M5432 Sciatica, left side: Secondary | ICD-10-CM | POA: Insufficient documentation

## 2024-07-06 DIAGNOSIS — Z7409 Other reduced mobility: Secondary | ICD-10-CM | POA: Insufficient documentation

## 2024-07-12 ENCOUNTER — Other Ambulatory Visit: Payer: Self-pay | Admitting: Family Medicine

## 2024-07-12 DIAGNOSIS — J309 Allergic rhinitis, unspecified: Secondary | ICD-10-CM

## 2024-07-13 ENCOUNTER — Ambulatory Visit (HOSPITAL_COMMUNITY): Admitting: Physical Therapy

## 2024-07-13 DIAGNOSIS — M6289 Other specified disorders of muscle: Secondary | ICD-10-CM

## 2024-07-13 DIAGNOSIS — Z7409 Other reduced mobility: Secondary | ICD-10-CM | POA: Diagnosis not present

## 2024-07-13 DIAGNOSIS — M5432 Sciatica, left side: Secondary | ICD-10-CM | POA: Diagnosis not present

## 2024-07-13 DIAGNOSIS — M5459 Other low back pain: Secondary | ICD-10-CM

## 2024-07-13 NOTE — Therapy (Signed)
 OUTPATIENT PHYSICAL THERAPY LOWER EXTREMITY TREATMENT   Patient Name: Wendy Newman MRN: [id number] DOB:03/27/1941, 83 y.o., female Today's Date: 07/13/2024  END OF SESSION:  PT End of Session - 07/13/24 1244     Visit Number 2    Number of Visits 10    Date for PT Re-Evaluation 08/03/24    Authorization Type [employer] Dual Complete    Authorization Time Period No auth    Progress Note Due on Visit 10    PT Start Time 1100    PT Stop Time 1145    PT Time Calculation (min) 45 min    Activity Tolerance Patient tolerated treatment well    Behavior During Therapy WFL for tasks assessed/performed           Past Medical History:  Diagnosis Date   Anxiety    Cataract    DVT (deep venous thrombosis) (HCC)    Edema of both legs    Essential hypertension    Osteoporosis    PONV (postoperative nausea and vomiting)    Vitamin D  deficiency    Past Surgical History:  Procedure Laterality Date   ABDOMINAL HYSTERECTOMY     Complete   CATARACT EXTRACTION Bilateral    CESAREAN SECTION     X 3   COLONOSCOPY  10/10/2008   MFM:[email or web address]   COLONOSCOPY WITH PROPOFOL  N/A 12/21/2014   Dr. Rourk:noncompliant left colon and redundant right colon, rectal and colonic mucosa appeared normal.    TOTAL KNEE ARTHROPLASTY Right 06/16/2023   Procedure: TOTAL KNEE ARTHROPLASTY;  Surgeon: Margrette Taft BRAVO, MD;  Location: [facility];  Service: Orthopedics;  Laterality: Right;   Patient Active Problem List   Diagnosis Date Noted   Left sided sciatica 06/22/2024   Nausea & vomiting 06/17/2023   Osteoarthritis of right knee 06/16/2023   Acute deep vein thrombosis (DVT) of popliteal vein of left lower extremity (HCC) 10/02/2021   Allergic rhinoconjunctivitis 07/02/2017   Family history of colonic polyps 11/28/2014   Family history of colon cancer 11/28/2014   Constipation 11/28/2014   Vitamin D  deficiency 06/16/2013   Hypertension    Anxiety    Osteoporosis    Cataract    Hyperlipemia 05/18/2009    Anxiety state 05/18/2009   NECK PAIN 05/18/2009   ALLERGIC RHINITIS 08/28/2008   OSTEOARTHRITIS 08/28/2008   POSTMENOPAUSAL OSTEOPOROSIS 08/28/2008   PAT 04/24/2008   OTHER ABNORMAL BLOOD CHEMISTRY 04/24/2008   Essential hypertension 12/16/2007   HEADACHE 12/16/2007    PCP: Duanne Butler DASEN, MD  REFERRING PROVIDER: Kayla Jeoffrey GORMAN, FNP  REFERRING DIAG: [id number] (ICD-10-CM) - Left sided sciatica  THERAPY DIAG:  Other low back pain  Muscle tightness  Impaired functional mobility, balance, gait, and endurance  Rationale for Evaluation and Treatment: Rehabilitation  ONSET DATE: One Month  SUBJECTIVE:   SUBJECTIVE STATEMENT: Pt reports pain of 6/10 in Lower back and LE LE.  States her Rt knee is sore also.   Patient reports R knee still feeling tight since last visit. Got an injection on August 14th. Reports she has been having sciatica for the past month in the LLE. Reports her pain is mostly traveling down the front of her leg but sometimes it travels down the back but ends at her glute muscles. Reports she does have some left sided back pain as well. Reports catching during transfers at times. And some when walking.   PERTINENT HISTORY: 1 year post-op on Right TKA PAIN:  Are you having pain? Yes: NPRS scale: 8/10 Pain  location: Lower left leg, front aspect Pain description: Sharp pain Aggravating factors: N/A; Pain at rest, pain with motion  Relieving factors: Tylenol  helps relieve, sitting with feet propped.   PRECAUTIONS: None  RED FLAGS: None   WEIGHT BEARING RESTRICTIONS: No  FALLS:  Has patient fallen in last 6 months? No  LIVING ENVIRONMENT: Stairs: No Has following equipment at home: Single point cane when outside of house  OCCUPATION: N/A  PLOF: Independent  PATIENT GOALS: For pain to go away  NEXT MD VISIT: Told to call if she needs him Marie)  OBJECTIVE:  Note: Objective measures were completed at Evaluation unless otherwise  noted.  DIAGNOSTIC FINDINGS: N/A  PATIENT SURVEYS:  LEFS  Extreme difficulty/unable (0), Quite a bit of difficulty (1), Moderate difficulty (2), Little difficulty (3), No difficulty (4) Survey date:    07/06/24  Score total:  Lower Extremity Functional Score: 44 / 80 = 55.0 %      COGNITION: Overall cognitive status: Within functional limits for tasks assessed     SENSATION: WFL  POSTURE: rounded shoulders, forward head, and increased thoracic kyphosis  PALPATION: TTP at front of lower Left extremity, L quads, and L hip musculature (IT band area)   LUMBAR ROM:   AROM eval  Flexion To knee, * in L knee  Extension 10% avail, *in L side of low back  Right lateral flexion To knee joint, in R side   Left lateral flexion To knee joint, in L side   Right rotation 25% avail, mid back pain  Left rotation 25% avail    (Blank rows = not tested)   *=pain   LOWER EXTREMITY ROM:  Active ROM Right 07/13/24 Left 07/13/24  Hip flexion 84 degrees hamstring tightness 95 degrees hamstring tightness  Hip extension    Hip abduction    Hip adduction    Hip internal rotation    Hip external rotation    Knee flexion 120   Knee extension -6   Ankle dorsiflexion    Ankle plantarflexion    Ankle inversion    Ankle eversion     (Blank rows = not tested)  LOWER EXTREMITY MMT:  MMT Right eval Left eval  Hip flexion 4 4-  Hip extension 3+ 3-  Hip abduction 4- 4-  Hip adduction    Hip internal rotation    Hip external rotation    Knee flexion 4* 5  Knee extension    Ankle dorsiflexion 4- 4  Ankle plantarflexion 4+ 5  Ankle inversion    Ankle eversion     (Blank rows = not tested)   LUMBAR SPECIAL TESTS:  Straight leg raise test: Negative and Slump test: Negative Pt reports pulling/stretching sensation in leg but no nerve-like symptoms  FUNCTIONAL TESTS:  5 times sit to stand: 24 seconds, catching in low back with first stand 2 minute walk test: 07/13/24:  260 feet with SPC     GAIT: Distance walked: 100 Assistive device utilized: Single point cane Level of assistance: Modified independence Comments: Pt ambulates with SPC, dec knee flex and hip ext bilaterally (L>R), dec weight shift to L, dec step length on R  TREATMENT DATE:  07/13/24 Supine:  lower trunk rotations 10X5  Bridge 10X  SLR 10X each LE  Rt knee measurements -6 to 120 Seated:  toeraises 10X  Piriformis stretch Lt 2X30  Hamstring stretch 30 each side  MMT for hip ext/abd, quad and hamstrings 2 MWT 260 feet with SPC   07/06/24: PT Eval and HEP    PATIENT EDUCATION:  Education details: PT evaluation, objective findings, POC, Importance of HEP, Precautions, Clinic policies  Person educated: Patient Education method: Explanation and Demonstration Education comprehension: verbalized understanding and returned demonstration  HOME EXERCISE PROGRAM: Access Code: [email or web address] URL: [email or web address] Date: 07/06/2024 Prepared by: Rosaria Powell-Butler Exercises - Supine Lower Trunk Rotation  - 2 x daily - 7 x weekly - 3 sets - 10 reps - Hooklying Hamstring Stretch with Strap  - 2 x daily - 7 x weekly - 3 sets - 10 reps - 30 hold - Prone Quadriceps Stretch with Strap  - 2 x daily - 7 x weekly - 3 sets - 10 reps - 30 hold - Seated Heel Toe Raises  - 2 x daily - 7 x weekly - 3 sets - 10 reps  Access Code: [email or web address] URL: [email or web address] Date: 07/13/2024 Prepared by: Greig Fuse Exercises - Seated Figure 4 Piriformis Stretch  - 2 x daily - 7 x weekly - 2 sets - 3 reps - 30 sec hold - Seated Table Hamstring Stretch  - 2 x daily - 7 x weekly - 2 sets - 3 reps - 30 sec hold - Supine Bridge  - 2 x daily - 7 x weekly - 2 sets - 10 reps - Active Straight Leg Raise with Quad Set  - 2 x daily - 7 x weekly - 2 sets - 10  reps  ASSESSMENT:  CLINICAL IMPRESSION: Began session with HEP review and given printout from evaluation with added therex from today.  Progressed with seated piriformis stretch today as well as glut and hip flexor strengthening.  MMT completed for hip extensors and hamstrings with noted weakness.  ROM taken for Rt knee at -6 to 120, which was not as good as discharge last episode at -2 to 125.  Instructed to continue doing stretches for Rt knee as well as quad sets.  Hamstrings measured with extreme tightness, especially in Rt hamstring.  Pt instructed with alternative way of completing hamstring stretch in seated as somewhat straining supine using towel.  2 minute walk test completed at end of session today at 260 feet using SPC.  Instructed to try cane on Rt side now that Lt side is more painful/weak and reported felt better.  Patient will benefit from continued skilled physical therapy in order to address current deficits to improve current level of function for improved quality of life.    OBJECTIVE IMPAIRMENTS: Abnormal gait, decreased activity tolerance, decreased endurance, decreased mobility, difficulty walking, decreased ROM, decreased strength, hypomobility, improper body mechanics, postural dysfunction, and pain.   ACTIVITY LIMITATIONS: carrying, lifting, bending, sitting, standing, squatting, and transfers  PARTICIPATION LIMITATIONS: meal prep, cleaning, laundry, community activity, and yard work  PERSONAL FACTORS: N/A are also affecting patient's functional outcome.   REHAB POTENTIAL: Good  CLINICAL DECISION MAKING: Stable/uncomplicated  EVALUATION COMPLEXITY: Low   GOALS: Goals reviewed with patient? Yes  SHORT TERM GOALS: Target date: 07/27/24 Patient will be independent with performance of HEP to demonstrate adequate self management of symptoms.  Baseline:  Goal status: INITIAL  2.   Patient will report at least a 25% improvement  with function and/or pain reduction  overall since beginning PT. Baseline:  Goal status: INITIAL   LONG TERM GOALS: Target date: 08/31/24 Patient will improve LEFS score by 9 points to demonstrate improved perceived function while meeting MCID.  Baseline: Goal status: INITIAL 2.  Patient will improve  lumbar  flexion ROM to reach past knee joint with reports of pain as 5/10 or lower in L knee to demonstrate improved lumbar mobility needed for functional transfers and house chores.  Baseline:  Goal status: INITIAL 3.  Patient will score at least a  4+/5 on all LLE MMT to demonstrate increased LE strength and/or power needed to improve ambulation/gait mechanics.  Baseline:  Goal status: INITIAL   4.  Patient will increase 2 minute walk test gait distance to 40 ft with least restrictive assistive device to demonstrate improved endurance and functional mobility needed for ambulating household and community distances.  Baseline: Goal status: INITIAL; 5. Patient will improve 5 times sit to stand test by at least 3 seconds in order to demonstrate improved LE strength/power needed for functional transfers.  Baseline: Goal status: INITIAL;    PLAN:  PT FREQUENCY: 1x/week  PT DURATION: 8 weeks  PLANNED INTERVENTIONS: 97164- PT Re-evaluation, 97110-Therapeutic exercises, 97530- Therapeutic activity, 97112- Neuromuscular re-education, 97535- Self Care, 02859- Manual therapy, [phone]- Gait training, [phone]- Electrical stimulation (manual), [id number]- Traction (mechanical), [phone] (1-2 muscles), 20561 (3+ muscles)- Dry Needling, Patient/Family education, Balance training, Stair training, Taping, Joint mobilization, Spinal mobilization, Cryotherapy, and Moist heat  PLAN FOR NEXT SESSION: Continue with lumbar mobility, LE mobility and strengthening   12:44 PM, 07/13/24\ Greig KATHEE Fuse, PTA/CLT [facility] Ph: [phone]

## 2024-07-14 ENCOUNTER — Other Ambulatory Visit: Payer: Self-pay

## 2024-07-14 ENCOUNTER — Telehealth: Payer: Self-pay | Admitting: Family Medicine

## 2024-07-14 DIAGNOSIS — I10 Essential (primary) hypertension: Secondary | ICD-10-CM

## 2024-07-14 MED ORDER — HYDROCHLOROTHIAZIDE 25 MG PO TABS: 25.0000 mg | ORAL_TABLET | Freq: Every day | ORAL | 1 refills | Status: DC

## 2024-07-14 NOTE — Telephone Encounter (Signed)
 Prescription Request  07/14/2024  LOV: 01/25/2024  What is the name of the medication or equipment?   hydrochlorothiazide  (HYDRODIURIL ) 25 MG tablet  **90 day script request**  Have you contacted your pharmacy to request a refill? Yes   Which pharmacy would you like this sent to?  [facility] [place], [place] - [address] [facility] [address] [place] [address] Phone: [phone] Fax: [phone]    Patient notified that their request is being sent to the clinical staff for review and that they should receive a response within 2 business days.   Please advise pharmacist.

## 2024-07-14 NOTE — Telephone Encounter (Signed)
 Medication sent in.

## 2024-07-19 ENCOUNTER — Ambulatory Visit: Payer: Self-pay

## 2024-07-19 NOTE — Telephone Encounter (Signed)
 FYI Only or Action Required?: Action required by provider: clinical question for provider and update on patient condition.  Patient was last seen in primary care on 06/22/2024 by Wendy Jeoffrey RAMAN, FNP.  Called Nurse Triage reporting Back Pain and Leg Pain.  Symptoms began chronic.  Interventions attempted: OTC medications: tylenol  and Prescription medications: meloxicam , prednisone .  Symptoms are: gradually worsening.  Triage Disposition: See PCP When Office is Open (Within 3 Days)  Patient/caregiver understands and will follow disposition?: with modificaitons  Copied from CRM #[id number]. Topic: Clinical - Red Word Triage >> Jul 19, 2024 10:37 AM Wendy Newman wrote: Red Word that prompted transfer to Nurse Triage: Patient has sciatica nerve pain is hurting badly in her lower back and left leg. Tylenol  is not working Reason for Disposition  [1] MODERATE pain (e.g., interferes with normal activities, limping) AND [2] present > 3 days  Answer Assessment - Initial Assessment Questions States that she has had a round of prednisone  at the end of August that was not helpful, and that she has also taken tylenol  and that this does not help anymore.   She would like for something to be called in and would prefer that it not be narcotic. Patient already taking meloxicam   Patient has transportation issues and will consider appointment, but would like to send this request first  1. ONSET: When did the pain start?      For a long time, hasn't quit 2. LOCATION: Where is the pain located?      Left leg pain 3. PAIN: How bad is the pain?    (Scale 1-10; or mild, moderate, severe)     At time of call, moderate pain, able to do laundry 4. WORK OR EXERCISE: Has there been any recent work or exercise that involved this part of the body?      laundry 5. CAUSE: What do you think is causing the leg pain?     Sciatic pain 6. OTHER SYMPTOMS: Do you have any other symptoms? (e.g., chest pain,  back pain, breathing difficulty, swelling, rash, fever, numbness, weakness)     denies 7. PREGNANCY: Is there any chance you are pregnant? When was your last menstrual period?     N/a  Protocols used: Leg Pain-A-AH

## 2024-07-21 ENCOUNTER — Ambulatory Visit: Admitting: Family Medicine

## 2024-07-26 ENCOUNTER — Encounter: Payer: Self-pay | Admitting: Family Medicine

## 2024-07-26 ENCOUNTER — Ambulatory Visit: Admitting: Family Medicine

## 2024-07-26 ENCOUNTER — Ambulatory Visit (HOSPITAL_COMMUNITY): Admitting: Physical Therapy

## 2024-07-26 VITALS — BP 156/82 | HR 60 | Temp 97.8°F | Ht 66.0 in | Wt 170.4 lb

## 2024-07-26 DIAGNOSIS — M6289 Other specified disorders of muscle: Secondary | ICD-10-CM | POA: Diagnosis not present

## 2024-07-26 DIAGNOSIS — M5459 Other low back pain: Secondary | ICD-10-CM

## 2024-07-26 DIAGNOSIS — Z7409 Other reduced mobility: Secondary | ICD-10-CM | POA: Diagnosis not present

## 2024-07-26 DIAGNOSIS — M5432 Sciatica, left side: Secondary | ICD-10-CM | POA: Diagnosis not present

## 2024-07-26 MED ORDER — PREDNISONE 20 MG PO TABS: ORAL_TABLET | ORAL | 0 refills | Status: AC

## 2024-07-26 NOTE — Therapy (Signed)
 OUTPATIENT PHYSICAL THERAPY LOWER EXTREMITY TREATMENT   Patient Name: Wendy Newman MRN: [id number] DOB:01-19-1941, 83 y.o., female Today's Date: 07/26/2024  END OF SESSION:  PT End of Session - 07/26/24 1150     Visit Number 3    Number of Visits 10    Date for Recertification  08/03/24    Authorization Type [employer] Dual Complete    Authorization Time Period No auth    Progress Note Due on Visit 10    PT Start Time 1117    PT Stop Time 1155    PT Time Calculation (min) 38 min    Activity Tolerance Patient tolerated treatment well    Behavior During Therapy WFL for tasks assessed/performed            Past Medical History:  Diagnosis Date   Anxiety    Cataract    DVT (deep venous thrombosis) (HCC)    Edema of both legs    Essential hypertension    Osteoporosis    PONV (postoperative nausea and vomiting)    Vitamin D  deficiency    Past Surgical History:  Procedure Laterality Date   ABDOMINAL HYSTERECTOMY     Complete   CATARACT EXTRACTION Bilateral    CESAREAN SECTION     X 3   COLONOSCOPY  10/10/2008   MFM:[email or web address]   COLONOSCOPY WITH PROPOFOL  N/A 12/21/2014   Dr. Rourk:noncompliant left colon and redundant right colon, rectal and colonic mucosa appeared normal.    TOTAL KNEE ARTHROPLASTY Right 06/16/2023   Procedure: TOTAL KNEE ARTHROPLASTY;  Surgeon: Margrette Taft BRAVO, MD;  Location: [facility];  Service: Orthopedics;  Laterality: Right;   Patient Active Problem List   Diagnosis Date Noted   Left sided sciatica 06/22/2024   Nausea & vomiting 06/17/2023   Osteoarthritis of right knee 06/16/2023   Acute deep vein thrombosis (DVT) of popliteal vein of left lower extremity (HCC) 10/02/2021   Allergic rhinoconjunctivitis 07/02/2017   Family history of colonic polyps 11/28/2014   Family history of colon cancer 11/28/2014   Constipation 11/28/2014   Vitamin D  deficiency 06/16/2013   Hypertension    Anxiety    Osteoporosis    Cataract    Hyperlipemia  05/18/2009   Anxiety state 05/18/2009   NECK PAIN 05/18/2009   ALLERGIC RHINITIS 08/28/2008   OSTEOARTHRITIS 08/28/2008   POSTMENOPAUSAL OSTEOPOROSIS 08/28/2008   PAT 04/24/2008   OTHER ABNORMAL BLOOD CHEMISTRY 04/24/2008   Essential hypertension 12/16/2007   HEADACHE 12/16/2007    PCP: Duanne Butler DASEN, MD  REFERRING PROVIDER: Kayla Jeoffrey GORMAN, FNP  REFERRING DIAG: [phone] (ICD-10-CM) - Left sided sciatica  THERAPY DIAG:  Other low back pain  Muscle tightness  Impaired functional mobility, balance, gait, and endurance  Rationale for Evaluation and Treatment: Rehabilitation  ONSET DATE: One Month  SUBJECTIVE:   SUBJECTIVE STATEMENT: Pt reports pain still 6/10 in Lower back and Lt LE. Pt did a few exercises, the ones she could remember as she left her folder here at the therapy clinic by mistake. Using her cane on her Rt side and is helping more.   Patient reports R knee still feeling tight since last visit. Got an injection on August 14th. Reports she has been having sciatica for the past month in the LLE. Reports her pain is mostly traveling down the front of her leg but sometimes it travels down the back but ends at her glute muscles. Reports she does have some left sided back pain as well. Reports catching during transfers at  times. And some when walking.   PERTINENT HISTORY: 1 year post-op on Right TKA PAIN:  Are you having pain? Yes: NPRS scale: 8/10 Pain location: Lower left leg, front aspect Pain description: Sharp pain Aggravating factors: N/A; Pain at rest, pain with motion  Relieving factors: Tylenol  helps relieve, sitting with feet propped.   PRECAUTIONS: None  RED FLAGS: None   WEIGHT BEARING RESTRICTIONS: No  FALLS:  Has patient fallen in last 6 months? No  LIVING ENVIRONMENT: Stairs: No Has following equipment at home: Single point cane when outside of house  OCCUPATION: N/A  PLOF: Independent  PATIENT GOALS: For pain to go away  NEXT  MD VISIT: Told to call if she needs him Marie)  OBJECTIVE:  Note: Objective measures were completed at Evaluation unless otherwise noted.  DIAGNOSTIC FINDINGS: N/A  PATIENT SURVEYS:  LEFS  Extreme difficulty/unable (0), Quite a bit of difficulty (1), Moderate difficulty (2), Little difficulty (3), No difficulty (4) Survey date:    07/06/24  Score total:  Lower Extremity Functional Score: 44 / 80 = 55.0 %      COGNITION: Overall cognitive status: Within functional limits for tasks assessed     SENSATION: WFL  POSTURE: rounded shoulders, forward head, and increased thoracic kyphosis  PALPATION: TTP at front of lower Left extremity, L quads, and L hip musculature (IT band area)   LUMBAR ROM:   AROM eval  Flexion To knee, * in L knee  Extension 10% avail, *in L side of low back  Right lateral flexion To knee joint, in R side   Left lateral flexion To knee joint, in L side   Right rotation 25% avail, mid back pain  Left rotation 25% avail    (Blank rows = not tested)   *=pain   LOWER EXTREMITY ROM:  Active ROM Right 07/13/24 Left 07/13/24  Hip flexion 84 degrees hamstring tightness 95 degrees hamstring tightness  Hip extension    Hip abduction    Hip adduction    Hip internal rotation    Hip external rotation    Knee flexion 120   Knee extension -6   Ankle dorsiflexion    Ankle plantarflexion    Ankle inversion    Ankle eversion     (Blank rows = not tested)  LOWER EXTREMITY MMT:  MMT Right eval Left eval  Hip flexion 4 4-  Hip extension 3+ 3-  Hip abduction 4- 4-  Hip adduction    Hip internal rotation    Hip external rotation    Knee flexion 4* 5  Knee extension    Ankle dorsiflexion 4- 4  Ankle plantarflexion 4+ 5  Ankle inversion    Ankle eversion     (Blank rows = not tested)   LUMBAR SPECIAL TESTS:  Straight leg raise test: Negative and Slump test: Negative Pt reports pulling/stretching sensation in leg but no nerve-like  symptoms  FUNCTIONAL TESTS:  5 times sit to stand: 24 seconds, catching in low back with first stand 2 minute walk test: 07/13/24:  260 feet with SPC    GAIT: Distance walked: 100 Assistive device utilized: Single point cane Level of assistance: Modified independence Comments: Pt ambulates with SPC, dec knee flex and hip ext bilaterally (L>R), dec weight shift to L, dec step length on R  TREATMENT DATE:  07/26/24 Standing:  hip excursions 10X each direction  Hip extension  Hip abduction  Heelraises 20X  Toeraises 20X Supine:  lower trunk rotations 10X5  Bridge 10X  SLR 10X each LE  Lower trunk rotations 10X  Hamstring stretch with rope 30X2 each Seated: Piriformis stretch bil 2X30  07/13/24 Supine:  lower trunk rotations 10X5  Bridge 10X  SLR 10X each LE  Rt knee measurements -6 to 120 Seated:  toeraises 10X  Piriformis stretch Lt 2X30  Hamstring stretch 30 each side  MMT for hip ext/abd, quad and hamstrings 2 MWT 260 feet with SPC   07/06/24: PT Eval and HEP    PATIENT EDUCATION:  Education details: PT evaluation, objective findings, POC, Importance of HEP, Precautions, Clinic policies  Person educated: Patient Education method: Explanation and Demonstration Education comprehension: verbalized understanding and returned demonstration  HOME EXERCISE PROGRAM: Access Code: [email or web address] URL: [email or web address] Date: 07/06/2024 Prepared by: Rosaria Powell-Butler Exercises - Supine Lower Trunk Rotation  - 2 x daily - 7 x weekly - 3 sets - 10 reps - Hooklying Hamstring Stretch with Strap  - 2 x daily - 7 x weekly - 3 sets - 10 reps - 30 hold - Prone Quadriceps Stretch with Strap  - 2 x daily - 7 x weekly - 3 sets - 10 reps - 30 hold - Seated Heel Toe Raises  - 2 x daily - 7 x weekly - 3 sets - 10 reps  Access Code:  [email or web address] URL: [email or web address] Date: 07/13/2024 Prepared by: Greig Fuse Exercises - Seated Figure 4 Piriformis Stretch  - 2 x daily - 7 x weekly - 2 sets - 3 reps - 30 sec hold - Seated Table Hamstring Stretch  - 2 x daily - 7 x weekly - 2 sets - 3 reps - 30 sec hold - Supine Bridge  - 2 x daily - 7 x weekly - 2 sets - 10 reps - Active Straight Leg Raise with Quad Set  - 2 x daily - 7 x weekly - 2 sets - 10 reps  07/26/24: hip excursions/lumbar ROM 10X each direction  ASSESSMENT:  CLINICAL IMPRESSION: Began session with HEP review again as she had forgotten her instructions last session.  Progressed with standing exercises including lumbar/hip ROM and LE strengthening. Pt did present with poor activity tolerance having to take a seated rest after completing 2 activities in standing.  Less tightness noted with Rt piriformis stretch today.  No pain or issues noted during session today other than some cramping in Rt quad completing Lt SLR due to weakness. Updated HEP to include hip excursions this session.   Patient will benefit from continued skilled physical therapy in order to address current deficits to improve current level of function for improved quality of life.    OBJECTIVE IMPAIRMENTS: Abnormal gait, decreased activity tolerance, decreased endurance, decreased mobility, difficulty walking, decreased ROM, decreased strength, hypomobility, improper body mechanics, postural dysfunction, and pain.   ACTIVITY LIMITATIONS: carrying, lifting, bending, sitting, standing, squatting, and transfers  PARTICIPATION LIMITATIONS: meal prep, cleaning, laundry, community activity, and yard work  PERSONAL FACTORS: N/A are also affecting patient's functional outcome.   REHAB POTENTIAL: Good  CLINICAL DECISION MAKING: Stable/uncomplicated  EVALUATION COMPLEXITY: Low   GOALS: Goals reviewed with patient? Yes  SHORT TERM GOALS: Target date: 07/27/24 Patient will be independent  with performance of HEP to demonstrate adequate self management of symptoms.  Baseline:  Goal status: INITIAL  2.   Patient will report at  least a 25% improvement with function and/or pain reduction overall since beginning PT. Baseline:  Goal status: INITIAL   LONG TERM GOALS: Target date: 08/31/24 Patient will improve LEFS score by 9 points to demonstrate improved perceived function while meeting MCID.  Baseline: Goal status: INITIAL 2.  Patient will improve  lumbar  flexion ROM to reach past knee joint with reports of pain as 5/10 or lower in L knee to demonstrate improved lumbar mobility needed for functional transfers and house chores.  Baseline:  Goal status: INITIAL 3.  Patient will score at least a  4+/5 on all LLE MMT to demonstrate increased LE strength and/or power needed to improve ambulation/gait mechanics.  Baseline:  Goal status: INITIAL   4.  Patient will increase 2 minute walk test gait distance to 40 ft with least restrictive assistive device to demonstrate improved endurance and functional mobility needed for ambulating household and community distances.  Baseline: Goal status: INITIAL; 5. Patient will improve 5 times sit to stand test by at least 3 seconds in order to demonstrate improved LE strength/power needed for functional transfers.  Baseline: Goal status: INITIAL;    PLAN:  PT FREQUENCY: 1x/week  PT DURATION: 8 weeks  PLANNED INTERVENTIONS: 97164- PT Re-evaluation, 97110-Therapeutic exercises, 97530- Therapeutic activity, 97112- Neuromuscular re-education, 97535- Self Care, 02859- Manual therapy, [phone]- Gait training, [phone]- Electrical stimulation (manual), [id number]- Traction (mechanical), [phone] (1-2 muscles), 20561 (3+ muscles)- Dry Needling, Patient/Family education, Balance training, Stair training, Taping, Joint mobilization, Spinal mobilization, Cryotherapy, and Moist heat  PLAN FOR NEXT SESSION: Continue with lumbar mobility, LE mobility and  strengthening   11:51 AM, 07/26/24\ Greig KATHEE Fuse, PTA/CLT [facility] Ph: [phone]

## 2024-07-26 NOTE — Progress Notes (Signed)
 Subjective:    Patient ID: Wendy Newman, female    DOB: 04-12-1941, 83 y.o.   MRN: [id number]  Back Pain    Patient went to the emergency room August 3 and 4 with left leg pain.  DVT was ruled out.  Saw my partner on August 27 and was diagnosed with sciatica.  Was given prednisone .  Continues to have low back pain radiating into her left gluteus.  The pain radiates down her left leg.  The pain radiates all the way into her left calf.  The pain is intense and constantly present.  It is worse with standing.  She denies any bowel or bladder incontinence.  She denies any saddle anesthesia. Past Medical History:  Diagnosis Date   Anxiety    Cataract    DVT (deep venous thrombosis) (HCC)    Edema of both legs    Essential hypertension    Osteoporosis    PONV (postoperative nausea and vomiting)    Vitamin D  deficiency    Past Surgical History:  Procedure Laterality Date   ABDOMINAL HYSTERECTOMY     Complete   CATARACT EXTRACTION Bilateral    CESAREAN SECTION     X 3   COLONOSCOPY  10/10/2008   MFM:[email or web address]   COLONOSCOPY WITH PROPOFOL  N/A 12/21/2014   Dr. Rourk:noncompliant left colon and redundant right colon, rectal and colonic mucosa appeared normal.    TOTAL KNEE ARTHROPLASTY Right 06/16/2023   Procedure: TOTAL KNEE ARTHROPLASTY;  Surgeon: Margrette Taft BRAVO, MD;  Location: [facility];  Service: Orthopedics;  Laterality: Right;   Current Outpatient Medications on File Prior to Visit  Medication Sig Dispense Refill   atorvastatin  (LIPITOR) 20 MG tablet TAKE 1 TABLET BY MOUTH ONCE  DAILY 100 tablet 2   bisacodyl  (DULCOLAX) 5 MG EC tablet Take 1 tablet (5 mg total) by mouth daily as needed for moderate constipation. 90 each 0   CALCIUM -MAGNESIUM -ZINC -VIT D3 PO Take 1 tablet by mouth daily.     citalopram  (CELEXA ) 20 MG tablet TAKE 1 TABLET BY MOUTH DAILY 90 tablet 0   fluticasone  (FLONASE ) 50 MCG/ACT nasal spray Place 2 sprays into both nostrils daily. 48 g 2    hydrochlorothiazide  (HYDRODIURIL ) 25 MG tablet Take 1 tablet (25 mg total) by mouth daily. 90 tablet 1   levocetirizine (XYZAL ) 5 MG tablet TAKE 1 TABLET BY MOUTH DAILY IN  THE EVENING 100 tablet 1   meloxicam  (MOBIC ) 15 MG tablet TAKE 1 TABLET BY MOUTH DAILY 100 tablet 1   naproxen  (NAPROSYN ) 500 MG tablet TAKE 1 TABLET BY MOUTH TWICE  DAILY WITH A MEAL 180 tablet 3   polyethylene glycol (MIRALAX ) 17 g packet Take 17 g by mouth daily. 14 each 0   predniSONE  (DELTASONE ) 20 MG tablet 3 tabs poqday 1-2, 2 tabs poqday 3-4, 1 tab poqday 5-6, hold meloxicam  while taking (Patient not taking: Reported on 07/26/2024) 12 tablet 0   No current facility-administered medications on file prior to visit.   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  Tobacco Use   Smoking status: Never   Smokeless tobacco: Never  Vaping Use   Vaping status: Never Used  Substance and Sexual Activity   Alcohol use: No  Alcohol/week: 0.0 standard drinks of alcohol   Drug use: No   Sexual activity: Yes    Birth control/protection: Surgical  Other Topics Concern   Not on file  Social History Narrative   Lives alone    Does not drive relies on grandson or neighbor for transportation    Social Drivers of Health   Financial Resource Strain: Low Risk  (10/08/2023)   Overall Financial Resource Strain (CARDIA)    Difficulty of Paying Living Expenses: Not hard at all  Food Insecurity: No Food Insecurity (10/08/2023)   Hunger Vital Sign    Worried About Running Out of Food in the Last Year: Never true    Ran Out of Food in the Last Year: Never true  Transportation Needs: No Transportation Needs (10/08/2023)   PRAPARE - Administrator, Civil Service (Medical): No    Lack of  Transportation (Non-Medical): No  Physical Activity: Inactive (10/08/2023)   Exercise Vital Sign    Days of Exercise per Week: 0 days    Minutes of Exercise per Session: 0 min  Stress: No Stress Concern Present (10/08/2023)   [employer] of Occupational Health - Occupational Stress Questionnaire    Feeling of Stress : Not at all  Social Connections: Moderately Isolated (10/08/2023)   Social Connection and Isolation Panel    Frequency of Communication with Friends and Family: More than three times a week    Frequency of Social Gatherings with Friends and Family: Three times a week    Attends Religious Services: More than 4 times per year    Active Member of Clubs or Organizations: No    Attends Banker Meetings: Never    Marital Status: Widowed  Intimate Partner Violence: Not At Risk (10/08/2023)   Humiliation, Afraid, Rape, and Kick questionnaire    Fear of Current or Ex-Partner: No    Emotionally Abused: No    Physically Abused: No    Sexually Abused: No    Review of Systems  Musculoskeletal:  Positive for back pain.  All other systems reviewed and are negative.      Objective:   Physical Exam Vitals reviewed.  Constitutional:      Appearance: Normal appearance. She is normal weight.  Cardiovascular:     Rate and Rhythm: Normal rate and regular rhythm.     Heart sounds: Normal heart sounds.  Musculoskeletal:     Lumbar back: Tenderness and bony tenderness present. No swelling, deformity or spasms. Decreased range of motion. Positive left straight leg raise test.     Right lower leg: No edema.     Left lower leg: No edema.  Neurological:     Mental Status: She is alert.           Assessment & Plan:   Left sided sciatica - Plan: MR Lumbar Spine Wo Contrast Symptoms are consistent with left-sided sciatica due to nerve impingement lumbar spine.  Symptoms been present now for more than 6 weeks and have prompted 2 separate emergency room visits.   Patient has tried and failed prednisone .  Will try prednisone  1 additional time and proceed with an MRI of the lumbar spine to evaluate further

## 2024-08-02 ENCOUNTER — Ambulatory Visit (HOSPITAL_COMMUNITY): Attending: Family Medicine | Admitting: Physical Therapy

## 2024-08-02 DIAGNOSIS — M6289 Other specified disorders of muscle: Secondary | ICD-10-CM | POA: Insufficient documentation

## 2024-08-02 DIAGNOSIS — M5459 Other low back pain: Secondary | ICD-10-CM | POA: Insufficient documentation

## 2024-08-02 DIAGNOSIS — Z7409 Other reduced mobility: Secondary | ICD-10-CM | POA: Diagnosis present

## 2024-08-02 NOTE — Therapy (Signed)
 OUTPATIENT PHYSICAL THERAPY LOWER EXTREMITY TREATMENT   Patient Name: Wendy Newman MRN: [id number] DOB:01-19-1941, 83 y.o., female Today's Date: 08/02/2024  END OF SESSION:  PT End of Session - 08/02/24 1115     Visit Number 4    Number of Visits 10    Date for Recertification  08/03/24    Authorization Type [employer] Dual Complete    Authorization Time Period No auth    Progress Note Due on Visit 10    PT Start Time 1115    PT Stop Time 1155    PT Time Calculation (min) 40 min    Activity Tolerance Patient tolerated treatment well    Behavior During Therapy WFL for tasks assessed/performed            Past Medical History:  Diagnosis Date   Anxiety    Cataract    DVT (deep venous thrombosis) (HCC)    Edema of both legs    Essential hypertension    Osteoporosis    PONV (postoperative nausea and vomiting)    Vitamin D  deficiency    Past Surgical History:  Procedure Laterality Date   ABDOMINAL HYSTERECTOMY     Complete   CATARACT EXTRACTION Bilateral    CESAREAN SECTION     X 3   COLONOSCOPY  10/10/2008   MFM:[email or web address]   COLONOSCOPY WITH PROPOFOL  N/A 12/21/2014   Dr. Rourk:noncompliant left colon and redundant right colon, rectal and colonic mucosa appeared normal.    TOTAL KNEE ARTHROPLASTY Right 06/16/2023   Procedure: TOTAL KNEE ARTHROPLASTY;  Surgeon: Margrette Taft BRAVO, MD;  Location: [facility];  Service: Orthopedics;  Laterality: Right;   Patient Active Problem List   Diagnosis Date Noted   Left sided sciatica 06/22/2024   Nausea & vomiting 06/17/2023   Osteoarthritis of right knee 06/16/2023   Acute deep vein thrombosis (DVT) of popliteal vein of left lower extremity (HCC) 10/02/2021   Allergic rhinoconjunctivitis 07/02/2017   Family history of colonic polyps 11/28/2014   Family history of colon cancer 11/28/2014   Constipation 11/28/2014   Vitamin D  deficiency 06/16/2013   Hypertension    Anxiety    Osteoporosis    Cataract    Hyperlipemia  05/18/2009   Anxiety state 05/18/2009   NECK PAIN 05/18/2009   ALLERGIC RHINITIS 08/28/2008   OSTEOARTHRITIS 08/28/2008   POSTMENOPAUSAL OSTEOPOROSIS 08/28/2008   PAT 04/24/2008   OTHER ABNORMAL BLOOD CHEMISTRY 04/24/2008   Essential hypertension 12/16/2007   HEADACHE 12/16/2007    PCP: Duanne Butler DASEN, MD  REFERRING PROVIDER: Kayla Jeoffrey GORMAN, FNP  REFERRING DIAG: [phone] (ICD-10-CM) - Left sided sciatica  THERAPY DIAG:  Other low back pain  Muscle tightness  Impaired functional mobility, balance, gait, and endurance  Rationale for Evaluation and Treatment: Rehabilitation  ONSET DATE: One Month  SUBJECTIVE:   SUBJECTIVE STATEMENT: Pt reports increased pain in central LB today to 6/10 that comes and goes. Reports compliance with HEP.  Continues to ambulate with SPC.  Went back to MD last week and put her back on prednisone  and reports has not helped at all.  Scheduling an MRI now on her back.  Patient reports R knee still feeling tight since last visit. Got an injection on August 14th. Reports she has been having sciatica for the past month in the LLE. Reports her pain is mostly traveling down the front of her leg but sometimes it travels down the back but ends at her glute muscles. Reports she does have some left sided back pain  as well. Reports catching during transfers at times. And some when walking.   PERTINENT HISTORY: 1 year post-op on Right TKA PAIN:  Are you having pain? Yes: NPRS scale: 6/10 Pain location: central lower back Pain description: ache Aggravating factors: N/A; Pain at rest, pain with motion  Relieving factors: Tylenol  helps relieve, sitting with feet propped.   PRECAUTIONS: None  RED FLAGS: None   WEIGHT BEARING RESTRICTIONS: No  FALLS:  Has patient fallen in last 6 months? No  LIVING ENVIRONMENT: Stairs: No Has following equipment at home: Single point cane when outside of house  OCCUPATION: N/A  PLOF: Independent  PATIENT  GOALS: For pain to go away  NEXT MD VISIT: Told to call if she needs him Marie)  OBJECTIVE:  Note: Objective measures were completed at Evaluation unless otherwise noted.  DIAGNOSTIC FINDINGS: N/A  PATIENT SURVEYS:  LEFS  Extreme difficulty/unable (0), Quite a bit of difficulty (1), Moderate difficulty (2), Little difficulty (3), No difficulty (4) Survey date:    07/06/24  Score total:  Lower Extremity Functional Score: 44 / 80 = 55.0 %      COGNITION: Overall cognitive status: Within functional limits for tasks assessed     SENSATION: WFL  POSTURE: rounded shoulders, forward head, and increased thoracic kyphosis  PALPATION: TTP at front of lower Left extremity, L quads, and L hip musculature (IT band area)   LUMBAR ROM:   AROM eval  Flexion To knee, * in L knee  Extension 10% avail, *in L side of low back  Right lateral flexion To knee joint, in R side   Left lateral flexion To knee joint, in L side   Right rotation 25% avail, mid back pain  Left rotation 25% avail    (Blank rows = not tested)   *=pain   LOWER EXTREMITY ROM:  Active ROM Right 07/13/24 Left 07/13/24  Hip flexion 84 degrees hamstring tightness 95 degrees hamstring tightness  Hip extension    Hip abduction    Hip adduction    Hip internal rotation    Hip external rotation    Knee flexion 120   Knee extension -6   Ankle dorsiflexion    Ankle plantarflexion    Ankle inversion    Ankle eversion     (Blank rows = not tested)  LOWER EXTREMITY MMT:  MMT Right eval Left eval  Hip flexion 4 4-  Hip extension 3+ 3-  Hip abduction 4- 4-  Hip adduction    Hip internal rotation    Hip external rotation    Knee flexion 4* 5  Knee extension    Ankle dorsiflexion 4- 4  Ankle plantarflexion 4+ 5  Ankle inversion    Ankle eversion     (Blank rows = not tested)   LUMBAR SPECIAL TESTS:  Straight leg raise test: Negative and Slump test: Negative Pt reports pulling/stretching sensation  in leg but no nerve-like symptoms  FUNCTIONAL TESTS:  5 times sit to stand: 24 seconds, catching in low back with first stand 2 minute walk test: 07/13/24:  260 feet with SPC    GAIT: Distance walked: 100 Assistive device utilized: Single point cane Level of assistance: Modified independence Comments: Pt ambulates with SPC, dec knee flex and hip ext bilaterally (L>R), dec weight shift to L, dec step length on R  TREATMENT DATE:  08/02/24 Standing:  hip excursions 10X each direction  Hip extension 2X10 each  Hip abduction 2X10 each  Heelraises 20X on incline  Forward lunges onto 4 step 2X10  Standing extensions back against bar 10X  Squats with chair behind for biofeedback 10X Lateral stepping along 98ft line with RTB at thighs 2RT Nustep seat 7 UE/LE atl beach level 3, 5 minutes  07/26/24 Standing:  hip excursions 10X each direction  Hip extension  Hip abduction  Heelraises 20X  Toeraises 20X Supine:  lower trunk rotations 10X5  Bridge 10X  SLR 10X each LE  Lower trunk rotations 10X  Hamstring stretch with rope 30X2 each Seated: Piriformis stretch bil 2X30  07/13/24 Supine:  lower trunk rotations 10X5  Bridge 10X  SLR 10X each LE  Rt knee measurements -6 to 120 Seated:  toeraises 10X  Piriformis stretch Lt 2X30  Hamstring stretch 30 each side  MMT for hip ext/abd, quad and hamstrings 2 MWT 260 feet with SPC   07/06/24: PT Eval and HEP    PATIENT EDUCATION:  Education details: PT evaluation, objective findings, POC, Importance of HEP, Precautions, Clinic policies  Person educated: Patient Education method: Explanation and Demonstration Education comprehension: verbalized understanding and returned demonstration  HOME EXERCISE PROGRAM: Access Code: [email or web address] URL: [email or web address] Date: 07/06/2024 Prepared by:  Rosaria Powell-Butler Exercises - Supine Lower Trunk Rotation  - 2 x daily - 7 x weekly - 3 sets - 10 reps - Hooklying Hamstring Stretch with Strap  - 2 x daily - 7 x weekly - 3 sets - 10 reps - 30 hold - Prone Quadriceps Stretch with Strap  - 2 x daily - 7 x weekly - 3 sets - 10 reps - 30 hold - Seated Heel Toe Raises  - 2 x daily - 7 x weekly - 3 sets - 10 reps  Access Code: [email or web address] URL: [email or web address] Date: 07/13/2024 Prepared by: Greig Fuse Exercises - Seated Figure 4 Piriformis Stretch  - 2 x daily - 7 x weekly - 2 sets - 3 reps - 30 sec hold - Seated Table Hamstring Stretch  - 2 x daily - 7 x weekly - 2 sets - 3 reps - 30 sec hold - Supine Bridge  - 2 x daily - 7 x weekly - 2 sets - 10 reps - Active Straight Leg Raise with Quad Set  - 2 x daily - 7 x weekly - 2 sets - 10 reps  07/26/24: hip excursions/lumbar ROM 10X each direction  ASSESSMENT:  CLINICAL IMPRESSION: Pt with reported increased pain today, however rated same number as last week.  Currently on last day of prednisone  pack started last week but no benefits reported per pt.  Continued with LE strengthening and stabilization activities.  Minimal cues needed for form with standing exercises. Pt with improved activity tolerance, not having to take as many seated breaks today during session as last visit. Standing extension with positive results further instructed to complete against counter/bar for more extension.  Completed these in good form and reported comfort.  Squats added using chair behind for biofeedback.  Cues to widen LE with activity.  Finished session with nustep for mobility/activity tolerance.   No pain or issues noted during session today.  Patient will benefit from continued skilled physical therapy in order to address current deficits to improve current level of function for improved quality of life.    OBJECTIVE IMPAIRMENTS: Abnormal gait, decreased activity tolerance, decreased endurance,  decreased mobility, difficulty walking,  decreased ROM, decreased strength, hypomobility, improper body mechanics, postural dysfunction, and pain.   ACTIVITY LIMITATIONS: carrying, lifting, bending, sitting, standing, squatting, and transfers  PARTICIPATION LIMITATIONS: meal prep, cleaning, laundry, community activity, and yard work  PERSONAL FACTORS: N/A are also affecting patient's functional outcome.   REHAB POTENTIAL: Good  CLINICAL DECISION MAKING: Stable/uncomplicated  EVALUATION COMPLEXITY: Low   GOALS: Goals reviewed with patient? Yes  SHORT TERM GOALS: Target date: 07/27/24 Patient will be independent with performance of HEP to demonstrate adequate self management of symptoms.  Baseline:  Goal status: INITIAL  2.   Patient will report at least a 25% improvement with function and/or pain reduction overall since beginning PT. Baseline:  Goal status: INITIAL   LONG TERM GOALS: Target date: 08/31/24 Patient will improve LEFS score by 9 points to demonstrate improved perceived function while meeting MCID.  Baseline: Goal status: INITIAL 2.  Patient will improve  lumbar  flexion ROM to reach past knee joint with reports of pain as 5/10 or lower in L knee to demonstrate improved lumbar mobility needed for functional transfers and house chores.  Baseline:  Goal status: INITIAL 3.  Patient will score at least a  4+/5 on all LLE MMT to demonstrate increased LE strength and/or power needed to improve ambulation/gait mechanics.  Baseline:  Goal status: INITIAL   4.  Patient will increase 2 minute walk test gait distance to 40 ft with least restrictive assistive device to demonstrate improved endurance and functional mobility needed for ambulating household and community distances.  Baseline: Goal status: INITIAL; 5. Patient will improve 5 times sit to stand test by at least 3 seconds in order to demonstrate improved LE strength/power needed for functional transfers.   Baseline: Goal status: INITIAL;    PLAN:  PT FREQUENCY: 1x/week  PT DURATION: 8 weeks  PLANNED INTERVENTIONS: 97164- PT Re-evaluation, 97110-Therapeutic exercises, 97530- Therapeutic activity, 97112- Neuromuscular re-education, 97535- Self Care, [id number]- Manual therapy, [phone]- Gait training, [phone]- Electrical stimulation (manual), [id number]- Traction (mechanical), [phone] (1-2 muscles), [id number] (3+ muscles)- Dry Needling, Patient/Family education, Balance training, Stair training, Taping, Joint mobilization, Spinal mobilization, Cryotherapy, and Moist heat  PLAN FOR NEXT SESSION: Continue with lumbar mobility, LE mobility and strengthening   11:42 AM, 08/02/24 Greig KATHEE Fuse, PTA/CLT [facility] Ph: [phone]

## 2024-08-11 ENCOUNTER — Ambulatory Visit (HOSPITAL_COMMUNITY)

## 2024-08-11 ENCOUNTER — Encounter (HOSPITAL_COMMUNITY): Payer: Self-pay

## 2024-08-11 DIAGNOSIS — M6289 Other specified disorders of muscle: Secondary | ICD-10-CM

## 2024-08-11 DIAGNOSIS — M5459 Other low back pain: Secondary | ICD-10-CM | POA: Diagnosis not present

## 2024-08-11 DIAGNOSIS — Z7409 Other reduced mobility: Secondary | ICD-10-CM

## 2024-08-11 NOTE — Therapy (Signed)
 OUTPATIENT PHYSICAL THERAPY LOWER EXTREMITY TREATMENT/PN/ReCERT   Patient Name: Wendy Newman MRN: [id number] DOB:1940-12-25, 83 y.o., female Today's Date: 08/11/2024  Progress Note Reporting Period 07/06/24 to 08/11/24  See note below for Objective Data and Assessment of Progress/Goals.     END OF SESSION:  PT End of Session - 08/11/24 1041     Visit Number 5    Number of Visits 10    Date for Recertification  08/24/24    Authorization Type [employer] Dual Complete    Authorization Time Period No auth    Progress Note Due on Visit 10    PT Start Time 1036    PT Stop Time 1110    PT Time Calculation (min) 34 min    Activity Tolerance Patient tolerated treatment well    Behavior During Therapy WFL for tasks assessed/performed            Past Medical History:  Diagnosis Date   Anxiety    Cataract    DVT (deep venous thrombosis) (HCC)    Edema of both legs    Essential hypertension    Osteoporosis    PONV (postoperative nausea and vomiting)    Vitamin D  deficiency    Past Surgical History:  Procedure Laterality Date   ABDOMINAL HYSTERECTOMY     Complete   CATARACT EXTRACTION Bilateral    CESAREAN SECTION     X 3   COLONOSCOPY  10/10/2008   MFM:[email or web address]   COLONOSCOPY WITH PROPOFOL  N/A 12/21/2014   Dr. Rourk:noncompliant left colon and redundant right colon, rectal and colonic mucosa appeared normal.    TOTAL KNEE ARTHROPLASTY Right 06/16/2023   Procedure: TOTAL KNEE ARTHROPLASTY;  Surgeon: Margrette Taft BRAVO, MD;  Location: [facility];  Service: Orthopedics;  Laterality: Right;   Patient Active Problem List   Diagnosis Date Noted   Left sided sciatica 06/22/2024   Nausea & vomiting 06/17/2023   Osteoarthritis of right knee 06/16/2023   Acute deep vein thrombosis (DVT) of popliteal vein of left lower extremity (HCC) 10/02/2021   Allergic rhinoconjunctivitis 07/02/2017   Family history of colonic polyps 11/28/2014   Family history of colon cancer 11/28/2014    Constipation 11/28/2014   Vitamin D  deficiency 06/16/2013   Hypertension    Anxiety    Osteoporosis    Cataract    Hyperlipemia 05/18/2009   Anxiety state 05/18/2009   NECK PAIN 05/18/2009   ALLERGIC RHINITIS 08/28/2008   OSTEOARTHRITIS 08/28/2008   POSTMENOPAUSAL OSTEOPOROSIS 08/28/2008   PAT 04/24/2008   OTHER ABNORMAL BLOOD CHEMISTRY 04/24/2008   Essential hypertension 12/16/2007   HEADACHE 12/16/2007    PCP: Duanne Butler DASEN, MD  REFERRING PROVIDER: Kayla Jeoffrey GORMAN, FNP  REFERRING DIAG: [phone] (ICD-10-CM) - Left sided sciatica  THERAPY DIAG:  Other low back pain  Muscle tightness  Impaired functional mobility, balance, gait, and endurance  Rationale for Evaluation and Treatment: Rehabilitation  ONSET DATE: One Month  SUBJECTIVE:   SUBJECTIVE STATEMENT: Pt reports R knee bothering her today, feels tight, 8/10 in intensity. Reports she has been doing HEP. Reports back still hurts some but not as bad as it did last session.    EVAL: Patient reports R knee still feeling tight since last visit. Got an injection on August 14th. Reports she has been having sciatica for the past month in the LLE. Reports her pain is mostly traveling down the front of her leg but sometimes it travels down the back but ends at her glute muscles. Reports she does have  some left sided back pain as well. Reports catching during transfers at times. And some when walking.   PERTINENT HISTORY: 1 year post-op on Right TKA PAIN:  Are you having pain? Yes: NPRS scale: 6/10 Pain location: central lower back Pain description: ache Aggravating factors: N/A; Pain at rest, pain with motion  Relieving factors: Tylenol  helps relieve, sitting with feet propped.   PRECAUTIONS: None  RED FLAGS: None   WEIGHT BEARING RESTRICTIONS: No  FALLS:  Has patient fallen in last 6 months? No  LIVING ENVIRONMENT: Stairs: No Has following equipment at home: Single point cane when outside of  house  OCCUPATION: N/A  PLOF: Independent  PATIENT GOALS: For pain to go away  NEXT MD VISIT: Told to call if she needs him Marie)  OBJECTIVE:  Note: Objective measures were completed at Evaluation unless otherwise noted.  DIAGNOSTIC FINDINGS: N/A  PATIENT SURVEYS:  LEFS  Extreme difficulty/unable (0), Quite a bit of difficulty (1), Moderate difficulty (2), Little difficulty (3), No difficulty (4) Survey date:    07/06/24  Score total:  Lower Extremity Functional Score: 44 / 80 = 55.0 %    08/11/24: Lower Extremity Functional Score: 60 / 80 = 75.0 %   COGNITION: Overall cognitive status: Within functional limits for tasks assessed     SENSATION: WFL  POSTURE: rounded shoulders, forward head, and increased thoracic kyphosis  PALPATION: TTP at front of lower Left extremity, L quads, and L hip musculature (IT band area)   LUMBAR ROM:   AROM eval 08/11/24:  Flexion To knee, * in L knee To mid shin  Extension 10% avail, *in L side of low back 25% avail, pulling in L knee  Right lateral flexion To knee joint, in R side  To knee joint, pulling in L side   Left lateral flexion To knee joint, in L side  To knee joint, pulling in R side   Right rotation 25% avail, mid back pain 50% avail  Left rotation 25% avail  50% avail   (Blank rows = not tested)   *=pain   LOWER EXTREMITY ROM:  Active ROM Right 07/13/24 Left 07/13/24 R 08/11/24 L  08/11/24  Hip flexion 84 degrees hamstring tightness 95 degrees hamstring tightness    Hip extension      Hip abduction      Hip adduction      Hip internal rotation      Hip external rotation      Knee flexion 120  124   Knee extension -6  -2   Ankle dorsiflexion      Ankle plantarflexion      Ankle inversion      Ankle eversion       (Blank rows = not tested)  LOWER EXTREMITY MMT:  MMT Right eval Left eval R 08/11/24 L 08/11/24  Hip flexion 4 4- 4+ 4+  Hip extension 3+ 3- 3+ 3+  Hip abduction 4- 4- 4 4-  Hip  adduction      Hip internal rotation      Hip external rotation      Knee flexion 4* 5 4+ 4+  Knee extension      Ankle dorsiflexion 4- 4 5 5   Ankle plantarflexion 4+ 5    Ankle inversion      Ankle eversion       (Blank rows = not tested)   LUMBAR SPECIAL TESTS:  Straight leg raise test: Negative and Slump test: Negative Pt reports pulling/stretching sensation in  leg but no nerve-like symptoms  FUNCTIONAL TESTS:  5 times sit to stand: 24 seconds, catching in low back with first stand 2 minute walk test: 07/13/24:  260 feet with SPC    08/11/24: 5 times sit to stand: 16 seconds, no UE support, no back pain 2 minute walk test: 298 ft, no AD   GAIT: Distance walked: 100 Assistive device utilized: Single point cane Level of assistance: Modified independence Comments: Pt ambulates with SPC, dec knee flex and hip ext bilaterally (L>R), dec weight shift to L, dec step length on R                                                                                                                                TREATMENT DATE:  08/11/24: Recumbent bike, seat 10, level 3, 5' Progress Note: LEFS 5 times sit to stand 2 minute walk test LE ROM LE MMT Lumbar ROM Review of goals   08/02/24 Standing:  hip excursions 10X each direction  Hip extension 2X10 each  Hip abduction 2X10 each  Heelraises 20X on incline  Forward lunges onto 4 step 2X10  Standing extensions back against bar 10X  Squats with chair behind for biofeedback 10X Lateral stepping along 9ft line with RTB at thighs 2RT Nustep seat 7 UE/LE atl beach level 3, 5 minutes  07/26/24 Standing:  hip excursions 10X each direction  Hip extension  Hip abduction  Heelraises 20X  Toeraises 20X Supine:  lower trunk rotations 10X5  Bridge 10X  SLR 10X each LE  Lower trunk rotations 10X  Hamstring stretch with rope 30X2 each Seated: Piriformis stretch bil 2X30   PATIENT EDUCATION:  Education details: PT evaluation,  objective findings, POC, Importance of HEP, Precautions, Clinic policies  Person educated: Patient Education method: Explanation and Demonstration Education comprehension: verbalized understanding and returned demonstration  HOME EXERCISE PROGRAM: Access Code: [email or web address] URL: [email or web address] Date: 07/06/2024 Prepared by: Rosaria Powell-Butler Exercises - Supine Lower Trunk Rotation  - 2 x daily - 7 x weekly - 3 sets - 10 reps - Hooklying Hamstring Stretch with Strap  - 2 x daily - 7 x weekly - 3 sets - 10 reps - 30 hold - Prone Quadriceps Stretch with Strap  - 2 x daily - 7 x weekly - 3 sets - 10 reps - 30 hold - Seated Heel Toe Raises  - 2 x daily - 7 x weekly - 3 sets - 10 reps  Access Code: [email or web address] URL: [email or web address] Date: 07/13/2024 Prepared by: Greig Fuse Exercises - Seated Figure 4 Piriformis Stretch  - 2 x daily - 7 x weekly - 2 sets - 3 reps - 30 sec hold - Seated Table Hamstring Stretch  - 2 x daily - 7 x weekly - 2 sets - 3 reps - 30 sec hold - Supine Bridge  - 2 x daily - 7 x weekly - 2 sets - 10 reps - Active Straight Leg Raise  with Quad Set  - 2 x daily - 7 x weekly - 2 sets - 10 reps  07/26/24: hip excursions/lumbar ROM 10X each direction  ASSESSMENT:  CLINICAL IMPRESSION: Progress note performed this date. Patient demonstrates improvements with all things tested this date including self perceived function, lumbar ROM, LE strength, LE ROM, and during functional testing indicating improved gait speed and mechanics as well as LE power. Patient has met 5 of 7 goals this date. Pt limited most with tightness in R knee as pt back pain has improved.  Encouraged pt to wear loose clothing next session for manual therapy to aid in tightness. Overall, patient reports feeling 80% improvement since starting PT. Patient will benefit from continued skilled physical therapy in order to address current deficits to improve current level of function  for improved quality of life.     OBJECTIVE IMPAIRMENTS: Abnormal gait, decreased activity tolerance, decreased endurance, decreased mobility, difficulty walking, decreased ROM, decreased strength, hypomobility, improper body mechanics, postural dysfunction, and pain.   ACTIVITY LIMITATIONS: carrying, lifting, bending, sitting, standing, squatting, and transfers  PARTICIPATION LIMITATIONS: meal prep, cleaning, laundry, community activity, and yard work  PERSONAL FACTORS: N/A are also affecting patient's functional outcome.   REHAB POTENTIAL: Good  CLINICAL DECISION MAKING: Stable/uncomplicated  EVALUATION COMPLEXITY: Low   GOALS: Goals reviewed with patient? Yes  SHORT TERM GOALS: Target date: 07/27/24 Patient will be independent with performance of HEP to demonstrate adequate self management of symptoms.  Baseline: Reports every day compliance Goal status: MET  2.   Patient will report at least a 25% improvement with function and/or pain reduction overall since beginning PT. Baseline:  80% improved  Goal status: MET   LONG TERM GOALS: Target date: 08/31/24 Patient will improve LEFS score by 9 points to demonstrate improved perceived function while meeting MCID.  Baseline: Goal status: MET 2.  Patient will improve  lumbar  flexion ROM to reach past knee joint with reports of pain as 5/10 or lower in L knee to demonstrate improved lumbar mobility needed for functional transfers and house chores.  Baseline:  Goal status: MET 3.  Patient will score at least a  4+/5 on all LLE MMT to demonstrate increased LE strength and/or power needed to improve ambulation/gait mechanics.  Baseline:  Goal status: IN PROGRESS   4.  Patient will increase 2 minute walk test gait distance to 40 ft with least restrictive assistive device to demonstrate improved endurance and functional mobility needed for ambulating household and community distances.  Baseline: Goal status: IN PROGRESS 5. Patient  will improve 5 times sit to stand test by at least 3 seconds in order to demonstrate improved LE strength/power needed for functional transfers.  Baseline: Goal status: MET    PLAN:  PT FREQUENCY: 1x/week  PT DURATION: 8 weeks  PLANNED INTERVENTIONS: 97164- PT Re-evaluation, 97110-Therapeutic exercises, 97530- Therapeutic activity, [id number]- Neuromuscular re-education, 97535- Self Care, [id number]- Manual therapy, [id number]- Gait training, [phone]- Electrical stimulation (manual), [id number]- Traction (mechanical), 20560 (1-2 muscles), 20561 (3+ muscles)- Dry Needling, Patient/Family education, Balance training, Stair training, Taping, Joint mobilization, Spinal mobilization, Cryotherapy, and Moist heat  PLAN FOR NEXT SESSION: Continue with lumbar mobility, LE mobility and strengthening   11:12 AM, 08/11/24 Jun Rightmyer Powell-Butler, PT, DPT [facility] [place]

## 2024-08-18 ENCOUNTER — Ambulatory Visit (HOSPITAL_COMMUNITY)

## 2024-08-18 ENCOUNTER — Encounter (HOSPITAL_COMMUNITY): Payer: Self-pay

## 2024-08-18 DIAGNOSIS — M6289 Other specified disorders of muscle: Secondary | ICD-10-CM

## 2024-08-18 DIAGNOSIS — M5459 Other low back pain: Secondary | ICD-10-CM | POA: Diagnosis not present

## 2024-08-18 DIAGNOSIS — Z7409 Other reduced mobility: Secondary | ICD-10-CM

## 2024-08-18 NOTE — Therapy (Signed)
 OUTPATIENT PHYSICAL THERAPY LOWER EXTREMITY TREATMENT   Patient Name: Wendy Newman MRN: [id number] DOB:[birth year], 83 y.o., female Today's Date: 08/18/2024   END OF SESSION:  PT End of Session - 08/18/24 1030     Visit Number 6    Number of Visits 10    Date for Recertification  08/24/24    Authorization Type [employer] Dual Complete    Authorization Time Period No auth    Progress Note Due on Visit 10    PT Start Time 1032    PT Stop Time 1112    PT Time Calculation (min) 40 min    Activity Tolerance Patient tolerated treatment well    Behavior During Therapy WFL for tasks assessed/performed            Past Medical History:  Diagnosis Date   Anxiety    Cataract    DVT (deep venous thrombosis) (HCC)    Edema of both legs    Essential hypertension    Osteoporosis    PONV (postoperative nausea and vomiting)    Vitamin D  deficiency    Past Surgical History:  Procedure Laterality Date   ABDOMINAL HYSTERECTOMY     Complete   CATARACT EXTRACTION Bilateral    CESAREAN SECTION     X 3   COLONOSCOPY  10/10/2008   MFM:[email or web address]   COLONOSCOPY WITH PROPOFOL  N/A 12/21/2014   Dr. Rourk:noncompliant left colon and redundant right colon, rectal and colonic mucosa appeared normal.    TOTAL KNEE ARTHROPLASTY Right 06/16/2023   Procedure: TOTAL KNEE ARTHROPLASTY;  Surgeon: Margrette Taft BRAVO, MD;  Location: [facility];  Service: Orthopedics;  Laterality: Right;   Patient Active Problem List   Diagnosis Date Noted   Left sided sciatica 06/22/2024   Nausea & vomiting 06/17/2023   Osteoarthritis of right knee 06/16/2023   Acute deep vein thrombosis (DVT) of popliteal vein of left lower extremity (HCC) 10/02/2021   Allergic rhinoconjunctivitis 07/02/2017   Family history of colonic polyps 11/28/2014   Family history of colon cancer 11/28/2014   Constipation 11/28/2014   Vitamin D  deficiency 06/16/2013   Hypertension    Anxiety    Osteoporosis    Cataract    Hyperlipemia  05/18/2009   Anxiety state 05/18/2009   NECK PAIN 05/18/2009   ALLERGIC RHINITIS 08/28/2008   OSTEOARTHRITIS 08/28/2008   POSTMENOPAUSAL OSTEOPOROSIS 08/28/2008   PAT 04/24/2008   OTHER ABNORMAL BLOOD CHEMISTRY 04/24/2008   Essential hypertension 12/16/2007   HEADACHE 12/16/2007    PCP: Duanne Butler DASEN, MD  REFERRING PROVIDER: Kayla Jeoffrey GORMAN, FNP  REFERRING DIAG: [phone] (ICD-10-CM) - Left sided sciatica  THERAPY DIAG:  Other low back pain  Muscle tightness  Impaired functional mobility, balance, gait, and endurance  Rationale for Evaluation and Treatment: Rehabilitation  ONSET DATE: One Month  SUBJECTIVE:   SUBJECTIVE STATEMENT: Feeling good today, only compliant with Rt knee tightness.   EVAL: Patient reports R knee still feeling tight since last visit. Got an injection on August 14th. Reports she has been having sciatica for the past month in the LLE. Reports her pain is mostly traveling down the front of her leg but sometimes it travels down the back but ends at her glute muscles. Reports she does have some left sided back pain as well. Reports catching during transfers at times. And some when walking.   PERTINENT HISTORY: 1 year post-op on Right TKA PAIN:  Are you having pain? Yes: NPRS scale: 0/10 Pain location: central lower back Pain description: ache  Aggravating factors: N/A; Pain at rest, pain with motion  Relieving factors: Tylenol  helps relieve, sitting with feet propped.   PRECAUTIONS: None  RED FLAGS: None   WEIGHT BEARING RESTRICTIONS: No  FALLS:  Has patient fallen in last 6 months? No  LIVING ENVIRONMENT: Stairs: No Has following equipment at home: Single point cane when outside of house  OCCUPATION: N/A  PLOF: Independent  PATIENT GOALS: For pain to go away  NEXT MD VISIT: Told to call if she needs him Marie)  OBJECTIVE:  Note: Objective measures were completed at Evaluation unless otherwise noted.  DIAGNOSTIC  FINDINGS: N/A  PATIENT SURVEYS:  LEFS  Extreme difficulty/unable (0), Quite a bit of difficulty (1), Moderate difficulty (2), Little difficulty (3), No difficulty (4) Survey date:    07/06/24  Score total:  Lower Extremity Functional Score: 44 / 80 = 55.0 %    08/11/24: Lower Extremity Functional Score: 60 / 80 = 75.0 %   COGNITION: Overall cognitive status: Within functional limits for tasks assessed     SENSATION: WFL  POSTURE: rounded shoulders, forward head, and increased thoracic kyphosis  PALPATION: TTP at front of lower Left extremity, L quads, and L hip musculature (IT band area)   LUMBAR ROM:   AROM eval 08/11/24:  Flexion To knee, * in L knee To mid shin  Extension 10% avail, *in L side of low back 25% avail, pulling in L knee  Right lateral flexion To knee joint, in R side  To knee joint, pulling in L side   Left lateral flexion To knee joint, in L side  To knee joint, pulling in R side   Right rotation 25% avail, mid back pain 50% avail  Left rotation 25% avail  50% avail   (Blank rows = not tested)   *=pain   LOWER EXTREMITY ROM:  Active ROM Right 07/13/24 Left 07/13/24 R 08/11/24 L  08/11/24  Hip flexion 84 degrees hamstring tightness 95 degrees hamstring tightness    Hip extension      Hip abduction      Hip adduction      Hip internal rotation      Hip external rotation      Knee flexion 120  124   Knee extension -6  -2   Ankle dorsiflexion      Ankle plantarflexion      Ankle inversion      Ankle eversion       (Blank rows = not tested)  LOWER EXTREMITY MMT:  MMT Right eval Left eval R 08/11/24 L 08/11/24  Hip flexion 4 4- 4+ 4+  Hip extension 3+ 3- 3+ 3+  Hip abduction 4- 4- 4 4-  Hip adduction      Hip internal rotation      Hip external rotation      Knee flexion 4* 5 4+ 4+  Knee extension      Ankle dorsiflexion 4- 4 5 5   Ankle plantarflexion 4+ 5    Ankle inversion      Ankle eversion       (Blank rows = not  tested)   LUMBAR SPECIAL TESTS:  Straight leg raise test: Negative and Slump test: Negative Pt reports pulling/stretching sensation in leg but no nerve-like symptoms  FUNCTIONAL TESTS:  5 times sit to stand: 24 seconds, catching in low back with first stand 2 minute walk test: 07/13/24:  260 feet with SPC    08/11/24: 5 times sit to stand: 16 seconds, no  UE support, no back pain 2 minute walk test: 298 ft, no AD   GAIT: Distance walked: 100 Assistive device utilized: Single point cane Level of assistance: Modified independence Comments: Pt ambulates with SPC, dec knee flex and hip ext bilaterally (L>R), dec weight shift to L, dec step length on R                                                                                                                                TREATMENT DATE:  08/18/24: Recumbent bike, seat 8, level 3, 5' Knee drive 87pw step height 5x 10 Posterior lunges with BUE support in // bars 10x  Squat 10x front of chair  Manual: Retrograde massage with LE elevated Patella mobs all directions, noted initial limitation Rt/Lt that improved following manual AROM 3-128 degrees Educated benefits with compression garments Measurements taken and given handout for ETI DC due to reports of no rides available, educated on RCATS, wishes to DC and try at home.  08/11/24: Recumbent bike, seat 10, level 3, 5' Progress Note: LEFS 5 times sit to stand 2 minute walk test LE ROM LE MMT Lumbar ROM Review of goals   08/02/24 Standing:  hip excursions 10X each direction  Hip extension 2X10 each  Hip abduction 2X10 each  Heelraises 20X on incline  Forward lunges onto 4 step 2X10  Standing extensions back against bar 10X  Squats with chair behind for biofeedback 10X Lateral stepping along 46ft line with RTB at thighs 2RT Nustep seat 7 UE/LE atl beach level 3, 5 minutes   PATIENT EDUCATION:  Education details: PT evaluation, objective findings, POC,  Importance of HEP, Precautions, Clinic policies  Person educated: Patient Education method: Explanation and Demonstration Education comprehension: verbalized understanding and returned demonstration  HOME EXERCISE PROGRAM: Access Code: [email or web address] URL: [email or web address] Date: 07/06/2024 Prepared by: Rosaria Powell-Butler Exercises - Supine Lower Trunk Rotation  - 2 x daily - 7 x weekly - 3 sets - 10 reps - Hooklying Hamstring Stretch with Strap  - 2 x daily - 7 x weekly - 3 sets - 10 reps - 30 hold - Prone Quadriceps Stretch with Strap  - 2 x daily - 7 x weekly - 3 sets - 10 reps - 30 hold - Seated Heel Toe Raises  - 2 x daily - 7 x weekly - 3 sets - 10 reps  Access Code: [email or web address] URL: [email or web address] Date: 07/13/2024 Prepared by: Greig Fuse Exercises - Seated Figure 4 Piriformis Stretch  - 2 x daily - 7 x weekly - 2 sets - 3 reps - 30 sec hold - Seated Table Hamstring Stretch  - 2 x daily - 7 x weekly - 2 sets - 3 reps - 30 sec hold - Supine Bridge  - 2 x daily - 7 x weekly - 2 sets - 10 reps - Active Straight Leg Raise with Quad Set  - 2 x daily - 7 x weekly - 2  sets - 10 reps  07/26/24: hip excursions/lumbar ROM 10X each direction  08/18/24: Squat  ASSESSMENT:  CLINICAL IMPRESSION: Session focus with knee mobilty and proximal strengthening.  Added lunges and squats for functional strengthening with cueing for mechanics.  EOS with manual to address patella restrictions and edema control.  Educated on benefits with compression garments for edema control, measurements taken and given ETI handout.  Pt stated today will be last PT session as doesn't have a ride to get there.  Reviewed current exercise program with additional squats for functional gluteal strengthening.  No reports of increased pain following.  Improved AROM 3-128 degreess following manual patella mobs and edema control.   OBJECTIVE IMPAIRMENTS: Abnormal gait, decreased activity  tolerance, decreased endurance, decreased mobility, difficulty walking, decreased ROM, decreased strength, hypomobility, improper body mechanics, postural dysfunction, and pain.   ACTIVITY LIMITATIONS: carrying, lifting, bending, sitting, standing, squatting, and transfers  PARTICIPATION LIMITATIONS: meal prep, cleaning, laundry, community activity, and yard work  PERSONAL FACTORS: N/A are also affecting patient's functional outcome.   REHAB POTENTIAL: Good  CLINICAL DECISION MAKING: Stable/uncomplicated  EVALUATION COMPLEXITY: Low   GOALS: Goals reviewed with patient? Yes  SHORT TERM GOALS: Target date: 07/27/24 Patient will be independent with performance of HEP to demonstrate adequate self management of symptoms.  Baseline: Reports every day compliance Goal status: MET  2.   Patient will report at least a 25% improvement with function and/or pain reduction overall since beginning PT. Baseline:  80% improved  Goal status: MET   LONG TERM GOALS: Target date: 08/31/24 Patient will improve LEFS score by 9 points to demonstrate improved perceived function while meeting MCID.  Baseline: Goal status: MET 2.  Patient will improve  lumbar  flexion ROM to reach past knee joint with reports of pain as 5/10 or lower in L knee to demonstrate improved lumbar mobility needed for functional transfers and house chores.  Baseline:  Goal status: MET 3.  Patient will score at least a  4+/5 on all LLE MMT to demonstrate increased LE strength and/or power needed to improve ambulation/gait mechanics.  Baseline:  Goal status: IN PROGRESS   4.  Patient will increase 2 minute walk test gait distance to 40 ft with least restrictive assistive device to demonstrate improved endurance and functional mobility needed for ambulating household and community distances.  Baseline: Goal status: IN PROGRESS 5. Patient will improve 5 times sit to stand test by at least 3 seconds in order to demonstrate improved  LE strength/power needed for functional transfers.  Baseline: Goal status: MET    PLAN:  PT FREQUENCY: 1x/week  PT DURATION: 8 weeks  PLANNED INTERVENTIONS: 97164- PT Re-evaluation, 97110-Therapeutic exercises, 97530- Therapeutic activity, [id number]- Neuromuscular re-education, 97535- Self Care, 02859- Manual therapy, [id number]- Gait training, [phone]- Electrical stimulation (manual), [id number]- Traction (mechanical), 20560 (1-2 muscles), 20561 (3+ muscles)- Dry Needling, Patient/Family education, Balance training, Stair training, Taping, Joint mobilization, Spinal mobilization, Cryotherapy, and Moist heat  PLAN FOR NEXT SESSION: DC to HEP per pt request with limited transportation.  Augustin Mclean, LPTA/CLT; CBIS [phone]  2:00 PM, 08/18/24

## 2024-08-25 ENCOUNTER — Encounter (HOSPITAL_COMMUNITY)

## 2024-09-01 ENCOUNTER — Encounter (HOSPITAL_COMMUNITY)

## 2024-09-08 ENCOUNTER — Encounter (HOSPITAL_COMMUNITY)

## 2024-09-13 ENCOUNTER — Other Ambulatory Visit: Payer: Self-pay | Admitting: Family Medicine

## 2024-09-13 DIAGNOSIS — I10 Essential (primary) hypertension: Secondary | ICD-10-CM

## 2024-09-16 NOTE — Telephone Encounter (Signed)
 Requested Prescriptions  Refused Prescriptions Disp Refills   hydrochlorothiazide  (HYDRODIURIL ) 25 MG tablet [Pharmacy Med Name: hydroCHLOROthiazide  25 MG Oral Tablet] 100 tablet 2    Sig: TAKE 1 TABLET BY MOUTH DAILY     There is no refill protocol information for this order

## 2024-10-13 ENCOUNTER — Ambulatory Visit

## 2024-10-13 DIAGNOSIS — Z0000 Encounter for general adult medical examination without abnormal findings: Secondary | ICD-10-CM | POA: Diagnosis not present

## 2024-10-13 NOTE — Patient Instructions (Signed)
 [name],  Thank you for taking the time for your Medicare Wellness Visit. I appreciate your continued commitment to your health goals. Please review the care plan we discussed, and feel free to reach out if I can assist you further.  Please note that Annual Wellness Visits do not include a physical exam. Some assessments may be limited, especially if the visit was conducted virtually. If needed, we may recommend an in-person follow-up with your provider.  Ongoing Care Seeing your primary care provider every 3 to 6 months helps us  monitor your health and provide consistent, personalized care.   Referrals If a referral was made during today's visit and you haven't received any updates within two weeks, please contact the referred provider directly to check on the status.  Recommended Screenings:  Health Maintenance  Topic Date Due   DTaP/Tdap/Td vaccine (1 - Tdap) Never done   Zoster (Shingles) Vaccine (2 of 2) 03/10/2022   COVID-19 Vaccine (4 - 2025-26 season) 06/27/2024   Flu Shot  01/24/2025*   Medicare Annual Wellness Visit  10/13/2025   Pneumococcal Vaccine for age over 62  Completed   Osteoporosis screening with Bone Density Scan  Completed   Meningitis B Vaccine  Aged Out  *Topic was postponed. The date shown is not the original due date.       10/13/2024    9:01 AM  Advanced Directives  Does Patient Have a Medical Advance Directive? Yes  Type of Advance Directive Healthcare Power of Attorney  Copy of Healthcare Power of Attorney in Chart? No - copy requested    Vision: Annual vision screenings are recommended for early detection of glaucoma, cataracts, and diabetic retinopathy. These exams can also reveal signs of chronic conditions such as diabetes and high blood pressure.  Dental: Annual dental screenings help detect early signs of oral cancer, gum disease, and other conditions linked to overall health, including heart disease and diabetes.  Please see the attached  documents for additional preventive care recommendations.     [name] , Thank you for taking time to come for your Medicare Wellness Visit. I appreciate your ongoing commitment to your health goals. Please review the following plan we discussed and let me know if I can assist you in the future.   Screening recommendations/referrals: Colonoscopy:  Mammogram:  Bone Density:  Recommended yearly ophthalmology/optometry visit for glaucoma screening and checkup Recommended yearly dental visit for hygiene and checkup  Vaccinations: Influenza vaccine:  Pneumococcal vaccine:  Tdap vaccine:  Shingles vaccine:        Preventive Care 65 Years and Older, Female Preventive care refers to lifestyle choices and visits with your health care provider that can promote health and wellness. What does preventive care include? A yearly physical exam. This is also called an annual well check. Dental exams once or twice a year. Routine eye exams. Ask your health care provider how often you should have your eyes checked. Personal lifestyle choices, including: Daily care of your teeth and gums. Regular physical activity. Eating a healthy diet. Avoiding tobacco and drug use. Limiting alcohol use. Practicing safe sex. Taking low-dose aspirin  every day. Taking vitamin and mineral supplements as recommended by your health care provider. What happens during an annual well check? The services and screenings done by your health care provider during your annual well check will depend on your age, overall health, lifestyle risk factors, and family history of disease. Counseling  Your health care provider may ask you questions about your: Alcohol use.  Tobacco use. Drug use. Emotional well-being. Home and relationship well-being. Sexual activity. Eating habits. History of falls. Memory and ability to understand (cognition). Work and work astronomer. Reproductive health. Screening  You may have the  following tests or measurements: Height, weight, and BMI. Blood pressure. Lipid and cholesterol levels. These may be checked every 5 years, or more frequently if you are over 36 years old. Skin check. Lung cancer screening. You may have this screening every year starting at age 24 if you have a 30-pack-year history of smoking and currently smoke or have quit within the past 15 years. Fecal occult blood test (FOBT) of the stool. You may have this test every year starting at age 69. Flexible sigmoidoscopy or colonoscopy. You may have a sigmoidoscopy every 5 years or a colonoscopy every 10 years starting at age 59. Hepatitis C blood test. Hepatitis B blood test. Sexually transmitted disease (STD) testing. Diabetes screening. This is done by checking your blood sugar (glucose) after you have not eaten for a while (fasting). You may have this done every 1-3 years. Bone density scan. This is done to screen for osteoporosis. You may have this done starting at age 86. Mammogram. This may be done every 1-2 years. Talk to your health care provider about how often you should have regular mammograms. Talk with your health care provider about your test results, treatment options, and if necessary, the need for more tests. Vaccines  Your health care provider may recommend certain vaccines, such as: Influenza vaccine. This is recommended every year. Tetanus, diphtheria, and acellular pertussis (Tdap, Td) vaccine. You may need a Td booster every 10 years. Zoster vaccine. You may need this after age 16. Pneumococcal 13-valent conjugate (PCV13) vaccine. One dose is recommended after age 77. Pneumococcal polysaccharide (PPSV23) vaccine. One dose is recommended after age 9. Talk to your health care provider about which screenings and vaccines you need and how often you need them. This information is not intended to replace advice given to you by your health care provider. Make sure you discuss any questions you  have with your health care provider. Document Released: 11/09/2015 Document Revised: 07/02/2016 Document Reviewed: 08/14/2015 Elsevier Interactive Patient Education  2017 [employer].  Fall Prevention in the Home Falls can cause injuries. They can happen to people of all ages. There are many things you can do to make your home safe and to help prevent falls. What can I do on the outside of my home? Regularly fix the edges of walkways and driveways and fix any cracks. Remove anything that might make you trip as you walk through a door, such as a raised step or threshold. Trim any bushes or trees on the path to your home. Use bright outdoor lighting. Clear any walking paths of anything that might make someone trip, such as rocks or tools. Regularly check to see if handrails are loose or broken. Make sure that both sides of any steps have handrails. Any raised decks and porches should have guardrails on the edges. Have any leaves, snow, or ice cleared regularly. Use sand or salt on walking paths during winter. Clean up any spills in your garage right away. This includes oil or grease spills. What can I do in the bathroom? Use night lights. Install grab bars by the toilet and in the tub and shower. Do not use towel bars as grab bars. Use non-skid mats or decals in the tub or shower. If you need to sit down in the shower, use  a plastic, non-slip stool. Keep the floor dry. Clean up any water  that spills on the floor as soon as it happens. Remove soap buildup in the tub or shower regularly. Attach bath mats securely with double-sided non-slip rug tape. Do not have throw rugs and other things on the floor that can make you trip. What can I do in the bedroom? Use night lights. Make sure that you have a light by your bed that is easy to reach. Do not use any sheets or blankets that are too big for your bed. They should not hang down onto the floor. Have a firm chair that has side arms. You can  use this for support while you get dressed. Do not have throw rugs and other things on the floor that can make you trip. What can I do in the kitchen? Clean up any spills right away. Avoid walking on wet floors. Keep items that you use a lot in easy-to-reach places. If you need to reach something above you, use a strong step stool that has a grab bar. Keep electrical cords out of the way. Do not use floor polish or wax that makes floors slippery. If you must use wax, use non-skid floor wax. Do not have throw rugs and other things on the floor that can make you trip. What can I do with my stairs? Do not leave any items on the stairs. Make sure that there are handrails on both sides of the stairs and use them. Fix handrails that are broken or loose. Make sure that handrails are as long as the stairways. Check any carpeting to make sure that it is firmly attached to the stairs. Fix any carpet that is loose or worn. Avoid having throw rugs at the top or bottom of the stairs. If you do have throw rugs, attach them to the floor with carpet tape. Make sure that you have a light switch at the top of the stairs and the bottom of the stairs. If you do not have them, ask someone to add them for you. What else can I do to help prevent falls? Wear shoes that: Do not have high heels. Have rubber bottoms. Are comfortable and fit you well. Are closed at the toe. Do not wear sandals. If you use a stepladder: Make sure that it is fully opened. Do not climb a closed stepladder. Make sure that both sides of the stepladder are locked into place. Ask someone to hold it for you, if possible. Clearly mark and make sure that you can see: Any grab bars or handrails. First and last steps. Where the edge of each step is. Use tools that help you move around (mobility aids) if they are needed. These include: Canes. Walkers. Scooters. Crutches. Turn on the lights when you go into a dark area. Replace any light  bulbs as soon as they burn out. Set up your furniture so you have a clear path. Avoid moving your furniture around. If any of your floors are uneven, fix them. If there are any pets around you, be aware of where they are. Review your medicines with your doctor. Some medicines can make you feel dizzy. This can increase your chance of falling. Ask your doctor what other things that you can do to help prevent falls. This information is not intended to replace advice given to you by your health care provider. Make sure you discuss any questions you have with your health care provider. Document Released: 08/09/2009 Document Revised:  03/20/2016 Document Reviewed: 11/17/2014 Elsevier Interactive Patient Education  2017 [employer].

## 2024-10-13 NOTE — Progress Notes (Signed)
 Chief Complaint  Patient presents with   Medicare Wellness     Subjective:   Wendy Newman is a 83 y.o. female who presents for a Medicare Annual Wellness Visit.  No voiced or noted concerns at this time   Visit info / Clinical Intake: Medicare Wellness Visit Type:: Subsequent Annual Wellness Visit Persons participating in visit and providing information:: patient Medicare Wellness Visit Mode:: Telephone If telephone:: video declined Since this visit was completed virtually, some vitals may be partially provided or unavailable. Missing vitals are due to the limitations of the virtual format.: Unable to obtain vitals - no equipment If Telephone or Video please confirm:: I connected with patient using audio/video enable telemedicine. I verified patient identity with two identifiers, discussed telehealth limitations, and patient agreed to proceed. Patient Location:: home Provider Location:: home Interpreter Needed?: No Pre-visit prep was completed: no AWV questionnaire completed by patient prior to visit?: no Living arrangements:: (!) lives alone Patient's Overall Health Status Rating: good Typical amount of pain: some Does pain affect daily life?: no Are you currently prescribed opioids?: no  Dietary Habits and Nutritional Risks How many meals a day?: 3 Eats fruit and vegetables daily?: yes Most meals are obtained by: preparing own meals In the last 2 weeks, have you had any of the following?: none Diabetic:: no  Functional Status Activities of Daily Living (to include ambulation/medication): Independent Ambulation: Independent with device- listed below Home Assistive Devices/Equipment: Cane Medication Administration: Independent Home Management (perform basic housework or laundry): Independent Manage your own finances?: yes Primary transportation is: facility / other Concerns about hearing?: no  Fall Screening Falls in the past year?: 1 Number of falls in past  year: 0 Was there an injury with Fall?: 0 Fall Risk Category Calculator: 1 Patient Fall Risk Level: Low Fall Risk  Fall Risk Patient at Risk for Falls Due to: Impaired balance/gait Fall risk Follow up: Falls evaluation completed; Education provided; Falls prevention discussed  Home and Transportation Safety: All rugs have non-skid backing?: (!) no All stairs or steps have railings?: N/A, no stairs Grab bars in the bathtub or shower?: yes Have non-skid surface in bathtub or shower?: (!) no Good home lighting?: yes Regular seat belt use?: yes Hospital stays in the last year:: no  Cognitive Assessment Difficulty concentrating, remembering, or making decisions? : no Will 6CIT or Mini Cog be Completed: yes What year is it?: 0 points What month is it?: 0 points Give patient an address phrase to remember (5 components): its very sunny outside today in December About what time is it?: 0 points Count backwards from 20 to 1: 0 points Say the months of the year in reverse: 0 points Repeat the address phrase from earlier: 2 points 6 CIT Score: 2 points  Advance Directives (For Healthcare) Does Patient Have a Medical Advance Directive?: Yes Does patient want to make changes to medical advance directive?: Yes (MAU/Ambulatory/Procedural Areas - Information given) Type of Advance Directive: Healthcare Power of Attorney Copy of Healthcare Power of Attorney in Chart?: No - copy requested Would patient like information on creating a medical advance directive?: No - Patient declined  Reviewed/Updated  Reviewed/Updated: Reviewed All (Medical, Surgical, Family, Medications, Allergies, Care Teams, Patient Goals); Surgical History; Family History; Medications; Allergies; Care Teams; Patient Goals; Medical History    Allergies (verified) Patient has no known allergies.   Current Medications (verified) Outpatient Encounter Medications as of 10/13/2024  Medication Sig   atorvastatin  (LIPITOR) 20  MG tablet TAKE 1 TABLET BY  MOUTH ONCE  DAILY   bisacodyl  (DULCOLAX) 5 MG EC tablet Take 1 tablet (5 mg total) by mouth daily as needed for moderate constipation.   CALCIUM -MAGNESIUM -ZINC -VIT D3 PO Take 1 tablet by mouth daily.   citalopram  (CELEXA ) 20 MG tablet TAKE 1 TABLET BY MOUTH DAILY   fluticasone  (FLONASE ) 50 MCG/ACT nasal spray Place 2 sprays into both nostrils daily.   hydrochlorothiazide  (HYDRODIURIL ) 25 MG tablet Take 1 tablet (25 mg total) by mouth daily.   levocetirizine (XYZAL ) 5 MG tablet TAKE 1 TABLET BY MOUTH DAILY IN  THE EVENING   meloxicam  (MOBIC ) 15 MG tablet TAKE 1 TABLET BY MOUTH DAILY   naproxen  (NAPROSYN ) 500 MG tablet TAKE 1 TABLET BY MOUTH TWICE  DAILY WITH A MEAL   polyethylene glycol (MIRALAX ) 17 g packet Take 17 g by mouth daily.   predniSONE  (DELTASONE ) 20 MG tablet 3 tabs poqday 1-2, 2 tabs poqday 3-4, 1 tab poqday 5-6, hold meloxicam  while taking   predniSONE  (DELTASONE ) 20 MG tablet 3 tabs poqday 1-2, 2 tabs poqday 3-4, 1 tab poqday 5-6   No facility-administered encounter medications on file as of 10/13/2024.    History: Past Medical History:  Diagnosis Date   Anxiety    Cataract    DVT (deep venous thrombosis) (HCC)    Edema of both legs    Essential hypertension    Osteoporosis    PONV (postoperative nausea and vomiting)    Vitamin D  deficiency    Past Surgical History:  Procedure Laterality Date   ABDOMINAL HYSTERECTOMY     Complete   CATARACT EXTRACTION Bilateral    CESAREAN SECTION     X 3   COLONOSCOPY  10/10/2008   MFM:[id number]   COLONOSCOPY WITH PROPOFOL  N/A 12/21/2014   Dr. Rourk:noncompliant left colon and redundant right colon, rectal and colonic mucosa appeared normal.    TOTAL KNEE ARTHROPLASTY Right 06/16/2023   Procedure: TOTAL KNEE ARTHROPLASTY;  Surgeon: Margrette Taft BRAVO, MD;  Location: [facility];  Service: Orthopedics;  Laterality: Right;   Family History  Problem Relation Age of Onset   Cancer Mother 26       Breast.  age 52.    Colon polyps Mother    Heart disease Father 78       CABG   Cancer Sister 11       Lung   Cancer Brother 44       Started in the back   Colon cancer Other        Age 26s. 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  Tobacco Use   Smoking status: Never   Smokeless tobacco: Never  Vaping Use   Vaping status: Never Used  Substance and Sexual Activity   Alcohol use: No    Alcohol/week: 0.0 standard drinks of alcohol   Drug use: No   Sexual activity: Yes    Birth control/protection: Surgical   Tobacco Counseling Counseling given: Not Answered  SDOH Screenings   Food Insecurity: No Food Insecurity (10/13/2024)  Housing: Unknown (10/13/2024)  Transportation Needs: No Transportation Needs (10/13/2024)  Utilities: Not At Risk (10/13/2024)  Alcohol Screen: Low Risk (10/08/2023)  Depression (PHQ2-9): Low Risk (10/13/2024)  Financial Resource Strain: Low Risk (10/08/2023)  Physical Activity: Inactive (10/13/2024)  Social Connections: Moderately Isolated (10/13/2024)  Stress: No Stress Concern Present (10/13/2024)  Tobacco Use: Low Risk (10/13/2024)  Health Literacy: Adequate Health Literacy (10/13/2024)   See flowsheets for full  screening details  Depression Screen PHQ 2 & 9 Depression Scale- Over the past 2 weeks, how often have you been bothered by any of the following problems? Little interest or pleasure in doing things: 0 Feeling down, depressed, or hopeless (PHQ Adolescent also includes...irritable): 0 PHQ-2 Total Score: 0 Trouble falling or staying asleep, or sleeping too much: 0 Feeling tired or having little energy: 0 Poor appetite or overeating (PHQ Adolescent also includes...weight loss): 0 Feeling bad about yourself - or that you are a failure or have let yourself or your family down: 0 Trouble concentrating on things, such as reading the newspaper or watching television (PHQ Adolescent  also includes...like school work): 0 Moving or speaking so slowly that other people could have noticed. Or the opposite - being so fidgety or restless that you have been moving around a lot more than usual: 0 PHQ-9 Total Score: 0 If you checked off any problems, how difficult have these problems made it for you to do your work, take care of things at home, or get along with other people?: Not difficult at all     Goals Addressed             This Visit's Progress    Patient Stated       For knee to get better             Objective:    There were no vitals filed for this visit. There is no height or weight on file to calculate BMI.  Hearing/Vision screen Hearing Screening - Comments:: No trouble hearing Vision Screening - Comments:: [facility] Not up to date Immunizations and Health Maintenance Health Maintenance  Topic Date Due   DTaP/Tdap/Td (1 - Tdap) Never done   Zoster Vaccines- Shingrix (2 of 2) 03/10/2022   COVID-19 Vaccine (4 - 2025-26 season) 06/27/2024   Influenza Vaccine  01/24/2025 (Originally 05/27/2024)   Medicare Annual Wellness (AWV)  10/13/2025   Pneumococcal Vaccine: 50+ Years  Completed   Bone Density Scan  Completed   Meningococcal B Vaccine  Aged Out        Assessment/Plan:  This is a routine wellness examination for Belk.  Patient Care Team: Duanne Butler DASEN, MD as PCP - General (Family Medicine) Harvey Margo CROME, MD (Inactive) as Consulting Physician (Gastroenterology)  I have personally reviewed and noted the following in the patients chart:   Medical and social history Use of alcohol, tobacco or illicit drugs  Current medications and supplements including opioid prescriptions. Functional ability and status Nutritional status Physical activity Advanced directives List of other physicians Hospitalizations, surgeries, and ER visits in previous 12 months Vitals Screenings to include cognitive, depression, and falls Referrals and  appointments  No orders of the defined types were placed in this encounter.  In addition, I have reviewed and discussed with patient certain preventive protocols, quality metrics, and best practice recommendations. A written personalized care plan for preventive services as well as general preventive health recommendations were provided to patient.   Rhealynn Myhre, LPN   [id number]/81/7974   Return in 1 year (on 10/13/2025).  After Visit Summary: (MyChart) Due to this being a telephonic visit, the after visit summary with patients personalized plan was offered to patient via MyChart   Nurse Notes:

## 2024-11-14 ENCOUNTER — Other Ambulatory Visit: Payer: Self-pay | Admitting: Family Medicine

## 2024-11-14 DIAGNOSIS — I10 Essential (primary) hypertension: Secondary | ICD-10-CM

## 2024-11-15 NOTE — Telephone Encounter (Signed)
 Requested Prescriptions  Pending Prescriptions Disp Refills   meloxicam  (MOBIC ) 15 MG tablet [Pharmacy Med Name: Meloxicam  15 MG Oral Tablet] 100 tablet 0    Sig: TAKE 1 TABLET BY MOUTH DAILY     Analgesics:  COX2 Inhibitors Failed - 11/15/2024  2:33 PM      Failed - Manual Review: Labs are only required if the patient has taken medication for more than 8 weeks.      Passed - HGB in normal range and within 360 days    Hemoglobin  Date Value Ref Range Status  05/29/2024 12.8 12.0 - 15.0 g/dL Final         Passed - Cr in normal range and within 360 days    Creat  Date Value Ref Range Status  01/25/2024 0.84 0.60 - 0.95 mg/dL Final   Creatinine, Ser  Date Value Ref Range Status  05/29/2024 0.91 0.44 - 1.00 mg/dL Final         Passed - HCT in normal range and within 360 days    HCT  Date Value Ref Range Status  05/29/2024 40.0 36.0 - 46.0 % Final         Passed - AST in normal range and within 360 days    AST  Date Value Ref Range Status  01/25/2024 16 10 - 35 U/L Final         Passed - ALT in normal range and within 360 days    ALT  Date Value Ref Range Status  01/25/2024 9 6 - 29 U/L Final         Passed - eGFR is 30 or above and within 360 days    GFR, Est African American  Date Value Ref Range Status  12/14/2020 68 > OR = 60 mL/min/1.35m2 Final   GFR, Est Non African American  Date Value Ref Range Status  12/14/2020 59 (L) > OR = 60 mL/min/1.87m2 Final   GFR, Estimated  Date Value Ref Range Status  05/29/2024 >60 >60 mL/min Final    Comment:    (NOTE) Calculated using the CKD-EPI Creatinine Equation (2021)    eGFR  Date Value Ref Range Status  07/06/2023 63 > OR = 60 mL/min/1.38m2 Final         Passed - Patient is not pregnant      Passed - Valid encounter within last 12 months    Recent Outpatient Visits           3 months ago Left sided sciatica   [place] [facility] Pickard, Butler DASEN, MD   4 months ago Left sided sciatica    [facility] Kayla Jeoffrey RAMAN, FNP   9 months ago Benign essential HTN   [place] [facility] Pickard, Butler DASEN, MD   1 year ago History of total right knee replacement   [place] [facility] Duanne, Butler DASEN, MD   1 year ago Essential hypertension   [place] [facility] Pickard, Butler DASEN, MD               hydrochlorothiazide  (HYDRODIURIL ) 25 MG tablet [Pharmacy Med Name: hydroCHLOROthiazide  25 MG Oral Tablet] 90 tablet 0    Sig: TAKE 1 TABLET BY MOUTH DAILY     Cardiovascular: Diuretics - Thiazide Failed - 11/15/2024  2:33 PM      Failed - Last BP in normal range    BP Readings from Last 1  Encounters:  07/26/24 (!) 156/82         Passed - Cr in normal range and within 180 days    Creat  Date Value Ref Range Status  01/25/2024 0.84 0.60 - 0.95 mg/dL Final   Creatinine, Ser  Date Value Ref Range Status  05/29/2024 0.91 0.44 - 1.00 mg/dL Final         Passed - K in normal range and within 180 days    Potassium  Date Value Ref Range Status  05/29/2024 3.8 3.5 - 5.1 mmol/L Final         Passed - Na in normal range and within 180 days    Sodium  Date Value Ref Range Status  05/29/2024 141 135 - 145 mmol/L Final         Passed - Valid encounter within last 6 months    Recent Outpatient Visits           3 months ago Left sided sciatica   [place] [facility] Duanne Butler DASEN, MD   4 months ago Left sided sciatica   [place] [facility] Kayla Jeoffrey RAMAN, FNP   9 months ago Benign essential HTN   [place] [facility] Pickard, Butler DASEN, MD   1 year ago History of total right knee replacement   [place] [facility] Duanne Butler DASEN, MD   1 year ago Essential hypertension   [place] [facility] Pickard, Butler DASEN, MD

## 2024-11-18 ENCOUNTER — Other Ambulatory Visit: Payer: Self-pay | Admitting: Family Medicine

## 2024-11-18 DIAGNOSIS — F411 Generalized anxiety disorder: Secondary | ICD-10-CM

## 2024-11-18 NOTE — Telephone Encounter (Signed)
 Requested medication (s) are due for refill today - unsure  Requested medication (s) are on the active medication list -yes  Future visit scheduled -no  Last refill: 02/29/24 #90  Notes to clinic: unsure if current with this Rx- has Rx Meloxicam    Requested Prescriptions  Pending Prescriptions Disp Refills   citalopram  (CELEXA ) 20 MG tablet [Pharmacy Med Name: Citalopram  Hydrobromide 20 MG Oral Tablet] 90 tablet 3    Sig: TAKE 1 TABLET BY MOUTH DAILY     Psychiatry:  Antidepressants - SSRI Passed - 11/18/2024  3:31 PM      Passed - Valid encounter within last 6 months    Recent Outpatient Visits           3 months ago Left sided sciatica   [place] [facility] Duanne, Butler DASEN, MD   4 months ago Left sided sciatica   [place] [facility] Kayla Jeoffrey RAMAN, FNP   9 months ago Benign essential HTN   [place] [facility] Pickard, Butler DASEN, MD   1 year ago History of total right knee replacement   [place] [facility] Duanne, Butler DASEN, MD   1 year ago Essential hypertension   [place] [facility] Pickard, Butler DASEN, MD                 Requested Prescriptions  Pending Prescriptions Disp Refills   citalopram  (CELEXA ) 20 MG tablet [Pharmacy Med Name: Citalopram  Hydrobromide 20 MG Oral Tablet] 90 tablet 3    Sig: TAKE 1 TABLET BY MOUTH DAILY     Psychiatry:  Antidepressants - SSRI Passed - 11/18/2024  3:31 PM      Passed - Valid encounter within last 6 months    Recent Outpatient Visits           3 months ago Left sided sciatica   [place] [facility] Duanne Butler DASEN, MD   4 months ago Left sided sciatica   [place] [facility] Kayla Jeoffrey RAMAN, FNP   9 months ago Benign essential HTN   [place] [facility] Duanne, Butler DASEN, MD   1 year ago History of total right knee replacement   [place] [facility] Duanne, Butler DASEN, MD   1 year ago Essential hypertension   [place] [facility] Pickard, Butler DASEN, MD
